# Patient Record
Sex: Female | Born: 1955
Health system: Southern US, Community
[De-identification: ages and names within clinical notes are randomized; demographics above are authoritative.]

## PROBLEM LIST (undated history)

## (undated) DIAGNOSIS — I639 Cerebral infarction, unspecified: Secondary | ICD-10-CM

## (undated) DIAGNOSIS — E119 Type 2 diabetes mellitus without complications: Secondary | ICD-10-CM

## (undated) DIAGNOSIS — R42 Dizziness and giddiness: Secondary | ICD-10-CM

## (undated) DIAGNOSIS — E785 Hyperlipidemia, unspecified: Secondary | ICD-10-CM

## (undated) DIAGNOSIS — I10 Essential (primary) hypertension: Secondary | ICD-10-CM

## (undated) DIAGNOSIS — J45909 Unspecified asthma, uncomplicated: Secondary | ICD-10-CM

## (undated) DIAGNOSIS — I671 Cerebral aneurysm, nonruptured: Secondary | ICD-10-CM

## (undated) HISTORY — DX: Type 2 diabetes mellitus without complications: E11.9

## (undated) HISTORY — PX: TUBAL LIGATION: SHX77

## (undated) HISTORY — PX: KNEE SURGERY: SHX244

## (undated) HISTORY — PX: TONSILLECTOMY AND ADENOIDECTOMY: SHX28

## (undated) HISTORY — PX: TONSILLECTOMY: SUR1361

## (undated) HISTORY — DX: Essential (primary) hypertension: I10

## (undated) HISTORY — DX: Cerebral aneurysm, nonruptured: I67.1

---

## 1998-02-22 ENCOUNTER — Emergency Department (HOSPITAL_COMMUNITY): Admission: EM | Admit: 1998-02-22 | Discharge: 1998-02-22 | Payer: Self-pay | Admitting: Emergency Medicine

## 1998-09-05 ENCOUNTER — Emergency Department (HOSPITAL_COMMUNITY): Admission: EM | Admit: 1998-09-05 | Discharge: 1998-09-05 | Payer: Self-pay | Admitting: Emergency Medicine

## 1999-07-18 ENCOUNTER — Emergency Department (HOSPITAL_COMMUNITY): Admission: EM | Admit: 1999-07-18 | Discharge: 1999-07-19 | Payer: Self-pay | Admitting: Emergency Medicine

## 1999-11-29 ENCOUNTER — Emergency Department (HOSPITAL_COMMUNITY): Admission: EM | Admit: 1999-11-29 | Discharge: 1999-11-29 | Payer: Self-pay | Admitting: Emergency Medicine

## 2002-08-02 LAB — HM PAP SMEAR: HM PAP: NORMAL

## 2005-01-12 ENCOUNTER — Other Ambulatory Visit: Payer: Self-pay

## 2005-03-07 ENCOUNTER — Ambulatory Visit: Payer: Self-pay | Admitting: General Practice

## 2006-06-03 ENCOUNTER — Emergency Department: Payer: Self-pay | Admitting: Emergency Medicine

## 2006-10-20 ENCOUNTER — Emergency Department: Payer: Self-pay | Admitting: Emergency Medicine

## 2006-10-21 ENCOUNTER — Inpatient Hospital Stay: Payer: Self-pay | Admitting: Internal Medicine

## 2007-02-01 ENCOUNTER — Emergency Department: Payer: Self-pay | Admitting: Internal Medicine

## 2007-03-06 ENCOUNTER — Ambulatory Visit: Payer: Self-pay | Admitting: Family Medicine

## 2007-04-15 ENCOUNTER — Emergency Department: Payer: Self-pay | Admitting: Emergency Medicine

## 2007-04-16 ENCOUNTER — Other Ambulatory Visit: Payer: Self-pay

## 2007-04-28 DIAGNOSIS — I671 Cerebral aneurysm, nonruptured: Secondary | ICD-10-CM

## 2007-04-28 HISTORY — PX: CRANIOTOMY: SHX93

## 2007-04-28 HISTORY — DX: Cerebral aneurysm, nonruptured: I67.1

## 2008-05-01 IMAGING — CR DG CHEST 2V
1 series · 2 of 2 positions shown · non-contrast
Comparison: none

REASON FOR EXAM: fever
COMMENTS:

[Series 1: view not recorded · 0.17mm/px · 2 of 2 slices shown]
[im 1/2]
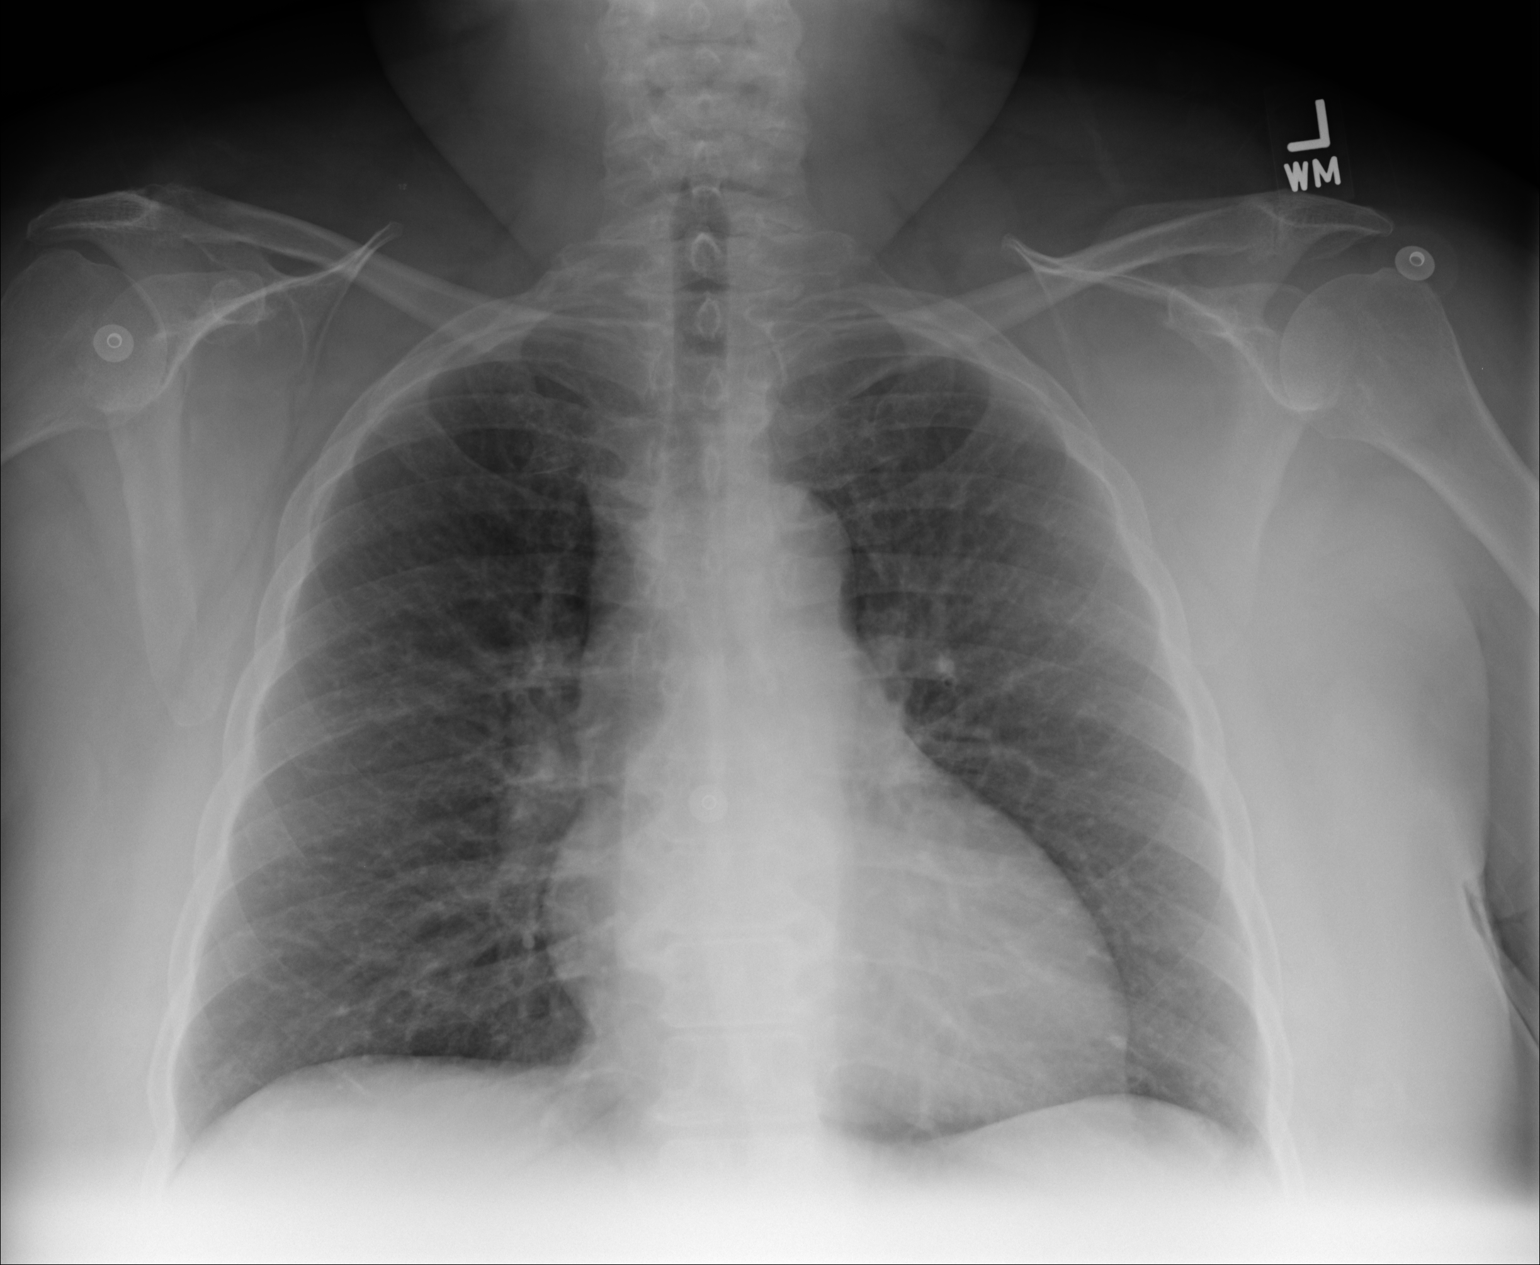
[im 2/2]
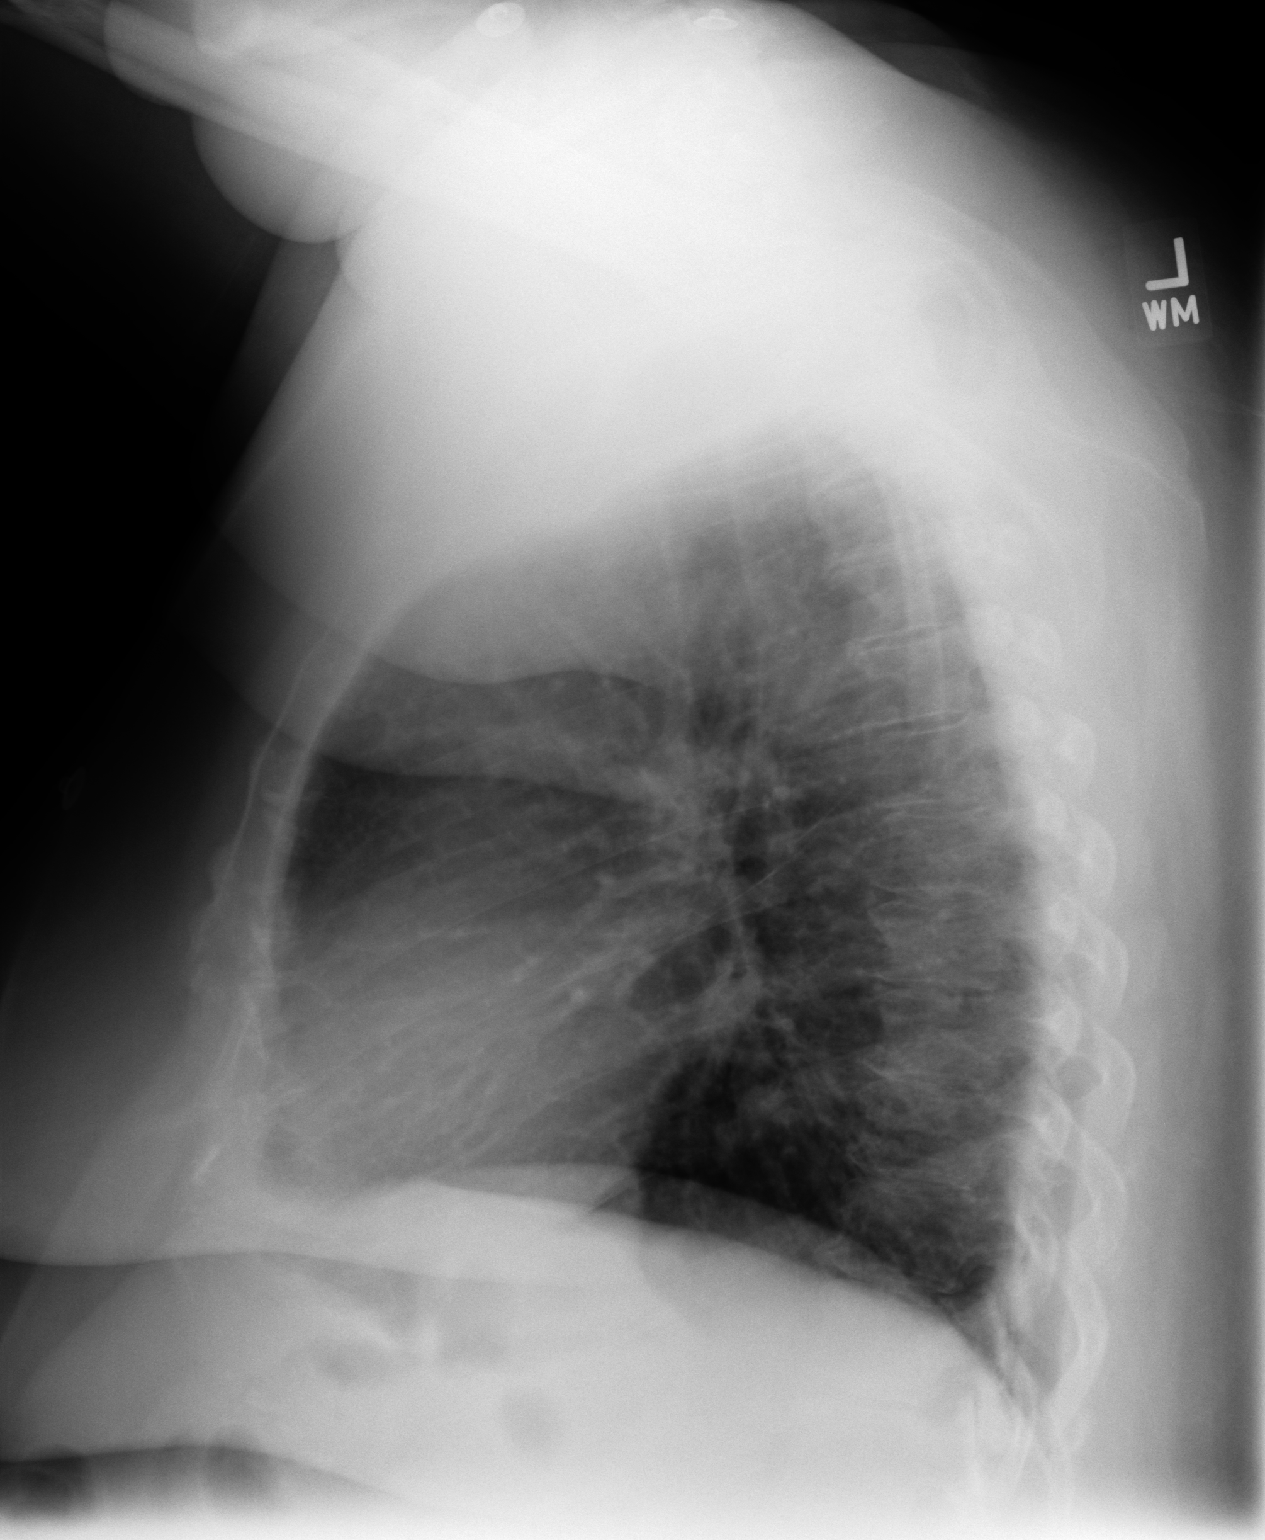

[2 of 2 positions shown; findings below may reference images not displayed]

PROCEDURE:     DXR - DXR CHEST PA (OR AP) AND LATERAL  - February 01, 2007  [DATE]

RESULT:     Comparison is made to a prior exam of 03/04/2005. The lung fields
are clear.  No pneumonia, pneumothorax or pleural effusion is seen.  Heart
size is within normal limits. No acute bony abnormalities are seen.
IMPRESSION: No acute changes are identified.

## 2008-09-12 ENCOUNTER — Ambulatory Visit: Payer: Self-pay | Admitting: Family Medicine

## 2010-01-29 LAB — HM MAMMOGRAPHY: HM Mammogram: NEGATIVE

## 2010-02-25 ENCOUNTER — Emergency Department: Payer: Self-pay | Admitting: Internal Medicine

## 2011-06-05 ENCOUNTER — Emergency Department: Payer: Self-pay | Admitting: Emergency Medicine

## 2011-09-14 LAB — HM DIABETES EYE EXAM

## 2013-01-18 ENCOUNTER — Ambulatory Visit: Payer: Self-pay | Admitting: Family Medicine

## 2013-01-22 ENCOUNTER — Emergency Department: Payer: Self-pay | Admitting: Emergency Medicine

## 2013-10-28 ENCOUNTER — Emergency Department: Payer: Self-pay | Admitting: Emergency Medicine

## 2013-10-29 LAB — CBC
HCT: 45.6 %
HGB: 14.6 g/dL
MCH: 27.8 pg
MCHC: 31.9 g/dL — ABNORMAL LOW
MCV: 87 fL
Platelet: 202 x10 3/mm 3
RBC: 5.24 X10 6/mm 3 — ABNORMAL HIGH
RDW: 16 % — ABNORMAL HIGH
WBC: 12.4 x10 3/mm 3 — ABNORMAL HIGH

## 2013-10-29 LAB — URINALYSIS, COMPLETE
Bacteria: NONE SEEN
Bilirubin,UR: NEGATIVE
Blood: NEGATIVE
Glucose,UR: NEGATIVE mg/dL
Ketone: NEGATIVE
Leukocyte Esterase: NEGATIVE
Nitrite: NEGATIVE
Ph: 6
Protein: NEGATIVE
RBC,UR: 1 /HPF
Specific Gravity: 1.019
Squamous Epithelial: 4
WBC UR: 5 /HPF

## 2013-10-29 LAB — BASIC METABOLIC PANEL WITH GFR
Anion Gap: 5 — ABNORMAL LOW
BUN: 21 mg/dL — ABNORMAL HIGH
Calcium, Total: 9.4 mg/dL
Chloride: 105 mmol/L
Co2: 31 mmol/L
Creatinine: 0.71 mg/dL
EGFR (African American): >60
EGFR (Non-African Amer.): >60
Glucose: 117 mg/dL — ABNORMAL HIGH
Osmolality: 285
Potassium: 3.5 mmol/L
Sodium: 141 mmol/L

## 2013-10-29 LAB — TROPONIN I
Troponin-I: <0.02 ng/mL
Troponin-I: <0.02 ng/mL

## 2013-10-31 LAB — URINE CULTURE

## 2014-03-16 ENCOUNTER — Emergency Department: Payer: Self-pay | Admitting: Emergency Medicine

## 2014-07-07 ENCOUNTER — Emergency Department: Payer: Self-pay | Admitting: Emergency Medicine

## 2014-07-31 LAB — BASIC METABOLIC PANEL
BUN: 18 mg/dL (ref 4–21)
CREATININE: 0.7 mg/dL (ref 0.5–1.1)
Glucose: 104 mg/dL
POTASSIUM: 4.6 mmol/L (ref 3.4–5.3)
SODIUM: 143 mmol/L (ref 137–147)

## 2014-07-31 LAB — LIPID PANEL
Cholesterol: 195 mg/dL (ref 0–200)
HDL: 40 mg/dL (ref 35–70)
LDL Cholesterol: 135 mg/dL
Triglycerides: 100 mg/dL (ref 40–160)

## 2014-07-31 LAB — HEMOGLOBIN A1C: HEMOGLOBIN A1C: 6.8 % — AB (ref 4.0–6.0)

## 2014-08-18 ENCOUNTER — Ambulatory Visit: Payer: Self-pay | Admitting: Podiatry

## 2014-09-08 ENCOUNTER — Encounter: Payer: Self-pay | Admitting: Podiatry

## 2014-09-08 ENCOUNTER — Ambulatory Visit: Payer: Self-pay

## 2014-09-08 NOTE — Progress Notes (Signed)
This encounter was created in error - please disregard.

## 2014-09-25 ENCOUNTER — Ambulatory Visit: Payer: Self-pay | Admitting: Podiatry

## 2014-10-01 ENCOUNTER — Emergency Department
Admit: 2014-10-01 | Disposition: A | Discharge status: Other Acute Inpatient Hospital | Payer: Self-pay | Admitting: Emergency Medicine

## 2014-10-01 DIAGNOSIS — I610 Nontraumatic intracerebral hemorrhage in hemisphere, subcortical: Secondary | ICD-10-CM | POA: Insufficient documentation

## 2014-10-01 DIAGNOSIS — I152 Hypertension secondary to endocrine disorders: Secondary | ICD-10-CM | POA: Insufficient documentation

## 2014-10-01 DIAGNOSIS — E1159 Type 2 diabetes mellitus with other circulatory complications: Secondary | ICD-10-CM | POA: Insufficient documentation

## 2014-10-01 DIAGNOSIS — I10 Essential (primary) hypertension: Secondary | ICD-10-CM | POA: Insufficient documentation

## 2014-10-01 DIAGNOSIS — E119 Type 2 diabetes mellitus without complications: Secondary | ICD-10-CM | POA: Insufficient documentation

## 2014-10-01 LAB — CBC
HCT: 45.6 % (ref 35.0–47.0)
HGB: 14.6 g/dL (ref 12.0–16.0)
MCH: 28.3 pg (ref 26.0–34.0)
MCHC: 32.1 g/dL (ref 32.0–36.0)
MCV: 88 fL (ref 80–100)
Platelet: 211 10*3/uL (ref 150–440)
RBC: 5.16 10*6/uL (ref 3.80–5.20)
RDW: 15.8 % — ABNORMAL HIGH (ref 11.5–14.5)
WBC: 9.2 10*3/uL (ref 3.6–11.0)

## 2014-10-01 LAB — URINALYSIS, COMPLETE
BLOOD: NEGATIVE
Bacteria: NONE SEEN
Bilirubin,UR: NEGATIVE
Glucose,UR: NEGATIVE mg/dL (ref 0–75)
Ketone: NEGATIVE
Leukocyte Esterase: NEGATIVE
NITRITE: NEGATIVE
PROTEIN: NEGATIVE
Ph: 7 (ref 4.5–8.0)
RBC,UR: 1 /HPF (ref 0–5)
Specific Gravity: 1.017 (ref 1.003–1.030)
WBC UR: 2 /HPF (ref 0–5)

## 2014-10-01 LAB — COMPREHENSIVE METABOLIC PANEL
ALBUMIN: 3.6 g/dL
ALK PHOS: 57 U/L
ALT: 29 U/L
Anion Gap: 9 (ref 7–16)
BUN: 23 mg/dL — ABNORMAL HIGH
Bilirubin,Total: 0.6 mg/dL
CALCIUM: 9.5 mg/dL
Chloride: 100 mmol/L — ABNORMAL LOW
Co2: 31 mmol/L
Creatinine: 0.81 mg/dL
GLUCOSE: 124 mg/dL — AB
POTASSIUM: 3.7 mmol/L
SGOT(AST): 33 U/L
Sodium: 140 mmol/L
Total Protein: 7.7 g/dL

## 2014-10-01 LAB — APTT: ACTIVATED PTT: 28.3 s (ref 23.6–35.9)

## 2014-10-01 LAB — PROTIME-INR
INR: 1
PROTHROMBIN TIME: 12.9 s

## 2014-11-04 DIAGNOSIS — E7800 Pure hypercholesterolemia, unspecified: Secondary | ICD-10-CM | POA: Insufficient documentation

## 2014-11-04 DIAGNOSIS — E1169 Type 2 diabetes mellitus with other specified complication: Secondary | ICD-10-CM | POA: Insufficient documentation

## 2014-11-04 DIAGNOSIS — M545 Low back pain, unspecified: Secondary | ICD-10-CM | POA: Insufficient documentation

## 2014-11-04 DIAGNOSIS — R051 Acute cough: Secondary | ICD-10-CM | POA: Insufficient documentation

## 2014-11-04 DIAGNOSIS — R05 Cough: Secondary | ICD-10-CM | POA: Insufficient documentation

## 2014-11-04 DIAGNOSIS — I619 Nontraumatic intracerebral hemorrhage, unspecified: Secondary | ICD-10-CM | POA: Insufficient documentation

## 2014-11-04 DIAGNOSIS — M79673 Pain in unspecified foot: Secondary | ICD-10-CM | POA: Insufficient documentation

## 2014-11-04 DIAGNOSIS — J449 Chronic obstructive pulmonary disease, unspecified: Secondary | ICD-10-CM | POA: Insufficient documentation

## 2014-11-04 DIAGNOSIS — R059 Cough, unspecified: Secondary | ICD-10-CM | POA: Insufficient documentation

## 2014-11-04 DIAGNOSIS — E559 Vitamin D deficiency, unspecified: Secondary | ICD-10-CM | POA: Insufficient documentation

## 2014-11-04 DIAGNOSIS — E669 Obesity, unspecified: Secondary | ICD-10-CM | POA: Insufficient documentation

## 2014-11-04 DIAGNOSIS — M25569 Pain in unspecified knee: Secondary | ICD-10-CM | POA: Insufficient documentation

## 2014-11-26 ENCOUNTER — Ambulatory Visit: Payer: BLUE CROSS/BLUE SHIELD | Admitting: Occupational Therapy

## 2014-11-26 ENCOUNTER — Encounter: Payer: Self-pay | Admitting: Occupational Therapy

## 2014-11-26 ENCOUNTER — Ambulatory Visit: Payer: BLUE CROSS/BLUE SHIELD | Attending: Family Medicine | Admitting: Physical Therapy

## 2014-11-26 ENCOUNTER — Encounter: Payer: Self-pay | Admitting: Physical Therapy

## 2014-11-26 DIAGNOSIS — I69398 Other sequelae of cerebral infarction: Secondary | ICD-10-CM | POA: Diagnosis present

## 2014-11-26 DIAGNOSIS — R262 Difficulty in walking, not elsewhere classified: Secondary | ICD-10-CM | POA: Diagnosis not present

## 2014-11-26 DIAGNOSIS — IMO0002 Reserved for concepts with insufficient information to code with codable children: Secondary | ICD-10-CM

## 2014-11-26 NOTE — Therapy (Signed)
[place] MAIN [facility] SERVICES [address] [place], [place], [address] Phone: [phone]   Fax:  [phone]  Physical Therapy Treatment  Patient Details  Name: Kristin Mora MRN: [id number] Date of Birth: [birth year] Referring Provider:  Lawana Pai, MD  Encounter Date: 11/26/2014      PT End of Session - 11/26/14 1012    Visit Number 1   Number of Visits 1   Date for PT Re-Evaluation 11/26/14   PT Start Time 0858   PT Stop Time 0959   PT Time Calculation (min) 61 min   Activity Tolerance Patient tolerated treatment well   Behavior During Therapy [facility] for tasks assessed/performed      Past Medical History  Diagnosis Date  . Brain aneurysm 04/2007  . Hypertension     controlled with medication  . Diabetes mellitus without complication     controlled;    Past Surgical History  Procedure Laterality Date  . Tubal ligation    . Tonsillectomy and adenoidectomy    . Craniotomy  04/2007  . Knee surgery      There were no vitals filed for this visit.  Visit Diagnosis:  Difficulty walking - Plan: PT plan of care cert/re-cert      Subjective Assessment - 11/26/14 0917    Subjective 59 yo Female reports having a slight stroke (thalamic hemorrhage) in early April 2016; Patient was in the hospital for a few days and was discharged home. Patient reports getting home health PT (ended a while ago); She reports having a hard time writing and combing hair but reports that she has been able to do that since then; She reports continued numbness in LUE from shoulder to hand and along left thigh; she also reports some numbness in left side of face; she also reports no sensation in left 5th toe;    Limitations Other (comment)  numbness, fatigue   How long can you walk comfortably? She was walking with a cane at home; She has been walking without cane for 3 weeks; walks for about 6-7 min and she tries to do that every day;    Patient Stated  Goals "Get stronger", reduce numbness in LUE; improve standing/walking tolerance; "get back to work"   Currently in Pain? No/denies            [facility] PT Assessment - 11/26/14 6962    Assessment   Medical Diagnosis s/p thalamic CVA 09/2014   Onset Date/Surgical Date 09/26/14   Hand Dominance Left   Prior Therapy reports getting acute care and home health PT for this condition; denies any outpatient PT for this condition;    Precautions   Precautions Fall   Restrictions   Weight Bearing Restrictions No   Balance Screen   Has the patient fallen in the past 6 months No   Has the patient had a decrease in activity level because of a fear of falling?  No   Is the patient reluctant to leave their home because of a fear of falling?  No   Home Environment   Additional Comments Patient lives in a single family home, 3 steps to enter; lives with her daughter;    Prior Function   Level of Independence Independent with gait;Independent with transfers;Independent with community mobility without device   Vocation Full time employment  worked at [employer] reports that she has to stand all day; lifts 20-30#;    Cognition   Overall  Cognitive Status Within Functional Limits for tasks assessed   Observation/Other Assessments   Skin Integrity intact   Sensation   Additional Comments reports numbness along left face, LUE shoulder to hand and top of left thigh; also reports numbness in 5th digit with left foot;    Coordination   Finger Nose Finger Test accurate bilaterally with eyes closed   Functional Tests   Functional tests Sit to Stand   Sit to Stand   Comments 5 times sit<>stand, 14.94 sec, (>60 sec, <15 sec indicates low fall risk);   Posture/Postural Control   Posture Comments sits with mild slumped posture   ROM / Strength   AROM / PROM / Strength AROM;Strength   AROM   Overall AROM Comments BUE and BLE AROM is [facility]   Strength   Overall Strength Comments BUE  and BLE gross strength is WFL, grossly 4/5   Right Hand Grip (lbs) 52   Left Hand Grip (lbs) 55   Transfers   Transfers Sit to Stand   Sit to Stand 7: Independent;Without upper extremity assist  from chair;   Ambulation/Gait   Gait velocity 10 meter walk (0.83 m/s) without AD; limited community ambulator;   6 Minute Walk- Baseline   6 Minute Walk- Baseline yes   BP (mmHg) 149/75 mmHg   HR (bpm) 76   02 Sat (%RA) 93 %   6 Minute walk- Post Test   6 Minute Walk Post Test yes   BP (mmHg) 141/70 mmHg   HR (bpm) 90   02 Sat (%RA) 99 %   Modified Borg Scale for Dyspnea 2- Mild shortness of breath   Perceived Rate of Exertion (Borg) 11- Fairly light   6 minute walk test results    Aerobic Endurance Distance Walked 1100   Endurance additional comments slow pace, but no rest breaks;   Balance   Balance Assessed Yes   Static Standing Balance   Static Standing - Balance Support No upper extremity supported   Static Standing - Level of Assistance 7: Independent   Static Standing Balance -  Activities  --  able to stand with feet together eyes open/closed 10 sec                              PT Education - 11/26/14 1011    Education provided Yes   Education Details educated patient in walking program and also on different sensory integration techniques to reduce numbness in LUE/LLE;   Person(s) Educated Patient   Methods Explanation;Demonstration;Verbal cues   Comprehension Verbalized understanding;Returned demonstration;Verbal cues required;Tactile cues required             PT Long Term Goals - 11/26/14 1151    PT LONG TERM GOAL #1   Title Patient will verbalize independence in HEP for safety with ADLs and return to PLOF.   Time 1   Period Weeks   Status Achieved               Plan - 11/26/14 1142    Clinical Impression Statement 59 yo Female s/p thalamic hemorrhage with left sided weakness and numbness in LUE; Patient exhibits good UE/LE  strength. She also tested as a low fall risk demonstrating good balance control and sit<>Stand ability. She reports being modified independent in all self care ADLs. Patient also demonstrates good endurance with 6 min walk test. While she is below age group norms, she is walking a community  ambulator distance; Patient  was educated in sensory integration techniques and a home walking program. She is functioning independently and does not demonstrate any additional needs for skilled PT intervention at this time.   Pt will benefit from skilled therapeutic intervention in order to improve on the following deficits Impaired sensation   Rehab Potential Good   Clinical Impairments Affecting Rehab Potential co-morbidities   PT Treatment/Interventions Therapeutic exercise   Consulted and Agree with Plan of Care Patient        Problem List Patient Active Problem List   Diagnosis Date Noted  . Chronic airway obstruction 11/04/2014  . Cough 11/04/2014  . Avitaminosis D 11/04/2014  . Adiposity 11/04/2014  . Hypercholesteremia 11/04/2014  . BP (high blood pressure) 11/04/2014  . LBP (low back pain) 11/04/2014  . Gonalgia 11/04/2014  . Heel pain 11/04/2014  . Hemorrhagic cerebrovascular accident (CVA) 11/04/2014    Hopkins,Keary Hanak, PT, DPT 11/26/2014, 11:55 AM  [place] MAIN [facility] SERVICES [address] [place], [place], [address] Phone: [phone]   Fax:  [phone]

## 2014-11-26 NOTE — Patient Instructions (Signed)

## 2014-11-27 NOTE — Therapy (Signed)
[place] MAIN [facility] SERVICES [address] [place], [place], [address] Phone: [phone]   Fax:  [phone]  Occupational Therapy Evaluation/Discharge  Patient Details  Name: Kristin Mora MRN: [id number] Date of Birth: 24-Jul-1955 Referring Provider:  Lawana Pai, MD  Encounter Date: 11/26/2014      OT End of Session - 11/26/14 1535    Visit Number 1   Number of Visits 1   OT Start Time 1000   OT Stop Time 7782   OT Time Calculation (min) 49 min   Activity Tolerance Patient tolerated treatment well;No increased pain   Behavior During Therapy [facility] for tasks assessed/performed      Past Medical History  Diagnosis Date  . Brain aneurysm 04/2007  . Hypertension     controlled with medication  . Diabetes mellitus without complication     controlled;    Past Surgical History  Procedure Laterality Date  . Tubal ligation    . Tonsillectomy and adenoidectomy    . Craniotomy  04/2007  . Knee surgery      There were no vitals filed for this visit.  Visit Diagnosis:  Lack of coordination due to stroke - Plan: Ot plan of care cert/re-cert      Subjective Assessment - 11/26/14 1531    Subjective  Patient reports she is doing well, she has been able to progress to being able to complete her basic self care, ADLs and IADL tasks around the home without difficulty.  She has not yet been able to return to work and reports she is concerned about standing on her feet for 8-10 hours a day.   She reports she no longer drives and her daughter takes her where she needs to go.    Patient Stated Goals Patient reports she wants to be completely independent and return to work without any difficulties.    Currently in Pain? No/denies   Multiple Pain Sites No           [facility] OT Assessment - 11/26/14 1542    Assessment   Diagnosis  hemorrhage   Onset Date 09/30/14   Prior Therapy had home health therapy   Precautions   Precautions Fall   Restrictions   Weight Bearing Restrictions No   Home  Environment   Family/patient expects to be discharged to: Private residence   Living Arrangements Children   Available Help at Discharge Family   Type of [place] One level   Alternate Level Stairs - Number of Steps 3   Bathroom Shower/Tub Tub/Shower unit   Biochemist, clinical Yes   How accessible Accessible via walker   Lives With Family;Daughter   Prior Function   Level of Independence Independent   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 Requirements she reports standing 8-10  hours a day and productivity standards   ADL   Eating/Feeding Independent   Grooming Independent   Programmer, systems - Water engineer -  Producer, television/film/video   IADL   Prior Level of Manufacturing systems engineer for transportation   Prior Level of Function  Light Housekeeping independent   Light Housekeeping Maintains house alone or with occasional assistance   Prior Level of Function Meal Prep independent   Meal Prep Plans, prepares and serves adequate meals independently   Prior Level of Function Community Mobility independent   Community Mobility Relies on family or friends for transportation   Prior Level of Function Financial Management independent   Physiological scientist financial matters independently (budgets, writes checks, pays rent, bills goes to bank), collects and keeps track of income   Mobility   Mobility Status Independent   Mobility Status Comments has a cane at home but has not been using    Written Expression   Dominant Hand Left    Handwriting 100% legible   Vision - History   Baseline Vision No visual deficits   Cognition   Overall Cognitive Status Within Functional Limits for tasks assessed                  OT Treatments/Exercises (OP) - 11/27/14 0001    ADLs   Writing Handwriting samples taken and encouraged patient to work towards handwriting at home for dexterity and speed.    Fine Motor Coordination   Other Fine Motor Exercises Patient was educated on [facility] acts with small objects, coins, beans, nuts/bolts for in hand manipulation, speed and dexterity.  Gross motor coordination with tennis ball for drills for toss/catch, bounce/catch and hand to hand toss.                     OT Long Term Goals - 11/26/14 1539    OT LONG TERM GOAL #1   Title Patient will demo understanding of HEP for coordination and will perform independently.   Baseline no home program currently for fine motor.   Time 1   Period Days   Status Achieved               Plan - 11/27/14 1037    Clinical Impression Statement Pt is a 59 yo female who suffered a stroke in april of 2016.  She lives at home with her daughter in a one story home and was previously independent with all self care tasks and was working full time at a [employer].  She presents with some mild decreased sensation on the left UE from the wrist to the shoulder with some numbness reported.  She has returned to independent ADL tasks and IADL tasks except return to work.  She was educated on a HEP program for coordination for the left UE and does not appear to have any further OT skilled needs at this time.  Patient seen for 1 visit only.        Problem List Patient Active Problem List   Diagnosis Date Noted  . Chronic airway obstruction 11/04/2014  . Cough 11/04/2014  . Avitaminosis D 11/04/2014  . Adiposity 11/04/2014  . Hypercholesteremia 11/04/2014  . BP (high blood pressure) 11/04/2014  . LBP (low back pain) 11/04/2014  .  Gonalgia 11/04/2014  . Heel pain 11/04/2014  . Hemorrhagic cerebrovascular accident (CVA) 11/04/2014   [name] T Tomasita Morrow, OTR/L, CLT [name],[name] 11/27/2014, 10:41 AM  [place] [address] [place], [place], [address] Phone: [phone]   Fax:  [phone]

## 2015-01-08 ENCOUNTER — Ambulatory Visit: Payer: Self-pay | Admitting: Family Medicine

## 2015-01-09 ENCOUNTER — Ambulatory Visit (INDEPENDENT_AMBULATORY_CARE_PROVIDER_SITE_OTHER): Payer: BLUE CROSS/BLUE SHIELD | Admitting: Family Medicine

## 2015-01-09 ENCOUNTER — Encounter: Payer: Self-pay | Admitting: Family Medicine

## 2015-01-09 VITALS — BP 132/74 | HR 66 | Temp 98.3°F | Resp 16 | Wt 290.4 lb

## 2015-01-09 DIAGNOSIS — E119 Type 2 diabetes mellitus without complications: Secondary | ICD-10-CM | POA: Diagnosis not present

## 2015-01-09 DIAGNOSIS — E7800 Pure hypercholesterolemia, unspecified: Secondary | ICD-10-CM

## 2015-01-09 DIAGNOSIS — I10 Essential (primary) hypertension: Secondary | ICD-10-CM | POA: Diagnosis not present

## 2015-01-09 LAB — POCT GLYCOSYLATED HEMOGLOBIN (HGB A1C): Hemoglobin A1C: 6

## 2015-01-09 NOTE — Patient Instructions (Addendum)
Continuing walking for exercise. Try to increase by one minute a week up to 30 minutes daily

## 2015-01-09 NOTE — Progress Notes (Signed)
Subjective:     Patient ID: Kristin Mora, female   DOB: 1955/08/12, 59 y.o.   MRN: [id number]  HPI  Chief Complaint  Patient presents with  . Hypertension    patient presents for follow up visit on 10/09/14 patient was started on Losartan Potassium 38m and to con tinue Amlodipine 564m Patient has had good compliance,tolerance and symptom control on medication, lst blood pressure in office 144/82  . Diabetes    follow up from 10/09/14 patient advised to continue Metformin HC 50039mpatient reports fasting blood sugars at home have been ranging from 100-120. Last HgbA1C was 07/31/14 at 6.8%  . Hyperlipidemia    follow up from 10/09/14 patient started Atorvastatin 32m60mtates she has completed therapy after her stroke. Reports residual numbness around her mouth. Has f/u with neurology pending regarding return to work per her report. Currently exercising by walking 10-15 minutes daily.   Review of Systems  Constitutional:       Weight has decreased by 9 # since the Spring.  Respiratory: Negative for shortness of breath.   Cardiovascular: Negative for chest pain and palpitations.       Objective:   Physical Exam  Constitutional: She appears well-developed and well-nourished. No distress.  Lungs: clear Heart: RRR without murmur Lower extremities: no edema; pedal pulses intact, sensation to monofilament intact, no wounds noted.     Assessment:    1. Type 2 diabetes mellitus without complication - POCT glycosylated hemoglobin (Hb A1C) - Comprehensive metabolic panel  2. Hypercholesteremia - Lipid panel  3. Essential (primary) hypertensio - Comprehensive metabolic panel    Plan:    Slowly increase daily walking to 30 minutes daily.

## 2015-01-10 LAB — COMPREHENSIVE METABOLIC PANEL
A/G RATIO: 1.2 (ref 1.1–2.5)
ALT: 31 IU/L (ref 0–32)
AST: 22 IU/L (ref 0–40)
Albumin: 4.1 g/dL (ref 3.6–4.8)
Alkaline Phosphatase: 72 IU/L (ref 39–117)
BUN/Creatinine Ratio: 24 (ref 11–26)
BUN: 17 mg/dL (ref 8–27)
Bilirubin Total: 0.5 mg/dL (ref 0.0–1.2)
CO2: 27 mmol/L (ref 18–29)
Calcium: 9.9 mg/dL (ref 8.7–10.3)
Chloride: 102 mmol/L (ref 97–108)
Creatinine, Ser: 0.71 mg/dL (ref 0.57–1.00)
GFR calc Af Amer: 106 mL/min/{1.73_m2} (ref >59)
GFR calc non Af Amer: 92 mL/min/{1.73_m2} (ref >59)
Globulin, Total: 3.4 g/dL (ref 1.5–4.5)
Glucose: 108 mg/dL — ABNORMAL HIGH (ref 65–99)
POTASSIUM: 4.4 mmol/L (ref 3.5–5.2)
SODIUM: 145 mmol/L — AB (ref 134–144)
Total Protein: 7.5 g/dL (ref 6.0–8.5)

## 2015-01-10 LAB — LIPID PANEL
Chol/HDL Ratio: 3.2 ratio units (ref 0.0–4.4)
Cholesterol, Total: 121 mg/dL (ref 100–199)
HDL: 38 mg/dL — ABNORMAL LOW (ref >39)
LDL Calculated: 67 mg/dL (ref 0–99)
TRIGLYCERIDES: 81 mg/dL (ref 0–149)
VLDL CHOLESTEROL CAL: 16 mg/dL (ref 5–40)

## 2015-01-12 ENCOUNTER — Telehealth: Payer: Self-pay

## 2015-01-12 NOTE — Telephone Encounter (Signed)
-----   Message from Carmon Ginsberg, [place] sent at 01/12/2015  7:47 AM EDT ----- Labs look good!

## 2015-01-12 NOTE — Telephone Encounter (Signed)
[id number] 

## 2015-01-13 NOTE — Telephone Encounter (Signed)
Patient has been advised

## 2015-01-14 ENCOUNTER — Telehealth: Payer: Self-pay

## 2015-01-14 NOTE — Telephone Encounter (Signed)
Patient was advised of lab report attached below on 01/12/15. KW

## 2015-01-14 NOTE — Telephone Encounter (Signed)
Left message to call back  

## 2015-01-16 ENCOUNTER — Telehealth: Payer: Self-pay

## 2015-01-19 ENCOUNTER — Emergency Department
Admission: EM | Admit: 2015-01-19 | Discharge: 2015-01-19 | Disposition: A | Discharge status: Home / Self Care | Payer: BLUE CROSS/BLUE SHIELD | Attending: Emergency Medicine | Admitting: Emergency Medicine

## 2015-01-19 ENCOUNTER — Other Ambulatory Visit: Payer: Self-pay

## 2015-01-19 ENCOUNTER — Emergency Department: Payer: BLUE CROSS/BLUE SHIELD

## 2015-01-19 ENCOUNTER — Encounter: Payer: Self-pay | Admitting: Emergency Medicine

## 2015-01-19 DIAGNOSIS — Z87891 Personal history of nicotine dependence: Secondary | ICD-10-CM | POA: Insufficient documentation

## 2015-01-19 DIAGNOSIS — I10 Essential (primary) hypertension: Secondary | ICD-10-CM | POA: Insufficient documentation

## 2015-01-19 DIAGNOSIS — Z91040 Latex allergy status: Secondary | ICD-10-CM | POA: Insufficient documentation

## 2015-01-19 DIAGNOSIS — R202 Paresthesia of skin: Secondary | ICD-10-CM | POA: Insufficient documentation

## 2015-01-19 DIAGNOSIS — E119 Type 2 diabetes mellitus without complications: Secondary | ICD-10-CM | POA: Insufficient documentation

## 2015-01-19 DIAGNOSIS — R42 Dizziness and giddiness: Secondary | ICD-10-CM | POA: Insufficient documentation

## 2015-01-19 LAB — URINALYSIS COMPLETE WITH MICROSCOPIC (ARMC ONLY)
Bacteria, UA: NONE SEEN
Bilirubin Urine: NEGATIVE
Glucose, UA: NEGATIVE mg/dL
Hgb urine dipstick: NEGATIVE
KETONES UR: NEGATIVE mg/dL
Leukocytes, UA: NEGATIVE
NITRITE: NEGATIVE
PH: 6 (ref 5.0–8.0)
Protein, ur: NEGATIVE mg/dL
RBC / HPF: NONE SEEN RBC/hpf (ref 0–5)
SPECIFIC GRAVITY, URINE: 1.016 (ref 1.005–1.030)

## 2015-01-19 LAB — BASIC METABOLIC PANEL
Anion gap: 11 (ref 5–15)
BUN: 28 mg/dL — ABNORMAL HIGH (ref 6–20)
CALCIUM: 9.7 mg/dL (ref 8.9–10.3)
CO2: 29 mmol/L (ref 22–32)
Chloride: 99 mmol/L — ABNORMAL LOW (ref 101–111)
Creatinine, Ser: 1.17 mg/dL — ABNORMAL HIGH (ref 0.44–1.00)
GFR calc Af Amer: 57 mL/min — ABNORMAL LOW (ref >60)
GFR, EST NON AFRICAN AMERICAN: 49 mL/min — AB (ref >60)
Glucose, Bld: 93 mg/dL (ref 65–99)
POTASSIUM: 4 mmol/L (ref 3.5–5.1)
SODIUM: 139 mmol/L (ref 135–145)

## 2015-01-19 LAB — CBC
HCT: 44.4 % (ref 35.0–47.0)
Hemoglobin: 14.4 g/dL (ref 12.0–16.0)
MCH: 28.4 pg (ref 26.0–34.0)
MCHC: 32.5 g/dL (ref 32.0–36.0)
MCV: 87.2 fL (ref 80.0–100.0)
Platelets: 228 10*3/uL (ref 150–440)
RBC: 5.09 MIL/uL (ref 3.80–5.20)
RDW: 16 % — ABNORMAL HIGH (ref 11.5–14.5)
WBC: 13 10*3/uL — AB (ref 3.6–11.0)

## 2015-01-19 LAB — GLUCOSE, CAPILLARY: Glucose-Capillary: 70 mg/dL (ref 65–99)

## 2015-01-19 MED ORDER — SODIUM CHLORIDE 0.9 % IV SOLN: Freq: Once | INTRAVENOUS | Status: DC

## 2015-01-19 NOTE — ED Notes (Signed)
Pt states she was at work today and around [place] she started having nausea and feeling lightheaded and dizzy, states she did not have any vomiting, states she still has dizziness and feels weak, states today was her first day back at work after having a CVA in April, states she still has numbness in left arm from previous stroke, denies any new numbness, smile symmetrical, equal grip strength

## 2015-01-19 NOTE — Progress Notes (Signed)
   01/19/15 1700  Clinical Encounter Type  Visited With Patient and family together  Visit Type Spiritual support  Spiritual Encounters  Spiritual Needs Prayer  Stress Factors  Patient Stress Factors Health changes   Faith: [facility] [employer] Family: Daughter bedside, other daughter is not in hospital but family is supportive she says Status: Possible active ED Triage Syncope Age/Sex: female 20yrs Visit Assessment: The patient shared that she is still weak but she is feeling better. The chaplain gave encouraging words and introduced pastoral care.  Pastoral care can be reached via pager [phone] and by submitting an online request Visit Assessment:

## 2015-01-19 NOTE — Discharge Instructions (Signed)

## 2015-01-19 NOTE — ED Provider Notes (Signed)
[facility] Emergency Department Provider Note     Time seen: ----------------------------------------- 5:03 PM on 01/19/2015 -----------------------------------------    I have reviewed the triage vital signs and the nursing notes.   HISTORY  Chief Complaint Dizziness and Nausea    HPI Kristin Mora is a 59 y.o. female who presents ER for dizziness. Patient states she was feeling lightheaded at work, it was hot were she was working. She states is her first day back to work after having had a stroke in April. Still having some tingling in her left hand and left side of her face from previous stroke. Otherwise she denies any recent weakness or numbness. Denies headache. Patient has not eaten much today, otherwise has not been ill, has been eating and drinking and acting appropriately.   Past Medical History  Diagnosis Date  . Brain aneurysm 04/2007  . Hypertension     controlled with medication  . Diabetes mellitus without complication     controlled;    Patient Active Problem List   Diagnosis Date Noted  . Chronic airway obstruction 11/04/2014  . Cough 11/04/2014  . Avitaminosis D 11/04/2014  . Adiposity 11/04/2014  . Hypercholesteremia 11/04/2014  . BP (high blood pressure) 11/04/2014  . LBP (low back pain) 11/04/2014  . Gonalgia 11/04/2014  . Heel pain 11/04/2014  . Hemorrhagic cerebrovascular accident (CVA) 11/04/2014  . Diabetes mellitus, type 2 10/01/2014  . Essential (primary) hypertension 10/01/2014  . Thalamic hemorrhage 10/01/2014    Past Surgical History  Procedure Laterality Date  . Tubal ligation    . Tonsillectomy and adenoidectomy    . Craniotomy  04/2007  . Knee surgery      Allergies Lisinopril and Latex  Social History History  Substance Use Topics  . Smoking status: Former Smoker    Quit date: 11/25/2009  . Smokeless tobacco: Not on file  . Alcohol Use: No    Review of Systems Constitutional: Negative  for fever. Eyes: Negative for visual changes. ENT: Negative for sore throat. Cardiovascular: Negative for chest pain. Respiratory: Negative for shortness of breath. Gastrointestinal: Negative for abdominal pain, vomiting and diarrhea. Genitourinary: Negative for dysuria. Musculoskeletal: Negative for back pain. Skin: Negative for rash. Neurological: Positive chronic paresthesias left side of her face and left hand  10-point ROS otherwise negative.  ____________________________________________   PHYSICAL EXAM:  VITAL SIGNS: ED Triage Vitals  Enc Vitals Group     BP 01/19/15 1530 141/55 mmHg     Pulse Rate 01/19/15 1530 68     Resp 01/19/15 1530 18     Temp 01/19/15 1530 97.9 F (36.6 C)     Temp Source 01/19/15 1530 Oral     SpO2 01/19/15 1530 100 %     Weight 01/19/15 1530 288 lb (130.636 kg)     Height 01/19/15 1530 5\' 2"  (1.575 m)     Head Cir --      Peak Flow --      Pain Score --      Pain Loc --      Pain Edu? --      Excl. in [place]? --     Constitutional: Alert and oriented. Well appearing and in no distress. Eyes: Conjunctivae are normal. PERRL. Normal extraocular movements. ENT   Head: Normocephalic and atraumatic.   Nose: No congestion/rhinnorhea.   Mouth/Throat: Mucous membranes are moist.   Neck: No stridor. Hematological/Lymphatic/Immunilogical: No cervical lymphadenopathy. Cardiovascular: Normal rate, regular rhythm. Normal and symmetric distal pulses are present  in all extremities. No murmurs, rubs, or gallops. Respiratory: Normal respiratory effort without tachypnea nor retractions. Breath sounds are clear and equal bilaterally. No wheezes/rales/rhonchi. Gastrointestinal: Soft and nontender. No distention. No abdominal bruits. There is no CVA tenderness. Musculoskeletal: Nontender with normal range of motion in all extremities. No joint effusions.  No lower extremity tenderness nor edema. Neurologic:  Normal speech and language. No gross  focal neurologic deficits are appreciated. Speech is normal. No gait instability. Positive for paresthesias in the left palmar surface of the hand and left side of her face Skin:  Skin is warm, dry and intact. No rash noted. Psychiatric: Mood and affect are normal. Speech and behavior are normal. Patient exhibits appropriate insight and judgment. ____________________________________________  EKG: Interpreted by me. Normal sinus rhythm with a rate of 70, normal axis normal intervals. No evidence of hypertrophy or acute infarction.   ____________________________________________  ED COURSE:  Pertinent labs & imaging results that were available during my care of the patient were reviewed by me and considered in my medical decision making (see chart for details). Patiently basic lab works, reevaluation. Her physical exam is benign at this time. ____________________________________________    LABS (pertinent positives/negatives)  Labs Reviewed  BASIC METABOLIC PANEL - Abnormal; Notable for the following:    Chloride 99 (*)    BUN 28 (*)    Creatinine, Ser 1.17 (*)    GFR calc non Af Amer 49 (*)    GFR calc Af Amer 57 (*)    All other components within normal limits  CBC - Abnormal; Notable for the following:    WBC 13.0 (*)    RDW 16.0 (*)    All other components within normal limits  URINALYSIS COMPLETEWITH MICROSCOPIC ([facility] ONLY) - Abnormal; Notable for the following:    Color, Urine YELLOW (*)    APPearance CLEAR (*)    Squamous Epithelial / LPF 0-5 (*)    All other components within normal limits  GLUCOSE, CAPILLARY  CBG MONITORING, ED    RADIOLOGY CT head IMPRESSION: 1. Postoperative changes. 2. Periventricular white matter changes, stable. 3. No evidence for acute abnormality.  ____________________________________________  FINAL ASSESSMENT AND PLAN  Dizziness  Plan: Patient with labs and imaging as dictated above. Patient looks well, feels better. She is stable  for outpatient follow-up with her doctor   Earleen Newport, MD   Earleen Newport, MD 01/20/15 2302

## 2015-01-21 NOTE — Telephone Encounter (Signed)
PATIENT HAS BEEN ADVISED OF LABS. DUPLICATE MESSAGE

## 2015-02-02 ENCOUNTER — Other Ambulatory Visit: Payer: Self-pay | Admitting: Family Medicine

## 2015-04-13 ENCOUNTER — Ambulatory Visit: Payer: BLUE CROSS/BLUE SHIELD | Admitting: Family Medicine

## 2015-04-17 ENCOUNTER — Ambulatory Visit: Payer: BLUE CROSS/BLUE SHIELD | Admitting: Family Medicine

## 2015-05-25 ENCOUNTER — Emergency Department: Payer: Self-pay

## 2015-05-25 ENCOUNTER — Encounter: Payer: Self-pay | Admitting: Emergency Medicine

## 2015-05-25 ENCOUNTER — Inpatient Hospital Stay
Admission: EM | Admit: 2015-05-25 | Discharge: 2015-05-27 | DRG: 101 | Disposition: A | Discharge status: Home / Self Care | Payer: Self-pay | Attending: Internal Medicine | Admitting: Internal Medicine

## 2015-05-25 DIAGNOSIS — Z833 Family history of diabetes mellitus: Secondary | ICD-10-CM

## 2015-05-25 DIAGNOSIS — Z8673 Personal history of transient ischemic attack (TIA), and cerebral infarction without residual deficits: Secondary | ICD-10-CM

## 2015-05-25 DIAGNOSIS — Z8249 Family history of ischemic heart disease and other diseases of the circulatory system: Secondary | ICD-10-CM

## 2015-05-25 DIAGNOSIS — G4089 Other seizures: Principal | ICD-10-CM | POA: Diagnosis present

## 2015-05-25 DIAGNOSIS — Z808 Family history of malignant neoplasm of other organs or systems: Secondary | ICD-10-CM

## 2015-05-25 DIAGNOSIS — Z823 Family history of stroke: Secondary | ICD-10-CM

## 2015-05-25 DIAGNOSIS — Z9889 Other specified postprocedural states: Secondary | ICD-10-CM

## 2015-05-25 DIAGNOSIS — Z87891 Personal history of nicotine dependence: Secondary | ICD-10-CM

## 2015-05-25 DIAGNOSIS — Z801 Family history of malignant neoplasm of trachea, bronchus and lung: Secondary | ICD-10-CM

## 2015-05-25 DIAGNOSIS — Z91040 Latex allergy status: Secondary | ICD-10-CM

## 2015-05-25 DIAGNOSIS — Z888 Allergy status to other drugs, medicaments and biological substances status: Secondary | ICD-10-CM

## 2015-05-25 DIAGNOSIS — I152 Hypertension secondary to endocrine disorders: Secondary | ICD-10-CM | POA: Diagnosis present

## 2015-05-25 DIAGNOSIS — E1169 Type 2 diabetes mellitus with other specified complication: Secondary | ICD-10-CM | POA: Diagnosis present

## 2015-05-25 DIAGNOSIS — E119 Type 2 diabetes mellitus without complications: Secondary | ICD-10-CM

## 2015-05-25 DIAGNOSIS — E7800 Pure hypercholesterolemia, unspecified: Secondary | ICD-10-CM | POA: Diagnosis present

## 2015-05-25 DIAGNOSIS — E1159 Type 2 diabetes mellitus with other circulatory complications: Secondary | ICD-10-CM | POA: Diagnosis present

## 2015-05-25 DIAGNOSIS — Z8049 Family history of malignant neoplasm of other genital organs: Secondary | ICD-10-CM

## 2015-05-25 DIAGNOSIS — E785 Hyperlipidemia, unspecified: Secondary | ICD-10-CM | POA: Diagnosis present

## 2015-05-25 DIAGNOSIS — Z79899 Other long term (current) drug therapy: Secondary | ICD-10-CM

## 2015-05-25 DIAGNOSIS — I10 Essential (primary) hypertension: Secondary | ICD-10-CM | POA: Diagnosis present

## 2015-05-25 DIAGNOSIS — J45909 Unspecified asthma, uncomplicated: Secondary | ICD-10-CM | POA: Diagnosis present

## 2015-05-25 DIAGNOSIS — I639 Cerebral infarction, unspecified: Secondary | ICD-10-CM | POA: Diagnosis present

## 2015-05-25 DIAGNOSIS — Z9851 Tubal ligation status: Secondary | ICD-10-CM

## 2015-05-25 HISTORY — DX: Unspecified asthma, uncomplicated: J45.909

## 2015-05-25 HISTORY — DX: Hyperlipidemia, unspecified: E78.5

## 2015-05-25 HISTORY — DX: Cerebral infarction, unspecified: I63.9

## 2015-05-25 LAB — COMPREHENSIVE METABOLIC PANEL
ALBUMIN: 3.6 g/dL (ref 3.5–5.0)
ALT: 28 U/L (ref 14–54)
ANION GAP: 8 (ref 5–15)
AST: 27 U/L (ref 15–41)
Alkaline Phosphatase: 63 U/L (ref 38–126)
BILIRUBIN TOTAL: 0.7 mg/dL (ref 0.3–1.2)
BUN: 19 mg/dL (ref 6–20)
CHLORIDE: 102 mmol/L (ref 101–111)
CO2: 30 mmol/L (ref 22–32)
Calcium: 9.3 mg/dL (ref 8.9–10.3)
Creatinine, Ser: 0.66 mg/dL (ref 0.44–1.00)
GFR calc Af Amer: >60 mL/min (ref >60)
GLUCOSE: 114 mg/dL — AB (ref 65–99)
POTASSIUM: 3.1 mmol/L — AB (ref 3.5–5.1)
Sodium: 140 mmol/L (ref 135–145)
TOTAL PROTEIN: 8 g/dL (ref 6.5–8.1)

## 2015-05-25 LAB — CBC WITH DIFFERENTIAL/PLATELET
BASOS ABS: 0 10*3/uL (ref 0–0.1)
BASOS PCT: 0 %
Eosinophils Absolute: 0.3 10*3/uL (ref 0–0.7)
Eosinophils Relative: 3 %
HEMATOCRIT: 46.2 % (ref 35.0–47.0)
HEMOGLOBIN: 14.6 g/dL (ref 12.0–16.0)
LYMPHS PCT: 29 %
Lymphs Abs: 3.4 10*3/uL (ref 1.0–3.6)
MCH: 27.9 pg (ref 26.0–34.0)
MCHC: 31.6 g/dL — ABNORMAL LOW (ref 32.0–36.0)
MCV: 88.2 fL (ref 80.0–100.0)
Monocytes Absolute: 0.9 10*3/uL (ref 0.2–0.9)
Monocytes Relative: 7 %
NEUTROS PCT: 61 %
Neutro Abs: 7.3 10*3/uL — ABNORMAL HIGH (ref 1.4–6.5)
Platelets: 254 10*3/uL (ref 150–440)
RBC: 5.24 MIL/uL — ABNORMAL HIGH (ref 3.80–5.20)
RDW: 15 % — AB (ref 11.5–14.5)
WBC: 11.9 10*3/uL — ABNORMAL HIGH (ref 3.6–11.0)

## 2015-05-25 LAB — URINALYSIS COMPLETE WITH MICROSCOPIC (ARMC ONLY)
BILIRUBIN URINE: NEGATIVE
Bacteria, UA: NONE SEEN
Glucose, UA: NEGATIVE mg/dL
Hgb urine dipstick: NEGATIVE
KETONES UR: NEGATIVE mg/dL
LEUKOCYTES UA: NEGATIVE
NITRITE: NEGATIVE
Protein, ur: NEGATIVE mg/dL
SPECIFIC GRAVITY, URINE: 1.024 (ref 1.005–1.030)
pH: 5 (ref 5.0–8.0)

## 2015-05-25 LAB — TROPONIN I

## 2015-05-25 MED ORDER — STROKE: EARLY STAGES OF RECOVERY BOOK
Freq: Once | Status: AC
  Administered 2015-05-25

## 2015-05-25 MED ORDER — HEPARIN SODIUM (PORCINE) 5000 UNIT/ML IJ SOLN
5000.0000 [IU] | Freq: Three times a day (TID) | INTRAMUSCULAR | Status: DC
  Administered 2015-05-26 – 2015-05-27 (×5): 5000 [IU] via SUBCUTANEOUS
  Filled 2015-05-25 (×5): qty 1

## 2015-05-25 MED ORDER — ATORVASTATIN CALCIUM 20 MG PO TABS
40.0000 mg | ORAL_TABLET | Freq: Every evening | ORAL | Status: DC
  Administered 2015-05-26 – 2015-05-27 (×2): 40 mg via ORAL
  Filled 2015-05-25 (×2): qty 2

## 2015-05-25 MED ORDER — INFLUENZA VAC SPLIT QUAD 0.5 ML IM SUSY
0.5000 mL | PREFILLED_SYRINGE | INTRAMUSCULAR | Status: AC
  Administered 2015-05-26: 0.5 mL via INTRAMUSCULAR
  Filled 2015-05-25: qty 0.5

## 2015-05-25 MED ORDER — METFORMIN HCL 500 MG PO TABS
500.0000 mg | ORAL_TABLET | Freq: Two times a day (BID) | ORAL | Status: DC
  Administered 2015-05-26 – 2015-05-27 (×3): 500 mg via ORAL
  Filled 2015-05-25 (×3): qty 1

## 2015-05-25 NOTE — ED Provider Notes (Signed)
[facility] Emergency Department Provider Note  ____________________________________________  Time seen: Approximately 6:27 PM  I have reviewed the triage vital signs and the nursing notes.   HISTORY  Chief Complaint Numbness    HPI Kristin Mora is a 59 y.o. female patient reports she had a stroke with residual left-sided numbness. On Thursday she felt like she was getting hot for no apparent reason and she had burning in the left face and left arm in the area where she has the numbness from the stroke. Happened almost on a daily basis everyday since then whenever she "gets hot" patient has no weakness or change in the numbness just gets this burning sensation again when she gets hot.   Past Medical History  Diagnosis Date  . Brain aneurysm 04/2007  . Hypertension     controlled with medication  . Diabetes mellitus without complication ([place])     controlled;  . Stroke ([place])   . Asthma   . HLD (hyperlipidemia)     Patient Active Problem List   Diagnosis Date Noted  . Stroke ([place]) 05/25/2015  . Chronic airway obstruction ([place]) 11/04/2014  . Cough 11/04/2014  . Avitaminosis D 11/04/2014  . Adiposity 11/04/2014  . Hypercholesteremia 11/04/2014  . LBP (low back pain) 11/04/2014  . Gonalgia 11/04/2014  . Heel pain 11/04/2014  . Hemorrhagic cerebrovascular accident (CVA) ([place]) 11/04/2014  . Diabetes mellitus, type 2 ([place]) 10/01/2014  . Essential (primary) hypertension 10/01/2014  . Thalamic hemorrhage ([place]) 10/01/2014    Past Surgical History  Procedure Laterality Date  . Tubal ligation    . Tonsillectomy and adenoidectomy    . Craniotomy  04/2007  . Knee surgery      Current Outpatient Rx  Name  Route  Sig  Dispense  Refill  . amLODipine (NORVASC) 5 MG tablet   Oral   Take 5 mg by mouth daily.         Marland Kitchen atorvastatin (LIPITOR) 40 MG tablet   Oral   Take 40 mg by mouth every evening.         Marland Kitchen losartan (COZAAR) 50 MG tablet  Oral   Take 50 mg by mouth daily.         . metFORMIN (GLUCOPHAGE) 500 MG tablet   Oral   Take 500 mg by mouth 2 (two) times daily.           Allergies Lisinopril and Latex  Family History  Problem Relation Age of Onset  . Heart attack Mother   . Coronary artery disease Mother   . Lung cancer Father   . Diabetes Sister   . Stroke Brother   . Uterine cancer Sister   . Lung cancer Sister   . Bone cancer Brother     Social History Social History  Substance Use Topics  . Smoking status: Former Smoker    Quit date: 11/25/2009  . Smokeless tobacco: None  . Alcohol Use: No    Review of Systems Constitutional: No fever/chills Eyes: No visual changes. ENT: No sore throat. Cardiovascular: Denies chest pain. Respiratory: Denies shortness of breath. Gastrointestinal: No abdominal pain.  No nausea, no vomiting.  No diarrhea.  No constipation. Genitourinary: Negative for dysuria. Musculoskeletal: Negative for back pain. Skin: Negative for rash. Neurological: Negative for headaches, focal weakness   10-point ROS otherwise negative.  ____________________________________________   PHYSICAL EXAM:  VITAL SIGNS: ED Triage Vitals  Enc Vitals Group     BP 05/25/15 1536 154/109 mmHg  Pulse Rate 05/25/15 1536 82     Resp 05/25/15 1536 20     Temp 05/25/15 1536 98.3 F (36.8 C)     Temp src --      SpO2 05/25/15 1536 96 %     Weight 05/25/15 1536 288 lb (130.636 kg)     Height 05/25/15 1536 5\' 2"  (1.575 m)     Head Cir --      Peak Flow --      Pain Score --      Pain Loc --      Pain Edu? --      Excl. in [place]? --     Constitutional: Alert and oriented. Well appearing and in no acute distress. Eyes: Conjunctivae are normal. PERRL. EOMI. Head: Atraumatic. Nose: No congestion/rhinnorhea. Mouth/Throat: Mucous membranes are moist.  Oropharynx non-erythematous. Neck: No stridor. Cardiovascular: Normal rate, regular rhythm. Grossly normal heart sounds.  Good  peripheral circulation. Respiratory: Normal respiratory effort.  No retractions. Lungs CTAB. Gastrointestinal: Soft and nontender. No distention. No abdominal bruits. No CVA tenderness. Musculoskeletal: No lower extremity tenderness nor edema.  No joint effusions. Neurologic:  Normal speech and language. No new gross focal neurologic deficits are appreciated. No gait instability. Skin:  Skin is warm, dry and intact. No rash noted.   ____________________________________________   LABS (all labs ordered are listed, but only abnormal results are displayed)  Labs Reviewed  COMPREHENSIVE METABOLIC PANEL - Abnormal; Notable for the following:    Potassium 3.1 (*)    Glucose, Bld 114 (*)    All other components within normal limits  CBC WITH DIFFERENTIAL/PLATELET - Abnormal; Notable for the following:    WBC 11.9 (*)    RBC 5.24 (*)    MCHC 31.6 (*)    RDW 15.0 (*)    Neutro Abs 7.3 (*)    All other components within normal limits  URINALYSIS COMPLETEWITH MICROSCOPIC ([facility] ONLY) - Abnormal; Notable for the following:    Color, Urine YELLOW (*)    APPearance HAZY (*)    Squamous Epithelial / LPF 0-5 (*)    All other components within normal limits  TROPONIN I  CBG MONITORING, ED   ____________________________________________  EKG EKG read and interpreted by me shows normal sinus rhythm rate of 78 left axis diffuse ST-T wave flattening computer is reading an unusual P axis ____________________________________________  RADIOLOGY  Radiology repeat CT as no acute changes Radiology reads chest x-ray as no acute changes mild COPD chronic bronchitis ____________________________________________   PROCEDURES  SO see neurology specialist on-call calls and says he thinks she's had a thalamic lacunar stroke. We will plan to admit the patient  ____________________________________________   INITIAL IMPRESSION / ASSESSMENT AND PLAN / ED COURSE  Pertinent labs & imaging results that  were available during my care of the patient were reviewed by me and considered in my medical decision making (see chart for details).   ____________________________________________   FINAL CLINICAL IMPRESSION(S) / ED DIAGNOSES  Final diagnoses:  Cerebrovascular accident (CVA), unspecified mechanism ([place])      Nena Polio, MD 05/25/15 2236

## 2015-05-25 NOTE — ED Notes (Signed)
TeleNeurology consulted ended.

## 2015-05-25 NOTE — ED Notes (Signed)
Mickel Baas from [facility] called to get report.

## 2015-05-25 NOTE — ED Notes (Signed)
Patient speaking with TeleNeurology.

## 2015-05-25 NOTE — ED Notes (Addendum)
States she felt some numbness and some facial swelling on Thursday  States it feels hot also having some tingling to left hand

## 2015-05-25 NOTE — H&P (Signed)
[facility] [place] NAME: Kristin Mora    MR#:  [id number]  DATE OF BIRTH:  1955/11/30  DATE OF ADMISSION:  05/25/2015  PRIMARY CARE PHYSICIAN: Carmon Ginsberg, PA   REQUESTING/REFERRING PHYSICIAN: Cinda Quest, MD  CHIEF COMPLAINT:   Chief Complaint  Patient presents with  . Numbness    HISTORY OF PRESENT ILLNESS:  Kristin Mora  is a 59 y.o. female who presents with worsening left facial numbness. She has a prior history of strokes, including aneurysm rupture with hemorrhage near her thalamus status post surgical repair. She also has a history of left-sided ischemic stroke 8 months ago. She states that 4 days ago she had the same feeling she had when she had her stroke earlier this year, she was diaphoretic and very flushed. She noticed at that time that the numbness to the left side of her face, which never completely resolved after her last stroke, but significantly worse. She also noticed some tingling in her left hand. She did not come to the ED right away, but when the numbness didn't get any better over the next couple of days she decided to come in to be evaluated. In the ED she had a CT scan which showed numerous old small infarcts as well as her postsurgical changes. Telemetry neurology was consult to and felt that she might have Cheiro-oral syndrome and recommended admission and workup and treatment for stroke.  PAST MEDICAL HISTORY:   Past Medical History  Diagnosis Date  . Brain aneurysm 04/2007  . Hypertension     controlled with medication  . Diabetes mellitus without complication ([place])     controlled;  . Stroke ([place])   . Asthma   . HLD (hyperlipidemia)     PAST SURGICAL HISTORY:   Past Surgical History  Procedure Laterality Date  . Tubal ligation    . Tonsillectomy and adenoidectomy    . Craniotomy  04/2007  . Knee surgery      SOCIAL HISTORY:   Social History  Substance Use Topics  . Smoking status:  Former Smoker    Quit date: 11/25/2009  . Smokeless tobacco: Not on file  . Alcohol Use: No    FAMILY HISTORY:   Family History  Problem Relation Age of Onset  . Heart attack Mother   . Coronary artery disease Mother   . Lung cancer Father   . Diabetes Sister   . Stroke Brother   . Uterine cancer Sister   . Lung cancer Sister   . Bone cancer Brother     DRUG ALLERGIES:   Allergies  Allergen Reactions  . Lisinopril Swelling  . Latex Rash    MEDICATIONS AT HOME:   Prior to Admission medications   Medication Sig Start Date End Date Taking? Authorizing Provider  amLODipine (NORVASC) 5 MG tablet Take 5 mg by mouth daily.   Yes Historical Provider, MD  atorvastatin (LIPITOR) 40 MG tablet Take 40 mg by mouth every evening.   Yes Historical Provider, MD  losartan (COZAAR) 50 MG tablet Take 50 mg by mouth daily.   Yes Historical Provider, MD  metFORMIN (GLUCOPHAGE) 500 MG tablet Take 500 mg by mouth 2 (two) times daily.   Yes Historical Provider, MD    REVIEW OF SYSTEMS:  Review of Systems  Constitutional: Negative for fever, chills, weight loss and malaise/fatigue.  HENT: Negative for ear pain, hearing loss and tinnitus.   Eyes: Negative for blurred vision, double vision, pain and  redness.  Respiratory: Negative for cough, hemoptysis and shortness of breath.   Cardiovascular: Negative for chest pain, palpitations, orthopnea and leg swelling.  Gastrointestinal: Negative for nausea, vomiting, abdominal pain, diarrhea and constipation.  Genitourinary: Negative for dysuria, frequency and hematuria.  Musculoskeletal: Negative for back pain, joint pain and neck pain.  Skin:       No acne, rash, or lesions  Neurological: Positive for sensory change. Negative for dizziness, tremors, focal weakness and weakness.  Endo/Heme/Allergies: Negative for polydipsia. Does not bruise/bleed easily.  Psychiatric/Behavioral: Negative for depression. The patient is not nervous/anxious and does  not have insomnia.      VITAL SIGNS:   Filed Vitals:   05/25/15 1730 05/25/15 1932 05/25/15 2000 05/25/15 2030  BP: 180/88 173/66 168/74 200/80  Pulse: 69 68 76 72  Temp:      Resp: 18 18 20 20   Height:      Weight:      SpO2: 97% 96% 96% 97%   Wt Readings from Last 3 Encounters:  05/25/15 130.636 kg (288 lb)  01/19/15 130.636 kg (288 lb)  01/09/15 131.725 kg (290 lb 6.4 oz)    PHYSICAL EXAMINATION:  Physical Exam  Vitals reviewed. Constitutional: She is oriented to person, place, and time. She appears well-developed and well-nourished. No distress.  HENT:  Head: Normocephalic and atraumatic.  Mouth/Throat: Oropharynx is clear and moist.  Eyes: Conjunctivae and EOM are normal. Pupils are equal, round, and reactive to light. No scleral icterus.  Neck: Normal range of motion. Neck supple. No JVD present. No thyromegaly present.  Cardiovascular: Normal rate, regular rhythm and intact distal pulses.  Exam reveals no gallop and no friction rub.   No murmur heard. Respiratory: Effort normal and breath sounds normal. No respiratory distress. She has no wheezes. She has no rales.  GI: Soft. Bowel sounds are normal. She exhibits no distension. There is no tenderness.  Musculoskeletal: Normal range of motion. She exhibits no edema.  No arthritis, no gout  Lymphadenopathy:    She has no cervical adenopathy.  Neurological: She is alert and oriented to person, place, and time. No cranial nerve deficit.  Neurologic: Cranial nerves II-XII intact, Sensation intact to light touch/pinprick except in the region of her left face from her perioral area up to her left orbital region, 5/5 strength in all extremities, no dysarthria, no aphasia, no dysphagia, memory intact, finger to nose testing showed no abnormality, no pronator drift, DTR intact, Babinski sign not present.   Skin: Skin is warm and dry. No rash noted. No erythema.  Psychiatric: She has a normal mood and affect. Her behavior is  normal. Judgment and thought content normal.    LABORATORY PANEL:   CBC  Recent Labs Lab 05/25/15 1552  WBC 11.9*  HGB 14.6  HCT 46.2  PLT 254   ------------------------------------------------------------------------------------------------------------------  Chemistries   Recent Labs Lab 05/25/15 1552  NA 140  K 3.1*  CL 102  CO2 30  GLUCOSE 114*  BUN 19  CREATININE 0.66  CALCIUM 9.3  AST 27  ALT 28  ALKPHOS 63  BILITOT 0.7   ------------------------------------------------------------------------------------------------------------------  Cardiac Enzymes  Recent Labs Lab 05/25/15 1552  TROPONINI <0.03   ------------------------------------------------------------------------------------------------------------------  RADIOLOGY:  Dg Chest 2 View  05/25/2015  CLINICAL DATA:  Chest pain and left arm tingling and numbness with episodes of feeling hot for the past 4 days. Ex-smoker. EXAM: CHEST  2 VIEW COMPARISON:  10/01/2014. FINDINGS: Borderline enlarged cardiac silhouette. Clear lungs. If the lungs are  mildly hyperexpanded with diffuse peribronchial thickening and accentuation of the interstitial markings. Thoracic spine degenerative changes. IMPRESSION: No acute abnormality.  Mild changes of COPD and chronic bronchitis. Electronically Signed   By: Claudie Revering M.D.   On: 05/25/2015 18:03   Ct Head Wo Contrast  05/25/2015  CLINICAL DATA:  Left-sided numbness and nausea for 5 days EXAM: CT HEAD WITHOUT CONTRAST TECHNIQUE: Contiguous axial images were obtained from the base of the skull through the vertex without intravenous contrast. COMPARISON:  January 19, 2015 FINDINGS: The ventricles are normal in size and configuration. There are surgical clips in the anterior circle of Irais Mottram region slightly to the right of midline with evidence of previous right frontal and temporal craniotomy. There is no evidence of mass, hemorrhage, extra-axial fluid collection, or midline  shift. There is evidence of a prior infarct in the posterior inferior head of the caudate nucleus on the left. There is patchy small vessel disease in the centra semiovale bilaterally. There is probable encephalomalacia in the posterior right frontal lobe, stable. No new gray-white compartment lesions are identified. No acute infarct evident. The bony calvarium appears intact except for the postoperative change on the right. There is evidence of old left-sided nasal bone fracture. Mastoid air cells clear. IMPRESSION: Stable postoperative change on the right. Prior small infarcts and small vessel disease, stable. No acute infarct evident. No hemorrhage or mass effect. Old fracture left nasal bone. Electronically Signed   By: Lowella Grip III M.D.   On: 05/25/2015 16:27    EKG:   Orders placed or performed during the hospital encounter of 05/25/15  . ED EKG  . ED EKG  . EKG 12-Lead  . EKG 12-Lead  . EKG 12-Lead  . EKG 12-Lead    IMPRESSION AND PLAN:  Principal Problem:   Stroke [facility]) - possible cheiro-oral syndrome per telemetry neurologist. Admission with MRI/MRA, lipids, hemoglobin A1c, echocardiogram, permissive hypertension for the first 24 hours, neurology consult. Patient not on any antiplatelet medication after her last stroke, unclear why. Possibly for her prior history of hemorrhagic stroke, though this was due to a ruptured aneurysm which was surgically repaired. Unable to elucidate further history from patient or family, or chart review. Active Problems:   Diabetes mellitus, type 2 ([place]) - continue home dose metformin once patient is eating after her swallow eval. Before meals at bedtime fingerstick checks. Hemoglobin A1c has above. Can add sliding scale insulin if necessary.   Essential (primary) hypertension - permissive hypertension for the first 24 hours with BP goal less than 220/110. Goal less than 160/100 and subsequently and will need home antihypertensives restarted at that  time.   Hypercholesteremia - continue home statin  All the records are reviewed and case discussed with ED provider. Management plans discussed with the patient and/or family.  DVT PROPHYLAXIS: Subcutaneous heparin  ADMISSION STATUS: Inpatient  CODE STATUS: Full  TOTAL TIME TAKING CARE OF THIS PATIENT: 45 minutes.    Coltrane Tugwell FIELDING 05/25/2015, 10:11 PM  [facility]  Office  [phone]  CC: Primary care physician; Carmon Ginsberg, PA

## 2015-05-26 ENCOUNTER — Inpatient Hospital Stay: Payer: Self-pay

## 2015-05-26 ENCOUNTER — Inpatient Hospital Stay: Payer: BLUE CROSS/BLUE SHIELD

## 2015-05-26 ENCOUNTER — Inpatient Hospital Stay
Admit: 2015-05-26 | Discharge: 2015-05-26 | Disposition: A | Discharge status: Home / Self Care | Payer: Self-pay | Attending: Internal Medicine | Admitting: Internal Medicine

## 2015-05-26 LAB — CBC
HEMATOCRIT: 42.6 % (ref 35.0–47.0)
HEMOGLOBIN: 13.9 g/dL (ref 12.0–16.0)
MCH: 28.6 pg (ref 26.0–34.0)
MCHC: 32.7 g/dL (ref 32.0–36.0)
MCV: 87.5 fL (ref 80.0–100.0)
Platelets: 230 10*3/uL (ref 150–440)
RBC: 4.87 MIL/uL (ref 3.80–5.20)
RDW: 15.2 % — AB (ref 11.5–14.5)
WBC: 10.4 10*3/uL (ref 3.6–11.0)

## 2015-05-26 LAB — COMPREHENSIVE METABOLIC PANEL
ALK PHOS: 58 U/L (ref 38–126)
ALT: 28 U/L (ref 14–54)
ANION GAP: 7 (ref 5–15)
AST: 26 U/L (ref 15–41)
Albumin: 3.3 g/dL — ABNORMAL LOW (ref 3.5–5.0)
BILIRUBIN TOTAL: 0.9 mg/dL (ref 0.3–1.2)
BUN: 22 mg/dL — AB (ref 6–20)
CALCIUM: 9 mg/dL (ref 8.9–10.3)
CO2: 30 mmol/L (ref 22–32)
Chloride: 105 mmol/L (ref 101–111)
Creatinine, Ser: 0.85 mg/dL (ref 0.44–1.00)
GFR calc Af Amer: >60 mL/min (ref >60)
Glucose, Bld: 145 mg/dL — ABNORMAL HIGH (ref 65–99)
POTASSIUM: 3.4 mmol/L — AB (ref 3.5–5.1)
Sodium: 142 mmol/L (ref 135–145)
TOTAL PROTEIN: 7.1 g/dL (ref 6.5–8.1)

## 2015-05-26 LAB — LIPID PANEL
CHOLESTEROL: 139 mg/dL (ref 0–200)
HDL: 29 mg/dL — AB (ref >40)
LDL Cholesterol: 73 mg/dL (ref 0–99)
TRIGLYCERIDES: 187 mg/dL — AB (ref <150)
Total CHOL/HDL Ratio: 4.8 RATIO
VLDL: 37 mg/dL (ref 0–40)

## 2015-05-26 LAB — GLUCOSE, CAPILLARY
GLUCOSE-CAPILLARY: 103 mg/dL — AB (ref 65–99)
Glucose-Capillary: 130 mg/dL — ABNORMAL HIGH (ref 65–99)
Glucose-Capillary: 112 mg/dL — ABNORMAL HIGH (ref 65–99)
Glucose-Capillary: 95 mg/dL (ref 65–99)

## 2015-05-26 LAB — HEMOGLOBIN A1C: HEMOGLOBIN A1C: 6.3 % — AB (ref 4.0–6.0)

## 2015-05-26 MED ORDER — AMLODIPINE BESYLATE 5 MG PO TABS
5.0000 mg | ORAL_TABLET | Freq: Every day | ORAL | Status: DC
  Administered 2015-05-26 – 2015-05-27 (×2): 5 mg via ORAL
  Filled 2015-05-26 (×2): qty 1

## 2015-05-26 MED ORDER — GABAPENTIN 600 MG PO TABS
300.0000 mg | ORAL_TABLET | Freq: Two times a day (BID) | ORAL | Status: DC
  Administered 2015-05-26 – 2015-05-27 (×3): 300 mg via ORAL
  Filled 2015-05-26: qty 1
  Filled 2015-05-26: qty 2
  Filled 2015-05-26: qty 1

## 2015-05-26 NOTE — Plan of Care (Addendum)
No c/o pain. VSS, afebrile. NIH 1 (left sided numbness/tingling unchanged). MRI, Echo, Carotid US scheduled for today. Neuro consulted.   Problem: Education: Goal: Knowledge of [facility] Education information/materials will improve Outcome: Progressing Stroke education book provided and reviewed. Pt verbalizes understanding.   Problem: Safety: Goal: Ability to remain free from injury will improve Outcome: Progressing Low fall risk. Safely ambulates independently w/ steady gait. Safe environment provided & explained to call for assistance if needed.  Problem: Skin Integrity: Goal: Risk for impaired skin integrity will decrease Outcome: Progressing Skin dry & intact. Skin care independently completed.  Problem: Self-Care: Goal: Ability to participate in self-care as condition permits will improve Outcome: Progressing Pt expressed worry about "obtaining medications due to expenses r/t being unable to work & being denied disability"

## 2015-05-26 NOTE — Evaluation (Signed)
Occupational Therapy Evaluation Patient Details Name: Kristin Mora MRN: [id number] DOB: 1955/08/25 Today's Date: 05/26/2015    History of Present Illness This patient is a 59 year old female who came to [facility] after feeling left sided "tingling"    Clinical Impression   She lives with her daughter and grandchildren in a second floor apartment. She had been independent with BADL. Today she is having some trouble with BADL and would benefit from Occupational Therapy for activities of daily living training.    Follow Up Recommendations       Equipment Recommendations       Recommendations for Other Services       Precautions / Restrictions        Mobility Bed Mobility               General bed mobility comments: moderate assist supine to sit  Transfers                 General transfer comment: contact guard sit to stand and stand to sit.    Balance                                            ADL                                         General ADL Comments: Patient had been independent with ADL. Patient now requiring assist for lower body dressing.     Vision     Perception     Praxis      Pertinent Vitals/Pain       Hand Dominance Left   Extremity/Trunk Assessment Upper Extremity Assessment Upper Extremity Assessment:  (B shoulders 4+/5, distal 5/5 light touch, stereognosis and temp intact, sharp mildly diminished in left hand.)           Communication     Cognition Arousal/Alertness: Awake/alert Behavior During Therapy: WFL for tasks assessed/performed Overall Cognitive Status: Within Functional Limits for tasks assessed                     General Comments       Exercises       Shoulder Instructions      Home Living Family/patient expects to be discharged to:: Private residence Living Arrangements: Children Available Help at Discharge: Family Type of Home: Apartment Home  Access: Stairs to enter [employer] of Steps: 12 to get in Entrance Stairs-Rails: Right Home Layout: One level                          Prior Functioning/Environment               OT Diagnosis: Paresis   OT Problem List: Decreased activity tolerance;Impaired sensation   OT Treatment/Interventions: Self-care/ADL training    OT Goals(Current goals can be found in the care plan section) Acute Rehab OT Goals Patient Stated Goal: to get better OT Goal Formulation: With patient Time For Goal Achievement: 06/09/15 Potential to Achieve Goals: Good  OT Frequency: Min 1X/week   Barriers to D/C:            Co-evaluation              End of Session  Equipment Utilized During Treatment: Gait belt  Activity Tolerance:   Patient left: in bed;with call bell/phone within reach;with bed alarm set;with nursing/sitter in room   Time: 1055-1115 OT Time Calculation (min): 20 min Charges:  OT General Charges $OT Visit: 1 Procedure OT Evaluation $Initial OT Evaluation Tier I: 1 Procedure G-Codes:    Myrene Galas, MS/OTR/L  05/26/2015, 11:24 AM

## 2015-05-26 NOTE — Consult Note (Signed)
Reason for Consult: stroke Referring Physician: Dr. [name] is an 59 y.o. female.  HPI:  59 yo RHD F presents with L facial numbness.  Pt states that it started in her face and spread to her L arm over one minute.  This has happened once before and she was told that it was stress related.  She denies headache.  She denies weakness, speech changes or vision problems.  She has no pain in her face.  Past Medical History  Diagnosis Date  . Brain aneurysm 04/2007  . Hypertension     controlled with medication  . Diabetes mellitus without complication ([place])     controlled;  . Stroke ([place])   . Asthma   . HLD (hyperlipidemia)     Past Surgical History  Procedure Laterality Date  . Tubal ligation    . Tonsillectomy and adenoidectomy    . Craniotomy  04/2007  . Knee surgery      Family History  Problem Relation Age of Onset  . Heart attack Mother   . Coronary artery disease Mother   . Lung cancer Father   . Diabetes Sister   . Stroke Brother   . Uterine cancer Sister   . Lung cancer Sister   . Bone cancer Brother     Social History:  reports that she quit smoking about 5 years ago. She does not have any smokeless tobacco history on file. She reports that she does not drink alcohol or use illicit drugs.  Allergies:  Allergies  Allergen Reactions  . Lisinopril Swelling  . Latex Rash    Medications: personally reviewed by me  Results for orders placed or performed during the hospital encounter of 05/25/15 (from the past 48 hour(s))  Comprehensive metabolic panel     Status: Abnormal   Collection Time: 05/25/15  3:52 PM  Result Value Ref Range   Sodium 140 135 - 145 mmol/L   Potassium 3.1 (L) 3.5 - 5.1 mmol/L   Chloride 102 101 - 111 mmol/L   CO2 30 22 - 32 mmol/L   Glucose, Bld 114 (H) 65 - 99 mg/dL   BUN 19 6 - 20 mg/dL   Creatinine, Ser 0.66 0.44 - 1.00 mg/dL   Calcium 9.3 8.9 - 10.3 mg/dL   Total Protein 8.0 6.5 - 8.1 g/dL   Albumin 3.6 3.5 - 5.0  g/dL   AST 27 15 - 41 U/L   ALT 28 14 - 54 U/L   Alkaline Phosphatase 63 38 - 126 U/L   Total Bilirubin 0.7 0.3 - 1.2 mg/dL   GFR calc non Af Amer >60 >60 mL/min   GFR calc Af Amer >60 >60 mL/min    Comment: (NOTE) The eGFR has been calculated using the CKD EPI equation. This calculation has not been validated in all clinical situations. eGFR's persistently <60 mL/min signify possible Chronic Kidney Disease.    Anion gap 8 5 - 15  Troponin I     Status: None   Collection Time: 05/25/15  3:52 PM  Result Value Ref Range   Troponin I <0.03 <0.031 ng/mL    Comment:        NO INDICATION OF MYOCARDIAL INJURY.   CBC with Differential     Status: Abnormal   Collection Time: 05/25/15  3:52 PM  Result Value Ref Range   WBC 11.9 (H) 3.6 - 11.0 K/uL   RBC 5.24 (H) 3.80 - 5.20 MIL/uL   Hemoglobin 14.6 12.0 - 16.0  g/dL   HCT 46.2 35.0 - 47.0 %   MCV 88.2 80.0 - 100.0 fL   MCH 27.9 26.0 - 34.0 pg   MCHC 31.6 (L) 32.0 - 36.0 g/dL   RDW 15.0 (H) 11.5 - 14.5 %   Platelets 254 150 - 440 K/uL   Neutrophils Relative % 61 %   Neutro Abs 7.3 (H) 1.4 - 6.5 K/uL   Lymphocytes Relative 29 %   Lymphs Abs 3.4 1.0 - 3.6 K/uL   Monocytes Relative 7 %   Monocytes Absolute 0.9 0.2 - 0.9 K/uL   Eosinophils Relative 3 %   Eosinophils Absolute 0.3 0 - 0.7 K/uL   Basophils Relative 0 %   Basophils Absolute 0.0 0 - 0.1 K/uL  Urinalysis complete, with microscopic     Status: Abnormal   Collection Time: 05/25/15  5:46 PM  Result Value Ref Range   Color, Urine YELLOW (A) YELLOW   APPearance HAZY (A) CLEAR   Glucose, UA NEGATIVE NEGATIVE mg/dL   Bilirubin Urine NEGATIVE NEGATIVE   Ketones, ur NEGATIVE NEGATIVE mg/dL   Specific Gravity, Urine 1.024 1.005 - 1.030   Hgb urine dipstick NEGATIVE NEGATIVE   pH 5.0 5.0 - 8.0   Protein, ur NEGATIVE NEGATIVE mg/dL   Nitrite NEGATIVE NEGATIVE   Leukocytes, UA NEGATIVE NEGATIVE   RBC / HPF 0-5 0 - 5 RBC/hpf   WBC, UA 0-5 0 - 5 WBC/hpf   Bacteria, UA NONE  SEEN NONE SEEN   Squamous Epithelial / LPF 0-5 (A) NONE SEEN   Mucous PRESENT    Hyaline Casts, UA PRESENT   Lipid panel     Status: Abnormal   Collection Time: 05/26/15  5:02 AM  Result Value Ref Range   Cholesterol 139 0 - 200 mg/dL   Triglycerides 187 (H) <150 mg/dL   HDL 29 (L) >40 mg/dL   Total CHOL/HDL Ratio 4.8 RATIO   VLDL 37 0 - 40 mg/dL   LDL Cholesterol 73 0 - 99 mg/dL    Comment:        Total Cholesterol/HDL:CHD Risk Coronary Heart Disease Risk Table                     Men   Women  1/2 Average Risk   3.4   3.3  Average Risk       5.0   4.4  2 X Average Risk   9.6   7.1  3 X Average Risk  23.4   11.0        Use the calculated Patient Ratio above and the CHD Risk Table to determine the patient's CHD Risk.        ATP III CLASSIFICATION (LDL):  <100     mg/dL   Optimal  100-129  mg/dL   Near or Above                    Optimal  130-159  mg/dL   Borderline  160-189  mg/dL   High  >190     mg/dL   Very High   CBC     Status: Abnormal   Collection Time: 05/26/15  5:02 AM  Result Value Ref Range   WBC 10.4 3.6 - 11.0 K/uL   RBC 4.87 3.80 - 5.20 MIL/uL   Hemoglobin 13.9 12.0 - 16.0 g/dL   HCT 42.6 35.0 - 47.0 %   MCV 87.5 80.0 - 100.0 fL   MCH 28.6 26.0 - 34.0 pg  MCHC 32.7 32.0 - 36.0 g/dL   RDW 15.2 (H) 11.5 - 14.5 %   Platelets 230 150 - 440 K/uL  Comprehensive metabolic panel     Status: Abnormal   Collection Time: 05/26/15  5:02 AM  Result Value Ref Range   Sodium 142 135 - 145 mmol/L   Potassium 3.4 (L) 3.5 - 5.1 mmol/L   Chloride 105 101 - 111 mmol/L   CO2 30 22 - 32 mmol/L   Glucose, Bld 145 (H) 65 - 99 mg/dL   BUN 22 (H) 6 - 20 mg/dL   Creatinine, Ser 0.85 0.44 - 1.00 mg/dL   Calcium 9.0 8.9 - 10.3 mg/dL   Total Protein 7.1 6.5 - 8.1 g/dL   Albumin 3.3 (L) 3.5 - 5.0 g/dL   AST 26 15 - 41 U/L   ALT 28 14 - 54 U/L   Alkaline Phosphatase 58 38 - 126 U/L   Total Bilirubin 0.9 0.3 - 1.2 mg/dL   GFR calc non Af Amer >60 >60 mL/min   GFR calc Af  Amer >60 >60 mL/min    Comment: (NOTE) The eGFR has been calculated using the CKD EPI equation. This calculation has not been validated in all clinical situations. eGFR's persistently <60 mL/min signify possible Chronic Kidney Disease.    Anion gap 7 5 - 15  Glucose, capillary     Status: Abnormal   Collection Time: 05/26/15  7:03 AM  Result Value Ref Range   Glucose-Capillary 130 (H) 65 - 99 mg/dL  Glucose, capillary     Status: Abnormal   Collection Time: 05/26/15 10:58 AM  Result Value Ref Range   Glucose-Capillary 112 (H) 65 - 99 mg/dL    Dg Chest 2 View  05/25/2015  CLINICAL DATA:  Chest pain and left arm tingling and numbness with episodes of feeling hot for the past 4 days. Ex-smoker. EXAM: CHEST  2 VIEW COMPARISON:  10/01/2014. FINDINGS: Borderline enlarged cardiac silhouette. Clear lungs. If the lungs are mildly hyperexpanded with diffuse peribronchial thickening and accentuation of the interstitial markings. Thoracic spine degenerative changes. IMPRESSION: No acute abnormality.  Mild changes of COPD and chronic bronchitis. Electronically Signed   By: Claudie Revering M.D.   On: 05/25/2015 18:03   Ct Head Wo Contrast  05/25/2015  CLINICAL DATA:  Left-sided numbness and nausea for 5 days EXAM: CT HEAD WITHOUT CONTRAST TECHNIQUE: Contiguous axial images were obtained from the base of the skull through the vertex without intravenous contrast. COMPARISON:  January 19, 2015 FINDINGS: The ventricles are normal in size and configuration. There are surgical clips in the anterior circle of Willis region slightly to the right of midline with evidence of previous right frontal and temporal craniotomy. There is no evidence of mass, hemorrhage, extra-axial fluid collection, or midline shift. There is evidence of a prior infarct in the posterior inferior head of the caudate nucleus on the left. There is patchy small vessel disease in the centra semiovale bilaterally. There is probable encephalomalacia in  the posterior right frontal lobe, stable. No new gray-white compartment lesions are identified. No acute infarct evident. The bony calvarium appears intact except for the postoperative change on the right. There is evidence of old left-sided nasal bone fracture. Mastoid air cells clear. IMPRESSION: Stable postoperative change on the right. Prior small infarcts and small vessel disease, stable. No acute infarct evident. No hemorrhage or mass effect. Old fracture left nasal bone. Electronically Signed   By: Lowella Grip III M.D.   On:  05/25/2015 16:27    Review of Systems  Constitutional: Negative.   HENT: Negative.   Eyes: Negative.   Respiratory: Negative.   Cardiovascular: Negative.   Gastrointestinal: Negative.   Genitourinary: Negative.   Musculoskeletal: Negative.   Skin: Negative.   Neurological: Positive for sensory change. Negative for dizziness, tingling, tremors, speech change, focal weakness, seizures and loss of consciousness.  Psychiatric/Behavioral: Negative.    Blood pressure 143/71, pulse 68, temperature 97.9 F (36.6 C), temperature source Oral, resp. rate 24, height 5' 2" (1.575 m), weight 129.547 kg (285 lb 9.6 oz), SpO2 98 %. Physical Exam  Nursing note and vitals reviewed. Constitutional: She is oriented to person, place, and time. She appears well-developed and well-nourished. No distress.  HENT:  Head: Normocephalic and atraumatic.  Right Ear: External ear normal.  Left Ear: External ear normal.  Nose: Nose normal.  Mouth/Throat: Oropharynx is clear and moist.  Eyes: Conjunctivae and EOM are normal. Pupils are equal, round, and reactive to light. No scleral icterus.  Neck: Normal range of motion. Neck supple.  Cardiovascular: Normal rate, regular rhythm, normal heart sounds and intact distal pulses.   No murmur heard. Respiratory: Effort normal and breath sounds normal. She has no wheezes.  GI: Soft. Bowel sounds are normal.  Musculoskeletal: Normal range  of motion. She exhibits no edema or tenderness.  Neurological: She is alert and oriented to person, place, and time. She has normal reflexes. She displays normal reflexes. No cranial nerve deficit. She exhibits normal muscle tone. Coordination normal.  Skin: Skin is warm. She is not diaphoretic.  Psychiatric: She has a normal mood and affect.    Assessment/Plan: 1.  Probable simple partial seizure-  Given the [place]ian march that she describes -  MRI of brain pending -  EEG -  Start Neurotin 367m BID -  Will follow  Kristin Mora 05/26/2015, 11:21 AM

## 2015-05-26 NOTE — Evaluation (Signed)
Physical Therapy Evaluation Patient Details Name: Kristin Mora MRN: [id number] DOB: 01-13-1956 Today's Date: 05/26/2015   History of Present Illness  Patient is a 59 y.o. female presenting to hospital with worsening L facial numbness and concern for cheiro-oral syndrome.  MRI pending.  PMH includes Stroke April 2016 (L sided ischemic stroke), s/p R frontal temporal crani, old L sided nasal bone fx, aneurysm rupture with hemorrhage near thalamus s/p surgical repair.   Clinical Impression  Prior to admission, pt was independent without AD.  Pt lives with her daughter in 1 level apt with 15 steps to enter with railing.  Currently pt is CGA with transfers and ambulation with SPC (pt unsteady without AD ambulating and intermittently trying to hold onto objects to steady herself without AD).  Pt would benefit from skilled PT to address noted impairments and functional limitations.  Recommend pt discharge to home using [facility] and with assist for stairs when medically appropriate (pt declining HHPT at this time).     Follow Up Recommendations Home health PT (pt declining HHPT; assist for stairs)    Equipment Recommendations   (pt has own SPC at home)    Recommendations for Other Services       Precautions / Restrictions Precautions Precautions: Fall Restrictions Weight Bearing Restrictions: No      Mobility  Bed Mobility Overal bed mobility: Needs Assistance Bed Mobility: Supine to Sit;Sit to Supine     Supine to sit: Mod assist;HOB elevated (assist for trunk) Sit to supine: Supervision;HOB elevated   General bed mobility comments: Pt requesting assist to sit up in bed but appears that she may be able to perform this on her own  Transfers Overall transfer level: Needs assistance Equipment used: None Transfers: Sit to/from Stand Sit to Stand: Min guard         General transfer comment: no loss of balance noted; steady  Ambulation/Gait Ambulation/Gait assistance: Min  guard Ambulation Distance (Feet):  (80 feet no AD; 180 feet with SPC) Assistive device: None;Straight cane   Gait velocity: mild decrease   General Gait Details: increased lateral sway without AD and holding onto railing in hallway intermittently to steady herself; trialed SPC and pt steady without loss of balance  Stairs Stairs:  (pt refused)          Wheelchair Mobility    Modified Rankin (Stroke Patients Only)       Balance Overall balance assessment: Needs assistance Sitting-balance support: No upper extremity supported;Feet supported Sitting balance-Leahy Scale: Normal     Standing balance support: No upper extremity supported Standing balance-Leahy Scale: Good Standing balance comment: static standing                             Pertinent Vitals/Pain Pain Assessment: No/denies pain  Vitals stable and WFL throughout treatment session.    Home Living Family/patient expects to be discharged to:: Private residence Living Arrangements: Children (pt's daughter) Available Help at Discharge: Family Type of Home: Apartment Home Access: Stairs to enter Entrance Stairs-Rails: Psychiatric nurse of Steps: [place]: One level [place]: [place] - single point      Prior Function Level of Independence: Independent         Comments: Pt able to perform stairs by herself but takes her time     Hand Dominance   Dominant Hand: Left    Extremity/Trunk Assessment   Upper Extremity Assessment: Defer to OT evaluation  Lower Extremity Assessment: Overall WFL for tasks assessed (except decreased sensation L 5th toe (pt reports no change since last stroke though))         Communication   Communication: No difficulties  Cognition Arousal/Alertness: Awake/alert Behavior During Therapy: WFL for tasks assessed/performed Overall Cognitive Status: Within Functional Limits for tasks assessed                       General Comments   Nursing cleared pt for participation in physical therapy.  Pt agreeable to PT session.    Exercises  Gait training with SPC.      Assessment/Plan    PT Assessment Patient needs continued PT services  PT Diagnosis Difficulty walking   PT Problem List Decreased balance;Decreased mobility  PT Treatment Interventions DME instruction;Gait training;Stair training;Functional mobility training;Therapeutic activities;Therapeutic exercise;Balance training;Patient/family education   PT Goals (Current goals can be found in the Care Plan section) Acute Rehab PT Goals Patient Stated Goal: to go home PT Goal Formulation: With patient Time For Goal Achievement: 06/09/15 Potential to Achieve Goals: Good    Frequency 7X/week   Barriers to discharge        Co-evaluation               End of Session Equipment Utilized During Treatment: Gait belt Activity Tolerance: Patient tolerated treatment well Patient left: in bed;with call bell/phone within reach;with bed alarm set;with nursing/sitter in room;with family/visitor present Nurse Communication: Mobility status;Precautions         Time: [id number] PT Time Calculation (min) (ACUTE ONLY): 28 min   Charges:   PT Evaluation $Initial PT Evaluation Tier I: 1 Procedure PT Treatments $Gait Training: 8-22 mins   PT G CodesLeitha Bleak 06-02-2015, 1:29 PM Leitha Bleak, [place]

## 2015-05-26 NOTE — Clinical Social Work Note (Signed)
Clinical Social Worker was consulted for "Pt unable to work any more since stroke in April 2016. Was denied disability. Having difficulty affording medications." RNCM is following for Medication Management. CSW is signing off as no further needs identified. Please reconsult if a need arises prior to discharge.   Darden Dates, MSW, LCSW Clinical Social Worker  [phone]

## 2015-05-26 NOTE — Care Management (Signed)
Admitted to [facility] with the diagnosis of stroke. Lives with daughter, Jennyfer Abby. [phone]). Sees "Frances Nickels PA at [facility]. Last seen 4 months suppose to have appointment for tomorrow, will call and cancel appointment. Home Health in April following a stroke thru  [facility]. They no longer seen her in the home. No home oxygen. No skilled facility. Uses a cane to aid in ambulation. Take care of all basic activities of daily living herself, does not drive. Tries to cook and clean in the home. Tried to go back to work in July, [employer]), now is trying to get disability. No falls. Appetite O.K. States she gets her medications at [facility], sometimes hart to buy the. Information about [facility] given to Ms. Poertner. Daughter will transport. Shelbie Ammons RN MSN CCM Care Management [phone]

## 2015-05-26 NOTE — Plan of Care (Signed)
Dr. Ree Kida of high BP and gave verbal orders to restart home amlodipine. RN wrote orders.

## 2015-05-26 NOTE — Progress Notes (Signed)
[place] [facility] [place] NAME: Kristin Mora    MR#:  [id number]  DATE OF BIRTH:  1956/01/26  SUBJECTIVE:  Left facial numbness more than her usual. No other weakness  REVIEW OF SYSTEMS:   Review of Systems  Constitutional: Negative for fever, chills and weight loss.  HENT: Negative for ear discharge, ear pain and nosebleeds.   Eyes: Negative for blurred vision, pain and discharge.  Respiratory: Negative for sputum production, shortness of breath, wheezing and stridor.   Cardiovascular: Negative for chest pain, palpitations, orthopnea and PND.  Gastrointestinal: Negative for nausea, vomiting, abdominal pain and diarrhea.  Genitourinary: Negative for urgency and frequency.  Musculoskeletal: Negative for back pain and joint pain.  Neurological: Positive for sensory change and weakness. Negative for speech change and focal weakness.  Psychiatric/Behavioral: Negative for depression and hallucinations. The patient is not nervous/anxious.   All other systems reviewed and are negative.  Tolerating Diet:yes Tolerating PT: HHPT  DRUG ALLERGIES:   Allergies  Allergen Reactions  . Lisinopril Swelling  . Latex Rash    VITALS:  Blood pressure 151/65, pulse 73, temperature 98.1 F (36.7 C), temperature source Oral, resp. rate 19, height 5\' 2"  (1.575 m), weight 285 lb 9.6 oz (129.547 kg), SpO2 97 %.  PHYSICAL EXAMINATION:   Physical Exam  GENERAL:  59 y.o.-year-old patient lying in the bed with no acute distress.  EYES: Pupils equal, round, reactive to light and accommodation. No scleral icterus. Extraocular muscles intact.  HEENT: Head atraumatic, normocephalic. Oropharynx and nasopharynx clear.  NECK:  Supple, no jugular venous distention. No thyroid enlargement, no tenderness.  LUNGS: Normal breath sounds bilaterally, no wheezing, rales, rhonchi. No use of accessory muscles of respiration.  CARDIOVASCULAR: S1, S2 normal. No murmurs,  rubs, or gallops.  ABDOMEN: Soft, nontender, nondistended. Bowel sounds present. No organomegaly or mass.  EXTREMITIES: No cyanosis, clubbing or edema b/l.    NEUROLOGIC: Cranial nerves II through XII are intact. No focal Motor or sensory deficits b/l.   PSYCHIATRIC: The patient is alert and oriented x 3.  SKIN: No obvious rash, lesion, or ulcer.    LABORATORY PANEL:   CBC  Recent Labs Lab 05/26/15 0502  WBC 10.4  HGB 13.9  HCT 42.6  PLT 230    Chemistries   Recent Labs Lab 05/26/15 0502  NA 142  K 3.4*  CL 105  CO2 30  GLUCOSE 145*  BUN 22*  CREATININE 0.85  CALCIUM 9.0  AST 26  ALT 28  ALKPHOS 58  BILITOT 0.9    Cardiac Enzymes  Recent Labs Lab 05/25/15 1552  TROPONINI <0.03    RADIOLOGY:  Dg Chest 2 View  05/25/2015  CLINICAL DATA:  Chest pain and left arm tingling and numbness with episodes of feeling hot for the past 4 days. Ex-smoker. EXAM: CHEST  2 VIEW COMPARISON:  10/01/2014. FINDINGS: Borderline enlarged cardiac silhouette. Clear lungs. If the lungs are mildly hyperexpanded with diffuse peribronchial thickening and accentuation of the interstitial markings. Thoracic spine degenerative changes. IMPRESSION: No acute abnormality.  Mild changes of COPD and chronic bronchitis. Electronically Signed   By: Claudie Revering M.D.   On: 05/25/2015 18:03   Ct Head Wo Contrast  05/25/2015  CLINICAL DATA:  Left-sided numbness and nausea for 5 days EXAM: CT HEAD WITHOUT CONTRAST TECHNIQUE: Contiguous axial images were obtained from the base of the skull through the vertex without intravenous contrast. COMPARISON:  January 19, 2015 FINDINGS: The ventricles are normal  in size and configuration. There are surgical clips in the anterior circle of Willis region slightly to the right of midline with evidence of previous right frontal and temporal craniotomy. There is no evidence of mass, hemorrhage, extra-axial fluid collection, or midline shift. There is evidence of a prior  infarct in the posterior inferior head of the caudate nucleus on the left. There is patchy small vessel disease in the centra semiovale bilaterally. There is probable encephalomalacia in the posterior right frontal lobe, stable. No new gray-white compartment lesions are identified. No acute infarct evident. The bony calvarium appears intact except for the postoperative change on the right. There is evidence of old left-sided nasal bone fracture. Mastoid air cells clear. IMPRESSION: Stable postoperative change on the right. Prior small infarcts and small vessel disease, stable. No acute infarct evident. No hemorrhage or mass effect. Old fracture left nasal bone. Electronically Signed   By: Lowella Grip III M.D.   On: 05/25/2015 16:27     ASSESSMENT AND PLAN:  Kristin Mora is a 59 y.o. female who presents with worsening left facial numbness. She has a prior history of strokes, including aneurysm rupture with hemorrhage near her thalamus status post surgical repair. She also has a history of left-sided ischemic stroke 8 months ago. She states that 4 days ago she had the same feeling she had when she had her stroke earlier this year, she was diaphoretic and very flushed.  * left facial numbness Pt has long standing h/o hemorrhagic stroke and clipping of aneurysm in the past -no neuro deficits -seen by Dr Jayme Cloud and thinks she could have partial simple seizures. MRI/MRA brain pending -started on po neurontin 300 mg bid -neurochecks  *Diabetes mellitus, type 2 ([place]) - continue home dose metformin once patient is eating after her swallow eval. Before meals at bedtime fingerstick checks. Hemoglobin A1c has above. Can add sliding scale insulin if necessary.  * Essential (primary) hypertension - permissive hypertension for the first 24 hours with BP goal less than 220/110. Goal less than 160/100 and subsequently and will need home antihypertensives restarted at that time.  * Hypercholesteremia  - continue home statin   Case discussed with Care Management/Social Worker. Management plans discussed with the patient, family and they are in agreement.  CODE STATUS:full  DVT Prophylaxis: heparin  TOTAL TIME TAKING CARE OF THIS PATIENT: 30 minutes.  >50% time spent on counselling and coordination of care  POSSIBLE D/C IN 1 DAYS, DEPENDING ON CLINICAL CONDITION.   Mistie Adney M.D on 05/26/2015 at 4:37 PM  Between 7am to 6pm - Pager - [phone]  After 6pm go to [email or web address] - password [email or web address] [facility]  [facility]  Office  [phone]  CC: Primary care physician; Carmon Ginsberg, PA

## 2015-05-26 NOTE — Progress Notes (Signed)
Speech Therapy Note:  Reviewed chart, consulted NSG and spoke with Pt. Pt reports no concerns with speech, language or swallowing function and consumed breakfast with no reported symptoms of concern (e.g., choking, coughing, throat clear, strangling). . Pt is on a regular consistency diet with thin liquids and reports she is tolerating well with no overt or immediate s/s of aspiration. When asked directly by ST, Pt stated she is at her baseline in terms of speech, language and swallowing and has no concerns at this time. Counseled Pt that should future concerns arise while she is admitted, ST is available for future follow-up. NSG updated.

## 2015-05-26 NOTE — Progress Notes (Signed)
*  PRELIMINARY RESULTS* Echocardiogram 2D Echocardiogram has been performed.  Kristin Mora 05/26/2015, 7:45 AM

## 2015-05-27 ENCOUNTER — Ambulatory Visit: Payer: BLUE CROSS/BLUE SHIELD | Admitting: Family Medicine

## 2015-05-27 ENCOUNTER — Telehealth: Payer: Self-pay | Admitting: Family Medicine

## 2015-05-27 LAB — GLUCOSE, CAPILLARY
GLUCOSE-CAPILLARY: 104 mg/dL — AB (ref 65–99)
GLUCOSE-CAPILLARY: 91 mg/dL (ref 65–99)
GLUCOSE-CAPILLARY: 96 mg/dL (ref 65–99)
Glucose-Capillary: 138 mg/dL — ABNORMAL HIGH (ref 65–99)

## 2015-05-27 MED ORDER — GABAPENTIN 600 MG PO TABS: 300.0000 mg | ORAL_TABLET | Freq: Two times a day (BID) | ORAL | Status: DC

## 2015-05-27 NOTE — Plan of Care (Signed)
Problem: Education: Goal: Knowledge of disease or condition will improve Outcome: Progressing Pt w/hx of hemorrhagic CVA in April 2016 and aneurysm rupture near thalamus s/p surgical repair - here w/Lsided facial and arm numbness - although pt describes it more as tingling.  Neurologist thinks this event was simple partial seizure.  Gabapentin inc to 300 mg tid. Pt can feel sites on face and arm and has good strength.  Reviewed education on risk factors of stroke and emphasized that she is high risk.  Important to eat well (we reviewed vegetables, lean meat), she is also trying to exercise.  Strongly advised pt that if she has any stroke symptoms, which we reviewed, to call EMS, not come in w/family member.   Tried to help pt get medical records - she's trying to get on disability.  Reviewed f/u appts.  IV removed.  Pt will go home w/daughter, w/whom she lives.

## 2015-05-27 NOTE — Discharge Summary (Signed)
[facility] [place] NAME: Kristin Mora    MR#:  [id number]  DATE OF BIRTH:  1955-08-21  DATE OF ADMISSION:  05/25/2015 ADMITTING PHYSICIAN: Lance Coon, MD  DATE OF DISCHARGE: 11/301/6  PRIMARY CARE PHYSICIAN: Carmon Ginsberg, PA    ADMISSION DIAGNOSIS:  Cerebrovascular accident (CVA), unspecified mechanism ([place]) [I63.9]  DISCHARGE DIAGNOSIS:  Suspected left partial simple seizures Known h/o hemorrhagic CVA in April 2016  SECONDARY DIAGNOSIS:   Past Medical History  Diagnosis Date  . Brain aneurysm 04/2007  . Hypertension     controlled with medication  . Diabetes mellitus without complication ([place])     controlled;  . Stroke ([place])   . Asthma   . HLD (hyperlipidemia)     HOSPITAL COURSE:   Kristin Mora is a 59 y.o. female who presents with worsening left facial numbness. She has a prior history of strokes, including aneurysm rupture with hemorrhage near her thalamus status post surgical repair. She also has a history of left-sided ischemic stroke 8 months ago. She states that 4 days ago she had the same feeling she had when she had her stroke earlier this year, she was diaphoretic and very flushed.  * left facial numbness Pt has long standing h/o hemorrhagic stroke and clipping of aneurysm in the past -no neuro deficits -seen by Dr Jayme Cloud and thinks she could have partial simple seizures. -RI/MRA brain cancelled since she declined -started on po neurontin 300 mg bid -no neuro deficits  *Diabetes mellitus, type 2 ([place]) - continue home dose metformin  * Essential (primary) hypertension - permissive hypertension for the first 24 hours with BP goal less than 220/110. Goal less than 160/100 and subsequently and will need home antihypertensives restarted at that time. resume home meds  * Hypercholesteremia - continue home statin  EEG results pending but per Neuro pt ok to d/c home CONSULTS OBTAINED:    Dr  Emilee Hero DRUG ALLERGIES:   Allergies  Allergen Reactions  . Lisinopril Swelling  . Latex Rash    DISCHARGE MEDICATIONS:   Current Discharge Medication List    START taking these medications   Details  gabapentin (NEURONTIN) 600 MG tablet Take 0.5 tablets (300 mg total) by mouth 2 (two) times daily. Qty: 60 tablet, Refills: 3      CONTINUE these medications which have NOT CHANGED   Details  amLODipine (NORVASC) 5 MG tablet Take 5 mg by mouth daily.    atorvastatin (LIPITOR) 40 MG tablet Take 40 mg by mouth every evening.    losartan (COZAAR) 50 MG tablet Take 50 mg by mouth daily.    metFORMIN (GLUCOPHAGE) 500 MG tablet Take 500 mg by mouth 2 (two) times daily.        If you experience worsening of your admission symptoms, develop shortness of breath, life threatening emergency, suicidal or homicidal thoughts you must seek medical attention immediately by calling 911 or calling your MD immediately  if symptoms less severe.  You Must read complete instructions/literature along with all the possible adverse reactions/side effects for all the Medicines you take and that have been prescribed to you. Take any new Medicines after you have completely understood and accept all the possible adverse reactions/side effects.   Please note  You were cared for by a hospitalist during your hospital stay. If you have any questions about your discharge medications or the care you received while you were in the hospital after you are discharged, you can  call the unit and asked to speak with the hospitalist on call if the hospitalist that took care of you is not available. Once you are discharged, your primary care physician will handle any further medical issues. Please note that NO REFILLS for any discharge medications will be authorized once you are discharged, as it is imperative that you return to your primary care physician (or establish a relationship with a primary care physician if you  do not have one) for your aftercare needs so that they can reassess your need for medications and monitor your lab values. Today   SUBJECTIVE   No new complaints  VITAL SIGNS:  Blood pressure 140/68, pulse 71, temperature 98.5 F (36.9 C), temperature source Oral, resp. rate 20, height 5\' 2"  (1.575 m), weight 285 lb 9.6 oz (129.547 kg), SpO2 96 %.  I/O:   Intake/Output Summary (Last 24 hours) at 05/27/15 1300 Last data filed at 05/27/15 0930  Gross per 24 hour  Intake    360 ml  Output      0 ml  Net    360 ml    PHYSICAL EXAMINATION:  GENERAL:  59 y.o.-year-old patient lying in the bed with no acute distress.  EYES: Pupils equal, round, reactive to light and accommodation. No scleral icterus. Extraocular muscles intact.  HEENT: Head atraumatic, normocephalic. Oropharynx and nasopharynx clear.  NECK:  Supple, no jugular venous distention. No thyroid enlargement, no tenderness.  LUNGS: Normal breath sounds bilaterally, no wheezing, rales,rhonchi or crepitation. No use of accessory muscles of respiration.  CARDIOVASCULAR: S1, S2 normal. No murmurs, rubs, or gallops.  ABDOMEN: Soft, non-tender, non-distended. Bowel sounds present. No organomegaly or mass.  EXTREMITIES: No pedal edema, cyanosis, or clubbing.  NEUROLOGIC: Cranial nerves II through XII are intact. Muscle strength 5/5 in all extremities. Sensation intact. Gait not checked.  PSYCHIATRIC: The patient is alert and oriented x 3.  SKIN: No obvious rash, lesion, or ulcer.   DATA REVIEW:   CBC   Recent Labs Lab 05/26/15 0502  WBC 10.4  HGB 13.9  HCT 42.6  PLT 230    Chemistries   Recent Labs Lab 05/26/15 0502  NA 142  K 3.4*  CL 105  CO2 30  GLUCOSE 145*  BUN 22*  CREATININE 0.85  CALCIUM 9.0  AST 26  ALT 28  ALKPHOS 58  BILITOT 0.9    Microbiology Results   No results found for this or any previous visit (from the past 240 hour(s)).  RADIOLOGY:  Dg Chest 2 View  05/25/2015  CLINICAL DATA:   Chest pain and left arm tingling and numbness with episodes of feeling hot for the past 4 days. Ex-smoker. EXAM: CHEST  2 VIEW COMPARISON:  10/01/2014. FINDINGS: Borderline enlarged cardiac silhouette. Clear lungs. If the lungs are mildly hyperexpanded with diffuse peribronchial thickening and accentuation of the interstitial markings. Thoracic spine degenerative changes. IMPRESSION: No acute abnormality.  Mild changes of COPD and chronic bronchitis. Electronically Signed   By: Claudie Revering M.D.   On: 05/25/2015 18:03   Ct Head Wo Contrast  05/25/2015  CLINICAL DATA:  Left-sided numbness and nausea for 5 days EXAM: CT HEAD WITHOUT CONTRAST TECHNIQUE: Contiguous axial images were obtained from the base of the skull through the vertex without intravenous contrast. COMPARISON:  January 19, 2015 FINDINGS: The ventricles are normal in size and configuration. There are surgical clips in the anterior circle of Willis region slightly to the right of midline with evidence of previous right frontal and  temporal craniotomy. There is no evidence of mass, hemorrhage, extra-axial fluid collection, or midline shift. There is evidence of a prior infarct in the posterior inferior head of the caudate nucleus on the left. There is patchy small vessel disease in the centra semiovale bilaterally. There is probable encephalomalacia in the posterior right frontal lobe, stable. No new gray-white compartment lesions are identified. No acute infarct evident. The bony calvarium appears intact except for the postoperative change on the right. There is evidence of old left-sided nasal bone fracture. Mastoid air cells clear. IMPRESSION: Stable postoperative change on the right. Prior small infarcts and small vessel disease, stable. No acute infarct evident. No hemorrhage or mass effect. Old fracture left nasal bone. Electronically Signed   By: Lowella Grip III M.D.   On: 05/25/2015 16:27     Management plans discussed with the patient,  family and they are in agreement.  CODE STATUS:     Code Status Orders        Start     Ordered   05/25/15 2329  Full code   Continuous     05/25/15 2328      TOTAL TIME TAKING CARE OF THIS PATIENT: 40 minutes.    Nolia Tschantz M.D on 05/27/2015 at 1:00 PM  Between 7am to 6pm - Pager - [phone] After 6pm go to [email or web address] - password [email or web address] [facility]  [facility]  Office  [phone]  CC: Primary care physician; Carmon Ginsberg, PA

## 2015-05-27 NOTE — Progress Notes (Signed)
NEUROLOGY  S: Pt was too claustrophobic for MRI, numbness is almost gone  ROS neg x 8 systems  O: 98.5    140/68    71    20 NAD, obese Normocephalic, oropharynx clear Supple, no LAD CTA B, no wheezing RRR, no murmur No C/C/E  A+Ox4, nl speech and language PERRLA, EOMI, face symmetric 5/5 B  A/P: 1. Simple partial seizure-  Stable now 2.  Old infarct-  Stable -  Increase Neurotin to 300mg  TID PO  -  Continue ASA 81mg  daily and lipitor 40mg  daily -  Keep SBP < 130/80 -  Good DM control with Hem A1c < 6 -  Will sign off, please call with questions -  Needs to f/u with [facility] Neuro in 3 months

## 2015-05-27 NOTE — Plan of Care (Signed)
Problem: Education: Goal: Knowledge of [facility] Education information/materials will improve Outcome: Progressing No complaints of pain.  Problem: Safety: Goal: Ability to remain free from injury will improve Outcome: Progressing On moderate fall precaution.     Problem: Education: Goal: Knowledge of disease or condition will improve Stroke education provided.

## 2015-05-27 NOTE — Telephone Encounter (Signed)
[place] scheduled pt for hospital F/U. Pt was treated for possible stroke and is being discharged today 05/27/15. Thanks TNP

## 2015-05-27 NOTE — Telephone Encounter (Signed)
FYI

## 2015-05-29 ENCOUNTER — Telehealth: Payer: Self-pay | Admitting: Family Medicine

## 2015-05-29 ENCOUNTER — Other Ambulatory Visit: Payer: Self-pay | Admitting: Family Medicine

## 2015-05-29 DIAGNOSIS — I10 Essential (primary) hypertension: Secondary | ICD-10-CM

## 2015-05-29 MED ORDER — AMLODIPINE BESYLATE 5 MG PO TABS: 5.0000 mg | ORAL_TABLET | Freq: Every day | ORAL | Status: DC

## 2015-05-29 NOTE — Telephone Encounter (Signed)
Refill request

## 2015-05-29 NOTE — Telephone Encounter (Signed)
Pt needs refill amLODipine (NORVASC) 5 MG tablet   [facility] [address]  Call back is [phone]  Thanks Con Memos

## 2015-06-03 ENCOUNTER — Ambulatory Visit (INDEPENDENT_AMBULATORY_CARE_PROVIDER_SITE_OTHER): Payer: Self-pay | Admitting: Family Medicine

## 2015-06-03 ENCOUNTER — Encounter: Payer: Self-pay | Admitting: Family Medicine

## 2015-06-03 VITALS — BP 126/66 | HR 73 | Temp 98.5°F | Resp 16 | Wt 295.6 lb

## 2015-06-03 DIAGNOSIS — G40909 Epilepsy, unspecified, not intractable, without status epilepticus: Secondary | ICD-10-CM

## 2015-06-03 DIAGNOSIS — E119 Type 2 diabetes mellitus without complications: Secondary | ICD-10-CM

## 2015-06-03 DIAGNOSIS — I10 Essential (primary) hypertension: Secondary | ICD-10-CM

## 2015-06-03 NOTE — Addendum Note (Signed)
Addended by: Quay Burow on: 06/03/2015 09:41 AM   Modules accepted: Level of Service, SmartSet

## 2015-06-03 NOTE — Progress Notes (Signed)
Subjective:     Patient ID: Kristin Mora, female   DOB: 24-Mar-1956, 59 y.o.   MRN: [id number]  HPI  Chief Complaint  Patient presents with  . Hospitalization Follow-up    Patient was admitted to [facility] on 11/28 with complaints of numbness on her left side of face, per telemetry neurologist he believed it ws possible cheiro-oral syndrome. Patient was discharged on 11/30 with diagnosis of suspected left partial simple seizures. Patient has follow up appt with Neurology on 12/21, and she is currently taking Gabapentin 600mg  but is not taking Aspirin 81 mg  . Hypertension    Last office visit ws 01/09/15, blood pressure in house was 130/74, patient was advised to continue Losartan 50mg  and Amlodipine 5mg . Patient was encouraged to increase daily walking to 30mg . Patient reports good compliance and tolerance f medication  States she has f/u with her [facility] neurologist on 12/21. A!C during her hospitalization was 6.3%. EEG during her hospitalization did not show any seizure activity.   Review of Systems  Neurological:       "I feel the numbness coming back if I get hot"       Objective:   Physical Exam  Constitutional: She appears well-developed and well-nourished. She appears distressed.  Cardiovascular: Normal rate and regular rhythm.   Pulmonary/Chest: Breath sounds normal.  Musculoskeletal: She exhibits no edema (of lower extremities).       Assessment:    1. Type 2 diabetes mellitus without complication, without long-term current use of insulin ([place]): controlled  2. Essential (primary) hypertension: stable  3. Seizure disorder [facility]): pending further neurology f/u.     Plan:    F/u with neurology as scheduled. Ask if ok to start aspirin due to hx of hemorrhagic stroke. Three month f/u of diabetes and hypertension.

## 2015-06-03 NOTE — Patient Instructions (Signed)
Follow up with neurology as scheduled. Ask about whether you can be on a 81 mg. aspirin daily. Resume your walking program when you are able.

## 2015-06-18 ENCOUNTER — Emergency Department
Admission: EM | Admit: 2015-06-18 | Discharge: 2015-06-18 | Disposition: A | Discharge status: Home / Self Care | Payer: BLUE CROSS/BLUE SHIELD | Attending: Emergency Medicine | Admitting: Emergency Medicine

## 2015-06-18 DIAGNOSIS — H578 Other specified disorders of eye and adnexa: Secondary | ICD-10-CM | POA: Insufficient documentation

## 2015-06-18 DIAGNOSIS — Z91040 Latex allergy status: Secondary | ICD-10-CM | POA: Insufficient documentation

## 2015-06-18 DIAGNOSIS — R209 Unspecified disturbances of skin sensation: Secondary | ICD-10-CM

## 2015-06-18 DIAGNOSIS — E119 Type 2 diabetes mellitus without complications: Secondary | ICD-10-CM | POA: Insufficient documentation

## 2015-06-18 DIAGNOSIS — R200 Anesthesia of skin: Secondary | ICD-10-CM | POA: Insufficient documentation

## 2015-06-18 DIAGNOSIS — R202 Paresthesia of skin: Secondary | ICD-10-CM | POA: Insufficient documentation

## 2015-06-18 DIAGNOSIS — Z79899 Other long term (current) drug therapy: Secondary | ICD-10-CM | POA: Insufficient documentation

## 2015-06-18 DIAGNOSIS — I10 Essential (primary) hypertension: Secondary | ICD-10-CM | POA: Insufficient documentation

## 2015-06-18 DIAGNOSIS — Z7984 Long term (current) use of oral hypoglycemic drugs: Secondary | ICD-10-CM | POA: Insufficient documentation

## 2015-06-18 DIAGNOSIS — Z87891 Personal history of nicotine dependence: Secondary | ICD-10-CM | POA: Insufficient documentation

## 2015-06-18 NOTE — ED Notes (Signed)
Per EMS patient c/o swelling to left eye. Patient hx of numbness to left side of face since her stroke in April 2016. Hx of facial seizures. Patient stopped taking her Sabapeatin yesterday. Patient currently A&O x4. Speaking in full sentences. No current distress noted at this time.

## 2015-06-18 NOTE — Discharge Instructions (Signed)
Paresthesia Paresthesia is an abnormal burning or prickling sensation. This sensation is generally felt in the hands, arms, legs, or feet. However, it may occur in any part of the body. Usually, it is not painful. The feeling may be described as:  Tingling or numbness.  Pins and needles.  Skin crawling.  Buzzing.  Limbs falling asleep.  Itching. Most people experience temporary (transient) paresthesia at some time in their lives. Paresthesia may occur when you breathe too quickly (hyperventilation). It can also occur without any apparent cause. Commonly, paresthesia occurs when pressure is placed on a nerve. The sensation quickly goes away after the pressure is removed. For some people, however, paresthesia is a long-lasting (chronic) condition that is caused by an underlying disorder. If you continue to have paresthesia, you may need further medical evaluation. HOME CARE INSTRUCTIONS Watch your condition for any changes. Taking the following actions may help to lessen any discomfort that you are feeling:  Avoid drinking alcohol.  Try acupuncture or massage to help relieve your symptoms.  Keep all follow-up visits as directed by your health care provider. This is important. SEEK MEDICAL CARE IF:  You continue to have episodes of paresthesia.  Your burning or prickling feeling gets worse when you walk.  You have pain, cramps, or dizziness.  You develop a rash. SEEK IMMEDIATE MEDICAL CARE IF:  You feel weak.  You have trouble walking or moving.  You have problems with speech, understanding, or vision.  You feel confused.  You cannot control your bladder or bowel movements.  You have numbness after an injury.  You faint.   This information is not intended to replace advice given to you by your health care provider. Make sure you discuss any questions you have with your health care provider.   Document Released: 06/03/2002 Document Revised: 10/28/2014 Document Reviewed:  06/09/2014 Elsevier Interactive Patient Education 2016 Elsevier Inc.  

## 2015-06-18 NOTE — ED Provider Notes (Signed)
[facility] Emergency Department Provider Note  ____________________________________________  Time seen: 3:15 PM on arrival by EMS  I have reviewed the triage vital signs and the nursing notes.   HISTORY  Chief Complaint Facial Swelling    HPI Kristin Mora is a 59 y.o. female who reports chronic left facial paresthesia of this been going on for many months. This is unchanged and is always present. She followed up with [facility] neurology yesterday who stated to the patient that they are unsure the cause of this and why she sometimes feels like she gets a sensation of warmth in the left side of the face as well.They told her to stop taking gabapentin yesterday, and today she reports that her left eye feels puffy. No new numbness tingling or weakness or vision changes. No fevers cough shortness of breath or chest pain. No neck pain.  Review of available records including the neurology note from yesterday indicate that the patient has a history of a thalamic hemorrhage that caused left-sided paresthesia and weakness. The symptoms have all resolved except for the persistent left facial paresthesia. According to the progress note, neurology suspects that her symptoms may be related to anxiety although they are not able to attribute her symptoms to any particular disease process with confidence.   Past Medical History  Diagnosis Date  . Brain aneurysm 04/2007  . Hypertension     controlled with medication  . Diabetes mellitus without complication ([place])     controlled;  . Stroke ([place])   . Asthma   . HLD (hyperlipidemia)      Patient Active Problem List   Diagnosis Date Noted  . Chronic airway obstruction ([place]) 11/04/2014  . Avitaminosis D 11/04/2014  . Adiposity 11/04/2014  . Hypercholesteremia 11/04/2014  . Hemorrhagic cerebrovascular accident (CVA) ([place]) 11/04/2014  . Diabetes mellitus, type 2 ([place]) 10/01/2014  . Essential (primary) hypertension 10/01/2014   . Thalamic hemorrhage ([place]) 10/01/2014     Past Surgical History  Procedure Laterality Date  . Tubal ligation    . Tonsillectomy and adenoidectomy    . Craniotomy  04/2007  . Knee surgery       Current Outpatient Rx  Name  Route  Sig  Dispense  Refill  . amLODipine (NORVASC) 5 MG tablet   Oral   Take 1 tablet (5 mg total) by mouth daily.   30 tablet   5   . atorvastatin (LIPITOR) 40 MG tablet   Oral   Take 40 mg by mouth every evening.         . gabapentin (NEURONTIN) 600 MG tablet   Oral   Take 0.5 tablets (300 mg total) by mouth 2 (two) times daily.   60 tablet   3   . losartan (COZAAR) 50 MG tablet   Oral   Take 50 mg by mouth daily.         . metFORMIN (GLUCOPHAGE) 500 MG tablet   Oral   Take 500 mg by mouth 2 (two) times daily.            Allergies Lisinopril and Latex   Family History  Problem Relation Age of Onset  . Heart attack Mother   . Coronary artery disease Mother   . Lung cancer Father   . Diabetes Sister   . Stroke Brother   . Uterine cancer Sister   . Lung cancer Sister   . Bone cancer Brother     Social History Social History  Substance Use  Topics  . Smoking status: Former Smoker    Quit date: 11/25/2009  . Smokeless tobacco: None  . Alcohol Use: No    Review of Systems  Constitutional:   No fever or chills. No weight changes Eyes:   No blurry vision or double vision.  ENT:   No sore throat. Cardiovascular:   No chest pain. Respiratory:   No dyspnea or cough. Gastrointestinal:   Negative for abdominal pain, vomiting and diarrhea.  No BRBPR or melena. Genitourinary:   Negative for dysuria, urinary retention, bloody urine, or difficulty urinating. Musculoskeletal:   Negative for back pain. No joint swelling or pain. Skin:   Negative for rash. Neurological:   Negative for headaches, focal weakness. Chronic left facial numbness Psychiatric:  No anxiety or depression.   Endocrine:  No hot/cold intolerance, changes  in energy, or sleep difficulty.  10-point ROS otherwise negative.  ____________________________________________   PHYSICAL EXAM:  VITAL SIGNS: ED Triage Vitals  Enc Vitals Group     BP 06/18/15 1525 140/100 mmHg     Pulse Rate 06/18/15 1525 80     Resp 06/18/15 1525 17     Temp 06/18/15 1525 97.3 F (36.3 C)     Temp Source 06/18/15 1525 Oral     SpO2 06/18/15 1525 95 %     Weight 06/18/15 1525 290 lb (131.543 kg)     Height 06/18/15 1525 5\' 2"  (1.575 m)     Head Cir --      Peak Flow --      Pain Score --      Pain Loc --      Pain Edu? --      Excl. in [place]? --     Vital signs reviewed, nursing assessments reviewed.   Constitutional:   Alert and oriented. Well appearing and in no distress. Eyes:   No scleral icterus. No conjunctival pallor. PERRL. EOMI ENT   Head:   Normocephalic and atraumatic. Very slightly perceptible puffiness of the left upper eyelid. No erythema induration tenderness or other inflammatory changes.   Nose:   No congestion/rhinnorhea. No septal hematoma   Mouth/Throat:   MMM, no pharyngeal erythema. No peritonsillar mass. No uvula shift.   Neck:   No stridor. No SubQ emphysema. No meningismus. Hematological/Lymphatic/Immunilogical:   No cervical lymphadenopathy. Cardiovascular:   RRR. Normal and symmetric distal pulses are present in all extremities. No murmurs, rubs, or gallops. Respiratory:   Normal respiratory effort without tachypnea nor retractions. Breath sounds are clear and equal bilaterally. No wheezes/rales/rhonchi. Gastrointestinal:   Soft and nontender. No distention. There is no CVA tenderness.  No rebound, rigidity, or guarding. Genitourinary:   deferred Musculoskeletal:   Nontender with normal range of motion in all extremities. No joint effusions.  No lower extremity tenderness.  No edema. Neurologic:   Normal speech and language.  CN 2-10 normal. Motor grossly intact. No pronator drift.  Normal gait. No gross focal  neurologic deficits are appreciated.  Skin:    Skin is warm, dry and intact. No rash noted.  No petechiae, purpura, or bullae. Psychiatric:   Mood and affect are normal. Speech and behavior are normal. Patient exhibits appropriate insight and judgment.  ____________________________________________    LABS (pertinent positives/negatives) (all labs ordered are listed, but only abnormal results are displayed) Labs Reviewed - No data to display ____________________________________________   EKG    ____________________________________________    RADIOLOGY    ____________________________________________   PROCEDURES   ____________________________________________   INITIAL IMPRESSION /  ASSESSMENT AND PLAN / ED COURSE  Pertinent labs & imaging results that were available during my care of the patient were reviewed by me and considered in my medical decision making (see chart for details).  Patient presents with chronic left facial paresthesia. No new findings. She has no other complaints. On further discussion with the patient it seems that part of her concern today is that she was not happy with the neurologist [facility] yesterday and like to see a new neurologist. She has indicated that she will try to follow up with Dr. Mina Marble of neurology who she has seen in the past. I encouraged her to follow up with them in another week so they can monitor any change in symptoms. There are no convincing physical findings on exam at present time. Similar to the neurologists assessment, I find her symptoms per perplexing and I'm unable to pinpoint a cause, but I have low suspicion for stroke or intracranial hemorrhage, encephalitis meningitis, temporal arteritis vertebrobasilar insufficiency or elevated ICP. She is very well-appearing and appears to be at her chronic baseline. She may be experiencing some sort of autonomic dysfunction that is focal to her left face related to the old thalamic  injury.     ____________________________________________   FINAL CLINICAL IMPRESSION(S) / ED DIAGNOSES  Final diagnoses:  Facial paresthesia   chronic    Carrie Mew, MD 06/18/15 [phone]

## 2015-08-14 ENCOUNTER — Other Ambulatory Visit: Payer: Self-pay | Admitting: Family Medicine

## 2015-08-14 DIAGNOSIS — E119 Type 2 diabetes mellitus without complications: Secondary | ICD-10-CM

## 2015-08-14 MED ORDER — METFORMIN HCL 500 MG PO TABS: 500.0000 mg | ORAL_TABLET | Freq: Two times a day (BID) | ORAL | Status: DC

## 2015-08-14 NOTE — Telephone Encounter (Signed)
Medication refilled

## 2015-09-02 ENCOUNTER — Ambulatory Visit: Payer: Self-pay | Admitting: Family Medicine

## 2015-09-13 ENCOUNTER — Inpatient Hospital Stay
Admission: EM | Admit: 2015-09-13 | Discharge: 2015-09-17 | DRG: 065 | Disposition: A | Discharge status: Home / Self Care | Payer: Self-pay | Attending: Internal Medicine | Admitting: Internal Medicine

## 2015-09-13 ENCOUNTER — Emergency Department: Payer: Self-pay

## 2015-09-13 DIAGNOSIS — Z823 Family history of stroke: Secondary | ICD-10-CM

## 2015-09-13 DIAGNOSIS — Z7984 Long term (current) use of oral hypoglycemic drugs: Secondary | ICD-10-CM

## 2015-09-13 DIAGNOSIS — Z79899 Other long term (current) drug therapy: Secondary | ICD-10-CM

## 2015-09-13 DIAGNOSIS — E119 Type 2 diabetes mellitus without complications: Secondary | ICD-10-CM | POA: Diagnosis present

## 2015-09-13 DIAGNOSIS — Z6841 Body Mass Index (BMI) 40.0 and over, adult: Secondary | ICD-10-CM

## 2015-09-13 DIAGNOSIS — I639 Cerebral infarction, unspecified: Principal | ICD-10-CM | POA: Diagnosis present

## 2015-09-13 DIAGNOSIS — R42 Dizziness and giddiness: Secondary | ICD-10-CM | POA: Diagnosis present

## 2015-09-13 DIAGNOSIS — J449 Chronic obstructive pulmonary disease, unspecified: Secondary | ICD-10-CM | POA: Diagnosis present

## 2015-09-13 DIAGNOSIS — E785 Hyperlipidemia, unspecified: Secondary | ICD-10-CM | POA: Diagnosis present

## 2015-09-13 DIAGNOSIS — Z87891 Personal history of nicotine dependence: Secondary | ICD-10-CM

## 2015-09-13 DIAGNOSIS — Z8673 Personal history of transient ischemic attack (TIA), and cerebral infarction without residual deficits: Secondary | ICD-10-CM

## 2015-09-13 DIAGNOSIS — I10 Essential (primary) hypertension: Secondary | ICD-10-CM | POA: Diagnosis present

## 2015-09-13 DIAGNOSIS — R29700 NIHSS score 0: Secondary | ICD-10-CM | POA: Diagnosis present

## 2015-09-13 LAB — URINALYSIS COMPLETE WITH MICROSCOPIC (ARMC ONLY)
BILIRUBIN URINE: NEGATIVE
Bacteria, UA: NONE SEEN
GLUCOSE, UA: NEGATIVE mg/dL
HGB URINE DIPSTICK: NEGATIVE
KETONES UR: NEGATIVE mg/dL
Leukocytes, UA: NEGATIVE
Nitrite: NEGATIVE
Protein, ur: NEGATIVE mg/dL
SPECIFIC GRAVITY, URINE: 1.012 (ref 1.005–1.030)
pH: 8 (ref 5.0–8.0)

## 2015-09-13 MED ORDER — MECLIZINE HCL 25 MG PO TABS
25.0000 mg | ORAL_TABLET | Freq: Once | ORAL | Status: AC
  Administered 2015-09-14: 25 mg via ORAL
  Filled 2015-09-13: qty 1

## 2015-09-13 NOTE — ED Notes (Signed)
Per EMS: Pt was sitting on the bed and began to feel dizzy. Pt got to walk to the bathroom and felt like she was going to fall to the floor. Pt has hx of hypertension, stroke 1 year ago, and diabetes. Pt denies hx of vertigo. Pt denies fall.  Pt denies N/V, weakness, SOB, head ache, fever/chills. PT reports dizzy sensation that worsens with movement. Pt reports closing her eyes make sensation better. Pt states: "I feel like I'm drunk, and I don't even drink".

## 2015-09-13 NOTE — ED Provider Notes (Addendum)
[facility] Emergency Department Provider Note  ____________________________________________   I have reviewed the triage vital signs and the nursing notes.   HISTORY  Chief Complaint Dizziness    HPI Kristin Mora is a 60 y.o. female presents today ring of vertigo symptoms. She states she did have vertigo "years and years ago". She is a very poor historian in this regard. He states she was hospitalized somewhere in [place] for it and she does not report diagnosis they gave her. However, she has never been told that she had a stroke until year ago when she had a CVA with left-sided weakness which persists. She has left lower extremity weakness which is at its baseline and some left upper and left facial sensory issues. She states that this is not changed at all today. She states that she was in her normal state of health when she sat up and felt vertiginous symptoms. Family stated that she seemed to be somewhat confused about the day and her age. She states she does not recall being so and she herself has no symptoms of that at this time. She had no fever or chills nausea vomiting or headache. She denies any focal numbness or weakness. The patient states she has very poor IV access and they "always have to use the ultrasound". Reports she suffers from COPD as well as a history of brain aneurysm in the past, diabetes mellitus, and morbid obesity weighing approximately 300 pounds. Patient does state that she has had a craniotomy in the past.  Past Medical History  Diagnosis Date  . Brain aneurysm 04/2007  . Hypertension     controlled with medication  . Diabetes mellitus without complication ([place])     controlled;  . Stroke ([place])   . Asthma   . HLD (hyperlipidemia)     Patient Active Problem List   Diagnosis Date Noted  . Chronic airway obstruction ([place]) 11/04/2014  . Avitaminosis D 11/04/2014  . Adiposity 11/04/2014  . Hypercholesteremia 11/04/2014   . Hemorrhagic cerebrovascular accident (CVA) ([place]) 11/04/2014  . Diabetes mellitus, type 2 ([place]) 10/01/2014  . Essential (primary) hypertension 10/01/2014  . Thalamic hemorrhage ([place]) 10/01/2014    Past Surgical History  Procedure Laterality Date  . Tubal ligation    . Tonsillectomy and adenoidectomy    . Craniotomy  04/2007  . Knee surgery      Current Outpatient Rx  Name  Route  Sig  Dispense  Refill  . amLODipine (NORVASC) 5 MG tablet   Oral   Take 1 tablet (5 mg total) by mouth daily.   30 tablet   5   . atorvastatin (LIPITOR) 40 MG tablet   Oral   Take 40 mg by mouth every evening.         . gabapentin (NEURONTIN) 600 MG tablet   Oral   Take 0.5 tablets (300 mg total) by mouth 2 (two) times daily.   60 tablet   3   . losartan (COZAAR) 50 MG tablet   Oral   Take 50 mg by mouth daily.         . metFORMIN (GLUCOPHAGE) 500 MG tablet      TAKE ONE TABLET BY MOUTH TWICE DAILY   60 tablet   3   . metFORMIN (GLUCOPHAGE) 500 MG tablet   Oral   Take 1 tablet (500 mg total) by mouth 2 (two) times daily with a meal.   60 tablet   2  Allergies Lisinopril and Latex  Family History  Problem Relation Age of Onset  . Heart attack Mother   . Coronary artery disease Mother   . Lung cancer Father   . Diabetes Sister   . Stroke Brother   . Uterine cancer Sister   . Lung cancer Sister   . Bone cancer Brother     Social History Social History  Substance Use Topics  . Smoking status: Former Smoker    Quit date: 11/25/2009  . Smokeless tobacco: None  . Alcohol Use: No    Review of Systems Constitutional: No fever/chills Eyes: No visual changes. ENT: No sore throat. No stiff neck no neck pain Cardiovascular: Denies chest pain. Respiratory: Denies shortness of breath. Gastrointestinal:   no vomiting.  No diarrhea.  No constipation. Genitourinary: Negative for dysuria. Musculoskeletal: Negative lower extremity swelling Skin: Negative for  rash. Neurological: Negative for headaches, focal weakness or numbness aside from baseline. 10-point ROS otherwise negative.  ____________________________________________   PHYSICAL EXAM:  VITAL SIGNS: ED Triage Vitals  Enc Vitals Group     BP 09/13/15 2233 187/87 mmHg     Pulse Rate 09/13/15 2233 73     Resp 09/13/15 2233 19     Temp 09/13/15 2233 98.1 F (36.7 C)     Temp Source 09/13/15 2233 Oral     SpO2 09/13/15 2231 98 %     Weight --      Height --      Head Cir --      Peak Flow --      Pain Score --      Pain Loc --      Pain Edu? --      Excl. in [place]? --     Constitutional: Alert and oriented. Well appearing and in no acute distress. Eyes: Conjunctivae are normal. PERRL. EOMI. Head: Atraumatic. Nose: No congestion/rhinnorhea. Mouth/Throat: Mucous membranes are moist.  Oropharynx non-erythematous. Neck: No stridor.   Nontender with no meningismus Cardiovascular: Normal rate, regular rhythm. Grossly normal heart sounds.  Good peripheral circulation. Respiratory: Normal respiratory effort.  No retractions. Lungs CTAB. Abdominal: Soft and nontender. No distention. No guarding no rebound Back:  There is no focal tenderness or step off there is no midline tenderness there are no lesions noted. there is no CVA tenderness Musculoskeletal: No lower extremity tenderness. No joint effusions, no DVT signs strong distal pulses no edema Neurologic:  Normal speech and language. She does have some left lower extremity weakness versus right but she is 5+ on the right at approximate 4 on the left. Otherwise, exam is reassuring with cranial nerves II through XII grossly intact except for some mild subjective sensory loss in the left face which she states is her baseline, also noted is mild subjective sensory loss on the left arm which she states is her baseline. Finger to nose is within normal limits, there is no significant nystatin as noted, she is alert and oriented has no expressive  or receptive aphasia. There is no dysarthria. Skin:  Skin is warm, dry and intact. No rash noted. Psychiatric: Mood and affect are normal. Speech and behavior are normal.  ____________________________________________   LABS (all labs ordered are listed, but only abnormal results are displayed)  Labs Reviewed  URINALYSIS COMPLETEWITH MICROSCOPIC ([facility] ONLY) - Abnormal; Notable for the following:    Color, Urine YELLOW (*)    APPearance HAZY (*)    Squamous Epithelial / LPF 6-30 (*)    All other components  within normal limits  BASIC METABOLIC PANEL  CBC  CBG MONITORING, ED   ____________________________________________  EKG  I personally interpreted any EKGs ordered by me or triage  ____________________________________________  RADIOLOGY  I reviewed any imaging ordered by me or triage that were performed during my shift and, if possible, patient and/or family made aware of any abnormal findings. ____________________________________________   PROCEDURES  Procedure(s) performed: None  Critical Care performed: None  ____________________________________________   INITIAL IMPRESSION / ASSESSMENT AND PLAN / ED COURSE  Pertinent labs & imaging results that were available during my care of the patient were reviewed by me and considered in my medical decision making (see chart for details).  Patient with what sounds a history of vertigo presents with true vertigo symptoms which is to say she has a spinning sensation when she moves around. She has some focality to her exam but this is her baseline according to her and her family. Patient does have a history she states of brain aneurysm with a craniotomy in the past. This on its own I think would make her less likely candidate for TPA, the fact that her neurologic exam is at her baseline also makes her not a candidate for TPA. Patient states at this time she'll he really has symptoms when she moves which again is not consistent  usually with a CVA however certainly could be given her history we will obtain a CT scan. We're having great difficulty finding an IV as the patient is morbidly obese, we are attempting and also get an IV at this time  ----------------------------------------- 12:52 AM on 09/14/2015 -----------------------------------------  Patient's CT is negative. The patient states she still feels "drunk". But otherwise has no other complaints. We have given her Antivert only recently and we are awaiting to see if that helps her symptoms.  ----------------------------------------- 2:21 AM on 09/14/2015 -----------------------------------------  Patient remains at her neurologic baseline, however she complains still of vertigo despite Valium and Antivert. We will admit her to the hospital for further evaluation as she is not safe to ambulate. ____________________________________________   FINAL CLINICAL IMPRESSION(S) / ED DIAGNOSES  Final diagnoses:  None      This chart was dictated using voice recognition software.  Despite best efforts to proofread,  errors can occur which can change meaning.    Schuyler Amor, MD 09/13/15 [id number]  Schuyler Amor, MD 09/14/15 [id number]  Schuyler Amor, MD 09/14/15 [phone]

## 2015-09-14 ENCOUNTER — Encounter: Payer: Self-pay | Admitting: Internal Medicine

## 2015-09-14 DIAGNOSIS — R42 Dizziness and giddiness: Secondary | ICD-10-CM

## 2015-09-14 LAB — CBC
HCT: 43.5 % (ref 35.0–47.0)
HEMOGLOBIN: 14.4 g/dL (ref 12.0–16.0)
MCH: 28.6 pg (ref 26.0–34.0)
MCHC: 33 g/dL (ref 32.0–36.0)
MCV: 86.6 fL (ref 80.0–100.0)
Platelets: 250 10*3/uL (ref 150–440)
RBC: 5.02 MIL/uL (ref 3.80–5.20)
RDW: 15.9 % — ABNORMAL HIGH (ref 11.5–14.5)
WBC: 10.4 10*3/uL (ref 3.6–11.0)

## 2015-09-14 LAB — BASIC METABOLIC PANEL
ANION GAP: 4 — AB (ref 5–15)
BUN: 16 mg/dL (ref 6–20)
CALCIUM: 8.6 mg/dL — AB (ref 8.9–10.3)
CO2: 29 mmol/L (ref 22–32)
Chloride: 104 mmol/L (ref 101–111)
Creatinine, Ser: 0.75 mg/dL (ref 0.44–1.00)
Glucose, Bld: 110 mg/dL — ABNORMAL HIGH (ref 65–99)
Potassium: 3.5 mmol/L (ref 3.5–5.1)
Sodium: 137 mmol/L (ref 135–145)

## 2015-09-14 LAB — PROTIME-INR
INR: 1.03
PROTHROMBIN TIME: 13.7 s (ref 11.4–15.0)

## 2015-09-14 LAB — TROPONIN I
Troponin I: <0.03 ng/mL (ref <0.031)
Troponin I: <0.03 ng/mL (ref <0.031)

## 2015-09-14 LAB — GLUCOSE, CAPILLARY: GLUCOSE-CAPILLARY: 99 mg/dL (ref 65–99)

## 2015-09-14 MED ORDER — GABAPENTIN 600 MG PO TABS
300.0000 mg | ORAL_TABLET | Freq: Two times a day (BID) | ORAL | Status: DC
  Administered 2015-09-14 – 2015-09-17 (×7): 300 mg via ORAL
  Filled 2015-09-14: qty 2
  Filled 2015-09-14 (×5): qty 1
  Filled 2015-09-14: qty 2

## 2015-09-14 MED ORDER — METFORMIN HCL 500 MG PO TABS
500.0000 mg | ORAL_TABLET | Freq: Two times a day (BID) | ORAL | Status: DC
  Administered 2015-09-14 – 2015-09-17 (×7): 500 mg via ORAL
  Filled 2015-09-14 (×7): qty 1

## 2015-09-14 MED ORDER — SODIUM CHLORIDE 0.9% FLUSH
3.0000 mL | Freq: Two times a day (BID) | INTRAVENOUS | Status: DC
  Administered 2015-09-14 – 2015-09-17 (×7): 3 mL via INTRAVENOUS

## 2015-09-14 MED ORDER — DIAZEPAM 5 MG/ML IJ SOLN
5.0000 mg | Freq: Once | INTRAMUSCULAR | Status: AC
  Administered 2015-09-14: 5 mg via INTRAVENOUS
  Filled 2015-09-14: qty 2

## 2015-09-14 MED ORDER — ENOXAPARIN SODIUM 40 MG/0.4ML ~~LOC~~ SOLN
40.0000 mg | Freq: Two times a day (BID) | SUBCUTANEOUS | Status: DC
  Administered 2015-09-14 – 2015-09-17 (×7): 40 mg via SUBCUTANEOUS
  Filled 2015-09-14 (×7): qty 0.4

## 2015-09-14 MED ORDER — BISACODYL 5 MG PO TBEC: 5.0000 mg | DELAYED_RELEASE_TABLET | Freq: Every day | ORAL | Status: DC | PRN

## 2015-09-14 MED ORDER — ONDANSETRON HCL 4 MG PO TABS: 4.0000 mg | ORAL_TABLET | Freq: Four times a day (QID) | ORAL | Status: DC | PRN

## 2015-09-14 MED ORDER — SODIUM CHLORIDE 0.9 % IV BOLUS (SEPSIS)
1000.0000 mL | Freq: Once | INTRAVENOUS | Status: AC
  Administered 2015-09-14: 1000 mL via INTRAVENOUS

## 2015-09-14 MED ORDER — ACETAMINOPHEN 650 MG RE SUPP: 650.0000 mg | Freq: Four times a day (QID) | RECTAL | Status: DC | PRN

## 2015-09-14 MED ORDER — ACETAMINOPHEN 325 MG PO TABS: 650.0000 mg | ORAL_TABLET | Freq: Four times a day (QID) | ORAL | Status: DC | PRN

## 2015-09-14 MED ORDER — ATORVASTATIN CALCIUM 20 MG PO TABS
40.0000 mg | ORAL_TABLET | Freq: Every evening | ORAL | Status: DC
  Administered 2015-09-14 – 2015-09-16 (×3): 40 mg via ORAL
  Filled 2015-09-14 (×3): qty 2

## 2015-09-14 MED ORDER — SODIUM CHLORIDE 0.9% FLUSH
3.0000 mL | INTRAVENOUS | Status: DC | PRN
  Administered 2015-09-16: 3 mL via INTRAVENOUS
  Filled 2015-09-14: qty 3

## 2015-09-14 MED ORDER — SODIUM CHLORIDE 0.9 % IV SOLN: 250.0000 mL | INTRAVENOUS | Status: DC | PRN

## 2015-09-14 MED ORDER — LOSARTAN POTASSIUM 50 MG PO TABS
50.0000 mg | ORAL_TABLET | Freq: Every day | ORAL | Status: DC
  Administered 2015-09-14 – 2015-09-17 (×4): 50 mg via ORAL
  Filled 2015-09-14 (×4): qty 1

## 2015-09-14 MED ORDER — LORAZEPAM 2 MG/ML IJ SOLN: 2.0000 mg | Freq: Once | INTRAMUSCULAR | Status: DC

## 2015-09-14 MED ORDER — AMLODIPINE BESYLATE 5 MG PO TABS
5.0000 mg | ORAL_TABLET | Freq: Every day | ORAL | Status: DC
  Administered 2015-09-14: 5 mg via ORAL
  Filled 2015-09-14: qty 1

## 2015-09-14 MED ORDER — ONDANSETRON HCL 4 MG/2ML IJ SOLN: 4.0000 mg | Freq: Four times a day (QID) | INTRAMUSCULAR | Status: DC | PRN

## 2015-09-14 NOTE — ED Notes (Signed)
Pt's family would like to be called for any significant change in pt status. Danna Hefty - daughter [phone].

## 2015-09-14 NOTE — ED Notes (Signed)
Called floor to let them know pt on the way up 

## 2015-09-14 NOTE — ED Notes (Signed)
Pt reports symptoms are not resolving. MD notified

## 2015-09-14 NOTE — ED Notes (Signed)
Pt reports decreased dizziness. Pt states: "Its still there, but it's a little better"

## 2015-09-14 NOTE — H&P (Signed)
[facility] [place] NAME: Kristin Mora    MR#:  [id number]  DATE OF BIRTH:  01-Sep-1955  DATE OF ADMISSION:  09/13/2015  PRIMARY CARE PHYSICIAN: Carmon Ginsberg, PA   REQUESTING/REFERRING PHYSICIAN:   CHIEF COMPLAINT:   Chief Complaint  Patient presents with  . Dizziness    HISTORY OF PRESENT ILLNESS: Kristin Mora  is a 60 y.o. female with a known history of brain aneurysm, hypertension, diabetes mellitus2, CVA, hyperlipidemia presented to the emergency room with dizziness. patient took a nap yesterday evening and after waking up she is feeling dizzy. She feels as if she is going to lose balance and fall. She was holding her grandbaby and was about to fall yesterday. No history of head injury. No history of any headache or any blurry vision. Patient was given oral meclizine in the emergency room. Patient unable to get up and move around because of the dizziness. No history of chest pain. No complaints of shortness of breath. No fever or chills or cough.  PAST MEDICAL HISTORY:   Past Medical History  Diagnosis Date  . Brain aneurysm 04/2007  . Hypertension     controlled with medication  . Diabetes mellitus without complication ([place])     controlled;  . Stroke ([place])   . Asthma   . HLD (hyperlipidemia)     PAST SURGICAL HISTORY: Past Surgical History  Procedure Laterality Date  . Tubal ligation    . Tonsillectomy and adenoidectomy    . Craniotomy  04/2007  . Knee surgery      SOCIAL HISTORY:  Social History  Substance Use Topics  . Smoking status: Former Smoker    Quit date: 11/25/2009  . Smokeless tobacco: Not on file  . Alcohol Use: No    FAMILY HISTORY:  Family History  Problem Relation Age of Onset  . Heart attack Mother   . Coronary artery disease Mother   . Lung cancer Father   . Diabetes Sister   . Stroke Brother   . Uterine cancer Sister   . Lung cancer Sister   . Bone cancer Brother     DRUG  ALLERGIES:  Allergies  Allergen Reactions  . Lisinopril Swelling  . Latex Rash    REVIEW OF SYSTEMS:   CONSTITUTIONAL: No fever, fatigue or weakness.  EYES: No blurred or double vision.  EARS, NOSE, AND THROAT: No tinnitus or ear pain.  RESPIRATORY: No cough, shortness of breath, wheezing or hemoptysis.  CARDIOVASCULAR: No chest pain, orthopnea, edema.  GASTROINTESTINAL: No nausea, vomiting, diarrhea or abdominal pain.  GENITOURINARY: No dysuria, hematuria.  ENDOCRINE: No polyuria, nocturia,  HEMATOLOGY: No anemia, easy bruising or bleeding SKIN: No rash or lesion. MUSCULOSKELETAL: No joint pain or arthritis.   NEUROLOGIC: No tingling, numbness, weakness. Has dizziness PSYCHIATRY: No anxiety or depression.   MEDICATIONS AT HOME:  Prior to Admission medications   Medication Sig Start Date End Date Taking? Authorizing Provider  amLODipine (NORVASC) 5 MG tablet Take 1 tablet (5 mg total) by mouth daily. 05/29/15  Yes Carmon Ginsberg, PA  atorvastatin (LIPITOR) 40 MG tablet Take 40 mg by mouth every evening.   Yes Historical Provider, MD  losartan (COZAAR) 50 MG tablet Take 50 mg by mouth daily.   Yes Historical Provider, MD  metFORMIN (GLUCOPHAGE) 500 MG tablet Take 1 tablet (500 mg total) by mouth 2 (two) times daily with a meal. 08/14/15  Yes Carmon Ginsberg, PA  gabapentin (NEURONTIN) 600 MG tablet  Take 0.5 tablets (300 mg total) by mouth 2 (two) times daily. 05/27/15   Fritzi Mandes, MD      PHYSICAL EXAMINATION:   VITAL SIGNS: Blood pressure 155/83, pulse 70, temperature 98.1 F (36.7 C), temperature source Oral, resp. rate 19, SpO2 97 %.  GENERAL:  60 y.o.-year-old obese patient lying in the bed with no acute distress.  EYES: Pupils equal, round, reactive to light and accommodation. No scleral icterus. Extraocular muscles intact.  HEENT: Head atraumatic, normocephalic. Oropharynx and nasopharynx clear.  NECK:  Supple, no jugular venous distention. No thyroid enlargement, no  tenderness.  LUNGS: Normal breath sounds bilaterally, no wheezing, rales,rhonchi or crepitation. No use of accessory muscles of respiration.  CARDIOVASCULAR: S1, S2 normal. No murmurs, rubs, or gallops.  ABDOMEN: Soft, nontender, nondistended. Bowel sounds present. No organomegaly or mass.  EXTREMITIES: No pedal edema, cyanosis, or clubbing.  NEUROLOGIC: Cranial nerves II through XII are intact. Muscle strength 5/5 in all extremities. Sensation intact. Gait not checked. No cerebellar signs noted. PSYCHIATRIC: The patient is alert and oriented x 3.  SKIN: No obvious rash, lesion, or ulcer.   LABORATORY PANEL:   CBC  Recent Labs Lab 09/13/15 2302  WBC 10.4  HGB 14.4  HCT 43.5  PLT 250  MCV 86.6  MCH 28.6  MCHC 33.0  RDW 15.9*   ------------------------------------------------------------------------------------------------------------------  Chemistries   Recent Labs Lab 09/13/15 2302  NA 137  K 3.5  CL 104  CO2 29  GLUCOSE 110*  BUN 16  CREATININE 0.75  CALCIUM 8.6*   ------------------------------------------------------------------------------------------------------------------ CrCl cannot be calculated (Unknown ideal weight.). ------------------------------------------------------------------------------------------------------------------ No results for input(s): TSH, T4TOTAL, T3FREE, THYROIDAB in the last 72 hours.  Invalid input(s): FREET3   Coagulation profile  Recent Labs Lab 09/14/15 0021  INR 1.03   ------------------------------------------------------------------------------------------------------------------- No results for input(s): DDIMER in the last 72 hours. -------------------------------------------------------------------------------------------------------------------  Cardiac Enzymes No results for input(s): CKMB, TROPONINI, MYOGLOBIN in the last 168 hours.  Invalid input(s):  CK ------------------------------------------------------------------------------------------------------------------ Invalid input(s): POCBNP  ---------------------------------------------------------------------------------------------------------------  Urinalysis    Component Value Date/Time   COLORURINE YELLOW* 09/13/2015 2302   COLORURINE Yellow 10/01/2014 0757   APPEARANCEUR HAZY* 09/13/2015 2302   APPEARANCEUR Clear 10/01/2014 0757   LABSPEC 1.012 09/13/2015 2302   LABSPEC 1.017 10/01/2014 0757   PHURINE 8.0 09/13/2015 2302   PHURINE 7.0 10/01/2014 0757   GLUCOSEU NEGATIVE 09/13/2015 2302   GLUCOSEU Negative 10/01/2014 0757   HGBUR NEGATIVE 09/13/2015 2302   HGBUR Negative 10/01/2014 0757   BILIRUBINUR NEGATIVE 09/13/2015 2302   BILIRUBINUR Negative 10/01/2014 0757   KETONESUR NEGATIVE 09/13/2015 2302   KETONESUR Negative 10/01/2014 0757   PROTEINUR NEGATIVE 09/13/2015 2302   PROTEINUR Negative 10/01/2014 0757   NITRITE NEGATIVE 09/13/2015 2302   NITRITE Negative 10/01/2014 0757   LEUKOCYTESUR NEGATIVE 09/13/2015 2302   LEUKOCYTESUR Negative 10/01/2014 0757     RADIOLOGY: Ct Head Wo Contrast  09/14/2015  CLINICAL DATA:  Lightheadedness tonight. EXAM: CT HEAD WITHOUT CONTRAST TECHNIQUE: Contiguous axial images were obtained from the base of the skull through the vertex without intravenous contrast. COMPARISON:  05/25/2015 FINDINGS: Prior right temporal craniotomy and circle of Willis aneurysm clip. There is no intracranial hemorrhage. There is no extra-axial fluid collection. Brain volume is normal for age. There is slight white matter hypodensity which appears chronic and likely related to small vessel disease. Gray matter and white matter are otherwise unremarkable with normal differentiation. Normal size and configuration of the CSF spaces. No interval change from 05/25/2015. Visible paranasal sinuses are  clear. IMPRESSION: No acute intracranial findings. Normal brain,  with mild stable postoperative findings. Circle of Willis aneurysm clip. Electronically Signed   By: Andreas Newport M.D.   On: 09/14/2015 00:36    EKG: Orders placed or performed during the hospital encounter of 09/13/15  . EKG 12-Lead  . EKG 12-Lead  . ED EKG  . ED EKG  . ED EKG  . ED EKG    IMPRESSION AND PLAN: 60 year old obese female patient with history of diabetes mellitus, hypertension, brain aneurysm, hyperlipidemia presented to the emergency room with dizziness and giddiness. Admitting diagnosis 1. Vertigo 2.Dizziness 3.Hypertension 4.DM typle II 5.Hyperlipidemia Treatment Plan : Start oral meclizine Resume metformin for DM Cycle troponin to check for Ischemia DVT prophylaxis with [place] lovenox  All the records are reviewed and case discussed with ED provider. Management plans discussed with the patient, family and they are in agreement.  CODE STATUS:FULL Code Status History    Date Active Date Inactive Code Status Order ID Comments User Context   05/25/2015 11:28 PM 05/27/2015  8:51 PM Full Code [id number]  Lance Coon, MD Inpatient       TOTAL TIME TAKING CARE OF THIS PATIENT: 42 minutes.    Saundra Shelling M.D on 09/14/2015 at 3:25 AM  Between 7am to 6pm - Pager - [phone]  After 6pm go to [email or web address] - password [email or web address] [facility]  [facility]  Office  [phone]  CC: Primary care physician; Carmon Ginsberg, PA

## 2015-09-14 NOTE — Progress Notes (Signed)
Spoke with Dr. Melynda Ripple ok to d/c telemetry

## 2015-09-14 NOTE — Progress Notes (Signed)
Anticoagulation monitoring(Lovenox):  60 yo  ordered Lovenox 40 mg Q24h  Filed Weights   09/14/15 0435  Weight: 291 lb 14.4 oz (132.405 kg)   BMI 53.4  Lab Results  Component Value Date   CREATININE 0.75 09/13/2015   CREATININE 0.85 05/26/2015   CREATININE 0.66 05/25/2015   Estimated Creatinine Clearance: 96.8 mL/min (by C-G formula based on Cr of 0.75). Hemoglobin & Hematocrit     Component Value Date/Time   HGB 14.4 09/13/2015 2302   HGB 14.6 10/01/2014 0757   HCT 43.5 09/13/2015 2302   HCT 45.6 10/01/2014 0757     Per Protocol for Patient with estCrcl< 30 ml/min and BMI < 40, will transition to Lovenox 40 mg Q12h.

## 2015-09-14 NOTE — Care Management Note (Signed)
Case Management Note  Patient Details  Name: Kristin Mora MRN: [id number] Date of Birth: 1956/01/01  Subjective/Objective:                  Met with patient to discuss discharge planning. She lives with her daughter [name] and a grandson and he is the only one working in the household. She states that they make <$2500/month. Her other daughter Marliss Coots works at [employer] and has been helping patient obtain medications. Her PCP is with Dr. Lelon Huh. She has a cane but does not normally need it for ambulation as she states she has been independent. O2 is new.   Action/Plan: Applications to Medication [facility] and [facility] shared with patient. I have made referral to [facility] with patient permission and her contact as [phone]. I have faxed Rx to [facility] for review. RNCM will continue to follow.   Expected Discharge Date:                  Expected Discharge Plan:     In-House Referral:     Discharge planning Services  CM Consult, [facility], Medication Assistance  Post Acute Care Choice:    Choice offered to:  Patient  DME Arranged:    DME Agency:     HH Arranged:    [place] Agency:     Status of Service:  In process, will continue to follow  Medicare Important Message Given:    Date Medicare IM Given:    Medicare IM give by:    Date Additional Medicare IM Given:    Additional Medicare Important Message give by:     If discussed at [place] of Stay Meetings, dates discussed:    Additional Comments:  Marshell Garfinkel, RN 09/14/2015, 9:17 AM

## 2015-09-15 ENCOUNTER — Encounter: Payer: Self-pay | Admitting: Radiology

## 2015-09-15 ENCOUNTER — Observation Stay: Payer: Self-pay

## 2015-09-15 DIAGNOSIS — R42 Dizziness and giddiness: Secondary | ICD-10-CM

## 2015-09-15 LAB — GLUCOSE, CAPILLARY
GLUCOSE-CAPILLARY: 102 mg/dL — AB (ref 65–99)
GLUCOSE-CAPILLARY: 122 mg/dL — AB (ref 65–99)
Glucose-Capillary: 106 mg/dL — ABNORMAL HIGH (ref 65–99)
Glucose-Capillary: 105 mg/dL — ABNORMAL HIGH (ref 65–99)

## 2015-09-15 LAB — HEMOGLOBIN A1C: HEMOGLOBIN A1C: 6.3 % — AB (ref 4.0–6.0)

## 2015-09-15 MED ORDER — INSULIN ASPART 100 UNIT/ML ~~LOC~~ SOLN
0.0000 [IU] | Freq: Three times a day (TID) | SUBCUTANEOUS | Status: DC
  Administered 2015-09-15 – 2015-09-17 (×3): 1 [IU] via SUBCUTANEOUS
  Filled 2015-09-15 (×3): qty 1

## 2015-09-15 MED ORDER — ASPIRIN 81 MG PO CHEW
81.0000 mg | CHEWABLE_TABLET | Freq: Every day | ORAL | Status: DC
Start: 2015-09-15 — End: 2015-09-16
  Administered 2015-09-15: 81 mg via ORAL
  Filled 2015-09-15 (×2): qty 1

## 2015-09-15 MED ORDER — IOPAMIDOL (ISOVUE-370) INJECTION 76%
80.0000 mL | Freq: Once | INTRAVENOUS | Status: AC | PRN
  Administered 2015-09-15: 80 mL via INTRAVENOUS

## 2015-09-15 MED ORDER — DIAZEPAM 5 MG/ML IJ SOLN
7.5000 mg | Freq: Three times a day (TID) | INTRAMUSCULAR | Status: DC | PRN
  Administered 2015-09-16: 7.5 mg via INTRAVENOUS
  Filled 2015-09-15: qty 2

## 2015-09-15 NOTE — Evaluation (Signed)
Physical Therapy Evaluation Patient Details Name: Kristin Mora MRN: [id number] DOB: [birth year] Today's Date: 09/15/2015   History of Present Illness  60 yo F presented to ED for increased dizziness and feeling as though she was going to fall when getting up. She is suspected to have vertigo and also ruling in/out possibility of a stroke. CT scans were negative for acute infarct and MRI is not able to be performed due to prior history of brain aneurysm being clipped. PMH includes brain aneurysm, HTN, DM, stroke and craniotomy.   Clinical Impression  Pt demonstrates generalized weakness and difficulty walking with primary c/o dizziness and feeling weak. PT performed extensive testing focusing on vertigo like symptoms. At rest lying supine pt has very little is any dizziness. Upon sitting at EOB she describes her dizziness to be 3/10. Visual accuity presents intact when following finger. B finger toe nose and fine motor finger tip touching are equal and intact. Performed BPPV tests including sidelying test with dizziness lying on R/looking L worse than lying on L/looking R. Also performed [facility] test with head turned to the L, with dizziness reported by pt as 7/10. No significant nystagmus noted, with observation of eyes difficult as pt had difficulty keeping eyes open during testing. Symptoms will continued to be monitored during future therapy session. Pt will benefit from skilled PT services to increase functional I and mobility for safe discharge. HHPT recommended to continue to progress towards PLOF and reduce risk of falls. FWW recommended as pt demonstrates balance deficits at this time.     Follow Up Recommendations Home health PT;Supervision for mobility/OOB    Equipment Recommendations  Rolling walker with 5" wheels    Recommendations for Other Services       Precautions / Restrictions Precautions Precautions: Fall Restrictions Weight Bearing Restrictions: No       Mobility  Bed Mobility Overal bed mobility: Needs Assistance Bed Mobility: Supine to Sit;Sit to Supine     Supine to sit: Mod assist;HOB elevated Sit to supine: Mod assist;HOB elevated   General bed mobility comments: uses rail, difficulty getting trunk upright  Transfers Overall transfer level: Needs assistance Equipment used: Rolling walker (2 wheeled) Transfers: Sit to/from Stand Sit to Stand: Mod assist         General transfer comment: cues for hand placement and anterior weight shifting  Ambulation/Gait Ambulation/Gait assistance: Min assist Ambulation Distance (Feet): 5 Feet Assistive device: Rolling walker (2 wheeled)       General Gait Details: Pt side stepped 5 ft. Unsteady with no complete LOB. Cues for safe technique.  Stairs            Wheelchair Mobility    Modified Rankin (Stroke Patients Only)       Balance Overall balance assessment: Needs assistance Sitting-balance support: Bilateral upper extremity supported;Feet supported Sitting balance-Leahy Scale: Poor Sitting balance - Comments: difficulty maintaining balance due to dizziness.  Postural control: Posterior lean Standing balance support: Bilateral upper extremity supported Standing balance-Leahy Scale: Fair Standing balance comment: maintains, but unsteady                             Pertinent Vitals/Pain Pain Assessment: No/denies pain    Home Living Family/patient expects to be discharged to:: Private residence Living Arrangements: Children Available Help at Discharge: Family Type of Home: Apartment Home Access: Stairs to enter Entrance Stairs-Rails: Psychiatric nurse of Steps: 3 Home Layout: One level Home Equipment: Kasandra Knudsen -  single point      Prior Function Level of Independence: Independent               Hand Dominance   Dominant Hand: Left    Extremity/Trunk Assessment   Upper Extremity Assessment: Generalized weakness            Lower Extremity Assessment: Generalized weakness         Communication   Communication: No difficulties  Cognition Arousal/Alertness: Awake/alert Behavior During Therapy: WFL for tasks assessed/performed Overall Cognitive Status: Within Functional Limits for tasks assessed                      General Comments      Exercises Other Exercises Other Exercises: Pt sits at EOB x10 minutes with focus on postural correction to improve seated balance and safety. With cues and min A pt able to correct and shift weight anteriorly. She is limited by dizziness and closes eyes often. After sitting a few minutes she is able to maintain with better control of trunk. Other Exercises: Extensive pt education on possible causes of dizziness and how this increases her risk of falls. Discussed importance of exercise to increase strength, endurance and balance to be able to ambulate with reduce risk of falling.       Assessment/Plan    PT Assessment Patient needs continued PT services  PT Diagnosis Difficulty walking;Generalized weakness   PT Problem List Decreased strength;Decreased activity tolerance;Decreased balance;Decreased mobility;Decreased safety awareness  PT Treatment Interventions Gait training;Stair training;Therapeutic activities;Therapeutic exercise;Balance training;Neuromuscular re-education;Patient/family education   PT Goals (Current goals can be found in the Care Plan section) Acute Rehab PT Goals Patient Stated Goal: to get home to see my grand baby PT Goal Formulation: With patient Time For Goal Achievement: 09/29/15 Potential to Achieve Goals: Fair    Frequency Min 2X/week   Barriers to discharge Inaccessible home environment;Decreased caregiver support steps to enter    Co-evaluation               End of Session Equipment Utilized During Treatment: Gait belt Activity Tolerance: Patient limited by fatigue;Other (comment) (limited by  dizziness) Patient left: in bed;with call bell/phone within reach;with bed alarm set Nurse Communication: Mobility status         Time: 1500-1540 PT Time Calculation (min) (ACUTE ONLY): 40 min   Charges:   PT Evaluation $PT Eval High Complexity: 1 Procedure PT Treatments $Therapeutic Activity: 23-37 mins   PT G Codes:        Neoma Laming, PT, DPT  09/15/2015, 4:52 PM [phone]

## 2015-09-15 NOTE — Progress Notes (Signed)
Kristin Mora NAME: Kristin Mora    MR#:  [id number]  DATE OF BIRTH:  1956/06/09  SUBJECTIVE:  CHIEF COMPLAINT:   Chief Complaint  Patient presents with  . Dizziness   Strong history and risk factors for stroke, came with acute onset severe dizziness.  REVIEW OF SYSTEMS:  CONSTITUTIONAL: No fever, fatigue or weakness.  EYES: No blurred or double vision.  EARS, NOSE, AND THROAT: No tinnitus or ear pain.  RESPIRATORY: No cough, shortness of breath, wheezing or hemoptysis.  CARDIOVASCULAR: No chest pain, orthopnea, edema.  GASTROINTESTINAL: No nausea, vomiting, diarrhea or abdominal pain.  GENITOURINARY: No dysuria, hematuria.  ENDOCRINE: No polyuria, nocturia,  HEMATOLOGY: No anemia, easy bruising or bleeding SKIN: No rash or lesion. MUSCULOSKELETAL: No joint pain or arthritis.   NEUROLOGIC: No tingling, numbness, weakness. Have severe dizziness. PSYCHIATRY: No anxiety or depression.   ROS  DRUG ALLERGIES:   Allergies  Allergen Reactions  . Lisinopril Swelling  . Latex Rash    VITALS:  Blood pressure 91/35, pulse 63, temperature 98.2 F (36.8 C), temperature source Oral, resp. rate 18, height 5\' 2"  (1.575 m), weight 135.626 kg (299 lb), SpO2 96 %.  PHYSICAL EXAMINATION:  GENERAL:  60 y.o.-year-old patient lying in the bed with no acute distress.  EYES: Pupils equal, round, reactive to light and accommodation. No scleral icterus. Extraocular muscles intact.  HEENT: Head atraumatic, normocephalic. Oropharynx and nasopharynx clear.  NECK:  Supple, no jugular venous distention. No thyroid enlargement, no tenderness.  LUNGS: Normal breath sounds bilaterally, no wheezing, rales,rhonchi or crepitation. No use of accessory muscles of respiration.  CARDIOVASCULAR: S1, S2 normal. No murmurs, rubs, or gallops.  ABDOMEN: Soft, nontender, nondistended. Bowel sounds present. No organomegaly or mass.  EXTREMITIES: No pedal  edema, cyanosis, or clubbing.  NEUROLOGIC: Cranial nerves II through XII are intact. Muscle strength 5/5 in all extremities. Sensation intact. Gait not checked. Not able to move her head or sit up, as it causes severe dizziness. PSYCHIATRIC: The patient is alert and oriented x 3.  SKIN: No obvious rash, lesion, or ulcer.   Physical Exam LABORATORY PANEL:   CBC  Recent Labs Lab 09/13/15 2302  WBC 10.4  HGB 14.4  HCT 43.5  PLT 250   ------------------------------------------------------------------------------------------------------------------  Chemistries   Recent Labs Lab 09/13/15 2302  NA 137  K 3.5  CL 104  CO2 29  GLUCOSE 110*  BUN 16  CREATININE 0.75  CALCIUM 8.6*   ------------------------------------------------------------------------------------------------------------------  Cardiac Enzymes  Recent Labs Lab 09/14/15 1045 09/14/15 1629  TROPONINI <0.03 <0.03   ------------------------------------------------------------------------------------------------------------------  RADIOLOGY:  Ct Head Wo Contrast  09/14/2015  CLINICAL DATA:  Lightheadedness tonight. EXAM: CT HEAD WITHOUT CONTRAST TECHNIQUE: Contiguous axial images were obtained from the base of the skull through the vertex without intravenous contrast. COMPARISON:  05/25/2015 FINDINGS: Prior right temporal craniotomy and circle of Willis aneurysm clip. There is no intracranial hemorrhage. There is no extra-axial fluid collection. Brain volume is normal for age. There is slight white matter hypodensity which appears chronic and likely related to small vessel disease. Gray matter and white matter are otherwise unremarkable with normal differentiation. Normal size and configuration of the CSF spaces. No interval change from 05/25/2015. Visible paranasal sinuses are clear. IMPRESSION: No acute intracranial findings. Normal brain, with mild stable postoperative findings. Circle of Willis aneurysm clip.  Electronically Signed   By: Andreas Newport M.D.   On: 09/14/2015 00:36    ASSESSMENT AND  PLAN:   Principal Problem:   Vertigo  * Vertigo/ dizziness   May be a cerebellar stroke   Will get MRI,after radiologist confirms compatibility of her coil in brain in aneurysmal site.   Also get neurology and PT consult.   She is not on aspirin- i will start it.   On statin.  * Diabetes   Metformin   Will keep on Insulin scc.  * hypertension   amlodipine and losartan.  * hyperlipidemia   Cont statin.  All the records are reviewed and case discussed with Care Management/Social Workerr. Management plans discussed with the patient, family and they are in agreement.  CODE STATUS: Full.  TOTAL TIME TAKING CARE OF THIS PATIENT: 35 minutes.    POSSIBLE D/C IN 1-2 DAYS, DEPENDING ON CLINICAL CONDITION.   Vaughan Basta M.D on 09/15/2015   Between 7am to 6pm - Pager - [phone]  After 6pm go to [email or web address] - password [email or web address] [facility]  Office  [phone]  CC: Primary care physician; Carmon Ginsberg, PA  Note: This dictation was prepared with Dragon dictation along with smaller phrase technology. Any transcriptional errors that result from this process are unintentional.

## 2015-09-15 NOTE — Plan of Care (Signed)
Problem: Safety: Goal: Ability to remain free from injury will improve Outcome: Progressing Free from falls this shift.  Problem: Pain Managment: Goal: General experience of comfort will improve Outcome: Completed/Met Date Met:  09/15/15 No complaints of pain this shift.  Problem: Fluid Volume: Goal: Ability to maintain a balanced intake and output will improve Outcome: Progressing Eating and drinking without difficulty.

## 2015-09-15 NOTE — Progress Notes (Signed)
Placed patient on overnight pulse OX, patient alarms are 100%-85%for spO2. HR 170-40

## 2015-09-15 NOTE — Consult Note (Addendum)
Referring Physician: Anselm Jungling    Chief Complaint: Dizziness  HPI: Kristin Mora is an 60 y.o. female with a history of aneurysm repair and hemorrhagic thalamic infarct who presents with acute onset vertigo.  Patient reports no numbness or weakness.  Feels she is falling to the right and has needed to hold onto things to walk.  Initial NIHSS of 0.   Date last known well: 09/13/2015 Time last known well: Time: 20:00 tPA Given: No: Minimal symptoms  MRankin: 0  Past Medical History  Diagnosis Date  . Brain aneurysm 04/2007  . Hypertension     controlled with medication  . Diabetes mellitus without complication ([place])     controlled;  . Stroke ([place])   . Asthma   . HLD (hyperlipidemia)     Past Surgical History  Procedure Laterality Date  . Tubal ligation    . Tonsillectomy and adenoidectomy    . Craniotomy  04/2007  . Knee surgery      Family History  Problem Relation Age of Onset  . Heart attack Mother   . Coronary artery disease Mother   . Lung cancer Father   . Diabetes Sister   . Stroke Brother   . Uterine cancer Sister   . Lung cancer Sister   . Bone cancer Brother    Social History:  reports that she quit smoking about 5 years ago. She does not have any smokeless tobacco history on file. She reports that she does not drink alcohol or use illicit drugs.  Allergies:  Allergies  Allergen Reactions  . Lisinopril Swelling  . Latex Rash    Medications:  I have reviewed the patient's current medications. Prior to Admission:  Prescriptions prior to admission  Medication Sig Dispense Refill Last Dose  . amLODipine (NORVASC) 5 MG tablet Take 1 tablet (5 mg total) by mouth daily. 30 tablet 5 09/14/2015 at Unknown time  . atorvastatin (LIPITOR) 40 MG tablet Take 40 mg by mouth every evening.   Taking  . losartan (COZAAR) 50 MG tablet Take 50 mg by mouth daily.   Taking  . metFORMIN (GLUCOPHAGE) 500 MG tablet Take 1 tablet (500 mg total) by mouth 2 (two) times daily  with a meal. 60 tablet 2 09/14/2015 at Unknown time  . gabapentin (NEURONTIN) 600 MG tablet Take 0.5 tablets (300 mg total) by mouth 2 (two) times daily. 60 tablet 3 Taking   Scheduled: . aspirin  81 mg Oral Daily  . atorvastatin  40 mg Oral QPM  . enoxaparin (LOVENOX) injection  40 mg Subcutaneous Q12H  . gabapentin  300 mg Oral BID  . insulin aspart  0-9 Units Subcutaneous TID WC  . LORazepam  2 mg Intravenous Once  . losartan  50 mg Oral Daily  . metFORMIN  500 mg Oral BID WC  . sodium chloride flush  3 mL Intravenous Q12H    ROS: History obtained from the patient  General ROS: negative for - chills, fatigue, fever, night sweats, weight gain or weight loss Psychological ROS: negative for - behavioral disorder, hallucinations, memory difficulties, mood swings or suicidal ideation Ophthalmic ROS: negative for - blurry vision, double vision, eye pain or loss of vision ENT ROS: negative for - epistaxis, nasal discharge, oral lesions, sore throat, tinnitus or vertigo Allergy and Immunology ROS: negative for - hives or itchy/watery eyes Hematological and Lymphatic ROS: negative for - bleeding problems, bruising or swollen lymph nodes Endocrine ROS: negative for - galactorrhea, hair pattern changes, polydipsia/polyuria or temperature  intolerance Respiratory ROS: negative for - cough, hemoptysis, shortness of breath or wheezing Cardiovascular ROS: negative for - chest pain, dyspnea on exertion, edema or irregular heartbeat Gastrointestinal ROS: negative for - abdominal pain, diarrhea, hematemesis, nausea/vomiting or stool incontinence Genito-Urinary ROS: negative for - dysuria, hematuria, incontinence or urinary frequency/urgency Musculoskeletal ROS: negative for - joint swelling or muscular weakness Neurological ROS: as noted in HPI Dermatological ROS: negative for rash and skin lesion changes  Physical Examination: Blood pressure 91/35, pulse 63, temperature 98.2 F (36.8 C),  temperature source Oral, resp. rate 18, height 5\' 2"  (1.575 m), weight 135.626 kg (299 lb), SpO2 96 %.  HEENT-  Normocephalic, no lesions, without obvious abnormality.  Normal external eye and conjunctiva.  Normal TM's bilaterally.  Normal auditory canals and external ears. Normal external nose, mucus membranes and septum.  Normal pharynx. Cardiovascular- S1, S2 normal, pulses palpable throughout   Lungs- chest clear, no wheezing, rales, normal symmetric air entry Abdomen- soft, non-tender; bowel sounds normal; no masses,  no organomegaly Extremities- no edema Lymph-no adenopathy palpable Musculoskeletal-no joint tenderness, deformity or swelling Skin-warm and dry, no hyperpigmentation, vitiligo, or suspicious lesions  Neurological Examination Mental Status: Alert, oriented, thought content appropriate.  Speech fluent without evidence of aphasia.  Able to follow 3 step commands without difficulty. Cranial Nerves: II: Discs flat bilaterally; Visual fields grossly normal, pupils equal, round, reactive to light and accommodation III,IV, VI: ptosis not present, extra-ocular motions intact bilaterally with nystagmus on extreme lateral gaze bilaterally V,VII: smile symmetric, facial light touch sensation decreased on the left VIII: hearing normal bilaterally IX,X: gag reflex present XI: bilateral shoulder shrug XII: midline tongue extension Motor: Right : Upper extremity   5/5    Left:     Upper extremity   5/5  Lower extremity   5/5     Lower extremity   5/5 Tone and bulk:normal tone throughout; no atrophy noted Sensory: Pinprick and light touch decreased on the LUE Deep Tendon Reflexes: 1+ and symmetric with absent AJ's bilaterally Plantars: Right: downgoing   Left: downgoing Cerebellar: Normal finger-to-nose testing bilaterally.  Unable to perform heel to shin due to obesity Gait: not tested due to safety issues    Laboratory Studies:  Basic Metabolic Panel:  Recent Labs Lab  09/13/15 2302  NA 137  K 3.5  CL 104  CO2 29  GLUCOSE 110*  BUN 16  CREATININE 0.75  CALCIUM 8.6*    Liver Function Tests: No results for input(s): AST, ALT, ALKPHOS, BILITOT, PROT, ALBUMIN in the last 168 hours. No results for input(s): LIPASE, AMYLASE in the last 168 hours. No results for input(s): AMMONIA in the last 168 hours.  CBC:  Recent Labs Lab 09/13/15 2302  WBC 10.4  HGB 14.4  HCT 43.5  MCV 86.6  PLT 250    Cardiac Enzymes:  Recent Labs Lab 09/14/15 0456 09/14/15 1045 09/14/15 1629  TROPONINI <0.03 <0.03 <0.03    BNP: Invalid input(s): POCBNP  CBG:  Recent Labs Lab 09/14/15 0027 09/15/15 0925  GLUCAP 99 102*    Microbiology: Results for orders placed or performed in visit on 10/28/13  Urine culture     Status: None   Collection Time: 10/29/13  3:19 AM  Result Value Ref Range Status   Micro Text Report   Final       SOURCE: CLEAN CATCH    COMMENT                   MIXED BACTERIAL ORGANISMS  COMMENT                   RESULTS SUGGESTIVE OF CONTAMINATION   ANTIBIOTIC                                                        Coagulation Studies:  Recent Labs  09/14/15 0021  LABPROT 13.7  INR 1.03    Urinalysis:  Recent Labs Lab 09/13/15 2302  COLORURINE YELLOW*  LABSPEC 1.012  PHURINE 8.0  GLUCOSEU NEGATIVE  HGBUR NEGATIVE  BILIRUBINUR NEGATIVE  KETONESUR NEGATIVE  PROTEINUR NEGATIVE  NITRITE NEGATIVE  LEUKOCYTESUR NEGATIVE    Lipid Panel:    Component Value Date/Time   CHOL 139 05/26/2015 0502   CHOL 121 01/09/2015 1136   TRIG 187* 05/26/2015 0502   HDL 29* 05/26/2015 0502   HDL 38* 01/09/2015 1136   CHOLHDL 4.8 05/26/2015 0502   CHOLHDL 3.2 01/09/2015 1136   VLDL 37 05/26/2015 0502   LDLCALC 73 05/26/2015 0502   LDLCALC 67 01/09/2015 1136    HgbA1C:  Lab Results  Component Value Date   HGBA1C 6.3* 05/26/2015    Urine Drug Screen:  No results found for: LABOPIA, COCAINSCRNUR, LABBENZ, AMPHETMU,  THCU, LABBARB  Alcohol Level: No results for input(s): ETH in the last 168 hours.  Other results: EKG: sinus rhythm at 78 bpm.  Imaging: Ct Head Wo Contrast  09/14/2015  CLINICAL DATA:  Lightheadedness tonight. EXAM: CT HEAD WITHOUT CONTRAST TECHNIQUE: Contiguous axial images were obtained from the base of the skull through the vertex without intravenous contrast. COMPARISON:  05/25/2015 FINDINGS: Prior right temporal craniotomy and circle of Willis aneurysm clip. There is no intracranial hemorrhage. There is no extra-axial fluid collection. Brain volume is normal for age. There is slight white matter hypodensity which appears chronic and likely related to small vessel disease. Gray matter and white matter are otherwise unremarkable with normal differentiation. Normal size and configuration of the CSF spaces. No interval change from 05/25/2015. Visible paranasal sinuses are clear. IMPRESSION: No acute intracranial findings. Normal brain, with mild stable postoperative findings. Circle of Willis aneurysm clip. Electronically Signed   By: Andreas Newport M.D.   On: 09/14/2015 00:36    Assessment: 60 y.o. female presenting with acute onset vertigo.  Neurological examination otherwise unremarkable. Head CT personally reviewed and shows no acute changes.  MRI unable to be performed secondary to aneurysm repair in the past.  Patient with multiple stroke risk factors, therefore will treat as if an acute infarct.  On ASA at home.    Stroke Risk Factors - diabetes mellitus, hyperlipidemia and hypertension  Plan: 1. HgbA1c, fasting lipid panel 2. CTA of head and neck 3. PT consult 4. Echocardiogram 5. Prophylactic therapy-Antiplatelet med: Plavix - dose 75mg  daily.  Will D/C ASA 6. Telemetry monitoring 7. Frequent neuro checks 8. Valium 7.5mg  q8 prn dizziness    Alexis Goodell, MD Neurology [phone] 09/15/2015, 9:56 AM

## 2015-09-15 NOTE — Progress Notes (Signed)
[facility] [place] NAME: Kristin Mora    MR#:  [id number]  DATE OF BIRTH:  Jul 30, 1955  SUBJECTIVE:  CHIEF COMPLAINT:   Chief Complaint  Patient presents with  . Dizziness   Strong history and risk factors for stroke, came with acute onset severe dizziness. Still feels same dizzi, can not go to MRI-as there is no more information on her type of intracranial coil.  REVIEW OF SYSTEMS:  CONSTITUTIONAL: No fever, fatigue or weakness.  EYES: No blurred or double vision.  EARS, NOSE, AND THROAT: No tinnitus or ear pain.  RESPIRATORY: No cough, shortness of breath, wheezing or hemoptysis.  CARDIOVASCULAR: No chest pain, orthopnea, edema.  GASTROINTESTINAL: No nausea, vomiting, diarrhea or abdominal pain.  GENITOURINARY: No dysuria, hematuria.  ENDOCRINE: No polyuria, nocturia,  HEMATOLOGY: No anemia, easy bruising or bleeding SKIN: No rash or lesion. MUSCULOSKELETAL: No joint pain or arthritis.   NEUROLOGIC: No tingling, numbness, weakness. Have severe dizziness. PSYCHIATRY: No anxiety or depression.   ROS  DRUG ALLERGIES:   Allergies  Allergen Reactions  . Lisinopril Swelling  . Latex Rash    VITALS:  Blood pressure 130/47, pulse 75, temperature 97.7 F (36.5 C), temperature source Oral, resp. rate 20, height 5\' 2"  (1.575 m), weight 135.626 kg (299 lb), SpO2 96 %.  PHYSICAL EXAMINATION:  GENERAL:  60 y.o.-year-old patient lying in the bed with no acute distress.  EYES: Pupils equal, round, reactive to light and accommodation. No scleral icterus. Extraocular muscles intact.  HEENT: Head atraumatic, normocephalic. Oropharynx and nasopharynx clear.  NECK:  Supple, no jugular venous distention. No thyroid enlargement, no tenderness.  LUNGS: Normal breath sounds bilaterally, no wheezing, rales,rhonchi or crepitation. No use of accessory muscles of respiration.  CARDIOVASCULAR: S1, S2 normal. No murmurs, rubs, or gallops.   ABDOMEN: Soft, nontender, nondistended. Bowel sounds present. No organomegaly or mass.  EXTREMITIES: No pedal edema, cyanosis, or clubbing.  NEUROLOGIC: Cranial nerves II through XII are intact. Muscle strength 5/5 in all extremities. Sensation intact. Gait not checked. Not able to move her head or sit up, as it causes severe dizziness. PSYCHIATRIC: The patient is alert and oriented x 3.  SKIN: No obvious rash, lesion, or ulcer.   Physical Exam LABORATORY PANEL:   CBC  Recent Labs Lab 09/13/15 2302  WBC 10.4  HGB 14.4  HCT 43.5  PLT 250   ------------------------------------------------------------------------------------------------------------------  Chemistries   Recent Labs Lab 09/13/15 2302  NA 137  K 3.5  CL 104  CO2 29  GLUCOSE 110*  BUN 16  CREATININE 0.75  CALCIUM 8.6*   ------------------------------------------------------------------------------------------------------------------  Cardiac Enzymes  Recent Labs Lab 09/14/15 1045 09/14/15 1629  TROPONINI <0.03 <0.03   ------------------------------------------------------------------------------------------------------------------  RADIOLOGY:  Ct Angio Head W/cm &/or Wo Cm  09/15/2015  CLINICAL DATA:  Vertigo.  History of aneurysm clipping. EXAM: CT ANGIOGRAPHY HEAD AND NECK TECHNIQUE: Multidetector CT imaging of the head and neck was performed using the standard protocol during bolus administration of intravenous contrast. Multiplanar CT image reconstructions and MIPs were obtained to evaluate the vascular anatomy. Carotid stenosis measurements (when applicable) are obtained utilizing NASCET criteria, using the distal internal carotid diameter as the denominator. CONTRAST:  125 mL Isovue 370 IV COMPARISON:  CT head 09/13/2015 FINDINGS: CT HEAD Brain: Ventricle size is normal. Small chronic infarct right thalamus. Chronic microvascular ischemic changes in the white matter. No acute infarct. Negative for  hemorrhage or mass. Aneurysm clip in the region of the anterior  communicating artery with right pterional craniotomy. Calvarium and skull base: Right pterional craniotomy. No acute bony abnormality. Paranasal sinuses: Negative Orbits: Negative CTA NECK Aortic arch: Mild atherosclerotic calcification aortic arch without aneurysm or dissection. Streak artifact is present through the arch and proximal great vessel due to patient size. Proximal great vessels appear widely patent. Right carotid system: Right common carotid artery widely patent. Atherosclerotic calcification at the carotid bifurcation and carotid bulb. 25% diameter stenosis of the right internal carotid artery. Left carotid system: Left common carotid artery widely patent. Atherosclerotic calcification left carotid bifurcation 25% diameter stenosis or left internal carotid artery. Vertebral arteries:Both vertebral arteries are patent to the basilar without significant stenosis or dissection. Skeleton: Mild cervical spine degenerative change. No fracture. No acute bony abnormality. Other neck: Thyroid goiter. Multiple small thyroid nodules bilaterally as well as thyroid calcifications. Largest nodule left lower pole measures 15 mm in diameter. Thyroid ultrasound recommended for further evaluation. CTA HEAD Anterior circulation: Atherosclerotic calcification in the cavernous carotid bilaterally with mild stenosis bilaterally. Aneurysm clip in the region of the anterior communicating artery. No recurrent aneurysm in this area. No additional aneurysm. Anterior and middle cerebral arteries patent bilaterally without stenosis or occlusion. Posterior circulation: Both vertebral arteries patent to the basilar. PICA patent bilaterally. Basilar widely patent. AICA, superior cerebellar, posterior cerebral arteries patent bilaterally without significant stenosis or aneurysm. Venous sinuses: Patent Anatomic variants: None Delayed phase: Normal enhancement on  postcontrast imaging. No enhancing mass lesion. IMPRESSION: Postop clipping anterior communicating artery aneurysm. No recurrent aneurysm. No other aneurysm identified Atherosclerotic disease of the carotid bifurcation bilaterally causing 25% diameter stenosis of the proximal right and left internal carotid arteries. Both vertebral arteries widely patent to the basilar without stenosis. Atherosclerotic calcification in the cavernous carotid bilaterally with mild stenosis bilaterally. No significant intracranial stenosis elsewhere. Electronically Signed   By: Franchot Gallo M.D.   On: 09/15/2015 13:22   Ct Head Wo Contrast  09/14/2015  CLINICAL DATA:  Lightheadedness tonight. EXAM: CT HEAD WITHOUT CONTRAST TECHNIQUE: Contiguous axial images were obtained from the base of the skull through the vertex without intravenous contrast. COMPARISON:  05/25/2015 FINDINGS: Prior right temporal craniotomy and circle of Willis aneurysm clip. There is no intracranial hemorrhage. There is no extra-axial fluid collection. Brain volume is normal for age. There is slight white matter hypodensity which appears chronic and likely related to small vessel disease. Gray matter and white matter are otherwise unremarkable with normal differentiation. Normal size and configuration of the CSF spaces. No interval change from 05/25/2015. Visible paranasal sinuses are clear. IMPRESSION: No acute intracranial findings. Normal brain, with mild stable postoperative findings. Circle of Willis aneurysm clip. Electronically Signed   By: Andreas Newport M.D.   On: 09/14/2015 00:36   Ct Angio Neck W/cm &/or Wo/cm  09/15/2015  CLINICAL DATA:  Vertigo.  History of aneurysm clipping. EXAM: CT ANGIOGRAPHY HEAD AND NECK TECHNIQUE: Multidetector CT imaging of the head and neck was performed using the standard protocol during bolus administration of intravenous contrast. Multiplanar CT image reconstructions and MIPs were obtained to evaluate the  vascular anatomy. Carotid stenosis measurements (when applicable) are obtained utilizing NASCET criteria, using the distal internal carotid diameter as the denominator. CONTRAST:  125 mL Isovue 370 IV COMPARISON:  CT head 09/13/2015 FINDINGS: CT HEAD Brain: Ventricle size is normal. Small chronic infarct right thalamus. Chronic microvascular ischemic changes in the white matter. No acute infarct. Negative for hemorrhage or mass. Aneurysm clip in the region of the anterior communicating  artery with right pterional craniotomy. Calvarium and skull base: Right pterional craniotomy. No acute bony abnormality. Paranasal sinuses: Negative Orbits: Negative CTA NECK Aortic arch: Mild atherosclerotic calcification aortic arch without aneurysm or dissection. Streak artifact is present through the arch and proximal great vessel due to patient size. Proximal great vessels appear widely patent. Right carotid system: Right common carotid artery widely patent. Atherosclerotic calcification at the carotid bifurcation and carotid bulb. 25% diameter stenosis of the right internal carotid artery. Left carotid system: Left common carotid artery widely patent. Atherosclerotic calcification left carotid bifurcation 25% diameter stenosis or left internal carotid artery. Vertebral arteries:Both vertebral arteries are patent to the basilar without significant stenosis or dissection. Skeleton: Mild cervical spine degenerative change. No fracture. No acute bony abnormality. Other neck: Thyroid goiter. Multiple small thyroid nodules bilaterally as well as thyroid calcifications. Largest nodule left lower pole measures 15 mm in diameter. Thyroid ultrasound recommended for further evaluation. CTA HEAD Anterior circulation: Atherosclerotic calcification in the cavernous carotid bilaterally with mild stenosis bilaterally. Aneurysm clip in the region of the anterior communicating artery. No recurrent aneurysm in this area. No additional aneurysm.  Anterior and middle cerebral arteries patent bilaterally without stenosis or occlusion. Posterior circulation: Both vertebral arteries patent to the basilar. PICA patent bilaterally. Basilar widely patent. AICA, superior cerebellar, posterior cerebral arteries patent bilaterally without significant stenosis or aneurysm. Venous sinuses: Patent Anatomic variants: None Delayed phase: Normal enhancement on postcontrast imaging. No enhancing mass lesion. IMPRESSION: Postop clipping anterior communicating artery aneurysm. No recurrent aneurysm. No other aneurysm identified Atherosclerotic disease of the carotid bifurcation bilaterally causing 25% diameter stenosis of the proximal right and left internal carotid arteries. Both vertebral arteries widely patent to the basilar without stenosis. Atherosclerotic calcification in the cavernous carotid bilaterally with mild stenosis bilaterally. No significant intracranial stenosis elsewhere. Electronically Signed   By: Franchot Gallo M.D.   On: 09/15/2015 13:22    ASSESSMENT AND PLAN:   Principal Problem:   Vertigo Active Problems:   Stroke [facility])  * Vertigo/ dizziness   likely cerebellar stroke   Can not get MRI,as could not confirms compatibility of her coil in brain in aneurysmal site.   Appreciated neurology and PT consult.   She is not on aspirin- will start it.   On statin.    As per neurology - get CT angio for head and neck.  * Diabetes   Metformin   Will keep on Insulin scc.  * hypertension   amlodipine and losartan.  * hyperlipidemia   Cont statin.  All the records are reviewed and case discussed with Care Management/Social Workerr. Management plans discussed with the patient, family and they are in agreement.  CODE STATUS: Full.  TOTAL TIME TAKING CARE OF THIS PATIENT: 35 minutes.    POSSIBLE D/C IN 1-2 DAYS, DEPENDING ON CLINICAL CONDITION.   Vaughan Basta M.D on 09/15/2015   Between 7am to 6pm - Pager -  [phone]  After 6pm go to [email or web address] - password [email or web address] [facility]  Office  [phone]  CC: Primary care physician; Carmon Ginsberg, PA  Note: This dictation was prepared with Dragon dictation along with smaller phrase technology. Any transcriptional errors that result from this process are unintentional.

## 2015-09-16 ENCOUNTER — Inpatient Hospital Stay
Admit: 2015-09-16 | Discharge: 2015-09-16 | Disposition: A | Discharge status: Home / Self Care | Payer: MEDICAID | Attending: Neurology | Admitting: Neurology

## 2015-09-16 DIAGNOSIS — I639 Cerebral infarction, unspecified: Principal | ICD-10-CM

## 2015-09-16 LAB — LIPID PANEL
Cholesterol: 154 mg/dL (ref 0–200)
HDL: 30 mg/dL — AB (ref >40)
LDL CALC: 101 mg/dL — AB (ref 0–99)
TRIGLYCERIDES: 113 mg/dL (ref <150)
Total CHOL/HDL Ratio: 5.1 RATIO
VLDL: 23 mg/dL (ref 0–40)

## 2015-09-16 LAB — GLUCOSE, CAPILLARY
GLUCOSE-CAPILLARY: 97 mg/dL (ref 65–99)
GLUCOSE-CAPILLARY: 145 mg/dL — AB (ref 65–99)
GLUCOSE-CAPILLARY: 103 mg/dL — AB (ref 65–99)
Glucose-Capillary: 96 mg/dL (ref 65–99)

## 2015-09-16 MED ORDER — MECLIZINE HCL 12.5 MG PO TABS
12.5000 mg | ORAL_TABLET | Freq: Three times a day (TID) | ORAL | Status: DC
  Administered 2015-09-16 – 2015-09-17 (×3): 12.5 mg via ORAL
  Filled 2015-09-16 (×5): qty 1

## 2015-09-16 MED ORDER — CLOPIDOGREL BISULFATE 75 MG PO TABS
75.0000 mg | ORAL_TABLET | Freq: Every day | ORAL | Status: DC
  Administered 2015-09-16 – 2015-09-17 (×2): 75 mg via ORAL
  Filled 2015-09-16 (×2): qty 1

## 2015-09-16 NOTE — Progress Notes (Signed)
*  PRELIMINARY RESULTS* Echocardiogram 2D Echocardiogram has been performed.  Kristin Mora 09/16/2015, 2:13 PM

## 2015-09-16 NOTE — Progress Notes (Signed)
Medication was clarified with neurology by Ms Eulas Post director of ortho before being given

## 2015-09-16 NOTE — Care Management (Signed)
Patient has to be below 88% O2 sats for 5 minutes or more. It was only for 3 minutes 0.8% of the time. The remaining time would was above 88%. Patient may benefit from outpatient sleep study.

## 2015-09-16 NOTE — Care Management (Signed)
I have asked Kristin Mora with [place] to review overnight Oximetry to see if patient qualifies for nocturnal O2. Patient is self pay. RNCM Kristin Mora continue to follow.

## 2015-09-16 NOTE — Progress Notes (Signed)
Subjective: Patient with eyes open.  Feels she is a little better but continues to have the dizziness  Objective: Current vital signs: BP 138/66 mmHg  Pulse 71  Temp(Src) 98.2 F (36.8 C) (Oral)  Resp 18  Ht 5\' 2"  (1.575 m)  Wt 135.626 kg (299 lb)  BMI 54.67 kg/m2  SpO2 93% Vital signs in last 24 hours: Temp:  [97.7 F (36.5 C)-99.5 F (37.5 C)] 98.2 F (36.8 C) (03/22 0739) Pulse Rate:  [65-75] 71 (03/22 0739) Resp:  [18-20] 18 (03/22 0739) BP: (130-160)/(47-69) 138/66 mmHg (03/22 0739) SpO2:  [93 %-96 %] 93 % (03/22 0739)  Intake/Output from previous day: 03/21 0701 - 03/22 0700 In: 480 [P.O.:480] Out: 0  Intake/Output this shift:   Nutritional status: Diet heart healthy/carb modified Room service appropriate?: Yes; Fluid consistency:: Thin  Neurologic Exam: Mental Status: Alert, oriented, thought content appropriate. Speech fluent without evidence of aphasia. Able to follow 3 step commands without difficulty. Cranial Nerves: II: Discs flat bilaterally; Visual fields grossly normal, pupils equal, round, reactive to light and accommodation III,IV, VI: ptosis not present, extra-ocular motions intact bilaterally with nystagmus on extreme lateral gaze bilaterally V,VII: smile symmetric, facial light touch sensation decreased on the left VIII: hearing normal bilaterally IX,X: gag reflex present XI: bilateral shoulder shrug XII: midline tongue extension Motor: Right :Upper extremity 5/5Left: Upper extremity 5/5 Lower extremity 5/5Lower extremity 5/5 Tone and bulk:normal tone throughout; no atrophy noted Sensory: Pinprick and light touch decreased on the LUE  Lab Results: Basic Metabolic Panel:  Recent Labs Lab 09/13/15 2302  NA 137  K 3.5  CL 104  CO2 29  GLUCOSE 110*  BUN 16  CREATININE 0.75  CALCIUM 8.6*    Liver Function Tests: No results  for input(s): AST, ALT, ALKPHOS, BILITOT, PROT, ALBUMIN in the last 168 hours. No results for input(s): LIPASE, AMYLASE in the last 168 hours. No results for input(s): AMMONIA in the last 168 hours.  CBC:  Recent Labs Lab 09/13/15 2302  WBC 10.4  HGB 14.4  HCT 43.5  MCV 86.6  PLT 250    Cardiac Enzymes:  Recent Labs Lab 09/14/15 0456 09/14/15 1045 09/14/15 1629  TROPONINI <0.03 <0.03 <0.03    Lipid Panel:  Recent Labs Lab 09/16/15 0613  CHOL 154  TRIG 113  HDL 30*  CHOLHDL 5.1  VLDL 23  LDLCALC 101*    CBG:  Recent Labs Lab 09/15/15 0925 09/15/15 1109 09/15/15 1630 09/15/15 2114 09/16/15 0739  GLUCAP 102* 122* 106* 105* 97    Microbiology: Results for orders placed or performed in visit on 10/28/13  Urine culture     Status: None   Collection Time: 10/29/13  3:19 AM  Result Value Ref Range Status   Micro Text Report   Final       SOURCE: CLEAN CATCH    COMMENT                   MIXED BACTERIAL ORGANISMS   COMMENT                   RESULTS SUGGESTIVE OF CONTAMINATION   ANTIBIOTIC                                                        Coagulation Studies:  Recent Labs  09/14/15 0021  LABPROT 13.7  INR 1.03    Imaging: Ct Angio Head W/cm &/or Wo Cm  09/15/2015  CLINICAL DATA:  Vertigo.  History of aneurysm clipping. EXAM: CT ANGIOGRAPHY HEAD AND NECK TECHNIQUE: Multidetector CT imaging of the head and neck was performed using the standard protocol during bolus administration of intravenous contrast. Multiplanar CT image reconstructions and MIPs were obtained to evaluate the vascular anatomy. Carotid stenosis measurements (when applicable) are obtained utilizing NASCET criteria, using the distal internal carotid diameter as the denominator. CONTRAST:  125 mL Isovue 370 IV COMPARISON:  CT head 09/13/2015 FINDINGS: CT HEAD Brain: Ventricle size is normal. Small chronic infarct right thalamus. Chronic microvascular ischemic changes in the white  matter. No acute infarct. Negative for hemorrhage or mass. Aneurysm clip in the region of the anterior communicating artery with right pterional craniotomy. Calvarium and skull base: Right pterional craniotomy. No acute bony abnormality. Paranasal sinuses: Negative Orbits: Negative CTA NECK Aortic arch: Mild atherosclerotic calcification aortic arch without aneurysm or dissection. Streak artifact is present through the arch and proximal great vessel due to patient size. Proximal great vessels appear widely patent. Right carotid system: Right common carotid artery widely patent. Atherosclerotic calcification at the carotid bifurcation and carotid bulb. 25% diameter stenosis of the right internal carotid artery. Left carotid system: Left common carotid artery widely patent. Atherosclerotic calcification left carotid bifurcation 25% diameter stenosis or left internal carotid artery. Vertebral arteries:Both vertebral arteries are patent to the basilar without significant stenosis or dissection. Skeleton: Mild cervical spine degenerative change. No fracture. No acute bony abnormality. Other neck: Thyroid goiter. Multiple small thyroid nodules bilaterally as well as thyroid calcifications. Largest nodule left lower pole measures 15 mm in diameter. Thyroid ultrasound recommended for further evaluation. CTA HEAD Anterior circulation: Atherosclerotic calcification in the cavernous carotid bilaterally with mild stenosis bilaterally. Aneurysm clip in the region of the anterior communicating artery. No recurrent aneurysm in this area. No additional aneurysm. Anterior and middle cerebral arteries patent bilaterally without stenosis or occlusion. Posterior circulation: Both vertebral arteries patent to the basilar. PICA patent bilaterally. Basilar widely patent. AICA, superior cerebellar, posterior cerebral arteries patent bilaterally without significant stenosis or aneurysm. Venous sinuses: Patent Anatomic variants: None Delayed  phase: Normal enhancement on postcontrast imaging. No enhancing mass lesion. IMPRESSION: Postop clipping anterior communicating artery aneurysm. No recurrent aneurysm. No other aneurysm identified Atherosclerotic disease of the carotid bifurcation bilaterally causing 25% diameter stenosis of the proximal right and left internal carotid arteries. Both vertebral arteries widely patent to the basilar without stenosis. Atherosclerotic calcification in the cavernous carotid bilaterally with mild stenosis bilaterally. No significant intracranial stenosis elsewhere. Electronically Signed   By: Franchot Gallo M.D.   On: 09/15/2015 13:22   Ct Angio Neck W/cm &/or Wo/cm  09/15/2015  CLINICAL DATA:  Vertigo.  History of aneurysm clipping. EXAM: CT ANGIOGRAPHY HEAD AND NECK TECHNIQUE: Multidetector CT imaging of the head and neck was performed using the standard protocol during bolus administration of intravenous contrast. Multiplanar CT image reconstructions and MIPs were obtained to evaluate the vascular anatomy. Carotid stenosis measurements (when applicable) are obtained utilizing NASCET criteria, using the distal internal carotid diameter as the denominator. CONTRAST:  125 mL Isovue 370 IV COMPARISON:  CT head 09/13/2015 FINDINGS: CT HEAD Brain: Ventricle size is normal. Small chronic infarct right thalamus. Chronic microvascular ischemic changes in the white matter. No acute infarct. Negative for hemorrhage or mass. Aneurysm clip in the region of the anterior communicating artery with right pterional craniotomy.  Calvarium and skull base: Right pterional craniotomy. No acute bony abnormality. Paranasal sinuses: Negative Orbits: Negative CTA NECK Aortic arch: Mild atherosclerotic calcification aortic arch without aneurysm or dissection. Streak artifact is present through the arch and proximal great vessel due to patient size. Proximal great vessels appear widely patent. Right carotid system: Right common carotid artery  widely patent. Atherosclerotic calcification at the carotid bifurcation and carotid bulb. 25% diameter stenosis of the right internal carotid artery. Left carotid system: Left common carotid artery widely patent. Atherosclerotic calcification left carotid bifurcation 25% diameter stenosis or left internal carotid artery. Vertebral arteries:Both vertebral arteries are patent to the basilar without significant stenosis or dissection. Skeleton: Mild cervical spine degenerative change. No fracture. No acute bony abnormality. Other neck: Thyroid goiter. Multiple small thyroid nodules bilaterally as well as thyroid calcifications. Largest nodule left lower pole measures 15 mm in diameter. Thyroid ultrasound recommended for further evaluation. CTA HEAD Anterior circulation: Atherosclerotic calcification in the cavernous carotid bilaterally with mild stenosis bilaterally. Aneurysm clip in the region of the anterior communicating artery. No recurrent aneurysm in this area. No additional aneurysm. Anterior and middle cerebral arteries patent bilaterally without stenosis or occlusion. Posterior circulation: Both vertebral arteries patent to the basilar. PICA patent bilaterally. Basilar widely patent. AICA, superior cerebellar, posterior cerebral arteries patent bilaterally without significant stenosis or aneurysm. Venous sinuses: Patent Anatomic variants: None Delayed phase: Normal enhancement on postcontrast imaging. No enhancing mass lesion. IMPRESSION: Postop clipping anterior communicating artery aneurysm. No recurrent aneurysm. No other aneurysm identified Atherosclerotic disease of the carotid bifurcation bilaterally causing 25% diameter stenosis of the proximal right and left internal carotid arteries. Both vertebral arteries widely patent to the basilar without stenosis. Atherosclerotic calcification in the cavernous carotid bilaterally with mild stenosis bilaterally. No significant intracranial stenosis elsewhere.  Electronically Signed   By: Franchot Gallo M.D.   On: 09/15/2015 13:22    Medications:  I have reviewed the patient's current medications. Scheduled: . atorvastatin  40 mg Oral QPM  . clopidogrel  75 mg Oral Daily  . enoxaparin (LOVENOX) injection  40 mg Subcutaneous Q12H  . gabapentin  300 mg Oral BID  . insulin aspart  0-9 Units Subcutaneous TID WC  . LORazepam  2 mg Intravenous Once  . losartan  50 mg Oral Daily  . meclizine  12.5 mg Oral TID  . metFORMIN  500 mg Oral BID WC  . sodium chloride flush  3 mL Intravenous Q12H    Assessment/Plan: Dizziness continues.  Valium has not been requested. CTA of the head and neck shows no hemodynamically significant stenosis.  Echocardiogram is pending.  A1c 6.3.  LDL 101.  Tolerating Plavix.    Recommendations: 1.  Risk factor modification with target LDL less than 70.   2.  Continue Plavix 3.  Patient advised to ask for Valium so we can determine if this will be helpful.  First dose now.     LOS: 1 day   Alexis Goodell, MD Neurology [phone] 09/16/2015  9:13 AM

## 2015-09-16 NOTE — Progress Notes (Signed)
Pt states feels out of it after given valium 7.5 mg over 5 minutes  ordered by neurology

## 2015-09-16 NOTE — Progress Notes (Signed)
[facility] [place] NAME: Kristin Mora    MR#:  [id number]  DATE OF BIRTH:  May 19, 1956  SUBJECTIVE:  CHIEF COMPLAINT:   Chief Complaint  Patient presents with  . Dizziness   Strong history and risk factors for stroke, came with acute onset severe dizziness.  can not go to MRI-as there is no more information on her type of intracranial coil. CT angiogram of head and neck negative for any significant blockages. Some better today. REVIEW OF SYSTEMS:  CONSTITUTIONAL: No fever, fatigue or weakness.  EYES: No blurred or double vision.  EARS, NOSE, AND THROAT: No tinnitus or ear pain.  RESPIRATORY: No cough, shortness of breath, wheezing or hemoptysis.  CARDIOVASCULAR: No chest pain, orthopnea, edema.  GASTROINTESTINAL: No nausea, vomiting, diarrhea or abdominal pain.  GENITOURINARY: No dysuria, hematuria.  ENDOCRINE: No polyuria, nocturia,  HEMATOLOGY: No anemia, easy bruising or bleeding SKIN: No rash or lesion. MUSCULOSKELETAL: No joint pain or arthritis.   NEUROLOGIC: No tingling, numbness, weakness. Have severe dizziness. PSYCHIATRY: No anxiety or depression.   ROS  DRUG ALLERGIES:   Allergies  Allergen Reactions  . Lisinopril Swelling  . Latex Rash    VITALS:  Blood pressure 148/80, pulse 73, temperature 98.1 F (36.7 C), temperature source Oral, resp. rate 18, height 5\' 2"  (1.575 m), weight 135.626 kg (299 lb), SpO2 96 %.  PHYSICAL EXAMINATION:  GENERAL:  60 y.o.-year-old patient lying in the bed with no acute distress.  EYES: Pupils equal, round, reactive to light and accommodation. No scleral icterus. Extraocular muscles intact.  HEENT: Head atraumatic, normocephalic. Oropharynx and nasopharynx clear.  NECK:  Supple, no jugular venous distention. No thyroid enlargement, no tenderness.  LUNGS: Normal breath sounds bilaterally, no wheezing, rales,rhonchi or crepitation. No use of accessory muscles of respiration.   CARDIOVASCULAR: S1, S2 normal. No murmurs, rubs, or gallops.  ABDOMEN: Soft, nontender, nondistended. Bowel sounds present. No organomegaly or mass.  EXTREMITIES: No pedal edema, cyanosis, or clubbing.  NEUROLOGIC: Cranial nerves II through XII are intact. Muscle strength 5/5 in all extremities. Sensation intact. Gait not checked. Today feels little less dizzi, and able to keep her eyes open and move her head. PSYCHIATRIC: The patient is alert and oriented x 3.  SKIN: No obvious rash, lesion, or ulcer.   Physical Exam LABORATORY PANEL:   CBC  Recent Labs Lab 09/13/15 2302  WBC 10.4  HGB 14.4  HCT 43.5  PLT 250   ------------------------------------------------------------------------------------------------------------------  Chemistries   Recent Labs Lab 09/13/15 2302  NA 137  K 3.5  CL 104  CO2 29  GLUCOSE 110*  BUN 16  CREATININE 0.75  CALCIUM 8.6*   ------------------------------------------------------------------------------------------------------------------  Cardiac Enzymes  Recent Labs Lab 09/14/15 1045 09/14/15 1629  TROPONINI <0.03 <0.03   ------------------------------------------------------------------------------------------------------------------  RADIOLOGY:  Ct Angio Head W/cm &/or Wo Cm  09/15/2015  CLINICAL DATA:  Vertigo.  History of aneurysm clipping. EXAM: CT ANGIOGRAPHY HEAD AND NECK TECHNIQUE: Multidetector CT imaging of the head and neck was performed using the standard protocol during bolus administration of intravenous contrast. Multiplanar CT image reconstructions and MIPs were obtained to evaluate the vascular anatomy. Carotid stenosis measurements (when applicable) are obtained utilizing NASCET criteria, using the distal internal carotid diameter as the denominator. CONTRAST:  125 mL Isovue 370 IV COMPARISON:  CT head 09/13/2015 FINDINGS: CT HEAD Brain: Ventricle size is normal. Small chronic infarct right thalamus. Chronic  microvascular ischemic changes in the white matter. No acute infarct. Negative  for hemorrhage or mass. Aneurysm clip in the region of the anterior communicating artery with right pterional craniotomy. Calvarium and skull base: Right pterional craniotomy. No acute bony abnormality. Paranasal sinuses: Negative Orbits: Negative CTA NECK Aortic arch: Mild atherosclerotic calcification aortic arch without aneurysm or dissection. Streak artifact is present through the arch and proximal great vessel due to patient size. Proximal great vessels appear widely patent. Right carotid system: Right common carotid artery widely patent. Atherosclerotic calcification at the carotid bifurcation and carotid bulb. 25% diameter stenosis of the right internal carotid artery. Left carotid system: Left common carotid artery widely patent. Atherosclerotic calcification left carotid bifurcation 25% diameter stenosis or left internal carotid artery. Vertebral arteries:Both vertebral arteries are patent to the basilar without significant stenosis or dissection. Skeleton: Mild cervical spine degenerative change. No fracture. No acute bony abnormality. Other neck: Thyroid goiter. Multiple small thyroid nodules bilaterally as well as thyroid calcifications. Largest nodule left lower pole measures 15 mm in diameter. Thyroid ultrasound recommended for further evaluation. CTA HEAD Anterior circulation: Atherosclerotic calcification in the cavernous carotid bilaterally with mild stenosis bilaterally. Aneurysm clip in the region of the anterior communicating artery. No recurrent aneurysm in this area. No additional aneurysm. Anterior and middle cerebral arteries patent bilaterally without stenosis or occlusion. Posterior circulation: Both vertebral arteries patent to the basilar. PICA patent bilaterally. Basilar widely patent. AICA, superior cerebellar, posterior cerebral arteries patent bilaterally without significant stenosis or aneurysm. Venous  sinuses: Patent Anatomic variants: None Delayed phase: Normal enhancement on postcontrast imaging. No enhancing mass lesion. IMPRESSION: Postop clipping anterior communicating artery aneurysm. No recurrent aneurysm. No other aneurysm identified Atherosclerotic disease of the carotid bifurcation bilaterally causing 25% diameter stenosis of the proximal right and left internal carotid arteries. Both vertebral arteries widely patent to the basilar without stenosis. Atherosclerotic calcification in the cavernous carotid bilaterally with mild stenosis bilaterally. No significant intracranial stenosis elsewhere. Electronically Signed   By: Franchot Gallo M.D.   On: 09/15/2015 13:22   Ct Angio Neck W/cm &/or Wo/cm  09/15/2015  CLINICAL DATA:  Vertigo.  History of aneurysm clipping. EXAM: CT ANGIOGRAPHY HEAD AND NECK TECHNIQUE: Multidetector CT imaging of the head and neck was performed using the standard protocol during bolus administration of intravenous contrast. Multiplanar CT image reconstructions and MIPs were obtained to evaluate the vascular anatomy. Carotid stenosis measurements (when applicable) are obtained utilizing NASCET criteria, using the distal internal carotid diameter as the denominator. CONTRAST:  125 mL Isovue 370 IV COMPARISON:  CT head 09/13/2015 FINDINGS: CT HEAD Brain: Ventricle size is normal. Small chronic infarct right thalamus. Chronic microvascular ischemic changes in the white matter. No acute infarct. Negative for hemorrhage or mass. Aneurysm clip in the region of the anterior communicating artery with right pterional craniotomy. Calvarium and skull base: Right pterional craniotomy. No acute bony abnormality. Paranasal sinuses: Negative Orbits: Negative CTA NECK Aortic arch: Mild atherosclerotic calcification aortic arch without aneurysm or dissection. Streak artifact is present through the arch and proximal great vessel due to patient size. Proximal great vessels appear widely patent.  Right carotid system: Right common carotid artery widely patent. Atherosclerotic calcification at the carotid bifurcation and carotid bulb. 25% diameter stenosis of the right internal carotid artery. Left carotid system: Left common carotid artery widely patent. Atherosclerotic calcification left carotid bifurcation 25% diameter stenosis or left internal carotid artery. Vertebral arteries:Both vertebral arteries are patent to the basilar without significant stenosis or dissection. Skeleton: Mild cervical spine degenerative change. No fracture. No acute bony abnormality. Other neck:  Thyroid goiter. Multiple small thyroid nodules bilaterally as well as thyroid calcifications. Largest nodule left lower pole measures 15 mm in diameter. Thyroid ultrasound recommended for further evaluation. CTA HEAD Anterior circulation: Atherosclerotic calcification in the cavernous carotid bilaterally with mild stenosis bilaterally. Aneurysm clip in the region of the anterior communicating artery. No recurrent aneurysm in this area. No additional aneurysm. Anterior and middle cerebral arteries patent bilaterally without stenosis or occlusion. Posterior circulation: Both vertebral arteries patent to the basilar. PICA patent bilaterally. Basilar widely patent. AICA, superior cerebellar, posterior cerebral arteries patent bilaterally without significant stenosis or aneurysm. Venous sinuses: Patent Anatomic variants: None Delayed phase: Normal enhancement on postcontrast imaging. No enhancing mass lesion. IMPRESSION: Postop clipping anterior communicating artery aneurysm. No recurrent aneurysm. No other aneurysm identified Atherosclerotic disease of the carotid bifurcation bilaterally causing 25% diameter stenosis of the proximal right and left internal carotid arteries. Both vertebral arteries widely patent to the basilar without stenosis. Atherosclerotic calcification in the cavernous carotid bilaterally with mild stenosis bilaterally.  No significant intracranial stenosis elsewhere. Electronically Signed   By: Franchot Gallo M.D.   On: 09/15/2015 13:22    ASSESSMENT AND PLAN:   Principal Problem:   Vertigo Active Problems:   Stroke [facility])  * Vertigo/ dizziness   likely cerebellar stroke   Can not get MRI,as could not confirms compatibility of her coil in brain in aneurysmal site.   Appreciated neurology and PT consult.   Plavix per neurology.   On statin.    As per neurology - got CT angio for head and neck- negaive.   Echo and PT eval.   Also giving valium and meclizine to help with symptoms.  * Diabetes   Metformin   Will keep on Insulin scc.  * hypertension   amlodipine and losartan.  * hyperlipidemia   Cont statin.  All the records are reviewed and case discussed with Care Management/Social Workerr. Management plans discussed with the patient, family and they are in agreement.  CODE STATUS: Full.  TOTAL TIME TAKING CARE OF THIS PATIENT: 35 minutes.    POSSIBLE D/C IN 1-2 DAYS, DEPENDING ON CLINICAL CONDITION.   Vaughan Basta M.D on 09/16/2015   Between 7am to 6pm - Pager - [phone]  After 6pm go to [email or web address] - password [email or web address] [facility]  Office  [phone]  CC: Primary care physician; Carmon Ginsberg, PA  Note: This dictation was prepared with Dragon dictation along with smaller phrase technology. Any transcriptional errors that result from this process are unintentional.

## 2015-09-17 LAB — CBC
HEMATOCRIT: 41.4 % (ref 35.0–47.0)
Hemoglobin: 13.4 g/dL (ref 12.0–16.0)
MCH: 28.7 pg (ref 26.0–34.0)
MCHC: 32.3 g/dL (ref 32.0–36.0)
MCV: 88.9 fL (ref 80.0–100.0)
PLATELETS: 227 10*3/uL (ref 150–440)
RBC: 4.65 MIL/uL (ref 3.80–5.20)
RDW: 16.1 % — ABNORMAL HIGH (ref 11.5–14.5)
WBC: 9.7 10*3/uL (ref 3.6–11.0)

## 2015-09-17 LAB — GLUCOSE, CAPILLARY
GLUCOSE-CAPILLARY: 132 mg/dL — AB (ref 65–99)
GLUCOSE-CAPILLARY: 78 mg/dL (ref 65–99)
Glucose-Capillary: 106 mg/dL — ABNORMAL HIGH (ref 65–99)

## 2015-09-17 LAB — ECHOCARDIOGRAM COMPLETE
HEIGHTINCHES: 62 in
WEIGHTICAEL: 4784 [oz_av]

## 2015-09-17 LAB — CREATININE, SERUM
CREATININE: 0.61 mg/dL (ref 0.44–1.00)
GFR calc Af Amer: >60 mL/min (ref >60)
GFR calc non Af Amer: >60 mL/min (ref >60)

## 2015-09-17 MED ORDER — CLOPIDOGREL BISULFATE 75 MG PO TABS: 75.0000 mg | ORAL_TABLET | Freq: Every day | ORAL | Status: DC

## 2015-09-17 MED ORDER — MECLIZINE HCL 12.5 MG PO TABS: 12.5000 mg | ORAL_TABLET | Freq: Three times a day (TID) | ORAL | Status: DC

## 2015-09-17 MED ORDER — ATORVASTATIN CALCIUM 40 MG PO TABS: 40.0000 mg | ORAL_TABLET | Freq: Every evening | ORAL | Status: DC

## 2015-09-17 MED ORDER — GABAPENTIN 600 MG PO TABS: 300.0000 mg | ORAL_TABLET | Freq: Two times a day (BID) | ORAL | Status: DC

## 2015-09-17 MED ORDER — DIAZEPAM 5 MG PO TABS: 7.5000 mg | ORAL_TABLET | Freq: Three times a day (TID) | ORAL | Status: DC | PRN

## 2015-09-17 NOTE — Progress Notes (Signed)
Physical Therapy Treatment Patient Details Name: Kristin Mora MRN: [id number] DOB: 1955-12-20 Today's Date: 09/17/2015    History of Present Illness 60 yo F presented to ED for increased dizziness and feeling as though she was going to fall when getting up. She is suspected to have vertigo and also ruling in/out possibility of a stroke. CT scans were negative for acute infarct and MRI is not able to be performed due to prior history of brain aneurysm being clipped. PMH includes brain aneurysm, HTN, DM, stroke and craniotomy.     PT Comments    Pt demonstrated progress towards functional goals. C/o some mild dizziness but much improved from 2 days ago. She was able to walk up to 70 ft with FWW and min guard. Pt navigated steps with min A and B rails or with SPC and 1rail. Transfers required min guard. Frequent cues given for hand placement, staying in FWW and increasing BOS to improve balance. She is unsteady but has no complete LOB. Recommended pt ambulate with FWW and with someone near by to steady her until she is more stable on her feet. Information provided on [facility] for continued balance and gait training after hospital discharge.  Follow Up Recommendations  Home health PT;Supervision for mobility/OOB     Equipment Recommendations  Rolling walker with 5" wheels    Recommendations for Other Services       Precautions / Restrictions Precautions Precautions: Fall Restrictions Weight Bearing Restrictions: No    Mobility  Bed Mobility               General bed mobility comments: pt up at EOB  Transfers Overall transfer level: Needs assistance Equipment used: Rolling walker (2 wheeled) Transfers: Sit to/from [facility] Sit to Stand: Min guard Stand pivot transfers: Min guard       General transfer comment: cues for hand placement and anterior weight shifting  Ambulation/Gait Ambulation/Gait assistance: Min guard Ambulation  Distance (Feet): 70 Feet Assistive device: Rolling walker (2 wheeled) Gait Pattern/deviations: Decreased stride length;Decreased step length - right;Drifts right/left;Trunk flexed Gait velocity: reduced   General Gait Details: Demostrated decreased coordination of R LE when advancing R LE and foot placement/heel strike. Unsteady but no complete LOB.   Stairs Stairs: Yes Stairs assistance: Min assist Stair Management: Two rails;With cane;One rail Right Number of Stairs: 4 General stair comments: Up/down 4 steps with min A and frequent cues for safety and technique. Up/Down 1 step with SPC and 1 rail with min A and cues for technique and safety.   Wheelchair Mobility    Modified Rankin (Stroke Patients Only)       Balance Overall balance assessment: Needs assistance Sitting-balance support: Feet supported Sitting balance-Leahy Scale: Good Sitting balance - Comments: maintains   Standing balance support: Bilateral upper extremity supported Standing balance-Leahy Scale: Fair Standing balance comment: unsteady due to mild dizziness                    Cognition Arousal/Alertness: Awake/alert Behavior During Therapy: WFL for tasks assessed/performed Overall Cognitive Status: Within Functional Limits for tasks assessed                      Exercises Other Exercises Other Exercises: Ambulated 70 ft x2 with FWW and with SPC plus HHA. Pt much more steady with FWW. Slow, cautious gait with unsteadiness but no complete LOB. Other Exercises: Extensive pt education on the need for continued therapy to improve  balance and gait. Provided pt with contact information on [facility]. Recommended pt continue to use FWW at this time and to always have physical assistance to steady her when walking.    General Comments        Pertinent Vitals/Pain Pain Assessment: No/denies pain    Home Living                      Prior Function             PT Goals (current goals can now be found in the care plan section) Acute Rehab PT Goals Patient Stated Goal: to get home to see my grand baby PT Goal Formulation: With patient Time For Goal Achievement: 09/29/15 Potential to Achieve Goals: Fair Progress towards PT goals: Progressing toward goals    Frequency  Min 2X/week    PT Plan Current plan remains appropriate    Co-evaluation             End of Session Equipment Utilized During Treatment: Gait belt Activity Tolerance: Patient limited by fatigue Patient left: in chair;with call bell/phone within reach;with chair alarm set     Time: 1310-1334 PT Time Calculation (min) (ACUTE ONLY): 24 min  Charges:  $Gait Training: 8-22 mins $Therapeutic Activity: 8-22 mins                    G Codes:      Neoma Laming, PT, DPT  09/17/2015, 1:56 PM [phone]

## 2015-09-17 NOTE — Care Management (Addendum)
No benefits for home health PT. Patient may benefit from outpatient referral to [facility]. Rolling walker requested from [facility].

## 2015-09-17 NOTE — Progress Notes (Addendum)
Subjective: Patient reports that her vision is some improved.  Reports benefit from the Valium.    Objective: Current vital signs: BP 136/72 mmHg  Pulse 78  Temp(Src) 97.5 F (36.4 C) (Oral)  Resp 18  Ht 5\' 2"  (1.575 m)  Wt 135.626 kg (299 lb)  BMI 54.67 kg/m2  SpO2 95% Vital signs in last 24 hours: Temp:  [97.5 F (36.4 C)-98.3 F (36.8 C)] 97.5 F (36.4 C) (03/23 0821) Pulse Rate:  [72-78] 78 (03/23 0821) Resp:  [14-18] 18 (03/23 0821) BP: (131-148)/(58-80) 136/72 mmHg (03/23 0821) SpO2:  [93 %-97 %] 95 % (03/23 0821)  Intake/Output from previous day: 03/22 0701 - 03/23 0700 In: 360 [P.O.:360] Out: 250 [Urine:250] Intake/Output this shift:   Nutritional status: Diet heart healthy/carb modified Room service appropriate?: Yes; Fluid consistency:: Thin  Neurologic Exam: Mental Status: Alert, oriented, thought content appropriate. Speech fluent without evidence of aphasia. Able to follow 3 step commands without difficulty. Cranial Nerves: II: Discs flat bilaterally; Visual fields grossly normal, pupils equal, round, reactive to light and accommodation III,IV, VI: ptosis not present, extra-ocular motions intact bilaterally with nystagmus on extreme lateral gaze bilaterally V,VII: smile symmetric, facial light touch sensation decreased on the left VIII: hearing normal bilaterally IX,X: gag reflex present XI: bilateral shoulder shrug XII: midline tongue extension Motor: Right :Upper extremity 5/5Left: Upper extremity 5/5 Lower extremity 5/5Lower extremity 5/5  Lab Results: Basic Metabolic Panel:  Recent Labs Lab 09/13/15 2302 09/17/15 0452  NA 137  --   K 3.5  --   CL 104  --   CO2 29  --   GLUCOSE 110*  --   BUN 16  --   CREATININE 0.75 0.61  CALCIUM 8.6*  --     Liver Function Tests: No results for input(s): AST, ALT, ALKPHOS, BILITOT, PROT,  ALBUMIN in the last 168 hours. No results for input(s): LIPASE, AMYLASE in the last 168 hours. No results for input(s): AMMONIA in the last 168 hours.  CBC:  Recent Labs Lab 09/13/15 2302 09/17/15 0452  WBC 10.4 9.7  HGB 14.4 13.4  HCT 43.5 41.4  MCV 86.6 88.9  PLT 250 227    Cardiac Enzymes:  Recent Labs Lab 09/14/15 0456 09/14/15 1045 09/14/15 1629  TROPONINI <0.03 <0.03 <0.03    Lipid Panel:  Recent Labs Lab 09/16/15 0613  CHOL 154  TRIG 113  HDL 30*  CHOLHDL 5.1  VLDL 23  LDLCALC 101*    CBG:  Recent Labs Lab 09/16/15 0739 09/16/15 1218 09/16/15 1606 09/16/15 2117 09/17/15 0818  GLUCAP 97 145* 103* 96 106*    Microbiology: Results for orders placed or performed in visit on 10/28/13  Urine culture     Status: None   Collection Time: 10/29/13  3:19 AM  Result Value Ref Range Status   Micro Text Report   Final       SOURCE: CLEAN CATCH    COMMENT                   MIXED BACTERIAL ORGANISMS   COMMENT                   RESULTS SUGGESTIVE OF CONTAMINATION   ANTIBIOTIC  Coagulation Studies: No results for input(s): LABPROT, INR in the last 72 hours.  Imaging: Ct Angio Head W/cm &/or Wo Cm  09/15/2015  CLINICAL DATA:  Vertigo.  History of aneurysm clipping. EXAM: CT ANGIOGRAPHY HEAD AND NECK TECHNIQUE: Multidetector CT imaging of the head and neck was performed using the standard protocol during bolus administration of intravenous contrast. Multiplanar CT image reconstructions and MIPs were obtained to evaluate the vascular anatomy. Carotid stenosis measurements (when applicable) are obtained utilizing NASCET criteria, using the distal internal carotid diameter as the denominator. CONTRAST:  125 mL Isovue 370 IV COMPARISON:  CT head 09/13/2015 FINDINGS: CT HEAD Brain: Ventricle size is normal. Small chronic infarct right thalamus. Chronic microvascular ischemic changes in the white matter. No  acute infarct. Negative for hemorrhage or mass. Aneurysm clip in the region of the anterior communicating artery with right pterional craniotomy. Calvarium and skull base: Right pterional craniotomy. No acute bony abnormality. Paranasal sinuses: Negative Orbits: Negative CTA NECK Aortic arch: Mild atherosclerotic calcification aortic arch without aneurysm or dissection. Streak artifact is present through the arch and proximal great vessel due to patient size. Proximal great vessels appear widely patent. Right carotid system: Right common carotid artery widely patent. Atherosclerotic calcification at the carotid bifurcation and carotid bulb. 25% diameter stenosis of the right internal carotid artery. Left carotid system: Left common carotid artery widely patent. Atherosclerotic calcification left carotid bifurcation 25% diameter stenosis or left internal carotid artery. Vertebral arteries:Both vertebral arteries are patent to the basilar without significant stenosis or dissection. Skeleton: Mild cervical spine degenerative change. No fracture. No acute bony abnormality. Other neck: Thyroid goiter. Multiple small thyroid nodules bilaterally as well as thyroid calcifications. Largest nodule left lower pole measures 15 mm in diameter. Thyroid ultrasound recommended for further evaluation. CTA HEAD Anterior circulation: Atherosclerotic calcification in the cavernous carotid bilaterally with mild stenosis bilaterally. Aneurysm clip in the region of the anterior communicating artery. No recurrent aneurysm in this area. No additional aneurysm. Anterior and middle cerebral arteries patent bilaterally without stenosis or occlusion. Posterior circulation: Both vertebral arteries patent to the basilar. PICA patent bilaterally. Basilar widely patent. AICA, superior cerebellar, posterior cerebral arteries patent bilaterally without significant stenosis or aneurysm. Venous sinuses: Patent Anatomic variants: None Delayed phase:  Normal enhancement on postcontrast imaging. No enhancing mass lesion. IMPRESSION: Postop clipping anterior communicating artery aneurysm. No recurrent aneurysm. No other aneurysm identified Atherosclerotic disease of the carotid bifurcation bilaterally causing 25% diameter stenosis of the proximal right and left internal carotid arteries. Both vertebral arteries widely patent to the basilar without stenosis. Atherosclerotic calcification in the cavernous carotid bilaterally with mild stenosis bilaterally. No significant intracranial stenosis elsewhere. Electronically Signed   By: Franchot Gallo M.D.   On: 09/15/2015 13:22   Ct Angio Neck W/cm &/or Wo/cm  09/15/2015  CLINICAL DATA:  Vertigo.  History of aneurysm clipping. EXAM: CT ANGIOGRAPHY HEAD AND NECK TECHNIQUE: Multidetector CT imaging of the head and neck was performed using the standard protocol during bolus administration of intravenous contrast. Multiplanar CT image reconstructions and MIPs were obtained to evaluate the vascular anatomy. Carotid stenosis measurements (when applicable) are obtained utilizing NASCET criteria, using the distal internal carotid diameter as the denominator. CONTRAST:  125 mL Isovue 370 IV COMPARISON:  CT head 09/13/2015 FINDINGS: CT HEAD Brain: Ventricle size is normal. Small chronic infarct right thalamus. Chronic microvascular ischemic changes in the white matter. No acute infarct. Negative for hemorrhage or mass. Aneurysm clip in the region of the anterior communicating artery with  right pterional craniotomy. Calvarium and skull base: Right pterional craniotomy. No acute bony abnormality. Paranasal sinuses: Negative Orbits: Negative CTA NECK Aortic arch: Mild atherosclerotic calcification aortic arch without aneurysm or dissection. Streak artifact is present through the arch and proximal great vessel due to patient size. Proximal great vessels appear widely patent. Right carotid system: Right common carotid artery widely  patent. Atherosclerotic calcification at the carotid bifurcation and carotid bulb. 25% diameter stenosis of the right internal carotid artery. Left carotid system: Left common carotid artery widely patent. Atherosclerotic calcification left carotid bifurcation 25% diameter stenosis or left internal carotid artery. Vertebral arteries:Both vertebral arteries are patent to the basilar without significant stenosis or dissection. Skeleton: Mild cervical spine degenerative change. No fracture. No acute bony abnormality. Other neck: Thyroid goiter. Multiple small thyroid nodules bilaterally as well as thyroid calcifications. Largest nodule left lower pole measures 15 mm in diameter. Thyroid ultrasound recommended for further evaluation. CTA HEAD Anterior circulation: Atherosclerotic calcification in the cavernous carotid bilaterally with mild stenosis bilaterally. Aneurysm clip in the region of the anterior communicating artery. No recurrent aneurysm in this area. No additional aneurysm. Anterior and middle cerebral arteries patent bilaterally without stenosis or occlusion. Posterior circulation: Both vertebral arteries patent to the basilar. PICA patent bilaterally. Basilar widely patent. AICA, superior cerebellar, posterior cerebral arteries patent bilaterally without significant stenosis or aneurysm. Venous sinuses: Patent Anatomic variants: None Delayed phase: Normal enhancement on postcontrast imaging. No enhancing mass lesion. IMPRESSION: Postop clipping anterior communicating artery aneurysm. No recurrent aneurysm. No other aneurysm identified Atherosclerotic disease of the carotid bifurcation bilaterally causing 25% diameter stenosis of the proximal right and left internal carotid arteries. Both vertebral arteries widely patent to the basilar without stenosis. Atherosclerotic calcification in the cavernous carotid bilaterally with mild stenosis bilaterally. No significant intracranial stenosis elsewhere.  Electronically Signed   By: Franchot Gallo M.D.   On: 09/15/2015 13:22    Medications:  I have reviewed the patient's current medications. Scheduled: . atorvastatin  40 mg Oral QPM  . clopidogrel  75 mg Oral Daily  . enoxaparin (LOVENOX) injection  40 mg Subcutaneous Q12H  . gabapentin  300 mg Oral BID  . insulin aspart  0-9 Units Subcutaneous TID WC  . LORazepam  2 mg Intravenous Once  . losartan  50 mg Oral Daily  . meclizine  12.5 mg Oral TID  . metFORMIN  500 mg Oral BID WC  . sodium chloride flush  3 mL Intravenous Q12H    Assessment/Plan: Patient improved.  Feels she received some benefit from the Valium    Recommendations: 1.  Valium changed to po 2.  Continue Plavix   LOS: 2 days   Alexis Goodell, MD Neurology [phone] 09/17/2015  11:05 AM

## 2015-09-21 NOTE — Discharge Summary (Signed)
[place] [facility] [place] NAME: Kristin Mora    MR#:  [id number]  DATE OF BIRTH:  [birth year]  DATE OF ADMISSION:  09/13/2015 ADMITTING PHYSICIAN: Saundra Shelling, MD  DATE OF DISCHARGE: 09/17/2015  6:30 PM  PRIMARY CARE PHYSICIAN: Carmon Ginsberg, PA    ADMISSION DIAGNOSIS:  Vertigo [R42]  DISCHARGE DIAGNOSIS:  Principal Problem:   Vertigo Active Problems:   Stroke [facility])   SECONDARY DIAGNOSIS:   Past Medical History  Diagnosis Date  . Brain aneurysm 04/2007  . Hypertension     controlled with medication  . Diabetes mellitus without complication ([place])     controlled;  . Stroke ([place])   . Asthma   . HLD (hyperlipidemia)     HOSPITAL COURSE:   * Vertigo/ dizziness  likely cerebellar stroke  Can not get MRI,as could not confirms compatibility of her coil in brain in aneurysmal site.  Appreciated neurology and PT consult.  Plavix per neurology.  On statin.  As per neurology - got CT angio for head and neck- negaive.  Echo and PT eval.  Also giving valium and meclizine to help with symptoms.  * Diabetes  Metformin  Will keep on Insulin scc.  * hypertension  amlodipine and losartan.  * hyperlipidemia  Cont statin.   DISCHARGE CONDITIONS:   Stable.  CONSULTS OBTAINED:  Treatment Team:  Vaughan Basta, MD Alexis Goodell, MD  DRUG ALLERGIES:   Allergies  Allergen Reactions  . Lisinopril Swelling  . Latex Rash    DISCHARGE MEDICATIONS:   Discharge Medication List as of 09/17/2015  3:58 PM    START taking these medications   Details  clopidogrel (PLAVIX) 75 MG tablet Take 1 tablet (75 mg total) by mouth daily., Starting 09/17/2015, Until Discontinued, Print    diazepam (VALIUM) 5 MG tablet Take 1.5 tablets (7.5 mg total) by mouth every 8 (eight) hours as needed (vertigo). ONLY IF NEEDED., Starting 09/17/2015, Until Discontinued, Print    meclizine (ANTIVERT) 12.5 MG tablet Take 1  tablet (12.5 mg total) by mouth 3 (three) times daily., Starting 09/17/2015, Until Discontinued, Print      CONTINUE these medications which have CHANGED   Details  atorvastatin (LIPITOR) 40 MG tablet Take 1 tablet (40 mg total) by mouth every evening., Starting 09/17/2015, Until Discontinued, Normal    gabapentin (NEURONTIN) 600 MG tablet Take 0.5 tablets (300 mg total) by mouth 2 (two) times daily., Starting 09/17/2015, Until Discontinued, Normal      CONTINUE these medications which have NOT CHANGED   Details  amLODipine (NORVASC) 5 MG tablet Take 1 tablet (5 mg total) by mouth daily., Starting 05/29/2015, Until Discontinued, Normal    losartan (COZAAR) 50 MG tablet Take 50 mg by mouth daily., Until Discontinued, Historical Med    metFORMIN (GLUCOPHAGE) 500 MG tablet Take 1 tablet (500 mg total) by mouth 2 (two) times daily with a meal., Starting 08/14/2015, Until Discontinued, Normal         DISCHARGE INSTRUCTIONS:    Follow with PMD in 2 weeks.  If you experience worsening of your admission symptoms, develop shortness of breath, life threatening emergency, suicidal or homicidal thoughts you must seek medical attention immediately by calling 911 or calling your MD immediately  if symptoms less severe.  You Must read complete instructions/literature along with all the possible adverse reactions/side effects for all the Medicines you take and that have been prescribed to you. Take any new Medicines after you have completely understood  and accept all the possible adverse reactions/side effects.   Please note  You were cared for by a hospitalist during your hospital stay. If you have any questions about your discharge medications or the care you received while you were in the hospital after you are discharged, you can call the unit and asked to speak with the hospitalist on call if the hospitalist that took care of you is not available. Once you are discharged, your primary care physician  will handle any further medical issues. Please note that NO REFILLS for any discharge medications will be authorized once you are discharged, as it is imperative that you return to your primary care physician (or establish a relationship with a primary care physician if you do not have one) for your aftercare needs so that they can reassess your need for medications and monitor your lab values.    Today   CHIEF COMPLAINT:   Chief Complaint  Patient presents with  . Dizziness    HISTORY OF PRESENT ILLNESS:  Kristin Mora  is a 60 y.o. female with a known history of brain aneurysm, hypertension, diabetes mellitus2, CVA, hyperlipidemia presented to the emergency room with dizziness. patient took a nap yesterday evening and after waking up she is feeling dizzy. She feels as if she is going to lose balance and fall. She was holding her grandbaby and was about to fall yesterday. No history of head injury. No history of any headache or any blurry vision. Patient was given oral meclizine in the emergency room. Patient unable to get up and move around because of the dizziness. No history of chest pain. No complaints of shortness of breath. No fever or chills or cough.  VITAL SIGNS:  Blood pressure 126/46, pulse 68, temperature 98.4 F (36.9 C), temperature source Oral, resp. rate 18, height 5\' 2"  (1.575 m), weight 135.626 kg (299 lb), SpO2 99 %.  I/O:  No intake or output data in the 24 hours ending 09/21/15 1004  PHYSICAL EXAMINATION:  GENERAL:  60 y.o.-year-old patient lying in the bed with no acute distress.  EYES: Pupils equal, round, reactive to light and accommodation. No scleral icterus. Extraocular muscles intact.  HEENT: Head atraumatic, normocephalic. Oropharynx and nasopharynx clear.  NECK:  Supple, no jugular venous distention. No thyroid enlargement, no tenderness.  LUNGS: Normal breath sounds bilaterally, no wheezing, rales,rhonchi or crepitation. No use of accessory muscles of  respiration.  CARDIOVASCULAR: S1, S2 normal. No murmurs, rubs, or gallops.  ABDOMEN: Soft, non-tender, non-distended. Bowel sounds present. No organomegaly or mass.  EXTREMITIES: No pedal edema, cyanosis, or clubbing.  NEUROLOGIC: Cranial nerves II through XII are intact. Muscle strength 5/5 in all extremities. Sensation intact. Gait not checked.  PSYCHIATRIC: The patient is alert and oriented x 3.  SKIN: No obvious rash, lesion, or ulcer.   DATA REVIEW:   CBC  Recent Labs Lab 09/17/15 0452  WBC 9.7  HGB 13.4  HCT 41.4  PLT 227    Chemistries   Recent Labs Lab 09/17/15 0452  CREATININE 0.61    Cardiac Enzymes  Recent Labs Lab 09/14/15 1629  TROPONINI <0.03    Microbiology Results  Results for orders placed or performed in visit on 10/28/13  Urine culture     Status: None   Collection Time: 10/29/13  3:19 AM  Result Value Ref Range Status   Micro Text Report   Final       SOURCE: CLEAN CATCH    COMMENT  MIXED BACTERIAL ORGANISMS   COMMENT                   RESULTS SUGGESTIVE OF CONTAMINATION   ANTIBIOTIC                                                        RADIOLOGY:  No results found.  EKG:   Orders placed or performed during the hospital encounter of 09/13/15  . EKG 12-Lead  . EKG 12-Lead  . ED EKG  . ED EKG  . ED EKG  . ED EKG      Management plans discussed with the patient, family and they are in agreement.  CODE STATUS:  Code Status History    Date Active Date Inactive Code Status Order ID Comments User Context   09/14/2015  4:40 AM 09/17/2015  9:30 PM Full Code [id number]  Saundra Shelling, MD Inpatient   05/25/2015 11:28 PM 05/27/2015  8:51 PM Full Code [id number]  Lance Coon, MD Inpatient      TOTAL TIME TAKING CARE OF THIS PATIENT: 35 minutes.    Vaughan Basta M.D on 09/21/2015 at 10:04 AM  Between 7am to 6pm - Pager - [phone]  After 6pm go to [email or web address] - password [email or web address]  [facility]  Office  [phone]  CC: Primary care physician; Carmon Ginsberg, PA   Note: This dictation was prepared with Dragon dictation along with smaller phrase technology. Any transcriptional errors that result from this process are unintentional.

## 2015-10-16 ENCOUNTER — Other Ambulatory Visit: Payer: Self-pay | Admitting: Family Medicine

## 2015-10-19 ENCOUNTER — Other Ambulatory Visit: Payer: Self-pay | Admitting: Family Medicine

## 2015-10-23 ENCOUNTER — Ambulatory Visit (INDEPENDENT_AMBULATORY_CARE_PROVIDER_SITE_OTHER): Payer: Self-pay | Admitting: Family Medicine

## 2015-10-23 ENCOUNTER — Encounter: Payer: Self-pay | Admitting: Family Medicine

## 2015-10-23 VITALS — BP 122/78 | HR 72 | Temp 97.9°F | Resp 16 | Wt 294.2 lb

## 2015-10-23 DIAGNOSIS — R600 Localized edema: Secondary | ICD-10-CM

## 2015-10-23 DIAGNOSIS — I10 Essential (primary) hypertension: Secondary | ICD-10-CM

## 2015-10-23 NOTE — Progress Notes (Signed)
Subjective:     Patient ID: Kristin Mora, female   DOB: 10/07/1955, 60 y.o.   MRN: [id number]  HPI  Chief Complaint  Patient presents with  . Joint Swelling    Patient comes in office with concerns of swelling in both  ankles since being discharged from [facility] on 09/17/15.   States her shoes feel tight. Reports swelling improves overnight and with elevation. Remains on both amlodipine and gabapentin. States she also has been taking Maxzide but records suggest this was discontinued after hospitalization in April of last year. Renal function WNL during her last hospitalization in March.   Review of Systems     Objective:   Physical Exam  Constitutional: She appears well-developed and well-nourished. No distress.  Cardiovascular: Normal rate and regular rhythm.   Pulmonary/Chest: Breath sounds normal.  Musculoskeletal: She exhibits edema (trace in ankles).       Assessment:    1. Pedal edema: suspect secondary to medication. Wishes to taper off gabapentin and see if swelling improves.     2. Hypertension: Stop diuretic and stay on losartan and amlodipine. Plan:    Nurse bp check in two weeks

## 2015-10-23 NOTE — Patient Instructions (Addendum)
Discussed decreasing gabapentin to 1/2 pill daily for a week then stop. Let me know if your ankle swelling does not improve. Stop triamterene/HCTZ. Come in for a nurse bp check in two weeks.

## 2015-11-09 ENCOUNTER — Telehealth: Payer: Self-pay | Admitting: *Deleted

## 2015-11-09 NOTE — Telephone Encounter (Signed)
Unable to reach patient vociemail box was full sent patient a SMS message for her to give office a call back.KW

## 2015-11-09 NOTE — Telephone Encounter (Signed)
Patient stopped by office satueday for a blood pressure check. First check was 150/90, then rechecked later it was 140/80.

## 2015-11-09 NOTE — Telephone Encounter (Signed)
Ask her if her foot/ankle swelling improved off of gabapentin. Also would want her to have an office visit in one month. We may need to work more on her blood pressure medication.

## 2015-11-12 NOTE — Telephone Encounter (Signed)
[id number] 

## 2015-11-16 NOTE — Telephone Encounter (Signed)
[id number] 

## 2015-11-24 NOTE — Telephone Encounter (Signed)
Unable to reach patient after multiple attempts, would you like me to mail a letter home to patient? KW

## 2015-11-24 NOTE — Telephone Encounter (Signed)
Send letter telling her to come in for an office visit regarding her bp.

## 2015-11-24 NOTE — Telephone Encounter (Signed)
Letter mailed to home. KW 

## 2015-12-25 ENCOUNTER — Encounter: Payer: Self-pay | Admitting: Family Medicine

## 2015-12-25 ENCOUNTER — Ambulatory Visit (INDEPENDENT_AMBULATORY_CARE_PROVIDER_SITE_OTHER): Payer: Self-pay | Admitting: Family Medicine

## 2015-12-25 VITALS — BP 148/98 | HR 75 | Temp 97.8°F | Resp 18 | Wt 302.0 lb

## 2015-12-25 DIAGNOSIS — N3090 Cystitis, unspecified without hematuria: Secondary | ICD-10-CM

## 2015-12-25 DIAGNOSIS — E119 Type 2 diabetes mellitus without complications: Secondary | ICD-10-CM

## 2015-12-25 DIAGNOSIS — I10 Essential (primary) hypertension: Secondary | ICD-10-CM

## 2015-12-25 DIAGNOSIS — R350 Frequency of micturition: Secondary | ICD-10-CM

## 2015-12-25 LAB — POCT URINALYSIS DIPSTICK
Bilirubin, UA: NEGATIVE
Glucose, UA: NEGATIVE
Ketones, UA: NEGATIVE
Nitrite, UA: NEGATIVE
PH UA: 6.5
PROTEIN UA: NEGATIVE
Spec Grav, UA: 1.02
UROBILINOGEN UA: 0.2

## 2015-12-25 LAB — POCT GLYCOSYLATED HEMOGLOBIN (HGB A1C)
Est. average glucose Bld gHb Est-mCnc: 146
HEMOGLOBIN A1C: 6.7

## 2015-12-25 MED ORDER — NITROFURANTOIN MONOHYD MACRO 100 MG PO CAPS: 100.0000 mg | ORAL_CAPSULE | Freq: Two times a day (BID) | ORAL | Status: DC

## 2015-12-25 MED ORDER — LOSARTAN POTASSIUM 100 MG PO TABS
100.0000 mg | ORAL_TABLET | Freq: Every day | ORAL | Status: DC
Start: 2015-12-25 — End: 2017-03-02

## 2015-12-25 NOTE — Patient Instructions (Addendum)
Call to schedule mammogram at [facility]. You may use up your Losartan 50 mg.but take two at a time. We will call you with the urine culture result if you are able to provide a sample today. If you noticed a fishy smelling vaginal discharge-call me for further medication.

## 2015-12-25 NOTE — Progress Notes (Signed)
Subjective:     Patient ID: Kristin Mora, female   DOB: 18-Jan-1956, 60 y.o.   MRN: [id number]  HPI  Chief Complaint  Patient presents with  . Urinary Frequency  Also reports occasional draining cysts in her axillary and groin area. Concern from the daughter that accompanies her that she is having vaginal discharge as well, not sexually active. Last Pap with HPV normal in 2013. No recent mammogram. At last office visit we had d/ced gabapentin due to lower extremity swelling which has resolved.    Review of Systems     Objective:   Physical Exam  Constitutional: She appears well-developed and well-nourished. No distress.  Cardiovascular: Normal rate and regular rhythm.   Pulmonary/Chest: Breath sounds normal.  Musculoskeletal: She exhibits no edema (of lower extemities).  Skin:  Axillary scarring c/w hidradenitis       Assessment:    1. Urinary frequency - POCT urinalysis dipstick  2. Type 2 diabetes mellitus without complication, without long-term current use of insulin (HCC) - POCT glycosylated hemoglobin (Hb A1C)  3. Essential (primary) hypertension - losartan (COZAAR) 100 MG tablet; Take 1 tablet (100 mg total) by mouth daily.  Dispense: 90 tablet; Refill: 3  4. Cystitis: unable to provide adequate specimen for culture - nitrofurantoin, macrocrystal-monohydrate, (MACROBID) 100 MG capsule; Take 1 capsule (100 mg total) by mouth 2 (two) times daily.  Dispense: 14 capsule; Refill: 0    Plan:    Update mammogram. F/u bp in 4 weeks.

## 2016-01-22 ENCOUNTER — Ambulatory Visit: Payer: Self-pay | Admitting: Family Medicine

## 2016-05-09 ENCOUNTER — Encounter: Payer: Self-pay | Admitting: Emergency Medicine

## 2016-05-09 ENCOUNTER — Emergency Department
Admission: EM | Admit: 2016-05-09 | Discharge: 2016-05-09 | Disposition: A | Discharge status: Home / Self Care | Payer: Self-pay | Attending: Student in an Organized Health Care Education/Training Program | Admitting: Student in an Organized Health Care Education/Training Program

## 2016-05-09 ENCOUNTER — Emergency Department: Payer: Self-pay

## 2016-05-09 DIAGNOSIS — Z87891 Personal history of nicotine dependence: Secondary | ICD-10-CM | POA: Insufficient documentation

## 2016-05-09 DIAGNOSIS — Z79899 Other long term (current) drug therapy: Secondary | ICD-10-CM | POA: Insufficient documentation

## 2016-05-09 DIAGNOSIS — R42 Dizziness and giddiness: Secondary | ICD-10-CM | POA: Insufficient documentation

## 2016-05-09 DIAGNOSIS — Z5181 Encounter for therapeutic drug level monitoring: Secondary | ICD-10-CM | POA: Insufficient documentation

## 2016-05-09 DIAGNOSIS — Z91040 Latex allergy status: Secondary | ICD-10-CM | POA: Insufficient documentation

## 2016-05-09 DIAGNOSIS — J449 Chronic obstructive pulmonary disease, unspecified: Secondary | ICD-10-CM | POA: Insufficient documentation

## 2016-05-09 DIAGNOSIS — J45909 Unspecified asthma, uncomplicated: Secondary | ICD-10-CM | POA: Insufficient documentation

## 2016-05-09 DIAGNOSIS — R519 Headache, unspecified: Secondary | ICD-10-CM

## 2016-05-09 DIAGNOSIS — I10 Essential (primary) hypertension: Secondary | ICD-10-CM | POA: Insufficient documentation

## 2016-05-09 DIAGNOSIS — Z7984 Long term (current) use of oral hypoglycemic drugs: Secondary | ICD-10-CM | POA: Insufficient documentation

## 2016-05-09 DIAGNOSIS — E119 Type 2 diabetes mellitus without complications: Secondary | ICD-10-CM | POA: Insufficient documentation

## 2016-05-09 DIAGNOSIS — R51 Headache: Secondary | ICD-10-CM | POA: Insufficient documentation

## 2016-05-09 LAB — URINALYSIS COMPLETE WITH MICROSCOPIC (ARMC ONLY)
Bacteria, UA: NONE SEEN
Bilirubin Urine: NEGATIVE
Glucose, UA: NEGATIVE mg/dL
Hgb urine dipstick: NEGATIVE
KETONES UR: NEGATIVE mg/dL
Leukocytes, UA: NEGATIVE
Nitrite: NEGATIVE
PROTEIN: 30 mg/dL — AB
Specific Gravity, Urine: 1.015 (ref 1.005–1.030)
pH: 7 (ref 5.0–8.0)

## 2016-05-09 LAB — TROPONIN I

## 2016-05-09 LAB — DIFFERENTIAL
BASOS ABS: 0.1 10*3/uL (ref 0–0.1)
Basophils Relative: 1 %
EOS ABS: 0 10*3/uL (ref 0–0.7)
Eosinophils Relative: 0 %
LYMPHS ABS: 2.1 10*3/uL (ref 1.0–3.6)
Lymphocytes Relative: 17 %
MONOS PCT: 5 %
Monocytes Absolute: 0.6 10*3/uL (ref 0.2–0.9)
Neutro Abs: 9.5 10*3/uL — ABNORMAL HIGH (ref 1.4–6.5)
Neutrophils Relative %: 77 %

## 2016-05-09 LAB — COMPREHENSIVE METABOLIC PANEL
ALK PHOS: 75 U/L (ref 38–126)
ALT: 35 U/L (ref 14–54)
AST: 43 U/L — AB (ref 15–41)
Albumin: 3.6 g/dL (ref 3.5–5.0)
Anion gap: 9 (ref 5–15)
BILIRUBIN TOTAL: 0.8 mg/dL (ref 0.3–1.2)
BUN: 13 mg/dL (ref 6–20)
CALCIUM: 9 mg/dL (ref 8.9–10.3)
CO2: 29 mmol/L (ref 22–32)
CREATININE: 0.66 mg/dL (ref 0.44–1.00)
Chloride: 99 mmol/L — ABNORMAL LOW (ref 101–111)
GFR calc Af Amer: >60 mL/min (ref >60)
Glucose, Bld: 135 mg/dL — ABNORMAL HIGH (ref 65–99)
POTASSIUM: 3.4 mmol/L — AB (ref 3.5–5.1)
Sodium: 137 mmol/L (ref 135–145)
TOTAL PROTEIN: 8.3 g/dL — AB (ref 6.5–8.1)

## 2016-05-09 LAB — CBC
HEMATOCRIT: 48.6 % — AB (ref 35.0–47.0)
HEMOGLOBIN: 15.8 g/dL (ref 12.0–16.0)
MCH: 27.2 pg (ref 26.0–34.0)
MCHC: 32.5 g/dL (ref 32.0–36.0)
MCV: 83.6 fL (ref 80.0–100.0)
Platelets: 241 10*3/uL (ref 150–440)
RBC: 5.81 MIL/uL — ABNORMAL HIGH (ref 3.80–5.20)
RDW: 16 % — ABNORMAL HIGH (ref 11.5–14.5)
WBC: 12.3 10*3/uL — ABNORMAL HIGH (ref 3.6–11.0)

## 2016-05-09 LAB — GLUCOSE, CAPILLARY: GLUCOSE-CAPILLARY: 138 mg/dL — AB (ref 65–99)

## 2016-05-09 LAB — PROTIME-INR
INR: 0.95
Prothrombin Time: 12.7 seconds (ref 11.4–15.2)

## 2016-05-09 LAB — APTT: APTT: 28 s (ref 24–36)

## 2016-05-09 MED ORDER — DIPHENHYDRAMINE HCL 50 MG/ML IJ SOLN
25.0000 mg | Freq: Once | INTRAMUSCULAR | Status: AC
  Administered 2016-05-09: 25 mg via INTRAVENOUS
  Filled 2016-05-09: qty 1

## 2016-05-09 MED ORDER — SODIUM CHLORIDE 0.9 % IV BOLUS (SEPSIS)
500.0000 mL | Freq: Once | INTRAVENOUS | Status: AC
  Administered 2016-05-09: 500 mL via INTRAVENOUS

## 2016-05-09 MED ORDER — ACETAMINOPHEN 500 MG PO TABS
1000.0000 mg | ORAL_TABLET | Freq: Once | ORAL | Status: AC
  Administered 2016-05-09: 1000 mg via ORAL
  Filled 2016-05-09: qty 2

## 2016-05-09 MED ORDER — PROCHLORPERAZINE EDISYLATE 5 MG/ML IJ SOLN
10.0000 mg | Freq: Once | INTRAMUSCULAR | Status: AC
  Administered 2016-05-09: 10 mg via INTRAVENOUS
  Filled 2016-05-09: qty 2

## 2016-05-09 MED ORDER — IOPAMIDOL (ISOVUE-370) INJECTION 76%
75.0000 mL | Freq: Once | INTRAVENOUS | Status: AC | PRN
  Administered 2016-05-09: 75 mL via INTRAVENOUS

## 2016-05-09 NOTE — Discharge Instructions (Signed)

## 2016-05-09 NOTE — ED Provider Notes (Addendum)
[facility] Emergency Department Provider Note    None    (approximate)  I have reviewed the triage vital signs and the nursing notes.   HISTORY  Chief Complaint Migraine; Nausea; and Dizziness    HPI Kristin Mora is a 60 y.o. female  presents with sudden onset headache at 6 AM today. It is associated with nausea and generalized dizziness. Denies any numbness or tingling. Patient states that the headache is most prominent on the left side of her head. She denies any visual disturbance. She is on Plavix and has a history of brain aneurysm and has had a subarachnoid in the past. States last time she had a headache like this she was transferred for [facility] due to the aneurysm. Denies any neck stiffness. Denies any chest pain or abdominal pain.   Past Medical History:  Diagnosis Date  . Asthma   . Brain aneurysm 04/2007  . Diabetes mellitus without complication ([place])    controlled;  . HLD (hyperlipidemia)   . Hypertension    controlled with medication  . Stroke [facility])    Family History  Problem Relation Age of Onset  . Heart attack Mother   . Coronary artery disease Mother   . Lung cancer Father   . Diabetes Sister   . Stroke Brother   . Uterine cancer Sister   . Lung cancer Sister   . Bone cancer Brother    Past Surgical History:  Procedure Laterality Date  . CRANIOTOMY  04/2007  . KNEE SURGERY    . TONSILLECTOMY AND ADENOIDECTOMY    . TUBAL LIGATION     Patient Active Problem List   Diagnosis Date Noted  . Stroke ([place]) 09/15/2015  . Vertigo 09/14/2015  . Chronic airway obstruction ([place]) 11/04/2014  . Avitaminosis D 11/04/2014  . Adiposity 11/04/2014  . Hypercholesteremia 11/04/2014  . Hemorrhagic cerebrovascular accident (CVA) ([place]) 11/04/2014  . Diabetes mellitus, type 2 ([place]) 10/01/2014  . Essential (primary) hypertension 10/01/2014  . Thalamic hemorrhage ([place]) 10/01/2014      Prior to Admission medications   Medication  Sig Start Date End Date Taking? Authorizing Provider  amLODipine (NORVASC) 5 MG tablet Take 1 tablet (5 mg total) by mouth daily. 05/29/15   Carmon Ginsberg, PA  atorvastatin (LIPITOR) 40 MG tablet Take 1 tablet (40 mg total) by mouth every evening. 09/17/15   Vaughan Basta, MD  clopidogrel (PLAVIX) 75 MG tablet Take 1 tablet (75 mg total) by mouth daily. 09/17/15   Vaughan Basta, MD  diazepam (VALIUM) 5 MG tablet Take 1.5 tablets (7.5 mg total) by mouth every 8 (eight) hours as needed (vertigo). ONLY IF NEEDED. 09/17/15   Vaughan Basta, MD  gabapentin (NEURONTIN) 600 MG tablet Take 0.5 tablets (300 mg total) by mouth 2 (two) times daily. Patient not taking: Reported on 12/25/2015 09/17/15   Vaughan Basta, MD  losartan (COZAAR) 100 MG tablet Take 1 tablet (100 mg total) by mouth daily. 12/25/15   Carmon Ginsberg, PA  meclizine (ANTIVERT) 12.5 MG tablet Take 1 tablet (12.5 mg total) by mouth 3 (three) times daily. 09/17/15   Vaughan Basta, MD  metFORMIN (GLUCOPHAGE) 500 MG tablet Take 1 tablet (500 mg total) by mouth 2 (two) times daily with a meal. 08/14/15   Carmon Ginsberg, PA  nitrofurantoin, macrocrystal-monohydrate, (MACROBID) 100 MG capsule Take 1 capsule (100 mg total) by mouth 2 (two) times daily. 12/25/15   Carmon Ginsberg, PA    Allergies Lisinopril and Latex    Social  History Social History  Substance Use Topics  . Smoking status: Former Smoker    Quit date: 11/25/2009  . Smokeless tobacco: Not on file  . Alcohol use No    Review of Systems Patient denies headaches, rhinorrhea, blurry vision, numbness, shortness of breath, chest pain, edema, cough, abdominal pain, nausea, vomiting, diarrhea, dysuria, fevers, rashes or hallucinations unless otherwise stated above in HPI. ____________________________________________   PHYSICAL EXAM:  VITAL SIGNS: Vitals:   05/09/16 1800 05/09/16 1906  BP: (!) 176/86 (!) 155/73  Pulse: (!) 56 75  Resp: 14 18    Temp:      Constitutional: Alert and oriented. in no acute distress. Eyes: Conjunctivae are normal. PERRL. EOMI. Head: Atraumatic. Nose: No congestion/rhinnorhea. Mouth/Throat: Mucous membranes are moist.  Oropharynx non-erythematous. Neck: No stridor. Painless ROM. No cervical spine tenderness to palpation Hematological/Lymphatic/Immunilogical: No cervical lymphadenopathy. Cardiovascular: Normal rate, regular rhythm. Grossly normal heart sounds.  Good peripheral circulation. Respiratory: Normal respiratory effort.  No retractions. Lungs CTAB. Gastrointestinal: Soft and nontender. No distention. No abdominal bruits. No CVA tenderness. Musculoskeletal: No lower extremity tenderness nor edema.  No joint effusions. Neurologic:  CN- intact.  No facial droop, Normal FNF.  Normal heel to shin.  Sensation intact bilaterally. Normal speech and language. No gross focal neurologic deficits are appreciated. No gait instability.  Skin:  Skin is warm, dry and intact. No rash noted. Psychiatric: Mood and affect are normal. Speech and behavior are normal.  ____________________________________________   LABS (all labs ordered are listed, but only abnormal results are displayed)  Results for orders placed or performed during the hospital encounter of 05/09/16 (from the past 24 hour(s))  Glucose, capillary     Status: Abnormal   Collection Time: 05/09/16  2:37 PM  Result Value Ref Range   Glucose-Capillary 138 (H) 65 - 99 mg/dL  Protime-INR     Status: None   Collection Time: 05/09/16  2:38 PM  Result Value Ref Range   Prothrombin Time 12.7 11.4 - 15.2 seconds   INR 0.95   APTT     Status: None   Collection Time: 05/09/16  2:38 PM  Result Value Ref Range   aPTT 28 24 - 36 seconds  CBC     Status: Abnormal   Collection Time: 05/09/16  2:38 PM  Result Value Ref Range   WBC 12.3 (H) 3.6 - 11.0 K/uL   RBC 5.81 (H) 3.80 - 5.20 MIL/uL   Hemoglobin 15.8 12.0 - 16.0 g/dL   HCT 48.6 (H) 35.0 -  47.0 %   MCV 83.6 80.0 - 100.0 fL   MCH 27.2 26.0 - 34.0 pg   MCHC 32.5 32.0 - 36.0 g/dL   RDW 16.0 (H) 11.5 - 14.5 %   Platelets 241 150 - 440 K/uL  Differential     Status: Abnormal   Collection Time: 05/09/16  2:38 PM  Result Value Ref Range   Neutrophils Relative % 77 %   Neutro Abs 9.5 (H) 1.4 - 6.5 K/uL   Lymphocytes Relative 17 %   Lymphs Abs 2.1 1.0 - 3.6 K/uL   Monocytes Relative 5 %   Monocytes Absolute 0.6 0.2 - 0.9 K/uL   Eosinophils Relative 0 %   Eosinophils Absolute 0.0 0 - 0.7 K/uL   Basophils Relative 1 %   Basophils Absolute 0.1 0 - 0.1 K/uL  Comprehensive metabolic panel     Status: Abnormal   Collection Time: 05/09/16  2:38 PM  Result Value Ref Range   Sodium  137 135 - 145 mmol/L   Potassium 3.4 (L) 3.5 - 5.1 mmol/L   Chloride 99 (L) 101 - 111 mmol/L   CO2 29 22 - 32 mmol/L   Glucose, Bld 135 (H) 65 - 99 mg/dL   BUN 13 6 - 20 mg/dL   Creatinine, Ser 0.66 0.44 - 1.00 mg/dL   Calcium 9.0 8.9 - 10.3 mg/dL   Total Protein 8.3 (H) 6.5 - 8.1 g/dL   Albumin 3.6 3.5 - 5.0 g/dL   AST 43 (H) 15 - 41 U/L   ALT 35 14 - 54 U/L   Alkaline Phosphatase 75 38 - 126 U/L   Total Bilirubin 0.8 0.3 - 1.2 mg/dL   GFR calc non Af Amer >60 >60 mL/min   GFR calc Af Amer >60 >60 mL/min   Anion gap 9 5 - 15  Troponin I     Status: None   Collection Time: 05/09/16  2:38 PM  Result Value Ref Range   Troponin I <0.03 <0.03 ng/mL  Urinalysis complete, with microscopic ([facility] only)     Status: Abnormal   Collection Time: 05/09/16  3:53 PM  Result Value Ref Range   Color, Urine YELLOW (A) YELLOW   APPearance CLEAR (A) CLEAR   Glucose, UA NEGATIVE NEGATIVE mg/dL   Bilirubin Urine NEGATIVE NEGATIVE   Ketones, ur NEGATIVE NEGATIVE mg/dL   Specific Gravity, Urine 1.015 1.005 - 1.030   Hgb urine dipstick NEGATIVE NEGATIVE   pH 7.0 5.0 - 8.0   Protein, ur 30 (A) NEGATIVE mg/dL   Nitrite NEGATIVE NEGATIVE   Leukocytes, UA NEGATIVE NEGATIVE   RBC / HPF 0-5 0 - 5 RBC/hpf   WBC, UA  0-5 0 - 5 WBC/hpf   Bacteria, UA NONE SEEN NONE SEEN   Squamous Epithelial / LPF 0-5 (A) NONE SEEN   Mucous PRESENT    ____________________________________________  EKG ED ECG REPORT I, Merlyn Lot, the attending physician, personally viewed and interpreted this ECG.   Date: 05/18/2016  EKG Time: 14:37  Rate: 70  Rhythm: normal EKG, normal sinus rhythm, unchanged from previous tracings  Axis: normal  Intervals:normal  ST&T Change: no acute st t wave changes  ____________________________________________  RADIOLOGY  I personally reviewed all radiographic images ordered to evaluate for the above acute complaints and reviewed radiology reports and findings.  These findings were personally discussed with the patient.  Please see medical record for radiology report.  ____________________________________________   PROCEDURES  Procedure(s) performed: none Procedures    Critical Care performed: no ____________________________________________   INITIAL IMPRESSION / ASSESSMENT AND PLAN / ED COURSE  Pertinent labs & imaging results that were available during my care of the patient were reviewed by me and considered in my medical decision making (see chart for details).  DDX: sah, sdh, aneurysm, migraine, tension, meningitis  Kristin Mora is a 60 y.o. who presents to the ED with complaint of acute headache similar to previous started at 6 AM today associated with nausea and dizziness.  Patient afebrile but hypertensive. No focal neuro deficits on exam. Based on her history of aneurysm will order emergent neuroimaging to evaluate for any evidence of acute bleed. We'll provide symptomatically treatment as patient also has a history of migraine headaches and this could be component presentation.  Clinical Course as of May 10 2115  Mon May 09, 2016  1549 Reassessed and states that headache has improved.  [PR]  1727 Checked on patient.  Currently resting confortable   [PR]  1840 Based on patient's negative CT scan and acute onset headache and recommended lumbar puncture to further evaluate and rule out any component of subarachnoid hemorrhage. Discussed risks and benefits of this procedure the patient declined further evaluation with lumbar puncture as her headache had significantly improved and she no longer has any dizziness.  [PR]  1915 Patient was able to tolerate PO and was able to ambulate with a steady gait.  Patient requesting discharge home. We'll provide referral for follow-up with neurology and PCP.  Have discussed with the patient and available family all diagnostics and treatments performed thus far and all questions were answered to the best of my ability. The patient demonstrates understanding and agreement with plan.   [PR]    Clinical Course User Index [PR] Merlyn Lot, MD     ____________________________________________   FINAL CLINICAL IMPRESSION(S) / ED DIAGNOSES  Final diagnoses:  Acute nonintractable headache, unspecified headache type  Dizzinesses      NEW MEDICATIONS STARTED DURING THIS VISIT:  Discharge Medication List as of 05/09/2016  7:18 PM       Note:  This document was prepared using Dragon voice recognition software and may include unintentional dictation errors.    Merlyn Lot, MD 05/09/16 2116    Merlyn Lot, MD 05/18/16 [phone]

## 2016-05-09 NOTE — ED Notes (Signed)
recvd report from [place], Therapist, sports.

## 2016-05-09 NOTE — ED Notes (Signed)
CODE STROKE CALLED TO [phone] 

## 2016-05-09 NOTE — ED Notes (Signed)
Patient transported to CT 

## 2016-05-09 NOTE — ED Triage Notes (Addendum)
Pt reports migraine headache that began at 0600 today. Pt reports nausea and dizziness as well. Bilateral strong hand grips. Facial symmetry intact. Pt alert and oriented. Speech clear.

## 2016-05-09 NOTE — ED Notes (Signed)
Pt ambulated to BR w/o issue

## 2016-05-10 NOTE — Progress Notes (Signed)
PG to ED. When [facility] arrived, Dr. apologized for the false alarm. [place] available as needed.

## 2016-06-29 ENCOUNTER — Ambulatory Visit (INDEPENDENT_AMBULATORY_CARE_PROVIDER_SITE_OTHER): Payer: Self-pay | Admitting: Family Medicine

## 2016-06-29 ENCOUNTER — Other Ambulatory Visit: Payer: Self-pay | Admitting: Family Medicine

## 2016-06-29 ENCOUNTER — Encounter: Payer: Self-pay | Admitting: Family Medicine

## 2016-06-29 VITALS — BP 150/96 | HR 82 | Temp 97.6°F | Resp 16 | Wt 308.4 lb

## 2016-06-29 DIAGNOSIS — L089 Local infection of the skin and subcutaneous tissue, unspecified: Secondary | ICD-10-CM

## 2016-06-29 DIAGNOSIS — E119 Type 2 diabetes mellitus without complications: Secondary | ICD-10-CM

## 2016-06-29 DIAGNOSIS — L732 Hidradenitis suppurativa: Secondary | ICD-10-CM

## 2016-06-29 DIAGNOSIS — I10 Essential (primary) hypertension: Secondary | ICD-10-CM

## 2016-06-29 LAB — HEMOGLOBIN A1C: HEMOGLOBIN A1C: 6.5

## 2016-06-29 LAB — POCT GLYCOSYLATED HEMOGLOBIN (HGB A1C): Hemoglobin A1C: 6.5

## 2016-06-29 MED ORDER — DOXYCYCLINE HYCLATE 100 MG PO TABS: 100.0000 mg | ORAL_TABLET | Freq: Two times a day (BID) | ORAL | 0 refills | Status: DC

## 2016-06-29 NOTE — Patient Instructions (Addendum)
Nurse BP check in one week. We will call you about the dermatology appointment.

## 2016-06-29 NOTE — Progress Notes (Signed)
Subjective:     Patient ID: Kristin Mora, female   DOB: [birth year], 61 y.o.   MRN: [id number]  HPI  Chief Complaint  Patient presents with  . Skin Problem    Patient comes in office with concerns of multiple cyst/boils underneath her arms that has been intermittent for the past 1-75months. Patient states that skin has been opening and draining, she describes skin as raw.   . Diabetes    Patient returns to office for diabetes follow up, last office visit was 12/25/15. HgbA1C at last visit was 6.7%. Patient reports good compliance an tolerance on medication but reports multiple sores on her upper and lower extremities.   . Hypertension    Patient returns to office for follow up, last office visit was 12/25/15. At last visit patient was advised to continue Losartan 100mg , blood pressure in house was 148/98. Patient reports good tolerance and compliance.   I have felt in the past she had hidradenitis. Reports she is just refilling her bp medication and has missed a couple of doses.   Review of Systems  Respiratory: Negative for shortness of breath.   Cardiovascular: Negative for chest pain and palpitations.  Neurological: Negative for headaches.       Objective:   Physical Exam  Constitutional: She appears well-developed and well-nourished. No distress.  Skin:  Bilateral axillary areas appear raw with  one or two denuded spots. No pus or vesicles noted. Also has a few 2-3 mm pustules on her trunk/arms with several areas of post-inflammatory hyperpigmentation.   Lungs: clear Heart: RRR without murmur Lower extremities: no edema; pedal pulses intact, sensation to monofilament intact, no wounds noted.     Assessment:    1. Type 2 diabetes mellitus without complication, without long-term current use of insulin ([place]): controlled - POCT glycosylated hemoglobin (Hb A1C)  2. Essential (primary) hypertension: recheck in a week on medication  3. Axillary hidradenitis suppurativa - doxycycline  (VIBRA-TABS) 100 MG tablet; Take 1 tablet (100 mg total) by mouth 2 (two) times daily.  Dispense: 20 tablet; Refill: 0 - Ambulatory referral to Dermatology  4. Pustules determined by examination - doxycycline (VIBRA-TABS) 100 MG tablet; Take 1 tablet (100 mg total) by mouth 2 (two) times daily.  Dispense: 20 tablet; Refill: 0 - Ambulatory referral to Dermatology    Plan:    Recheck bp in one week.

## 2016-07-06 ENCOUNTER — Ambulatory Visit: Payer: Self-pay | Admitting: Family Medicine

## 2016-07-07 ENCOUNTER — Ambulatory Visit: Payer: Self-pay | Admitting: Family Medicine

## 2016-07-08 ENCOUNTER — Encounter: Payer: Self-pay | Admitting: Family Medicine

## 2016-07-08 ENCOUNTER — Ambulatory Visit (INDEPENDENT_AMBULATORY_CARE_PROVIDER_SITE_OTHER): Payer: Self-pay | Admitting: Family Medicine

## 2016-07-08 VITALS — BP 138/84 | HR 82 | Temp 98.0°F | Resp 16 | Wt 303.2 lb

## 2016-07-08 DIAGNOSIS — H1032 Unspecified acute conjunctivitis, left eye: Secondary | ICD-10-CM

## 2016-07-08 MED ORDER — SULFACETAMIDE SODIUM 10 % OP SOLN: 2.0000 [drp] | Freq: Four times a day (QID) | OPHTHALMIC | 0 refills | Status: DC

## 2016-07-08 NOTE — Progress Notes (Signed)
Subjective:     Patient ID: Kristin Mora, female   DOB: February 19, 1956, 61 y.o.   MRN: [id number]  HPI  Chief Complaint  Patient presents with  . Eye Problem    Patient comes in office today with concerns of redness of her left eye for the past 5 days. Patient reports swelling of upper lid and eye being itchy. Patient denies drainage or crusting of lid  Denies changes in vision, contact use, or foreign body.Reports G.I. Symptoms prior to the onset of her eye discomfort.   Review of Systems     Objective:   Physical Exam  Constitutional: She appears well-developed and well-nourished. No distress.  Eyes: Pupils are equal, round, and reactive to light.  Left sclera injected. No f.b.or drainage noted. Tactile pressures symmetrical.       Assessment:    1. Acute conjunctivitis of left eye, unspecified acute conjunctivitis type - sulfacetamide (BLEPH-10) 10 % ophthalmic solution; Place 2 drops into the left eye 4 (four) times daily.  Dispense: 15 mL; Refill: 0    Plan:   Encouraged use of warm compresses. To see her eye doctor if not improving over the next few days.

## 2016-07-08 NOTE — Progress Notes (Signed)
Subjective:     Patient ID: Kristin Mora, female   DOB: January 29, 1956, 61 y.o.   MRN: [id number]  HPI   Review of Systems     Objective:   Physical Exam     Assessment:        Plan:

## 2016-07-08 NOTE — Patient Instructions (Addendum)
Discussed use of warm compresses to the left eye several x day. If not improving over the next few days to see your eye doctor.

## 2016-07-10 ENCOUNTER — Emergency Department
Admission: EM | Admit: 2016-07-10 | Discharge: 2016-07-10 | Disposition: A | Discharge status: Home / Self Care | Payer: Self-pay | Attending: Emergency Medicine | Admitting: Emergency Medicine

## 2016-07-10 DIAGNOSIS — I10 Essential (primary) hypertension: Secondary | ICD-10-CM | POA: Insufficient documentation

## 2016-07-10 DIAGNOSIS — J449 Chronic obstructive pulmonary disease, unspecified: Secondary | ICD-10-CM | POA: Insufficient documentation

## 2016-07-10 DIAGNOSIS — Z79899 Other long term (current) drug therapy: Secondary | ICD-10-CM | POA: Insufficient documentation

## 2016-07-10 DIAGNOSIS — Z91040 Latex allergy status: Secondary | ICD-10-CM | POA: Insufficient documentation

## 2016-07-10 DIAGNOSIS — Z7984 Long term (current) use of oral hypoglycemic drugs: Secondary | ICD-10-CM | POA: Insufficient documentation

## 2016-07-10 DIAGNOSIS — L732 Hidradenitis suppurativa: Secondary | ICD-10-CM | POA: Insufficient documentation

## 2016-07-10 DIAGNOSIS — Z87891 Personal history of nicotine dependence: Secondary | ICD-10-CM | POA: Insufficient documentation

## 2016-07-10 DIAGNOSIS — E119 Type 2 diabetes mellitus without complications: Secondary | ICD-10-CM | POA: Insufficient documentation

## 2016-07-10 DIAGNOSIS — J45909 Unspecified asthma, uncomplicated: Secondary | ICD-10-CM | POA: Insufficient documentation

## 2016-07-10 HISTORY — DX: Dizziness and giddiness: R42

## 2016-07-10 MED ORDER — CLINDAMYCIN PHOSPHATE 1 % EX GEL: CUTANEOUS | 0 refills | Status: DC

## 2016-07-10 NOTE — ED Provider Notes (Signed)
[facility] Emergency Department Provider Note  ____________________________________________  Time seen: Approximately 3:28 PM  I have reviewed the triage vital signs and the nursing notes.   HISTORY  Chief Complaint Rash and Abscess    HPI Kristin Mora is a 61 y.o. female with a history of diabetes presents to the emergency department with hydradenitis suppurativa. Hydradenitis suppurativa is managed by her primary care provider. Patient is currently taking doxycycline. Patient came to the emergency department because she is concerned for a lack of skin healing at a 2.5 cm x 1 cm abscess remnant in the skin localized to her left axilla. Patient denies fever. Patient states that left axillary pain improved after doxycycline use. She has noticed no erythema or streaking from the left axilla. Patient states that she has been taking her doxycycline as directed. Patient has not tried topical clindamycin in the past.    Past Medical History:  Diagnosis Date  . Asthma   . Brain aneurysm 04/2007  . Diabetes mellitus without complication ([place])    controlled;  . HLD (hyperlipidemia)   . Hypertension    controlled with medication  . Stroke ([place])   . Vertigo     Patient Active Problem List   Diagnosis Date Noted  . Axillary hidradenitis suppurativa 06/29/2016  . Stroke ([place]) 09/15/2015  . Vertigo 09/14/2015  . Chronic airway obstruction ([place]) 11/04/2014  . Avitaminosis D 11/04/2014  . Adiposity 11/04/2014  . Hypercholesteremia 11/04/2014  . Hemorrhagic cerebrovascular accident (CVA) ([place]) 11/04/2014  . Diabetes mellitus, type 2 ([place]) 10/01/2014  . Essential (primary) hypertension 10/01/2014  . Thalamic hemorrhage ([place]) 10/01/2014    Past Surgical History:  Procedure Laterality Date  . CRANIOTOMY  04/2007  . KNEE SURGERY    . TONSILLECTOMY AND ADENOIDECTOMY    . TUBAL LIGATION      Prior to Admission medications   Medication Sig Start Date End  Date Taking? Authorizing Provider  amLODipine (NORVASC) 5 MG tablet Take 1 tablet (5 mg total) by mouth daily. 05/29/15   Carmon Ginsberg, PA  atorvastatin (LIPITOR) 40 MG tablet Take 1 tablet (40 mg total) by mouth every evening. 09/17/15   Vaughan Basta, MD  clindamycin (CLINDAGEL) 1 % gel Apply to affected area 2 times daily 07/10/16 07/10/17  Lannie Fields, PA-C  clopidogrel (PLAVIX) 75 MG tablet Take 1 tablet (75 mg total) by mouth daily. 09/17/15   Vaughan Basta, MD  diazepam (VALIUM) 5 MG tablet Take 1.5 tablets (7.5 mg total) by mouth every 8 (eight) hours as needed (vertigo). ONLY IF NEEDED. 09/17/15   Vaughan Basta, MD  doxycycline (VIBRA-TABS) 100 MG tablet Take 1 tablet (100 mg total) by mouth 2 (two) times daily. 06/29/16   Carmon Ginsberg, PA  gabapentin (NEURONTIN) 600 MG tablet Take 0.5 tablets (300 mg total) by mouth 2 (two) times daily. 09/17/15   Vaughan Basta, MD  losartan (COZAAR) 100 MG tablet Take 1 tablet (100 mg total) by mouth daily. 12/25/15   Carmon Ginsberg, PA  meclizine (ANTIVERT) 12.5 MG tablet Take 1 tablet (12.5 mg total) by mouth 3 (three) times daily. 09/17/15   Vaughan Basta, MD  metFORMIN (GLUCOPHAGE) 500 MG tablet Take 1 tablet (500 mg total) by mouth 2 (two) times daily with a meal. 08/14/15   Carmon Ginsberg, PA  sulfacetamide (BLEPH-10) 10 % ophthalmic solution Place 2 drops into the left eye 4 (four) times daily. 07/08/16   Carmon Ginsberg, PA    Allergies Lisinopril and Latex  Family  History  Problem Relation Age of Onset  . Heart attack Mother   . Coronary artery disease Mother   . Lung cancer Father   . Diabetes Sister   . Stroke Brother   . Uterine cancer Sister   . Lung cancer Sister   . Bone cancer Brother     Social History Social History  Substance Use Topics  . Smoking status: Former Smoker    Quit date: 11/25/2009  . Smokeless tobacco: Never Used  . Alcohol use No     Review of Systems  Constitutional:  No fever/chills Cardiovascular: no chest pain. Respiratory: no cough. No SOB. Gastrointestinal: No abdominal pain.  No nausea, no vomiting.  No diarrhea. No constipation. Skin: Patient has resolving hydradenitis of the axilla bilaterally. Neurological: Negative for headaches, focal weakness or numbness. 10-point ROS otherwise negative.  ____________________________________________   PHYSICAL EXAM:  VITAL SIGNS: ED Triage Vitals [07/10/16 1414]  Enc Vitals Group     BP (!) 167/76     Pulse Rate 73     Resp 18     Temp 98 F (36.7 C)     Temp Source Oral     SpO2 95 %     Weight (!) 303 lb (137.4 kg)     Height      Head Circumference      Peak Flow      Pain Score 4     Pain Loc      Pain Edu?      Excl. in [place]?      Constitutional: Alert and oriented. Well appearing and in no acute distress. Cardiovascular: Normal rate, regular rhythm. Normal S1 and S2.  Good peripheral circulation. Respiratory: Normal respiratory effort without tachypnea or retractions. Lungs CTAB. Good air entry to the bases with no decreased or absent breath sounds. Neurologic:  Normal speech and language. No gross focal neurologic deficits are appreciated.  Skin: Patient has a 2.5 cm x 1 cm abscess remnant localized to the left axilla. Patient has hydradenitis in various stages of healing located at the axilla bilaterally. No regions of induration, fluctuance or surrounding cellulitis. Psychiatric: Mood and affect are normal. Speech and behavior are normal. Patient exhibits appropriate insight and judgement.   ____________________________________________   LABS (all labs ordered are listed, but only abnormal results are displayed)  Labs Reviewed - No data to display ____________________________________________  EKG   ____________________________________________  RADIOLOGY   No results found.  ____________________________________________    PROCEDURES  Procedure(s) performed:     Procedures    Medications - No data to display   ____________________________________________   INITIAL IMPRESSION / ASSESSMENT AND PLAN / ED COURSE  Pertinent labs & imaging results that were available during my care of the patient were reviewed by me and considered in my medical decision making (see chart for details).  Review of the [place] CSRS was performed in accordance of the [place] prior to dispensing any controlled drugs.  Clinical Course     Assessment and Plan:  Hydradenitis  Patient presents to the emergency department with hydradenitis suppurativa at the axilla bilaterally. Physical exam reveals a non-packable, 2.5 cm x 1 cm open abscess remnant localized to the skin over the left axilla. Patient was referred to wound care. She was advised to make an appointment tomorrow for additional monitoring and management. Physical exam findings did not reveal areas of fluctuance or palpable induration. Incision and drainage is not warranted at this time. Patient was discharged with topical clindamycin.  Patient education was provided regarding hydradenitis. Vital signs are reassuring at this time. All patient questions were answered. Strict return precautions were given.  ____________________________________________  FINAL CLINICAL IMPRESSION(S) / ED DIAGNOSES  Final diagnoses:  Hydradenitis      NEW MEDICATIONS STARTED DURING THIS VISIT:  New Prescriptions   CLINDAMYCIN (CLINDAGEL) 1 % GEL    Apply to affected area 2 times daily        This chart was dictated using voice recognition software/Dragon. Despite best efforts to proofread, errors can occur which can change the meaning. Any change was purely unintentional.    Lannie Fields, PA-C 07/10/16 2359    Harvest Dark, MD 07/11/16 [phone]

## 2016-07-10 NOTE — Discharge Instructions (Signed)
Discuss a combination of clindamycin and rifampin with PCP if hydradenitis suppurativa acutely worsens.

## 2016-07-10 NOTE — ED Triage Notes (Addendum)
Pt reports to ED w/ c/o persistent rash/cysts to bilateral axilla.  Pt sts she's seen PCP for issues and will be referred to dermatologist.   Pt sts that she "looks like a leper".  Pt does have rash to torso and bilateral axilla.  Pt has reddened area to L axilla.

## 2016-07-15 ENCOUNTER — Ambulatory Visit: Payer: Self-pay | Admitting: Surgery

## 2016-07-21 ENCOUNTER — Encounter: Payer: Self-pay | Attending: Surgery | Admitting: Surgery

## 2016-07-21 DIAGNOSIS — Z7984 Long term (current) use of oral hypoglycemic drugs: Secondary | ICD-10-CM | POA: Insufficient documentation

## 2016-07-21 DIAGNOSIS — L732 Hidradenitis suppurativa: Secondary | ICD-10-CM | POA: Insufficient documentation

## 2016-07-21 DIAGNOSIS — Z8673 Personal history of transient ischemic attack (TIA), and cerebral infarction without residual deficits: Secondary | ICD-10-CM | POA: Insufficient documentation

## 2016-07-21 DIAGNOSIS — Z79899 Other long term (current) drug therapy: Secondary | ICD-10-CM | POA: Insufficient documentation

## 2016-07-21 DIAGNOSIS — S2190XA Unspecified open wound of unspecified part of thorax, initial encounter: Secondary | ICD-10-CM | POA: Insufficient documentation

## 2016-07-21 DIAGNOSIS — I10 Essential (primary) hypertension: Secondary | ICD-10-CM | POA: Insufficient documentation

## 2016-07-21 DIAGNOSIS — X58XXXA Exposure to other specified factors, initial encounter: Secondary | ICD-10-CM | POA: Insufficient documentation

## 2016-07-21 DIAGNOSIS — J449 Chronic obstructive pulmonary disease, unspecified: Secondary | ICD-10-CM | POA: Insufficient documentation

## 2016-07-21 DIAGNOSIS — Z87891 Personal history of nicotine dependence: Secondary | ICD-10-CM | POA: Insufficient documentation

## 2016-07-21 DIAGNOSIS — E11622 Type 2 diabetes mellitus with other skin ulcer: Secondary | ICD-10-CM | POA: Insufficient documentation

## 2016-07-22 NOTE — Progress Notes (Signed)
LASHAWNDRA, JANKIEWICZ ([id number]) Visit Report for 07/21/2016 Chief Complaint Document Details Patient Name: Kristin Mora, Kristin Mora. Date of Service: 07/21/2016 9:00 AM Medical Record Number: [id number] Patient Account Number: [id number] Date of Birth/Sex: 10-27-1955 (61 y.o. Female) Treating RN: Baruch Gouty, RN, BSN, Velva Harman Primary Care Provider: Carmon Ginsberg Other Clinician: Referring Provider: Carmon Ginsberg Treating Provider/Extender: Frann Rider in Treatment: 0 Information Obtained from: Patient Chief Complaint Patient seen for complaints of Non-Healing Wound the left axillary area which she's had for at least 2 months Electronic Signature(s) Signed: 07/21/2016 10:04:28 AM By: Christin Fudge MD, FACS Entered By: Christin Fudge on 07/21/2016 10:04:28 Kristin Mora ([id number]) -------------------------------------------------------------------------------- HPI Details Patient Name: Kristin Mora Date of Service: 07/21/2016 9:00 AM Medical Record Number: [id number] Patient Account Number: [id number] Date of Birth/Sex: June 26, 1956 (61 y.o. Female) Treating RN: Baruch Gouty, RN, BSN, Velva Harman Primary Care Provider: Carmon Ginsberg Other Clinician: Referring Provider: Carmon Ginsberg Treating Provider/Extender: Frann Rider in Treatment: 0 History of Present Illness Location: after axillary area Quality: Patient reports No Pain. Severity: Patient states wound (s) are getting better. Duration: Patient has had the wound for > 2 months prior to seeking treatment at the wound center Context: The wound appeared gradually over time Modifying Factors: Other treatment(s) tried include:doxycycline and clindamycin gel Associated Signs and Symptoms: Patient reports having foul odor. HPI Description: 61 year old patient who has a past medical history significant for diabetes mellitus, essential hypertension, COPD, CVA, axillary hidradenitis repetitive, vertigo, recently presented to the emergency  department with hydradenitis suppurtiva, which is normally managed by her PCP and currently takes doxycycline. she is a former smoker and quit in 2011. ER advised her clindamycin gel locally and to follow-up with the wound center. asked hemoglobin A1c done 3 weeks ago was 6.5% Electronic Signature(s) Signed: 07/21/2016 10:05:14 AM By: Christin Fudge MD, FACS Previous Signature: 07/21/2016 9:23:17 AM Version By: Christin Fudge MD, FACS Entered By: Christin Fudge on 07/21/2016 10:05:14 Kristin Mora ([id number]) -------------------------------------------------------------------------------- Physical Exam Details Patient Name: Kristin Mora Date of Service: 07/21/2016 9:00 AM Medical Record Number: [id number] Patient Account Number: [id number] Date of Birth/Sex: 08/12/1955 (61 y.o. Female) Treating RN: Baruch Gouty, RN, BSN, Velva Harman Primary Care Provider: Carmon Ginsberg Other Clinician: Referring Provider: Carmon Ginsberg Treating Provider/Extender: Frann Rider in Treatment: 0 Constitutional . Pulse regular. Respirations normal and unlabored. Afebrile. . Eyes Nonicteric. Reactive to light. Ears, Nose, Mouth, and Throat Lips, teeth, and gums WNL.Marland Kitchen Moist mucosa without lesions. Neck supple and nontender. No palpable supraclavicular or cervical adenopathy. Normal sized without goiter. Respiratory WNL. No retractions.. Cardiovascular Pedal Pulses WNL. No clubbing, cyanosis or edema. Gastrointestinal (GI) Abdomen without masses or tenderness.. No liver or spleen enlargement or tenderness.. Lymphatic No adneopathy. No adenopathy. No adenopathy. Musculoskeletal Adexa without tenderness or enlargement.. Digits and nails w/o clubbing, cyanosis, infection, petechiae, ischemia, or inflammatory conditions.. Integumentary (Hair, Skin) No suspicious lesions. No crepitus or fluctuance. No peri-wound warmth or erythema. No masses.Marland Kitchen Psychiatric Judgement and insight Intact.. No evidence of  depression, anxiety, or agitation.. Notes the left axillary area has stigmata of hidradenitis suppuritiva and there is an open ulcerated area with clean granulation tissue which does not need any sharp debridement Electronic Signature(s) Signed: 07/21/2016 10:06:10 AM By: Christin Fudge MD, FACS Entered By: Christin Fudge on 07/21/2016 10:06:10 Kristin Mora ([id number]) -------------------------------------------------------------------------------- Physician Orders Details Patient Name: Kristin Mora. Date of Service: 07/21/2016 9:00 AM Medical Record Number: [id number] Patient Account Number: [id number] Date of Birth/Sex: 1956-02-17 (61 y.o. Female) Treating RN: Cornell Barman Primary  Care Provider: Carmon Ginsberg Other Clinician: Referring Provider: Carmon Ginsberg Treating Provider/Extender: Frann Rider in Treatment: 0 Verbal / Phone Orders: No Diagnosis Coding Wound Cleansing o Clean wound with Normal Saline. o Cleanse wound with mild soap and water Primary Wound Dressing Wound #1 Left Axilla o Aquacel Ag Secondary Dressing Wound #1 Left Axilla o Other - coverlet Dressing Change Frequency Wound #1 Left Axilla o Change dressing every other day. Follow-up Appointments Wound #1 Left Axilla o Return Appointment in 1 week. Electronic Signature(s) Signed: 07/21/2016 3:30:03 PM By: Christin Fudge MD, FACS Signed: 07/21/2016 3:30:03 PM By: Gretta Cool RN, BSN, Kim RN, BSN Entered By: Gretta Cool, RN, BSN, Kim on 07/21/2016 09:50:29 Kristin Mora ([id number]) -------------------------------------------------------------------------------- Problem List Details Patient Name: Kristin Mora, Kristin Mora. Date of Service: 07/21/2016 9:00 AM Medical Record Number: [id number] Patient Account Number: [id number] Date of Birth/Sex: 12-03-1955 (61 y.o. Female) Treating RN: Baruch Gouty, RN, BSN, Velva Harman Primary Care Provider: Carmon Ginsberg Other Clinician: Referring Provider: Carmon Ginsberg Treating Provider/Extender: Frann Rider in Treatment: 0 Active Problems ICD-10 Encounter Code Description Active Date Diagnosis E11.622 Type 2 diabetes mellitus with other skin ulcer 07/21/2016 Yes L73.2 Hidradenitis suppurativa 07/21/2016 Yes S21.90XA Unspecified open wound of unspecified part of thorax, 07/21/2016 Yes initial encounter Inactive Problems Resolved Problems Electronic Signature(s) Signed: 07/21/2016 10:04:02 AM By: Christin Fudge MD, FACS Entered By: Christin Fudge on 07/21/2016 10:04:02 Kristin Mora ([id number]) -------------------------------------------------------------------------------- Progress Note Details Patient Name: Kristin Mora Date of Service: 07/21/2016 9:00 AM Medical Record Number: [id number] Patient Account Number: [id number] Date of Birth/Sex: [birth year] (61 y.o. Female) Treating RN: Afful, RN, BSN, Velva Harman Primary Care Provider: Carmon Ginsberg Other Clinician: Referring Provider: Carmon Ginsberg Treating Provider/Extender: Frann Rider in Treatment: 0 Subjective Chief Complaint Information obtained from Patient Patient seen for complaints of Non-Healing Wound the left axillary area which she's had for at least 2 months History of Present Illness (HPI) The following HPI elements were documented for the patient's wound: Location: after axillary area Quality: Patient reports No Pain. Severity: Patient states wound (s) are getting better. Duration: Patient has had the wound for > 2 months prior to seeking treatment at the wound center Context: The wound appeared gradually over time Modifying Factors: Other treatment(s) tried include:doxycycline and clindamycin gel Associated Signs and Symptoms: Patient reports having foul odor. 61 year old patient who has a past medical history significant for diabetes mellitus, essential hypertension, COPD, CVA, axillary hidradenitis repetitive, vertigo, recently presented to the  emergency department with hydradenitis suppurtiva, which is normally managed by her PCP and currently takes doxycycline. she is a former smoker and quit in 2011. ER advised her clindamycin gel locally and to follow-up with the wound center. asked hemoglobin A1c done 3 weeks ago was 6.5% Wound History Patient presents with 1 open wound that has been present for approximately 2 months. Patient has been treating wound in the following manner: clindamycin gel. Laboratory tests have not been performed in the last month. Patient reportedly has not tested positive for an antibiotic resistant organism. Patient reportedly has not tested positive for osteomyelitis. Patient reportedly has not had testing performed to evaluate circulation in the legs. Patient History Information obtained from Patient, Chart. Allergies latex, lisinopril Family History Kristin Mora, Kristin Mora ([id number]) Cancer - Siblings, Father, Diabetes - Siblings, Heart Disease - Mother, Lung Disease, Stroke - Siblings. Social History Former smoker, Marital Status - Single, Alcohol Use - Never, Drug Use - No History, Caffeine Use - Daily. Medical History Eyes Denies history of Cataracts, Glaucoma, Optic  Neuritis Ear/Nose/Mouth/Throat Denies history of Chronic sinus problems/congestion, Middle ear problems Hematologic/Lymphatic Denies history of Anemia, Hemophilia, Human Immunodeficiency Virus, Lymphedema, Sickle Cell Disease Respiratory Denies history of Aspiration, Asthma, Chronic Obstructive Pulmonary Disease (COPD), Pneumothorax, Sleep Apnea, Tuberculosis Cardiovascular Patient has history of Hypertension Denies history of Angina, Arrhythmia, Congestive Heart Failure, Coronary Artery Disease, Deep Vein Thrombosis, Hypotension, Myocardial Infarction, Peripheral Arterial Disease, Peripheral Venous Disease, Vasculitis Gastrointestinal Denies history of Cirrhosis , Colitis, Crohn s, Hepatitis A, Hepatitis B, Hepatitis  C Endocrine Patient has history of Type II Diabetes Denies history of Type I Diabetes Genitourinary Denies history of End Stage Renal Disease Immunological Denies history of Lupus Erythematosus, Raynaud s, Scleroderma Integumentary (Skin) Denies history of History of Burn, History of pressure wounds Musculoskeletal Denies history of Gout, Rheumatoid Arthritis, Osteoarthritis, Osteomyelitis Neurologic Denies history of Dementia, Neuropathy, Quadriplegia, Paraplegia, Seizure Disorder Oncologic Denies history of Received Chemotherapy, Received Radiation Psychiatric Denies history of Anorexia/bulimia, Confinement Anxiety Patient is treated with Oral Agents. Blood sugar is not tested. Hospitalization/Surgery History - 04/28/2007, Craniotomy. - Knee Surgery. - Tonsillectomy and adenoidectomy. - Tubal Ligation. Medical And Surgical History Notes Constitutional Symptoms (General Health) Hydradenitis suppurativa; Stroke, HTN, Asthma, Brain Aneurysm, HDL, vertigo Neurologic Stroke Kristin Mora, Kristin Mora ([id number]) Review of Systems (ROS) Constitutional Symptoms (General Health) The patient has no complaints or symptoms. Eyes Complains or has symptoms of Glasses / Contacts. Ear/Nose/Mouth/Throat The patient has no complaints or symptoms. Hematologic/Lymphatic The patient has no complaints or symptoms. Respiratory The patient has no complaints or symptoms. Cardiovascular Complains or has symptoms of LE edema - ankles swee sometimes. Denies complaints or symptoms of Chest pain. Gastrointestinal The patient has no complaints or symptoms. Endocrine Denies complaints or symptoms of Hepatitis, Thyroid disease, Polydypsia (Excessive Thirst). Genitourinary The patient has no complaints or symptoms. Immunological Complains or has symptoms of Hives, Itching. Integumentary (Skin) Complains or has symptoms of Wounds. Denies complaints or symptoms of Bleeding or bruising tendency, Breakdown,  Swelling. Musculoskeletal The patient has no complaints or symptoms. Neurologic The patient has no complaints or symptoms. Oncologic The patient has no complaints or symptoms. Psychiatric The patient has no complaints or symptoms. Medications diazepam 5 mg tablet oral tablet oral gabapentin 600 mg tablet oral tablet oral meclizine 12.5 mg tablet oral tablet oral metformin 500 mg tablet oral tablet oral atorvastatin 40 mg tablet oral tablet oral losartan 100 mg tablet oral tablet oral amlodipine 5 mg tablet oral tablet oral Bleph-10 10 % eye drops ophthalmic (eye) drops ophthalmic (eye) clopidogrel 75 mg tablet oral tablet oral doxycycline monohydrate 100 mg tablet oral tablet oral Clindagel 1 % topical topical gel topical Kristin Mora, Kristin Mora ([id number]) Objective Constitutional Pulse regular. Respirations normal and unlabored. Afebrile. Vitals Time Taken: 9:15 AM, Height: 62 in, Source: Stated, Weight: 296 lbs, Source: Stated, BMI: 54.1, Temperature: 98.1 F, Pulse: 77 bpm, Respiratory Rate: 16 breaths/min, Blood Pressure: 154/96 mmHg. Eyes Nonicteric. Reactive to light. Ears, Nose, Mouth, and Throat Lips, teeth, and gums WNL.Marland Kitchen Moist mucosa without lesions. Neck supple and nontender. No palpable supraclavicular or cervical adenopathy. Normal sized without goiter. Respiratory WNL. No retractions.. Cardiovascular Pedal Pulses WNL. No clubbing, cyanosis or edema. Gastrointestinal (GI) Abdomen without masses or tenderness.. No liver or spleen enlargement or tenderness.. Lymphatic No adneopathy. No adenopathy. No adenopathy. Musculoskeletal Adexa without tenderness or enlargement.. Digits and nails w/o clubbing, cyanosis, infection, petechiae, ischemia, or inflammatory conditions.Marland Kitchen Psychiatric Judgement and insight Intact.. No evidence of depression, anxiety, or agitation.. General Notes: the left axillary area has stigmata of hidradenitis  suppuritiva and there is an open  ulcerated area with clean granulation tissue which does not need any sharp debridement Integumentary (Hair, Skin) No suspicious lesions. No crepitus or fluctuance. No peri-wound warmth or erythema. No masses.. Wound #1 status is Open. Original cause of wound was Gradually Appeared. The wound is located on the Left Axilla. The wound measures 1cm length x 2cm width x 0.1cm depth; 1.571cm^2 area and 0.157cm^3 volume. The wound is limited to skin breakdown. There is no tunneling or undermining noted. There is a Kristin Mora, Kristin Mora ([id number]) medium amount of serous drainage noted. The wound margin is distinct with the outline attached to the wound base. There is large (67-100%) pink granulation within the wound bed. There is no necrotic tissue within the wound bed. The periwound skin appearance exhibited: Scarring. The periwound skin appearance did not exhibit: Callus, Crepitus, Excoriation, Induration, Rash, Dry/Scaly, Maceration, Atrophie Blanche, Cyanosis, Ecchymosis, Hemosiderin Staining, Mottled, Pallor, Rubor, Erythema. Periwound temperature was noted as No Abnormality. The periwound has tenderness on palpation. Assessment Active Problems ICD-10 E11.622 - Type 2 diabetes mellitus with other skin ulcer L73.2 - Hidradenitis suppurativa S21.90XA - Unspecified open wound of unspecified part of thorax, initial encounter this 61 year old diabetic who is fairly well controlled with the last hemoglobin A1c being 6.5 has signs and symptoms of hidradenitis suppurativa especially rampant both axillary areas. An abscess which was previously drained and now has a shallow ulcer with clean granulation tissue. Recommended; 1. Silver alginate with a bordered foam to be applied every other day. 2. good control of her diabetes mellitus 3. Follow-up with her dermatology appointment 4. Review by me next week Plan Wound Cleansing: Clean wound with Normal Saline. Cleanse wound with mild soap and  water Primary Wound Dressing: Wound #1 Left Axilla: Aquacel Ag Secondary Dressing: Wound #1 Left Axilla: Other - coverlet Dressing Change Frequency: Wound #1 Left Axilla: Change dressing every other day. Follow-up Appointments: Wound #1 Left Axilla: Return Appointment in 1 week. Kristin Mora, Kristin Mora ([id number]) this 61 year old diabetic who is fairly well controlled with the last hemoglobin A1c being 6.5 has signs and symptoms of hidradenitis suppurativa especially rampant both axillary areas. An abscess which was previously drained and now has a shallow ulcer with clean granulation tissue. Recommended; 1. Silver alginate with a bordered foam to be applied every other day. 2. good control of her diabetes mellitus 3. Follow-up with her dermatology appointment 4. Review by me next week Electronic Signature(s) Signed: 07/21/2016 10:07:53 AM By: Christin Fudge MD, FACS Entered By: Christin Fudge on 07/21/2016 10:07:53 Kristin Mora ([id number]) -------------------------------------------------------------------------------- ROS/PFSH Details Patient Name: Kristin Mora Date of Service: 07/21/2016 9:00 AM Medical Record Number: [id number] Patient Account Number: [id number] Date of Birth/Sex: [birth year] (61 y.o. Female) Treating RN: Cornell Barman Primary Care Provider: Carmon Ginsberg Other Clinician: Referring Provider: Carmon Ginsberg Treating Provider/Extender: Frann Rider in Treatment: 0 Information Obtained From Patient Chart Wound History Do you currently have one or more open woundso Yes How many open wounds do you currently haveo 1 Approximately how long have you had your woundso 2 months How have you been treating your wound(s) until nowo clindamycin gel Has your wound(s) ever healed and then re-openedo No Have you had any lab work done in the past montho No Have you tested positive for an antibiotic resistant organism (MRSA, VRE)o No Have you tested positive for  osteomyelitis (bone infection)o No Have you had any tests for circulation on your legso No Eyes Complaints and Symptoms: Positive for: Glasses /  Contacts Medical History: Negative for: Cataracts; Glaucoma; Optic Neuritis Cardiovascular Complaints and Symptoms: Positive for: LE edema - ankles swee sometimes Negative for: Chest pain Medical History: Positive for: Hypertension Negative for: Angina; Arrhythmia; Congestive Heart Failure; Coronary Artery Disease; Deep Vein Thrombosis; Hypotension; Myocardial Infarction; Peripheral Arterial Disease; Peripheral Venous Disease; Vasculitis Endocrine Complaints and Symptoms: Negative for: Hepatitis; Thyroid disease; Polydypsia (Excessive Thirst) Medical History: Positive for: Type II Diabetes Negative for: Type I Diabetes Kristin Mora, Kristin Mora ([id number]) Time with diabetes: 1 year Treated with: Oral agents Blood sugar tested every day: No Immunological Complaints and Symptoms: Positive for: Hives; Itching Medical History: Negative for: Lupus Erythematosus; Raynaudos; Scleroderma Integumentary (Skin) Complaints and Symptoms: Positive for: Wounds Negative for: Bleeding or bruising tendency; Breakdown; Swelling Medical History: Negative for: History of Burn; History of pressure wounds Constitutional Symptoms (General Health) Complaints and Symptoms: No Complaints or Symptoms Medical History: Past Medical History Notes: Hydradenitis suppurativa; Stroke, HTN, Asthma, Brain Aneurysm, HDL, vertigo Ear/Nose/Mouth/Throat Complaints and Symptoms: No Complaints or Symptoms Medical History: Negative for: Chronic sinus problems/congestion; Middle ear problems Hematologic/Lymphatic Complaints and Symptoms: No Complaints or Symptoms Medical History: Negative for: Anemia; Hemophilia; Human Immunodeficiency Virus; Lymphedema; Sickle Cell Disease Respiratory Complaints and Symptoms: No Complaints or Symptoms Medical History: Kristin Mora, Kristin Mora ([id number]) Negative for: Aspiration; Asthma; Chronic Obstructive Pulmonary Disease (COPD); Pneumothorax; Sleep Apnea; Tuberculosis Gastrointestinal Complaints and Symptoms: No Complaints or Symptoms Medical History: Negative for: Cirrhosis ; Colitis; Crohnos; Hepatitis A; Hepatitis B; Hepatitis C Genitourinary Complaints and Symptoms: No Complaints or Symptoms Medical History: Negative for: End Stage Renal Disease Musculoskeletal Complaints and Symptoms: No Complaints or Symptoms Medical History: Negative for: Gout; Rheumatoid Arthritis; Osteoarthritis; Osteomyelitis Neurologic Complaints and Symptoms: No Complaints or Symptoms Medical History: Negative for: Dementia; Neuropathy; Quadriplegia; Paraplegia; Seizure Disorder Past Medical History Notes: Stroke Oncologic Complaints and Symptoms: No Complaints or Symptoms Medical History: Negative for: Received Chemotherapy; Received Radiation Psychiatric Complaints and Symptoms: No Complaints or Symptoms Medical History: Kristin Mora, Kristin Mora ([id number]) Negative for: Anorexia/bulimia; Confinement Anxiety Immunizations Pneumococcal Vaccine: Received Pneumococcal Vaccination: No Hospitalization / Surgery History Name of Hospital Purpose of Hospitalization/Surgery Date Craniotomy 04/28/2007 Knee Surgery Tonsillectomy and adenoidectomy Tubal Ligation Family and Social History Cancer: Yes - Siblings, Father; Diabetes: Yes - Siblings; Heart Disease: Yes - Mother; Lung Disease: Yes; Stroke: Yes - Siblings; Former smoker; Marital Status - Single; Alcohol Use: Never; Drug Use: No History; Caffeine Use: Daily; Advanced Directives: No; Patient does not want information on Advanced Directives; Do not resuscitate: No; Living Will: No; Medical Power of Attorney: No Physician Affirmation I have reviewed and agree with the above information. Electronic Signature(s) Signed: 07/21/2016 3:30:03 PM By: Christin Fudge MD, FACS Signed:  07/21/2016 3:30:03 PM By: Gretta Cool RN, BSN, Kim RN, BSN Entered By: Christin Fudge on 07/21/2016 10:01:07 Kristin Mora ([id number]) -------------------------------------------------------------------------------- [place] Details Patient Name: Kristin Mora Date of Service: 07/21/2016 Medical Record Number: [id number] Patient Account Number: [id number] Date of Birth/Sex: 1956-02-04 (61 y.o. Female) Treating RN: Baruch Gouty, RN, BSN, Velva Harman Primary Care Provider: Carmon Ginsberg Other Clinician: Referring Provider: Carmon Ginsberg Treating Provider/Extender: Frann Rider in Treatment: 0 Diagnosis Coding ICD-10 Codes Code Description [phone] Type 2 diabetes mellitus with other skin ulcer L73.2 Hidradenitis suppurativa S21.90XA Unspecified open wound of unspecified part of thorax, initial encounter Facility Procedures CPT4 Code: [id number] Description: 99213 - WOUND CARE VISIT-LEV 3 EST PT Modifier: Quantity: 1 Physician Procedures CPT4 Code Description: [id number] - WC PHYS LEVEL 4 - NEW PT ICD-10 Description Diagnosis L73.2 Hidradenitis suppurativa  E11.622 Type 2 diabetes mellitus with other skin ulcer S21.90XA Unspecified open wound of unspecified part of thor Modifier: ax, initial en Quantity: 1 counter Electronic Signature(s) Signed: 07/21/2016 10:08:04 AM By: Christin Fudge MD, FACS Entered By: Christin Fudge on 07/21/2016 10:08:04

## 2016-07-22 NOTE — Progress Notes (Signed)
Kristin Mora, Kristin Mora ([id number]) Visit Report for 07/21/2016 Abuse/Suicide Risk Screen Details Patient Name: Kristin Mora, Kristin Mora. Date of Service: 07/21/2016 9:00 AM Medical Record Number: [id number] Patient Account Number: [id number] Date of Birth/Sex: 1955-10-12 (61 y.o. Female) Treating RN: Cornell Barman Primary Care Pratham Cassatt: Carmon Ginsberg Other Clinician: Referring Maxxon Schwanke: Carmon Ginsberg Treating Cowen Pesqueira/Extender: Frann Rider in Treatment: 0 Abuse/Suicide Risk Screen Items Answer ABUSE/SUICIDE RISK SCREEN: Has anyone close to you tried to hurt or harm you recentlyo No Do you feel uncomfortable with anyone in your familyo No Has anyone forced you do things that you didnot want to doo No Do you have any thoughts of harming yourselfo No Patient displays signs or symptoms of abuse and/or neglect. No Electronic Signature(s) Signed: 07/21/2016 3:30:03 PM By: Gretta Cool, RN, BSN, Kim RN, BSN Entered By: Gretta Cool, RN, BSN, Kim on 07/21/2016 09:24:36 Kristin Mora ([id number]) -------------------------------------------------------------------------------- Activities of Daily Living Details Patient Name: Kristin Mora, Kristin Mora. Date of Service: 07/21/2016 9:00 AM Medical Record Number: [id number] Patient Account Number: [id number] Date of Birth/Sex: 1956/04/16 (61 y.o. Female) Treating RN: Cornell Barman Primary Care Jaykub Mackins: Carmon Ginsberg Other Clinician: Referring Ladarryl Wrage: Carmon Ginsberg Treating Rickita Forstner/Extender: Frann Rider in Treatment: 0 Activities of Daily Living Items Answer Activities of Daily Living (Please select one for each item) Drive Automobile Need Assistance Take Medications Completely Able Use Telephone Completely Able Care for Appearance Completely Able Use Toilet Completely Able Bath / Shower Completely Able Dress Self Completely Able Feed Self Completely Able Walk Completely Able Get In / Out Bed Completely Able Housework Completely Able Prepare Meals  Completely Able Handle Money Completely Able Shop for Self Completely Able Electronic Signature(s) Signed: 07/21/2016 3:30:03 PM By: Gretta Cool, RN, BSN, Kim RN, BSN Entered By: Gretta Cool, RN, BSN, Kim on 07/21/2016 09:25:01 Kristin Mora ([id number]) -------------------------------------------------------------------------------- Education Assessment Details Patient Name: Kristin Mora Date of Service: 07/21/2016 9:00 AM Medical Record Number: [id number] Patient Account Number: [id number] Date of Birth/Sex: 10-Sep-1955 (61 y.o. Female) Treating RN: Cornell Barman Primary Care Arora Coakley: Carmon Ginsberg Other Clinician: Referring Zlata Alcaide: Carmon Ginsberg Treating Kysha Muralles/Extender: Frann Rider in Treatment: 0 Primary Learner Assessed: Patient Learning Preferences/Education Level/Primary Language Learning Preference: Explanation, Demonstration, Printed Material Highest Education Level: High School Preferred Language: English Cognitive Barrier Assessment/Beliefs Language Barrier: No Translator Needed: No Memory Deficit: No Emotional Barrier: No Cultural/Religious Beliefs Affecting Medical No Care: Physical Barrier Assessment Impaired Vision: Yes Glasses Impaired Hearing: No Decreased Hand dexterity: No Knowledge/Comprehension Assessment Knowledge Level: Low Comprehension Level: Low Ability to understand written Low instructions: Ability to understand verbal Low instructions: Motivation Assessment Anxiety Level: Calm Cooperation: Cooperative Education Importance: Acknowledges Need Interest in Health Problems: Asks Questions Perception: Coherent Willingness to Engage in Self- Medium Management Activities: Readiness to Engage in Self- Medium Management Activities: Electronic Signature(s) Kristin Mora, Kristin Mora ([id number]) Signed: 07/21/2016 3:30:03 PM By: Gretta Cool, RN, BSN, Kim RN, BSN Entered By: Gretta Cool, RN, BSN, Kim on 07/21/2016 09:25:38 Kristin Mora  ([id number]) -------------------------------------------------------------------------------- Fall Risk Assessment Details Patient Name: Kristin Mora. Date of Service: 07/21/2016 9:00 AM Medical Record Number: [id number] Patient Account Number: [id number] Date of Birth/Sex: 11-Aug-1955 (61 y.o. Female) Treating RN: Cornell Barman Primary Care Aulton Routt: Carmon Ginsberg Other Clinician: Referring Kaoru Benda: Carmon Ginsberg Treating Jowana Thumma/Extender: Frann Rider in Treatment: 0 Fall Risk Assessment Items Have you had 2 or more falls in the last 12 monthso 0 No Have you had any fall that resulted in injury in the last 12 monthso 0 No FALL RISK ASSESSMENT: History of falling - immediate  or within 3 months 0 No Secondary diagnosis 0 No Ambulatory aid None/bed rest/wheelchair/nurse 0 Yes Crutches/cane/walker 0 No Furniture 0 No IV Access/Saline Lock 0 No Gait/Training Normal/bed rest/immobile 0 Yes Weak 0 No Impaired 0 No Mental Status Oriented to own ability 0 Yes Electronic Signature(s) Signed: 07/21/2016 3:30:03 PM By: Gretta Cool, RN, BSN, Kim RN, BSN Entered By: Gretta Cool, RN, BSN, Kim on 07/21/2016 09:25:55 Kristin Mora ([id number]) -------------------------------------------------------------------------------- Nutrition Risk Assessment Details Patient Name: Kristin Mora Date of Service: 07/21/2016 9:00 AM Medical Record Number: [id number] Patient Account Number: [id number] Date of Birth/Sex: 1955/12/02 (62 y.o. Female) Treating RN: Cornell Barman Primary Care Zeffie Bickert: Carmon Ginsberg Other Clinician: Referring Ngoc Detjen: Carmon Ginsberg Treating Donata Reddick/Extender: Frann Rider in Treatment: 0 Height (in): 62 Weight (lbs): 296 Body Mass Index (BMI): 54.1 Nutrition Risk Assessment Items NUTRITION RISK SCREEN: I have an illness or condition that made me change the kind and/or 0 No amount of food I eat I eat fewer than two meals per day 0 No I eat few fruits and  vegetables, or milk products 0 No I have three or more drinks of beer, liquor or wine almost every day 0 No I have tooth or mouth problems that make it hard for me to eat 0 No I don't always have enough money to buy the food I need 0 No I eat alone most of the time 1 Yes I take three or more different prescribed or over-the-counter drugs a 1 Yes day Without wanting to, I have lost or gained 10 pounds in the last six 0 No months I am not always physically able to shop, cook and/or feed myself 0 No Nutrition Protocols Good Risk Protocol 0 No interventions needed Moderate Risk Protocol Electronic Signature(s) Signed: 07/21/2016 3:30:03 PM By: Gretta Cool, RN, BSN, Kim RN, BSN Entered By: Gretta Cool, RN, BSN, Kim on 07/21/2016 [id number]

## 2016-07-22 NOTE — Progress Notes (Signed)
HONESTY, SCHWALLIE ([id number]) Visit Report for 07/21/2016 Allergy List Details Patient Name: Kristin Mora, Kristin Mora. Date of Service: 07/21/2016 9:00 AM Medical Record Number: [id number] Patient Account Number: [id number] Date of Birth/Sex: 1956/03/03 (61 y.o. Female) Treating RN: Cornell Barman Primary Care Euclid Cassetta: Carmon Ginsberg Other Clinician: Referring Winthrop Shannahan: Carmon Ginsberg Treating Byrne Capek/Extender: Frann Rider in Treatment: 0 Allergies Active Allergies latex lisinopril Allergy Notes Electronic Signature(s) Signed: 07/21/2016 3:30:03 PM By: Gretta Cool, RN, BSN, Kim RN, BSN Entered By: Gretta Cool, RN, BSN, Kim on 07/21/2016 09:57:37 Kristin Mora ([id number]) -------------------------------------------------------------------------------- [place] Information Details Patient Name: Kristin Mora Date of Service: 07/21/2016 9:00 AM Medical Record Number: [id number] Patient Account Number: [id number] Date of Birth/Sex: 14-Mar-1956 (61 y.o. Female) Treating RN: Cornell Barman Primary Care Guss Farruggia: Carmon Ginsberg Other Clinician: Referring Josten Warmuth: Carmon Ginsberg Treating Kirby Argueta/Extender: Frann Rider in Treatment: 0 Visit Information Patient Arrived: Ambulatory Arrival Time: 09:13 Accompanied By: daughter Transfer Assistance: Manual Patient Identification Verified: Yes Secondary Verification Process Yes Completed: Patient Requires Transmission- No Based Precautions: Patient Has Alerts: Yes Patient Alerts: Type II Diabetic Electronic Signature(s) Signed: 07/21/2016 3:30:03 PM By: Gretta Cool, RN, BSN, Kim RN, BSN Entered By: Gretta Cool, RN, BSN, Kim on 07/21/2016 09:16:49 Kristin Mora ([id number]) -------------------------------------------------------------------------------- Clinic Level of Care Assessment Details Patient Name: Kristin Mora. Date of Service: 07/21/2016 9:00 AM Medical Record Number: [id number] Patient Account Number: [id number] Date of Birth/Sex:  26-Dec-1955 (61 y.o. Female) Treating RN: Cornell Barman Primary Care Dayden Viverette: Carmon Ginsberg Other Clinician: Referring Saron Tweed: Carmon Ginsberg Treating Chanler Schreiter/Extender: Frann Rider in Treatment: 0 Clinic Level of Care Assessment Items TOOL 2 Quantity Score []  - Use when only an EandM is performed on the INITIAL visit 0 ASSESSMENTS - Nursing Assessment / Reassessment X - General Physical Exam (combine w/ comprehensive assessment (listed just 1 20 below) when performed on new pt. evals) X - Comprehensive Assessment (HX, ROS, Risk Assessments, Wounds Hx, etc.) 1 25 ASSESSMENTS - Wound and Skin Assessment / Reassessment X - Simple Wound Assessment / Reassessment - one wound 1 5 []  - Complex Wound Assessment / Reassessment - multiple wounds 0 []  - Dermatologic / Skin Assessment (not related to wound area) 0 ASSESSMENTS - Ostomy and/or Continence Assessment and Care []  - Incontinence Assessment and Management 0 []  - Ostomy Care Assessment and Management (repouching, etc.) 0 PROCESS - Coordination of Care X - Simple Patient / Family Education for ongoing care 1 15 []  - Complex (extensive) Patient / Family Education for ongoing care 0 X - Staff obtains Programmer, systems, Records, Test Results / Process Orders 1 10 []  - Staff telephones HHA, Nursing Homes / Clarify orders / etc 0 []  - Routine Transfer to another Facility (non-emergent condition) 0 []  - Routine Hospital Admission (non-emergent condition) 0 []  - New Admissions / Biomedical engineer / Ordering NPWT, Apligraf, etc. 0 []  - Emergency Hospital Admission (emergent condition) 0 X - Simple Discharge Coordination 1 10 Kristin Mora, Kristin Mora ([id number]) []  - Complex (extensive) Discharge Coordination 0 PROCESS - Special Needs []  - Pediatric / Minor Patient Management 0 []  - Isolation Patient Management 0 []  - Hearing / Language / Visual special needs 0 []  - Assessment of Community assistance (transportation, D/C planning, etc.)  0 []  - Additional assistance / Altered mentation 0 []  - Support Surface(s) Assessment (bed, cushion, seat, etc.) 0 INTERVENTIONS - Wound Cleansing / Measurement X - Wound Imaging (photographs - any number of wounds) 1 5 []  - Wound Tracing (instead of photographs) 0 X - Simple Wound Measurement -  one wound 1 5 []  - Complex Wound Measurement - multiple wounds 0 X - Simple Wound Cleansing - one wound 1 5 []  - Complex Wound Cleansing - multiple wounds 0 INTERVENTIONS - Wound Dressings X - Small Wound Dressing one or multiple wounds 1 10 []  - Medium Wound Dressing one or multiple wounds 0 []  - Large Wound Dressing one or multiple wounds 0 []  - Application of Medications - injection 0 INTERVENTIONS - Miscellaneous []  - External ear exam 0 []  - Specimen Collection (cultures, biopsies, blood, body fluids, etc.) 0 []  - Specimen(s) / Culture(s) sent or taken to Lab for analysis 0 []  - Patient Transfer (multiple staff / Harrel Lemon Lift / Similar devices) 0 []  - Simple Staple / Suture removal (25 or less) 0 []  - Complex Staple / Suture removal (26 or more) 0 Kristin Mora, Kristin Mora ([id number]) []  - Hypo / Hyperglycemic Management (close monitor of Blood Glucose) 0 []  - Ankle / Brachial Index (ABI) - do not check if billed separately 0 Has the patient been seen at the hospital within the last three years: Yes Total Score: 110 Level Of Care: New/Established - Level 3 Electronic Signature(s) Signed: 07/21/2016 3:30:03 PM By: Gretta Cool, RN, BSN, Kim RN, BSN Entered By: Gretta Cool, RN, BSN, Kim on 07/21/2016 09:51:06 Kristin Mora ([id number]) -------------------------------------------------------------------------------- Encounter Discharge Information Details Patient Name: Kristin Mora Date of Service: 07/21/2016 9:00 AM Medical Record Number: [id number] Patient Account Number: [id number] Date of Birth/Sex: 03-16-1956 (61 y.o. Female) Treating RN: Baruch Gouty, RN, BSN, Velva Harman Primary Care Keiron Iodice: Carmon Ginsberg Other Clinician: Referring Brycelynn Stampley: Carmon Ginsberg Treating Raegan Winders/Extender: Frann Rider in Treatment: 0 Encounter Discharge Information Items Discharge Pain Level: 0 Discharge Condition: Stable Ambulatory Status: Ambulatory Discharge Destination: Home Transportation: Private Auto Accompanied By: daughter Schedule Follow-up Appointment: Yes Medication Reconciliation completed and provided to Patient/Care Yes  Salah: Provided on Clinical Summary of Care: 07/21/2016 Form Type Recipient Paper Patient JP Electronic Signature(s) Signed: 07/21/2016 3:30:03 PM By: Gretta Cool RN, BSN, Kim RN, BSN Previous Signature: 07/21/2016 9:50:49 AM Version By: Ruthine Dose Entered By: Gretta Cool RN, BSN, Kim on 07/21/2016 09:52:10 Kristin Mora ([id number]) -------------------------------------------------------------------------------- Lower Extremity Assessment Details Patient Name: Kristin Mora Date of Service: 07/21/2016 9:00 AM Medical Record Number: [id number] Patient Account Number: [id number] Date of Birth/Sex: Feb 24, 1956 (61 y.o. Female) Treating RN: Cornell Barman Primary Care Waylynn Benefiel: Carmon Ginsberg Other Clinician: Referring Verleen Stuckey: Carmon Ginsberg Treating Electra Paladino/Extender: Frann Rider in Treatment: 0 Electronic Signature(s) Signed: 07/21/2016 3:30:03 PM By: Gretta Cool RN, BSN, Kim RN, BSN Entered By: Gretta Cool, RN, BSN, Kim on 07/21/2016 09:26:37 Kristin Mora ([id number]) -------------------------------------------------------------------------------- Multi Wound Chart Details Patient Name: Kristin Mora Date of Service: 07/21/2016 9:00 AM Medical Record Number: [id number] Patient Account Number: [id number] Date of Birth/Sex: 12-16-1955 (61 y.o. Female) Treating RN: Cornell Barman Primary Care Nellie Pester: Carmon Ginsberg Other Clinician: Referring Ellenore Roscoe: Carmon Ginsberg Treating Jennipher Weatherholtz/Extender: Frann Rider in Treatment: 0 Vital  Signs Height(in): 62 Pulse(bpm): 77 Weight(lbs): 296 Blood Pressure 154/96 (mmHg): Body Mass Index(BMI): 54 Temperature(F): 98.1 Respiratory Rate 16 (breaths/min): Photos: [N/A:N/A] Wound Location: Left Axilla N/A N/A Wounding Event: Gradually Appeared N/A N/A Primary Etiology: Abscess N/A N/A Comorbid History: Hypertension, Type II N/A N/A Diabetes Date Acquired: 05/23/2016 N/A N/A Weeks of Treatment: 0 N/A N/A Wound Status: Open N/A N/A Measurements L x W x D 1x2x0.1 N/A N/A (cm) Area (cm) : 1.571 N/A N/A Volume (cm) : 0.157 N/A N/A Classification: Partial Thickness N/A N/A Exudate Amount: Medium N/A N/A Exudate Type: Serous  N/A N/A Exudate Color: amber N/A N/A Wound Margin: Distinct, outline attached N/A N/A Granulation Amount: Large (67-100%) N/A N/A Granulation Quality: Pink N/A N/A Necrotic Amount: None Present (0%) N/A N/A Exposed Structures: Fascia: No N/A N/A Fat Layer (Subcutaneous Tissue) Exposed: No Tendon: No Kristin Mora, Kristin Mora ([id number]) Muscle: No Joint: No Bone: No Limited to Skin Breakdown Epithelialization: Large (67-100%) N/A N/A Periwound Skin Texture: Scarring: Yes N/A N/A Excoriation: No Induration: No Callus: No Crepitus: No Rash: No Periwound Skin Maceration: No N/A N/A Moisture: Dry/Scaly: No Periwound Skin Color: Atrophie Blanche: No N/A N/A Cyanosis: No Ecchymosis: No Erythema: No Hemosiderin Staining: No Mottled: No Pallor: No Rubor: No Temperature: No Abnormality N/A N/A Tenderness on Yes N/A N/A Palpation: Wound Preparation: Ulcer Cleansing: N/A N/A Rinsed/Irrigated with Saline Topical Anesthetic Applied: None Treatment Notes Wound #1 (Left Axilla) 1. Cleansed with: Cleanse wound with antibacterial soap and water 4. Dressing Applied: Aquacel Ag 5. Secondary Dressing Applied Bordered Foam Dressing Electronic Signature(s) Signed: 07/21/2016 10:04:09 AM By: Christin Fudge MD, FACS Entered By: Christin Fudge  on 07/21/2016 10:04:08 Kristin Mora ([id number]) -------------------------------------------------------------------------------- [place] Details Patient Name: Kristin Mora Date of Service: 07/21/2016 9:00 AM Medical Record Number: [id number] Patient Account Number: [id number] Date of Birth/Sex: April 27, 1956 (61 y.o. Female) Treating RN: Cornell Barman Primary Care Marguita Venning: Carmon Ginsberg Other Clinician: Referring Cheron Coryell: Carmon Ginsberg Treating Pamala Hayman/Extender: Frann Rider in Treatment: 0 Active Inactive ` Orientation to the Wound Care Program Nursing Diagnoses: Knowledge deficit related to the wound healing center program Goals: Patient/caregiver will verbalize understanding of the [place] Program Date Initiated: 07/21/2016 Target Resolution Date: 08/01/2016 Goal Status: Active Interventions: Provide education on orientation to the wound center Notes: ` Soft Tissue Infection Nursing Diagnoses: Impaired tissue integrity Knowledge deficit related to home infection control: handwashing, handling of soiled dressings, supply storage Potential for infection: soft tissue Goals: Patient will remain free of wound infection Date Initiated: 07/21/2016 Target Resolution Date: 08/01/2016 Goal Status: Active Patient/caregiver will verbalize understanding of or measures to prevent infection and contamination in the home setting Date Initiated: 07/21/2016 Target Resolution Date: 08/01/2016 Goal Status: Active Signs and symptoms of infection will be recognized early to allow for prompt treatment Date Initiated: 07/21/2016 Target Resolution Date: 08/01/2016 Goal Status: Active Kristin Mora, Kristin Mora ([id number]) Interventions: Assess signs and symptoms of infection every visit Treatment Activities: Systemic antibiotics : 07/21/2016 Notes: ` Wound/Skin Impairment Nursing Diagnoses: Impaired tissue integrity Goals: Ulcer/skin breakdown will have  a volume reduction of 30% by week 4 Date Initiated: 07/21/2016 Target Resolution Date: 08/18/2016 Goal Status: Active Interventions: Assess patient/caregiver ability to obtain necessary supplies Assess ulceration(s) every visit Notes: Electronic Signature(s) Signed: 07/21/2016 3:30:03 PM By: Gretta Cool, RN, BSN, Kim RN, BSN Entered By: Gretta Cool, RN, BSN, Kim on 07/21/2016 09:46:24 Kristin Mora ([id number]) -------------------------------------------------------------------------------- Pain Assessment Details Patient Name: Kristin Mora. Date of Service: 07/21/2016 9:00 AM Medical Record Number: [id number] Patient Account Number: [id number] Date of Birth/Sex: [birth year] (61 y.o. Female) Treating RN: Cornell Barman Primary Care Analis Distler: Carmon Ginsberg Other Clinician: Referring Benjamin Casanas: Carmon Ginsberg Treating Kimaya Whitlatch/Extender: Frann Rider in Treatment: 0 Active Problems Location of Pain Severity and Description of Pain Patient Has Paino No Site Locations With Dressing Change: No Pain Management and Medication Current Pain Management: Goals for Pain Management Topical or injectable lidocaine is offered to patient for acute pain when surgical debridement is performed. If needed, Patient is instructed to use over the counter pain medication for the following 24-48 hours after debridement.  Wound care MDs do not prescribed pain medications. Patient has chronic pain or uncontrolled pain. Patient has been instructed to make an appointment with their Primary Care Physician for pain management. Electronic Signature(s) Signed: 07/21/2016 3:30:03 PM By: Gretta Cool, RN, BSN, Kim RN, BSN Entered By: Gretta Cool, RN, BSN, Kim on 07/21/2016 09:16:53 Kristin Mora ([id number]) -------------------------------------------------------------------------------- Patient/Caregiver Education Details Patient Name: Kristin Mora Date of Service: 07/21/2016 9:00 AM Medical Record Number:  [id number] Patient Account Number: [id number] Date of Birth/Gender: 1955-08-02 (61 y.o. Female) Treating RN: Cornell Barman Primary Care Physician: Carmon Ginsberg Other Clinician: Referring Physician: Carmon Ginsberg Treating Physician/Extender: Frann Rider in Treatment: 0 Education Assessment Education Provided To: Patient Education Topics Provided Basic Hygiene: Handouts: Other: wash with soap and water, pat dry Methods: Demonstration Responses: State content correctly Welcome To [facility]: Handouts: Welcome To [facility] Methods: Explain/Verbal Responses: State content correctly Wound/Skin Impairment: Handouts: Caring for Your Ulcer, Other: wound care as prescribed Methods: Demonstration, Explain/Verbal Responses: State content correctly Electronic Signature(s) Signed: 07/21/2016 3:30:03 PM By: Gretta Cool, RN, BSN, Kim RN, BSN Entered By: Gretta Cool, RN, BSN, Kim on 07/21/2016 09:53:08 Kristin Mora ([id number]) -------------------------------------------------------------------------------- Wound Assessment Details Patient Name: Kristin Mora. Date of Service: 07/21/2016 9:00 AM Medical Record Number: [id number] Patient Account Number: [id number] Date of Birth/Sex: Nov 28, 1955 (61 y.o. Female) Treating RN: Cornell Barman Primary Care Emonie Espericueta: Carmon Ginsberg Other Clinician: Referring Sye Schroepfer: Carmon Ginsberg Treating Karema Tocci/Extender: Frann Rider in Treatment: 0 Wound Status Wound Number: 1 Primary Etiology: Abscess Wound Location: Left Axilla Wound Status: Open Wounding Event: Gradually Appeared Comorbid History: Hypertension, Type II Diabetes Date Acquired: 05/23/2016 Weeks Of Treatment: 0 Clustered Wound: No Photos Wound Measurements Length: (cm) 1 Width: (cm) 2 Depth: (cm) 0.1 Area: (cm) 1.571 Volume: (cm) 0.157 % Reduction in Area: % Reduction in Volume: Epithelialization: Large (67-100%) Tunneling: No Undermining:  No Wound Description Classification: Partial Thickness Foul Odor Aft Wound Margin: Distinct, outline attached Slough/Fibrin Exudate Amount: Medium Exudate Type: Serous Exudate Color: amber er Cleansing: No o No Wound Bed Granulation Amount: Large (67-100%) Exposed Structure Granulation Quality: Pink Fascia Exposed: No Necrotic Amount: None Present (0%) Fat Layer (Subcutaneous Tissue) Exposed: No Tendon Exposed: No Muscle Exposed: No Joint Exposed: No Bone Exposed: No Kristin Mora, Kristin Mora ([id number]) Limited to Skin Breakdown Periwound Skin Texture Texture Color No Abnormalities Noted: No No Abnormalities Noted: No Callus: No Atrophie Blanche: No Crepitus: No Cyanosis: No Excoriation: No Ecchymosis: No Induration: No Erythema: No Rash: No Hemosiderin Staining: No Scarring: Yes Mottled: No Pallor: No Moisture Rubor: No No Abnormalities Noted: No Dry / Scaly: No Temperature / Pain Maceration: No Temperature: No Abnormality Tenderness on Palpation: Yes Wound Preparation Ulcer Cleansing: Rinsed/Irrigated with Saline Topical Anesthetic Applied: None Treatment Notes Wound #1 (Left Axilla) 1. Cleansed with: Cleanse wound with antibacterial soap and water 4. Dressing Applied: Aquacel Ag 5. Secondary Dressing Applied Bordered Foam Dressing Electronic Signature(s) Signed: 07/21/2016 3:30:03 PM By: Gretta Cool, RN, BSN, Kim RN, BSN Entered By: Gretta Cool, RN, BSN, Kim on 07/21/2016 09:34:45 Kristin Mora ([id number]) -------------------------------------------------------------------------------- [place] Details Patient Name: Kristin Mora Date of Service: 07/21/2016 9:00 AM Medical Record Number: [id number] Patient Account Number: [id number] Date of Birth/Sex: 1956/06/23 (61 y.o. Female) Treating RN: Cornell Barman Primary Care Chrishawna Farina: Carmon Ginsberg Other Clinician: Referring Leontina Skidmore: Carmon Ginsberg Treating Princess Karnes/Extender: Frann Rider in Treatment:  0 Vital Signs Time Taken: 09:15 Temperature (F): 98.1 Height (in): 62 Pulse (bpm): 77 Source: Stated Respiratory Rate (breaths/min): 16 Weight (lbs):  296 Blood Pressure (mmHg): 154/96 Source: Stated Reference Range: 80 - 120 mg / dl Body Mass Index (BMI): 54.1 Electronic Signature(s) Signed: 07/21/2016 3:30:03 PM By: Gretta Cool, RN, BSN, Kim RN, BSN Entered By: Gretta Cool, RN, BSN, Kim on 07/21/2016 09:16:59

## 2016-07-23 ENCOUNTER — Other Ambulatory Visit: Payer: Self-pay | Admitting: Family Medicine

## 2016-07-28 ENCOUNTER — Encounter: Payer: Self-pay | Attending: Surgery | Admitting: Surgery

## 2016-07-28 DIAGNOSIS — L732 Hidradenitis suppurativa: Secondary | ICD-10-CM | POA: Insufficient documentation

## 2016-07-28 DIAGNOSIS — Z8673 Personal history of transient ischemic attack (TIA), and cerebral infarction without residual deficits: Secondary | ICD-10-CM | POA: Insufficient documentation

## 2016-07-28 DIAGNOSIS — I10 Essential (primary) hypertension: Secondary | ICD-10-CM | POA: Insufficient documentation

## 2016-07-28 DIAGNOSIS — X58XXXA Exposure to other specified factors, initial encounter: Secondary | ICD-10-CM | POA: Insufficient documentation

## 2016-07-28 DIAGNOSIS — Z87891 Personal history of nicotine dependence: Secondary | ICD-10-CM | POA: Insufficient documentation

## 2016-07-28 DIAGNOSIS — S2190XA Unspecified open wound of unspecified part of thorax, initial encounter: Secondary | ICD-10-CM | POA: Insufficient documentation

## 2016-07-28 DIAGNOSIS — R42 Dizziness and giddiness: Secondary | ICD-10-CM | POA: Insufficient documentation

## 2016-07-28 DIAGNOSIS — E11622 Type 2 diabetes mellitus with other skin ulcer: Secondary | ICD-10-CM | POA: Insufficient documentation

## 2016-07-28 DIAGNOSIS — J449 Chronic obstructive pulmonary disease, unspecified: Secondary | ICD-10-CM | POA: Insufficient documentation

## 2016-07-29 NOTE — Progress Notes (Signed)
NOVALYNN, OMORI ([id number]) Visit Report for 07/28/2016 Chief Complaint Document Details Patient Name: Kristin Mora, Kristin Mora. Date of Service: 07/28/2016 8:15 AM Medical Record Number: [id number] Patient Account Number: [id number] Date of Birth/Sex: Jun 03, 1956 (61 y.o. Female) Treating RN: Baruch Gouty, RN, BSN, Velva Harman Primary Care Provider: Carmon Ginsberg Other Clinician: Referring Provider: Carmon Ginsberg Treating Provider/Extender: Frann Rider in Treatment: 1 Information Obtained from: Patient Chief Complaint Patient seen for complaints of Non-Healing Wound the left axillary area which she's had for at least 2 months Electronic Signature(s) Signed: 07/28/2016 8:34:42 AM By: Christin Fudge MD, FACS Entered By: Christin Fudge on 07/28/2016 08:34:42 Kristin Mora ([id number]) -------------------------------------------------------------------------------- HPI Details Patient Name: Kristin Mora Date of Service: 07/28/2016 8:15 AM Medical Record Number: [id number] Patient Account Number: [id number] Date of Birth/Sex: [birth year] (61 y.o. Female) Treating RN: Baruch Gouty, RN, BSN, Velva Harman Primary Care Provider: Carmon Ginsberg Other Clinician: Referring Provider: Carmon Ginsberg Treating Provider/Extender: Frann Rider in Treatment: 1 History of Present Illness Location: after axillary area Quality: Patient reports No Pain. Severity: Patient states wound (s) are getting better. Duration: Patient has had the wound for > 2 months prior to seeking treatment at the wound center Context: The wound appeared gradually over time Modifying Factors: Other treatment(s) tried include:doxycycline and clindamycin gel Associated Signs and Symptoms: Patient reports having foul odor. HPI Description: 61 year old patient who has a past medical history significant for diabetes mellitus, essential hypertension, COPD, CVA, axillary hidradenitis repetitive, vertigo, recently presented to the emergency  department with hydradenitis suppurtiva, which is normally managed by her PCP and currently takes doxycycline. she is a former smoker and quit in 2011. ER advised her clindamycin gel locally and to follow-up with the wound center. asked hemoglobin A1c done 3 weeks ago was 6.5%. 07/28/2016 -- her dermatology appointment is not until the middle of March. Electronic Signature(s) Signed: 07/28/2016 8:35:01 AM By: Christin Fudge MD, FACS Entered By: Christin Fudge on 07/28/2016 08:35:01 Kristin Mora ([id number]) -------------------------------------------------------------------------------- Physical Exam Details Patient Name: Kristin Mora Date of Service: 07/28/2016 8:15 AM Medical Record Number: [id number] Patient Account Number: [id number] Date of Birth/Sex: 1955/11/21 (60 y.o. Female) Treating RN: Baruch Gouty, RN, BSN, Velva Harman Primary Care Provider: Carmon Ginsberg Other Clinician: Referring Provider: Carmon Ginsberg Treating Provider/Extender: Frann Rider in Treatment: 1 Constitutional . Pulse regular. Respirations normal and unlabored. Afebrile. . Eyes Nonicteric. Reactive to light. Ears, Nose, Mouth, and Throat Lips, teeth, and gums WNL.Marland Kitchen Moist mucosa without lesions. Neck supple and nontender. No palpable supraclavicular or cervical adenopathy. Normal sized without goiter. Respiratory WNL. No retractions.. Breath sounds WNL, No rubs, rales, rhonchi, or wheeze.. Cardiovascular Heart rhythm and rate regular, no murmur or gallop.. Pedal Pulses WNL. No clubbing, cyanosis or edema. Chest Breasts symmetical and no nipple discharge.. Breast tissue WNL, no masses, lumps, or tenderness.. Lymphatic No adneopathy. No adenopathy. No adenopathy. Musculoskeletal Adexa without tenderness or enlargement.. Digits and nails w/o clubbing, cyanosis, infection, petechiae, ischemia, or inflammatory conditions.. Integumentary (Hair, Skin) No suspicious lesions. No crepitus or fluctuance. No  peri-wound warmth or erythema. No masses.Marland Kitchen Psychiatric Judgement and insight Intact.. No evidence of depression, anxiety, or agitation.. Notes wound looks good has healthy granulation tissue and is up to lies at the side. No sharp debridement was required today. Electronic Signature(s) Signed: 07/28/2016 8:37:08 AM By: Christin Fudge MD, FACS Entered By: Christin Fudge on 07/28/2016 08:37:08 Kristin Mora ([id number]) -------------------------------------------------------------------------------- Physician Orders Details Patient Name: Kristin Mora Date of Service: 07/28/2016 8:15 AM Medical Record Number: [id number] Patient Account  Number: [id number] Date of Birth/Sex: 04-Aug-1955 (61 y.o. Female) Treating RN: Baruch Gouty, RN, BSN, Velva Harman Primary Care Provider: Carmon Ginsberg Other Clinician: Referring Provider: Carmon Ginsberg Treating Provider/Extender: Frann Rider in Treatment: 1 Verbal / Phone Orders: No Diagnosis Coding Wound Cleansing o Clean wound with Normal Saline. o Cleanse wound with mild soap and water Primary Wound Dressing Wound #1 Left Axilla o Aquacel Ag Secondary Dressing Wound #1 Left Axilla o Other - coverlet Dressing Change Frequency Wound #1 Left Axilla o Change dressing every other day. Follow-up Appointments Wound #1 Left Axilla o Return Appointment in 1 week. Electronic Signature(s) Signed: 07/28/2016 3:11:09 PM By: Christin Fudge MD, FACS Entered By: Christin Fudge on 07/28/2016 08:37:18 Kristin Mora ([id number]) -------------------------------------------------------------------------------- Problem List Details Patient Name: Kristin Mora Date of Service: 07/28/2016 8:15 AM Medical Record Number: [id number] Patient Account Number: [id number] Date of Birth/Sex: September 28, 1955 (61 y.o. Female) Treating RN: Baruch Gouty, RN, BSN, Velva Harman Primary Care Provider: Carmon Ginsberg Other Clinician: Referring Provider: Carmon Ginsberg Treating  Provider/Extender: Frann Rider in Treatment: 1 Active Problems ICD-10 Encounter Code Description Active Date Diagnosis E11.622 Type 2 diabetes mellitus with other skin ulcer 07/21/2016 Yes L73.2 Hidradenitis suppurativa 07/21/2016 Yes S21.90XA Unspecified open wound of unspecified part of thorax, 07/21/2016 Yes initial encounter Inactive Problems Resolved Problems Electronic Signature(s) Signed: 07/28/2016 8:34:26 AM By: Christin Fudge MD, FACS Entered By: Christin Fudge on 07/28/2016 08:34:26 Kristin Mora ([id number]) -------------------------------------------------------------------------------- Progress Note Details Patient Name: Kristin Mora Date of Service: 07/28/2016 8:15 AM Medical Record Number: [id number] Patient Account Number: [id number] Date of Birth/Sex: 01-18-1956 (61 y.o. Female) Treating RN: Afful, RN, BSN, Velva Harman Primary Care Provider: Carmon Ginsberg Other Clinician: Referring Provider: Carmon Ginsberg Treating Provider/Extender: Frann Rider in Treatment: 1 Subjective Chief Complaint Information obtained from Patient Patient seen for complaints of Non-Healing Wound the left axillary area which she's had for at least 2 months History of Present Illness (HPI) The following HPI elements were documented for the patient's wound: Location: after axillary area Quality: Patient reports No Pain. Severity: Patient states wound (s) are getting better. Duration: Patient has had the wound for > 2 months prior to seeking treatment at the wound center Context: The wound appeared gradually over time Modifying Factors: Other treatment(s) tried include:doxycycline and clindamycin gel Associated Signs and Symptoms: Patient reports having foul odor. 61 year old patient who has a past medical history significant for diabetes mellitus, essential hypertension, COPD, CVA, axillary hidradenitis repetitive, vertigo, recently presented to the emergency department  with hydradenitis suppurtiva, which is normally managed by her PCP and currently takes doxycycline. she is a former smoker and quit in 2011. ER advised her clindamycin gel locally and to follow-up with the wound center. asked hemoglobin A1c done 3 weeks ago was 6.5%. 07/28/2016 -- her dermatology appointment is not until the middle of March. Objective Constitutional Pulse regular. Respirations normal and unlabored. Afebrile. Vitals Time Taken: 8:21 AM, Height: 62 in, Weight: 296 lbs, BMI: 54.1, Temperature: 98.1 F, Pulse: 76 bpm, Respiratory Rate: 16 breaths/min, Blood Pressure: 169/69 mmHg. Eyes Kristin Mora, Kristin Mora ([id number]) Nonicteric. Reactive to light. Ears, Nose, Mouth, and Throat Lips, teeth, and gums WNL.Marland Kitchen Moist mucosa without lesions. Neck supple and nontender. No palpable supraclavicular or cervical adenopathy. Normal sized without goiter. Respiratory WNL. No retractions.. Breath sounds WNL, No rubs, rales, rhonchi, or wheeze.. Cardiovascular Heart rhythm and rate regular, no murmur or gallop.. Pedal Pulses WNL. No clubbing, cyanosis or edema. Chest Breasts symmetical and no nipple discharge.. Breast tissue WNL, no masses,  lumps, or tenderness.. Lymphatic No adneopathy. No adenopathy. No adenopathy. Musculoskeletal Adexa without tenderness or enlargement.. Digits and nails w/o clubbing, cyanosis, infection, petechiae, ischemia, or inflammatory conditions.Marland Kitchen Psychiatric Judgement and insight Intact.. No evidence of depression, anxiety, or agitation.. General Notes: wound looks good has healthy granulation tissue and is up to lies at the side. No sharp debridement was required today. Integumentary (Hair, Skin) No suspicious lesions. No crepitus or fluctuance. No peri-wound warmth or erythema. No masses.. Wound #1 status is Open. Original cause of wound was Gradually Appeared. The wound is located on the Left Axilla. The wound measures 1cm length x 2.4cm width x 0.1cm  depth; 1.885cm^2 area and 0.188cm^3 volume. The wound is limited to skin breakdown. There is no tunneling or undermining noted. There is a medium amount of serous drainage noted. The wound margin is distinct with the outline attached to the wound base. There is large (67-100%) pink granulation within the wound bed. There is no necrotic tissue within the wound bed. The periwound skin appearance exhibited: Scarring. The periwound skin appearance did not exhibit: Callus, Crepitus, Excoriation, Induration, Rash, Dry/Scaly, Maceration, Atrophie Blanche, Cyanosis, Ecchymosis, Hemosiderin Staining, Mottled, Pallor, Rubor, Erythema. Periwound temperature was noted as No Abnormality. The periwound has tenderness on palpation. Assessment Kristin Mora, Kristin Mora ([id number]) Active Problems ICD-10 E11.622 - Type 2 diabetes mellitus with other skin ulcer L73.2 - Hidradenitis suppurativa S21.90XA - Unspecified open wound of unspecified part of thorax, initial encounter Plan Wound Cleansing: Clean wound with Normal Saline. Cleanse wound with mild soap and water Primary Wound Dressing: Wound #1 Left Axilla: Aquacel Ag Secondary Dressing: Wound #1 Left Axilla: Other - coverlet Dressing Change Frequency: Wound #1 Left Axilla: Change dressing every other day. Follow-up Appointments: Wound #1 Left Axilla: Return Appointment in 1 week. I have recommended; 1. Silver alginate with a bordered foam to be applied every other day. 2. good control of her diabetes mellitus 3. Follow-up with her dermatology appointment 4. Review by me next week Electronic Signature(s) Signed: 07/28/2016 8:38:16 AM By: Christin Fudge MD, FACS Entered By: Christin Fudge on 07/28/2016 08:38:15 Kristin Mora ([id number]) -------------------------------------------------------------------------------- SuperBill Details Patient Name: Kristin Mora Date of Service: 07/28/2016 Medical Record Number: [id number] Patient Account  Number: [id number] Date of Birth/Sex: 1955-11-09 (61 y.o. Female) Treating RN: Baruch Gouty, RN, BSN, Velva Harman Primary Care Provider: Carmon Ginsberg Other Clinician: Referring Provider: Carmon Ginsberg Treating Provider/Extender: Frann Rider in Treatment: 1 Diagnosis Coding ICD-10 Codes Code Description [phone] Type 2 diabetes mellitus with other skin ulcer L73.2 Hidradenitis suppurativa S21.90XA Unspecified open wound of unspecified part of thorax, initial encounter Physician Procedures CPT4 Code Description: [id number] - WC PHYS LEVEL 3 - EST PT ICD-10 Description Diagnosis E11.622 Type 2 diabetes mellitus with other skin ulcer L73.2 Hidradenitis suppurativa S21.90XA Unspecified open wound of unspecified part of thor Modifier: ax, initial en Quantity: 1 counter Electronic Signature(s) Signed: 07/28/2016 8:39:31 AM By: Christin Fudge MD, FACS Entered By: Christin Fudge on 07/28/2016 08:39:31

## 2016-07-29 NOTE — Progress Notes (Signed)
Kristin Mora, Kristin Mora ([id number]) Visit Report for 07/28/2016 Arrival Information Details Patient Name: Kristin Mora, Kristin Mora. Date of Service: 07/28/2016 8:15 AM Medical Record Number: [id number] Patient Account Number: [id number] Date of Birth/Sex: Apr 12, 1956 (61 y.o. Female) Treating RN: Afful, RN, BSN, Velva Harman Primary Care Zeinab Rodwell: Carmon Ginsberg Other Clinician: Referring Samaia Iwata: Carmon Ginsberg Treating Gaylin Osoria/Extender: Frann Rider in Treatment: 1 Visit Information History Since Last Visit All ordered tests and consults were completed: No Patient Arrived: Ambulatory Added or deleted any medications: No Arrival Time: 08:19 Any new allergies or adverse reactions: No Accompanied By: dtr Had a fall or experienced change in No Transfer Assistance: None activities of daily living that may affect Patient Identification Verified: Yes risk of falls: Secondary Verification Process Yes Signs or symptoms of abuse/neglect since last No Completed: visito Patient Requires Transmission- No Hospitalized since last visit: No Based Precautions: Has Dressing in Place as Prescribed: Yes Patient Has Alerts: Yes Pain Present Now: No Patient Alerts: Type II Diabetic Electronic Signature(s) Signed: 07/28/2016 3:37:28 PM By: Regan Lemming BSN, RN Entered By: Regan Lemming on 07/28/2016 08:21:07 Kristin Mora ([id number]) -------------------------------------------------------------------------------- Clinic Level of Care Assessment Details Patient Name: Kristin Mora Date of Service: 07/28/2016 8:15 AM Medical Record Number: [id number] Patient Account Number: [id number] Date of Birth/Sex: November 28, 1955 (61 y.o. Female) Treating RN: Afful, RN, BSN, [place] Primary Care Doron Shake: Carmon Ginsberg Other Clinician: Referring Claude Swendsen: Carmon Ginsberg Treating Lewis Grivas/Extender: Frann Rider in Treatment: 1 Clinic Level of Care Assessment Items TOOL 4 Quantity Score []  - Use when only an EandM  is performed on FOLLOW-UP visit 0 ASSESSMENTS - Nursing Assessment / Reassessment X - Reassessment of Co-morbidities (includes updates in patient status) 1 10 X - Reassessment of Adherence to Treatment Plan 1 5 ASSESSMENTS - Wound and Skin Assessment / Reassessment X - Simple Wound Assessment / Reassessment - one wound 1 5 []  - Complex Wound Assessment / Reassessment - multiple wounds 0 []  - Dermatologic / Skin Assessment (not related to wound area) 0 ASSESSMENTS - Focused Assessment []  - Circumferential Edema Measurements - multi extremities 0 []  - Nutritional Assessment / Counseling / Intervention 0 []  - Lower Extremity Assessment (monofilament, tuning fork, pulses) 0 []  - Peripheral Arterial Disease Assessment (using hand held doppler) 0 ASSESSMENTS - Ostomy and/or Continence Assessment and Care []  - Incontinence Assessment and Management 0 []  - Ostomy Care Assessment and Management (repouching, etc.) 0 PROCESS - Coordination of Care X - Simple Patient / Family Education for ongoing care 1 15 []  - Complex (extensive) Patient / Family Education for ongoing care 0 []  - Staff obtains Programmer, systems, Records, Test Results / Process Orders 0 []  - Staff telephones HHA, Nursing Homes / Clarify orders / etc 0 []  - Routine Transfer to another Facility (non-emergent condition) 0 Kristin Mora, Kristin Mora ([id number]) []  - Routine Hospital Admission (non-emergent condition) 0 []  - New Admissions / Biomedical engineer / Ordering NPWT, Apligraf, etc. 0 []  - Emergency Hospital Admission (emergent condition) 0 []  - Simple Discharge Coordination 0 []  - Complex (extensive) Discharge Coordination 0 PROCESS - Special Needs []  - Pediatric / Minor Patient Management 0 []  - Isolation Patient Management 0 []  - Hearing / Language / Visual special needs 0 []  - Assessment of Community assistance (transportation, D/C planning, etc.) 0 []  - Additional assistance / Altered mentation 0 []  - Support Surface(s)  Assessment (bed, cushion, seat, etc.) 0 INTERVENTIONS - Wound Cleansing / Measurement X - Simple Wound Cleansing - one wound 1 5 []  - Complex Wound Cleansing -  multiple wounds 0 X - Wound Imaging (photographs - any number of wounds) 1 5 []  - Wound Tracing (instead of photographs) 0 X - Simple Wound Measurement - one wound 1 5 []  - Complex Wound Measurement - multiple wounds 0 INTERVENTIONS - Wound Dressings X - Small Wound Dressing one or multiple wounds 1 10 []  - Medium Wound Dressing one or multiple wounds 0 []  - Large Wound Dressing one or multiple wounds 0 []  - Application of Medications - topical 0 []  - Application of Medications - injection 0 INTERVENTIONS - Miscellaneous []  - External ear exam 0 Kristin Mora, Kristin Mora ([id number]) []  - Specimen Collection (cultures, biopsies, blood, body fluids, etc.) 0 []  - Specimen(s) / Culture(s) sent or taken to Lab for analysis 0 []  - Patient Transfer (multiple staff / Harrel Lemon Lift / Similar devices) 0 []  - Simple Staple / Suture removal (25 or less) 0 []  - Complex Staple / Suture removal (26 or more) 0 []  - Hypo / Hyperglycemic Management (close monitor of Blood Glucose) 0 []  - Ankle / Brachial Index (ABI) - do not check if billed separately 0 X - Vital Signs 1 5 Has the patient been seen at the hospital within the last three years: Yes Total Score: 65 Level Of Care: New/Established - Level 2 Electronic Signature(s) Unsigned Entered ByRegan Lemming on 07/29/2016 09:43:37 Signature(s): Date(s): Kristin Mora, Kristin Mora ([id number]) -------------------------------------------------------------------------------- Encounter Discharge Information Details Patient Name: Kristin Mora, Kristin Mora. Date of Service: 07/28/2016 8:15 AM Medical Record Number: [id number] Patient Account Number: [id number] Date of Birth/Sex: 1956/06/03 (61 y.o. Female) Treating RN: Baruch Gouty, RN, BSN, Velva Harman Primary Care Keylani Perlstein: Carmon Ginsberg Other Clinician: Referring Hadli Vandemark: Carmon Ginsberg Treating Francile Woolford/Extender: Frann Rider in Treatment: 1 Encounter Discharge Information Items Schedule Follow-up Appointment: No Medication Reconciliation completed No and provided to Patient/Care Nakita Santerre: Provided on Clinical Summary of Care: 07/28/2016 Form Type Recipient Paper Patient JP Electronic Signature(s) Signed: 07/28/2016 8:32:15 AM By: Ruthine Dose Entered By: Ruthine Dose on 07/28/2016 08:32:15 Kristin Mora ([id number]) -------------------------------------------------------------------------------- Lower Extremity Assessment Details Patient Name: Kristin Mora Date of Service: 07/28/2016 8:15 AM Medical Record Number: [id number] Patient Account Number: [id number] Date of Birth/Sex: 1955-10-25 (61 y.o. Female) Treating RN: Afful, RN, BSN, [employer] Primary Care Diedra Sinor: Carmon Ginsberg Other Clinician: Referring Braley Luckenbaugh: Carmon Ginsberg Treating Analeigh Aries/Extender: Frann Rider in Treatment: 1 Electronic Signature(s) Signed: 07/28/2016 3:37:28 PM By: Regan Lemming BSN, RN Entered By: Regan Lemming on 07/28/2016 08:21:23 Kristin Mora ([id number]) -------------------------------------------------------------------------------- Multi Wound Chart Details Patient Name: Kristin Mora Date of Service: 07/28/2016 8:15 AM Medical Record Number: [id number] Patient Account Number: [id number] Date of Birth/Sex: [birth year] (61 y.o. Female) Treating RN: Baruch Gouty, RN, BSN, Velva Harman Primary Care Keyna Blizard: Carmon Ginsberg Other Clinician: Referring Hayzel Ruberg: Carmon Ginsberg Treating Gio Janoski/Extender: Frann Rider in Treatment: 1 Vital Signs Height(in): 62 Pulse(bpm): 76 Weight(lbs): 296 Blood Pressure 169/69 (mmHg): Body Mass Index(BMI): 54 Temperature(F): 98.1 Respiratory Rate 16 (breaths/min): Photos: [1:No Photos] [N/A:N/A] Wound Location: [1:Left Axilla] [N/A:N/A] Wounding Event: [1:Gradually Appeared] [N/A:N/A] Primary Etiology:  [1:Abscess] [N/A:N/A] Comorbid History: [1:Hypertension, Type II Diabetes] [N/A:N/A] Date Acquired: [1:05/23/2016] [N/A:N/A] Weeks of Treatment: [1:1] [N/A:N/A] Wound Status: [1:Open] [N/A:N/A] Measurements L x W x D 1x2.4x0.1 [N/A:N/A] (cm) Area (cm) : [1:1.885] [N/A:N/A] Volume (cm) : [1:0.188] [N/A:N/A] % Reduction in Area: [1:-20.00%] [N/A:N/A] % Reduction in Volume: -19.70% [N/A:N/A] Classification: [1:Partial Thickness] [N/A:N/A] Exudate Amount: [1:Medium] [N/A:N/A] Exudate Type: [1:Serous] [N/A:N/A] Exudate Color: [1:amber] [N/A:N/A] Wound Margin: [1:Distinct, outline attached] [N/A:N/A] Granulation Amount: [1:Large (67-100%)] [N/A:N/A] Granulation Quality: [  1:Pink] [N/A:N/A] Necrotic Amount: [1:None Present (0%)] [N/A:N/A] Exposed Structures: [1:Fascia: No Fat Layer (Subcutaneous Tissue) Exposed: No Tendon: No Muscle: No Joint: No Bone: No] [N/A:N/A] Limited to Skin Breakdown Epithelialization: Medium (34-66%) N/A N/A Periwound Skin Texture: Scarring: Yes N/A N/A Excoriation: No Induration: No Callus: No Crepitus: No Rash: No Periwound Skin Maceration: No N/A N/A Moisture: Dry/Scaly: No Periwound Skin Color: Atrophie Blanche: No N/A N/A Cyanosis: No Ecchymosis: No Erythema: No Hemosiderin Staining: No Mottled: No Pallor: No Rubor: No Temperature: No Abnormality N/A N/A Tenderness on Yes N/A N/A Palpation: Wound Preparation: Ulcer Cleansing: N/A N/A Rinsed/Irrigated with Saline Topical Anesthetic Applied: None Treatment Notes Electronic Signature(s) Signed: 07/28/2016 8:34:36 AM By: Christin Fudge MD, FACS Entered By: Christin Fudge on 07/28/2016 08:34:36 Kristin Mora ([id number]) -------------------------------------------------------------------------------- [place] Details Patient Name: Kristin Mora Date of Service: 07/28/2016 8:15 AM Medical Record Number: [id number] Patient Account Number: [id number] Date of Birth/Sex:  09-Nov-1955 (61 y.o. Female) Treating RN: Afful, RN, BSN, Velva Harman Primary Care Clarabel Marion: Carmon Ginsberg Other Clinician: Referring Ayleah Hofmeister: Carmon Ginsberg Treating Makari Portman/Extender: Frann Rider in Treatment: 1 Active Inactive ` Orientation to the Wound Care Program Nursing Diagnoses: Knowledge deficit related to the wound healing center program Goals: Patient/caregiver will verbalize understanding of the [facility] Program Date Initiated: 07/21/2016 Target Resolution Date: 08/01/2016 Goal Status: Active Interventions: Provide education on orientation to the wound center Notes: ` Soft Tissue Infection Nursing Diagnoses: Impaired tissue integrity Knowledge deficit related to home infection control: handwashing, handling of soiled dressings, supply storage Potential for infection: soft tissue Goals: Patient will remain free of wound infection Date Initiated: 07/21/2016 Target Resolution Date: 08/01/2016 Goal Status: Active Patient/caregiver will verbalize understanding of or measures to prevent infection and contamination in the home setting Date Initiated: 07/21/2016 Target Resolution Date: 08/01/2016 Goal Status: Active Signs and symptoms of infection will be recognized early to allow for prompt treatment Date Initiated: 07/21/2016 Target Resolution Date: 08/01/2016 Goal Status: Active Kristin Mora, Kristin Mora ([id number]) Interventions: Assess signs and symptoms of infection every visit Treatment Activities: Systemic antibiotics : 07/21/2016 Notes: ` Wound/Skin Impairment Nursing Diagnoses: Impaired tissue integrity Goals: Ulcer/skin breakdown will have a volume reduction of 30% by week 4 Date Initiated: 07/21/2016 Target Resolution Date: 08/18/2016 Goal Status: Active Interventions: Assess patient/caregiver ability to obtain necessary supplies Assess ulceration(s) every visit Notes: Electronic Signature(s) Signed: 07/28/2016 3:37:28 PM By: Regan Lemming BSN,  RN Entered By: Regan Lemming on 07/28/2016 08:28:53 Kristin Mora ([id number]) -------------------------------------------------------------------------------- Pain Assessment Details Patient Name: Kristin Mora Date of Service: 07/28/2016 8:15 AM Medical Record Number: [id number] Patient Account Number: [id number] Date of Birth/Sex: [birth year] (61 y.o. Female) Treating RN: Baruch Gouty, RN, BSN, Velva Harman Primary Care Ethyle Tiedt: Carmon Ginsberg Other Clinician: Referring Charlisha Market: Carmon Ginsberg Treating Alvine Mostafa/Extender: Frann Rider in Treatment: 1 Active Problems Location of Pain Severity and Description of Pain Patient Has Paino No Site Locations With Dressing Change: No Pain Management and Medication Current Pain Management: Electronic Signature(s) Signed: 07/28/2016 3:37:28 PM By: Regan Lemming BSN, RN Entered By: Regan Lemming on 07/28/2016 08:21:14 Kristin Mora ([id number]) -------------------------------------------------------------------------------- Wound Assessment Details Patient Name: Kristin Mora Date of Service: 07/28/2016 8:15 AM Medical Record Number: [id number] Patient Account Number: [id number] Date of Birth/Sex: 1955-12-12 (61 y.o. Female) Treating RN: Baruch Gouty, RN, BSN, Velva Harman Primary Care Machell Wirthlin: Carmon Ginsberg Other Clinician: Referring Rosio Weiss: Carmon Ginsberg Treating Kaily Wragg/Extender: Frann Rider in Treatment: 1 Wound Status Wound Number: 1 Primary Etiology: Abscess Wound Location: Left Axilla Wound Status: Open Wounding Event:  Gradually Appeared Comorbid History: Hypertension, Type II Diabetes Date Acquired: 05/23/2016 Weeks Of Treatment: 1 Clustered Wound: No Photos Photo Uploaded By: Regan Lemming on 07/28/2016 15:35:28 Wound Measurements Length: (cm) 1 Width: (cm) 2.4 Depth: (cm) 0.1 Area: (cm) 1.885 Volume: (cm) 0.188 % Reduction in Area: -20% % Reduction in Volume: -19.7% Epithelialization: Medium (34-66%) Tunneling:  No Undermining: No Wound Description Classification: Partial Thickness Foul Odor Aft Wound Margin: Distinct, outline attached Slough/Fibrin Exudate Amount: Medium Exudate Type: Serous Exudate Color: amber er Cleansing: No o No Wound Bed Granulation Amount: Large (67-100%) Exposed Structure Granulation Quality: Pink Fascia Exposed: No Necrotic Amount: None Present (0%) Fat Layer (Subcutaneous Tissue) Exposed: No Tendon Exposed: No Kristin Mora, Kristin Mora ([id number]) Muscle Exposed: No Joint Exposed: No Bone Exposed: No Limited to Skin Breakdown Periwound Skin Texture Texture Color No Abnormalities Noted: No No Abnormalities Noted: No Callus: No Atrophie Blanche: No Crepitus: No Cyanosis: No Excoriation: No Ecchymosis: No Induration: No Erythema: No Rash: No Hemosiderin Staining: No Scarring: Yes Mottled: No Pallor: No Moisture Rubor: No No Abnormalities Noted: No Dry / Scaly: No Temperature / Pain Maceration: No Temperature: No Abnormality Tenderness on Palpation: Yes Wound Preparation Ulcer Cleansing: Rinsed/Irrigated with Saline Topical Anesthetic Applied: None Electronic Signature(s) Signed: 07/28/2016 3:37:28 PM By: Regan Lemming BSN, RN Entered By: Regan Lemming on 07/28/2016 08:24:35 Kristin Mora ([id number]) -------------------------------------------------------------------------------- Vitals Details Patient Name: Kristin Mora Date of Service: 07/28/2016 8:15 AM Medical Record Number: [id number] Patient Account Number: [id number] Date of Birth/Sex: [birth year] (61 y.o. Female) Treating RN: Afful, RN, BSN, [place] Primary Care Kanoa Phillippi: Carmon Ginsberg Other Clinician: Referring Jerry Haugen: Carmon Ginsberg Treating Sojourner Behringer/Extender: Frann Rider in Treatment: 1 Vital Signs Time Taken: 08:21 Temperature (F): 98.1 Height (in): 62 Pulse (bpm): 76 Weight (lbs): 296 Respiratory Rate (breaths/min): 16 Body Mass Index (BMI): 54.1 Blood Pressure  (mmHg): 169/69 Reference Range: 80 - 120 mg / dl Electronic Signature(s) Signed: 07/28/2016 3:37:28 PM By: Regan Lemming BSN, RN Entered By: Regan Lemming on 07/28/2016 08:22:06

## 2016-08-04 ENCOUNTER — Ambulatory Visit: Payer: Self-pay | Admitting: Surgery

## 2016-08-05 ENCOUNTER — Encounter: Payer: Self-pay | Admitting: Surgery

## 2016-08-06 NOTE — Progress Notes (Addendum)
ORIEL, GAL ([id number]) Visit Report for 08/05/2016 Arrival Information Details Patient Name: Kristin Mora, Kristin Mora. Date of Service: 08/05/2016 2:15 PM Medical Record Number: [id number] Patient Account Number: [id number] Date of Birth/Sex: [birth year] (61 y.o. Female) Treating RN: Afful, RN, BSN, Velva Harman Primary Care Lenix Benoist: Carmon Ginsberg Other Clinician: Referring Kayelynn Abdou: Carmon Ginsberg Treating Yula Crotwell/Extender: Frann Rider in Treatment: 2 Visit Information History Since Last Visit All ordered tests and consults were completed: No Patient Arrived: Ambulatory Added or deleted any medications: No Arrival Time: 14:11 Any new allergies or adverse reactions: No Accompanied By: dtr Had a fall or experienced change in No Transfer Assistance: None activities of daily living that may affect Patient Identification Verified: Yes risk of falls: Secondary Verification Process Yes Signs or symptoms of abuse/neglect since last No Completed: visito Patient Requires Transmission- No Hospitalized since last visit: No Based Precautions: Has Dressing in Place as Prescribed: Yes Patient Has Alerts: Yes Pain Present Now: No Patient Alerts: Type II Diabetic Electronic Signature(s) Signed: 08/05/2016 2:11:46 PM By: Regan Lemming BSN, RN Entered By: Regan Lemming on 08/05/2016 14:11:46 Kristin Mora ([id number]) -------------------------------------------------------------------------------- Clinic Level of Care Assessment Details Patient Name: Kristin Mora Date of Service: 08/05/2016 2:15 PM Medical Record Number: [id number] Patient Account Number: [id number] Date of Birth/Sex: Jun 06, 1956 (61 y.o. Female) Treating RN: Afful, RN, BSN, [place] Primary [facility] [employer] [place]: Carmon Ginsberg Other Clinician: Referring Taji Barretto: Carmon Ginsberg Treating Kanav Kazmierczak/Extender: Frann Rider in Treatment: 2 Clinic Level of Care Assessment Items TOOL 4 Quantity Score []  - Use when only an EandM  is performed on FOLLOW-UP visit 0 ASSESSMENTS - Nursing Assessment / Reassessment X - Reassessment of Co-morbidities (includes updates in patient status) 1 10 X - Reassessment of Adherence to Treatment Plan 1 5 ASSESSMENTS - Wound and Skin Assessment / Reassessment X - Simple Wound Assessment / Reassessment - one wound 1 5 []  - Complex Wound Assessment / Reassessment - multiple wounds 0 []  - Dermatologic / Skin Assessment (not related to wound area) 0 ASSESSMENTS - Focused Assessment []  - Circumferential Edema Measurements - multi extremities 0 []  - Nutritional Assessment / Counseling / Intervention 0 []  - Lower Extremity Assessment (monofilament, tuning fork, pulses) 0 []  - Peripheral Arterial Disease Assessment (using hand held doppler) 0 ASSESSMENTS - Ostomy and/or Continence Assessment and Care []  - Incontinence Assessment and Management 0 []  - Ostomy Care Assessment and Management (repouching, etc.) 0 PROCESS - Coordination of Care X - Simple Patient / Family Education for ongoing care 1 15 []  - Complex (extensive) Patient / Family Education for ongoing care 0 []  - Staff obtains Programmer, systems, Records, Test Results / Process Orders 0 []  - Staff telephones HHA, Nursing Homes / Clarify orders / etc 0 []  - Routine Transfer to another Facility (non-emergent condition) 0 DENASIA, WHISLER ([id number]) []  - Routine Hospital Admission (non-emergent condition) 0 []  - New Admissions / Biomedical engineer / Ordering NPWT, Apligraf, etc. 0 []  - Emergency Hospital Admission (emergent condition) 0 []  - Simple Discharge Coordination 0 []  - Complex (extensive) Discharge Coordination 0 PROCESS - Special Needs []  - Pediatric / Minor Patient Management 0 []  - Isolation Patient Management 0 []  - Hearing / Language / Visual special needs 0 []  - Assessment of Community assistance (transportation, D/C planning, etc.) 0 []  - Additional assistance / Altered mentation 0 []  - Support Surface(s)  Assessment (bed, cushion, seat, etc.) 0 INTERVENTIONS - Wound Cleansing / Measurement X - Simple Wound Cleansing - one wound 1 5 []  - Complex Wound Cleansing -  multiple wounds 0 X - Wound Imaging (photographs - any number of wounds) 1 5 []  - Wound Tracing (instead of photographs) 0 X - Simple Wound Measurement - one wound 1 5 []  - Complex Wound Measurement - multiple wounds 0 INTERVENTIONS - Wound Dressings X - Small Wound Dressing one or multiple wounds 1 10 []  - Medium Wound Dressing one or multiple wounds 0 []  - Large Wound Dressing one or multiple wounds 0 []  - Application of Medications - topical 0 []  - Application of Medications - injection 0 INTERVENTIONS - Miscellaneous []  - External ear exam 0 ROBBEN, FANFAN ([id number]) []  - Specimen Collection (cultures, biopsies, blood, body fluids, etc.) 0 []  - Specimen(s) / Culture(s) sent or taken to Lab for analysis 0 []  - Patient Transfer (multiple staff / Harrel Lemon Lift / Similar devices) 0 []  - Simple Staple / Suture removal (25 or less) 0 []  - Complex Staple / Suture removal (26 or more) 0 []  - Hypo / Hyperglycemic Management (close monitor of Blood Glucose) 0 []  - Ankle / Brachial Index (ABI) - do not check if billed separately 0 X - Vital Signs 1 5 Has the patient been seen at the hospital within the last three years: Yes Total Score: 65 Level Of Care: New/Established - Level 2 Electronic Signature(s) Signed: 08/05/2016 3:41:55 PM By: Regan Lemming BSN, RN Entered By: Regan Lemming on 08/05/2016 14:25:10 Kristin Mora ([id number]) -------------------------------------------------------------------------------- Encounter Discharge Information Details Patient Name: Kristin Mora Date of Service: 08/05/2016 2:15 PM Medical Record Number: [id number] Patient Account Number: [id number] Date of Birth/Sex: 1955-11-06 (61 y.o. Female) Treating RN: Baruch Gouty, RN, BSN, Velva Harman Primary Care Gill Delrossi: Carmon Ginsberg Other Clinician: Referring  Millisa Giarrusso: Carmon Ginsberg Treating Khloee Garza/Extender: Frann Rider in Treatment: 2 Encounter Discharge Information Items Discharge Pain Level: 0 Discharge Condition: Stable Ambulatory Status: Ambulatory Discharge Destination: Home Private Transportation: Auto Accompanied By: self Schedule Follow-up Appointment: No Medication Reconciliation completed and No provided to Patient/Care Josanne Boerema: Clinical Summary of Care: Electronic Signature(s) Signed: 08/05/2016 3:41:55 PM By: Regan Lemming BSN, RN Previous Signature: 08/05/2016 2:25:55 PM Version By: Ruthine Dose Entered By: Regan Lemming on 08/05/2016 14:28:14 Kristin Mora ([id number]) -------------------------------------------------------------------------------- Lower Extremity Assessment Details Patient Name: Kristin Mora Date of Service: 08/05/2016 2:15 PM Medical Record Number: [id number] Patient Account Number: [id number] Date of Birth/Sex: 1955-08-15 (61 y.o. Female) Treating RN: Afful, RN, BSN, [employer] Primary Care Mikael Debell: Carmon Ginsberg Other Clinician: Referring Ernestine Rohman: Carmon Ginsberg Treating Dusty Wagoner/Extender: Frann Rider in Treatment: 2 Electronic Signature(s) Signed: 08/05/2016 2:12:03 PM By: Regan Lemming BSN, RN Entered By: Regan Lemming on 08/05/2016 14:12:03 Kristin Mora ([id number]) -------------------------------------------------------------------------------- Multi Wound Chart Details Patient Name: Kristin Mora Date of Service: 08/05/2016 2:15 PM Medical Record Number: [id number] Patient Account Number: [id number] Date of Birth/Sex: 04-27-1956 (61 y.o. Female) Treating RN: Baruch Gouty, RN, BSN, Velva Harman Primary Care Shrihaan Porzio: Carmon Ginsberg Other Clinician: Referring Ariq Khamis: Carmon Ginsberg Treating Umi Mainor/Extender: Frann Rider in Treatment: 2 Vital Signs Height(in): 62 Pulse(bpm): Weight(lbs): 296 Blood Pressure 194/96 (mmHg): Body Mass Index(BMI): 54 Temperature(F):  98.2 Respiratory Rate 16 (breaths/min): Photos: [1:No Photos] [N/A:N/A] Wound Location: [1:Left Axilla] [N/A:N/A] Wounding Event: [1:Gradually Appeared] [N/A:N/A] Primary Etiology: [1:Abscess] [N/A:N/A] Comorbid History: [1:Hypertension, Type II Diabetes] [N/A:N/A] Date Acquired: [1:05/23/2016] [N/A:N/A] Weeks of Treatment: [1:2] [N/A:N/A] Wound Status: [1:Open] [N/A:N/A] Measurements L x W x D 0.9x1.6x0.1 [N/A:N/A] (cm) Area (cm) : [1:1.131] [N/A:N/A] Volume (cm) : [1:0.113] [N/A:N/A] % Reduction in Area: [1:28.00%] [N/A:N/A] % Reduction in Volume: 28.00% [N/A:N/A] Classification: [1:Partial Thickness] [  N/A:N/A] Exudate Amount: [1:Medium] [N/A:N/A] Exudate Type: [1:Serous] [N/A:N/A] Exudate Color: [1:amber] [N/A:N/A] Wound Margin: [1:Distinct, outline attached] [N/A:N/A] Granulation Amount: [1:Large (67-100%)] [N/A:N/A] Granulation Quality: [1:Pink] [N/A:N/A] Necrotic Amount: [1:None Present (0%)] [N/A:N/A] Exposed Structures: [1:Fascia: No Fat Layer (Subcutaneous Tissue) Exposed: No Tendon: No Muscle: No Joint: No Bone: No] [N/A:N/A] Limited to Skin Breakdown Epithelialization: Medium (34-66%) N/A N/A Periwound Skin Texture: Scarring: Yes N/A N/A Excoriation: No Induration: No Callus: No Crepitus: No Rash: No Periwound Skin Maceration: No N/A N/A Moisture: Dry/Scaly: No Periwound Skin Color: Atrophie Blanche: No N/A N/A Cyanosis: No Ecchymosis: No Erythema: No Hemosiderin Staining: No Mottled: No Pallor: No Rubor: No Temperature: No Abnormality N/A N/A Tenderness on Yes N/A N/A Palpation: Wound Preparation: Ulcer Cleansing: N/A N/A Rinsed/Irrigated with Saline Topical Anesthetic Applied: None Treatment Notes Wound #1 (Left Axilla) 1. Cleansed with: Clean wound with Normal Saline 4. Dressing Applied: Prisma Ag Notes coverlette Electronic Signature(s) Signed: 08/05/2016 2:28:48 PM By: Christin Fudge MD, FACS Entered By: Christin Fudge on 08/05/2016  14:28:48 Kristin Mora ([id number]) -------------------------------------------------------------------------------- [place] Details Patient Name: Kristin Mora Date of Service: 08/05/2016 2:15 PM Medical Record Number: [id number] Patient Account Number: [id number] Date of Birth/Sex: July 30, 1955 (61 y.o. Female) Treating RN: Afful, RN, BSN, Velva Harman Primary Care Jaidalyn Schillo: Carmon Ginsberg Other Clinician: Referring Charrise Lardner: Carmon Ginsberg Treating Callaghan Laverdure/Extender: Frann Rider in Treatment: 2 Active Inactive ` Orientation to the Wound Care Program Nursing Diagnoses: Knowledge deficit related to the wound healing center program Goals: Patient/caregiver will verbalize understanding of the [facility] Date Initiated: 07/21/2016 Target Resolution Date: 08/01/2016 Goal Status: Active Interventions: Provide education on orientation to the wound center Notes: ` Soft Tissue Infection Nursing Diagnoses: Impaired tissue integrity Knowledge deficit related to home infection control: handwashing, handling of soiled dressings, supply storage Potential for infection: soft tissue Goals: Patient will remain free of wound infection Date Initiated: 07/21/2016 Target Resolution Date: 08/01/2016 Goal Status: Active Patient/caregiver will verbalize understanding of or measures to prevent infection and contamination in the home setting Date Initiated: 07/21/2016 Target Resolution Date: 08/01/2016 Goal Status: Active Signs and symptoms of infection will be recognized early to allow for prompt treatment Date Initiated: 07/21/2016 Target Resolution Date: 08/01/2016 Goal Status: Active AFSA, BRUNKEN ([id number]) Interventions: Assess signs and symptoms of infection every visit Treatment Activities: Systemic antibiotics : 07/21/2016 Notes: ` Wound/Skin Impairment Nursing Diagnoses: Impaired tissue integrity Goals: Ulcer/skin breakdown will have a  volume reduction of 30% by week 4 Date Initiated: 07/21/2016 Target Resolution Date: 08/18/2016 Goal Status: Active Interventions: Assess patient/caregiver ability to obtain necessary supplies Assess ulceration(s) every visit Notes: Electronic Signature(s) Signed: 08/05/2016 3:41:55 PM By: Regan Lemming BSN, RN Entered By: Regan Lemming on 08/05/2016 14:18:28 Kristin Mora ([id number]) -------------------------------------------------------------------------------- Pain Assessment Details Patient Name: Kristin Mora Date of Service: 08/05/2016 2:15 PM Medical Record Number: [id number] Patient Account Number: [id number] Date of Birth/Sex: 20-Jul-1955 (61 y.o. Female) Treating RN: Baruch Gouty, RN, BSN, Velva Harman Primary Care Shareeka Yim: Carmon Ginsberg Other Clinician: Referring Korynn Kenedy: Carmon Ginsberg Treating Danasia Baker/Extender: Frann Rider in Treatment: 2 Active Problems Location of Pain Severity and Description of Pain Patient Has Paino No Site Locations With Dressing Change: No Pain Management and Medication Current Pain Management: Electronic Signature(s) Signed: 08/05/2016 2:11:52 PM By: Regan Lemming BSN, RN Entered By: Regan Lemming on 08/05/2016 14:11:52 Kristin Mora ([id number]) -------------------------------------------------------------------------------- Patient/Caregiver Education Details Patient Name: Kristin Mora Date of Service: 08/05/2016 2:15 PM Medical Record Number: [id number] Patient Account Number: [id number] Date of Birth/Gender: [birth year] (  61 y.o. Female) Treating RN: Baruch Gouty, RN, BSN, Velva Harman Primary Care Physician: Carmon Ginsberg Other Clinician: Referring Physician: Carmon Ginsberg Treating Physician/Extender: Frann Rider in Treatment: 2 Education Assessment Education Provided To: Patient Education Topics Provided Basic Hygiene: Methods: Explain/Verbal Responses: State content correctly Welcome To [facility]: Methods:  Explain/Verbal Responses: State content correctly Electronic Signature(s) Signed: 08/05/2016 3:41:55 PM By: Regan Lemming BSN, RN Entered By: Regan Lemming on 08/05/2016 14:28:27 Kristin Mora ([id number]) -------------------------------------------------------------------------------- Wound Assessment Details Patient Name: Kristin Mora Date of Service: 08/05/2016 2:15 PM Medical Record Number: [id number] Patient Account Number: [id number] Date of Birth/Sex: 04/01/1956 (61 y.o. Female) Treating RN: Afful, RN, BSN, [place] Primary Care Rankin Coolman: Carmon Ginsberg Other Clinician: Referring Earle Burson: Carmon Ginsberg Treating Dacie Mandel/Extender: Frann Rider in Treatment: 2 Wound Status Wound Number: 1 Primary Etiology: Abscess Wound Location: Left Axilla Wound Status: Open Wounding Event: Gradually Appeared Comorbid History: Hypertension, Type II Diabetes Date Acquired: 05/23/2016 Weeks Of Treatment: 2 Clustered Wound: No Photos Photo Uploaded By: Regan Lemming on 08/05/2016 15:37:02 Wound Measurements Length: (cm) 0.9 Width: (cm) 1.6 Depth: (cm) 0.1 Area: (cm) 1.131 Volume: (cm) 0.113 % Reduction in Area: 28% % Reduction in Volume: 28% Epithelialization: Medium (34-66%) Tunneling: No Undermining: No Wound Description Classification: Partial Thickness Foul Odor Aft Wound Margin: Distinct, outline attached Slough/Fibrin Exudate Amount: Medium Exudate Type: Serous Exudate Color: amber er Cleansing: No o No Wound Bed Granulation Amount: Large (67-100%) Exposed Structure Granulation Quality: Pink Fascia Exposed: No Necrotic Amount: None Present (0%) Fat Layer (Subcutaneous Tissue) Exposed: No Tendon Exposed: No LATIGRA, MIKRUT ([id number]) Muscle Exposed: No Joint Exposed: No Bone Exposed: No Limited to Skin Breakdown Periwound Skin Texture Texture Color No Abnormalities Noted: No No Abnormalities Noted: No Callus: No Atrophie Blanche: No Crepitus:  No Cyanosis: No Excoriation: No Ecchymosis: No Induration: No Erythema: No Rash: No Hemosiderin Staining: No Scarring: Yes Mottled: No Pallor: No Moisture Rubor: No No Abnormalities Noted: No Dry / Scaly: No Temperature / Pain Maceration: No Temperature: No Abnormality Tenderness on Palpation: Yes Wound Preparation Ulcer Cleansing: Rinsed/Irrigated with Saline Topical Anesthetic Applied: None Treatment Notes Wound #1 (Left Axilla) 1. Cleansed with: Clean wound with Normal Saline 4. Dressing Applied: Prisma Ag Notes coverlette Electronic Signature(s) Signed: 08/05/2016 3:41:55 PM By: Regan Lemming BSN, RN Entered By: Regan Lemming on 08/05/2016 14:18:15 Kristin Mora ([id number]) -------------------------------------------------------------------------------- Vitals Details Patient Name: Kristin Mora Date of Service: 08/05/2016 2:15 PM Medical Record Number: [id number] Patient Account Number: [id number] Date of Birth/Sex: 1956/01/08 (61 y.o. Female) Treating RN: Afful, RN, BSN, [place] [facility] Primary Care Toluwanimi Radebaugh: Carmon Ginsberg Other Clinician: Referring Tyee Vandevoorde: Carmon Ginsberg Treating Ramona Slinger/Extender: Frann Rider in Treatment: 2 Vital Signs Time Taken: 14:15 Temperature (F): 98.2 Height (in): 62 Respiratory Rate (breaths/min): 16 Weight (lbs): 296 Blood Pressure (mmHg): 194/96 Body Mass Index (BMI): 54.1 Reference Range: 80 - 120 mg / dl Electronic Signature(s) Signed: 08/05/2016 3:41:55 PM By: Regan Lemming BSN, RN Entered By: Regan Lemming on 08/05/2016 14:15:47

## 2016-08-06 NOTE — Progress Notes (Signed)
AVA, FRADKIN ([id number]) Visit Report for 08/05/2016 Chief Complaint Document Details Patient Name: Kristin Mora, Kristin Mora. Date of Service: 08/05/2016 2:15 PM Medical Record Number: [id number] Patient Account Number: [id number] Date of Birth/Sex: 06-11-1956 (61 y.o. Female) Treating RN: Baruch Gouty, RN, BSN, Velva Harman Primary Care Provider: Carmon Ginsberg Other Clinician: Referring Provider: Carmon Ginsberg Treating Provider/Extender: Frann Rider in Treatment: 2 Information Obtained from: Patient Chief Complaint Patient seen for complaints of Non-Healing Wound the left axillary area which she's had for at least 2 months Electronic Signature(s) Signed: 08/05/2016 2:28:58 PM By: Christin Fudge MD, FACS Entered By: Christin Fudge on 08/05/2016 14:28:58 Kristin Mora ([id number]) -------------------------------------------------------------------------------- HPI Details Patient Name: Kristin Mora Date of Service: 08/05/2016 2:15 PM Medical Record Number: [id number] Patient Account Number: [id number] Date of Birth/Sex: May 10, 1956 (61 y.o. Female) Treating RN: Baruch Gouty, RN, BSN, Velva Harman Primary Care Provider: Carmon Ginsberg Other Clinician: Referring Provider: Carmon Ginsberg Treating Provider/Extender: Frann Rider in Treatment: 2 History of Present Illness Location: after axillary area Quality: Patient reports No Pain. Severity: Patient states wound (s) are getting better. Duration: Patient has had the wound for > 2 months prior to seeking treatment at the wound center Context: The wound appeared gradually over time Modifying Factors: Other treatment(s) tried include:doxycycline and clindamycin gel Associated Signs and Symptoms: Patient reports having foul odor. HPI Description: 61 year old patient who has a past medical history significant for diabetes mellitus, essential hypertension, COPD, CVA, axillary hidradenitis repetitive, vertigo, recently presented to the emergency  department with hydradenitis suppurtiva, which is normally managed by her PCP and currently takes doxycycline. she is a former smoker and quit in 2011. ER advised her clindamycin gel locally and to follow-up with the wound center. asked hemoglobin A1c done 3 weeks ago was 6.5%. 07/28/2016 -- her dermatology appointment is not until the middle of March. 08/05/2016 -- the patient has been doing the dressing as planned and has no fresh issue. Electronic Signature(s) Signed: 08/05/2016 2:29:52 PM By: Christin Fudge MD, FACS Entered By: Christin Fudge on 08/05/2016 14:29:52 Kristin Mora ([id number]) -------------------------------------------------------------------------------- Physical Exam Details Patient Name: Kristin Mora Date of Service: 08/05/2016 2:15 PM Medical Record Number: [id number] Patient Account Number: [id number] Date of Birth/Sex: 05/09/1956 (61 y.o. Female) Treating RN: Baruch Gouty, RN, BSN, Velva Harman Primary Care Provider: Carmon Ginsberg Other Clinician: Referring Provider: Carmon Ginsberg Treating Provider/Extender: Frann Rider in Treatment: 2 Constitutional . Pulse regular. Respirations normal and unlabored. Afebrile. . Eyes Nonicteric. Reactive to light. Ears, Nose, Mouth, and Throat Lips, teeth, and gums WNL.Marland Kitchen Moist mucosa without lesions. Neck supple and nontender. No palpable supraclavicular or cervical adenopathy. Normal sized without goiter. Respiratory WNL. No retractions.. Breath sounds WNL, No rubs, rales, rhonchi, or wheeze.. Cardiovascular Heart rhythm and rate regular, no murmur or gallop.. Pedal Pulses WNL. No clubbing, cyanosis or edema. Chest Breasts symmetical and no nipple discharge.. Breast tissue WNL, no masses, lumps, or tenderness.. Gastrointestinal (GI) Abdomen without masses or tenderness.. No liver or spleen enlargement or tenderness.. Genitourinary (GU) No hydrocele, spermatocele, tenderness of the cord, or testicular mass.Marland Kitchen Penis  without lesions.Lowella Fairy without lesions. No cystocele, or rectocele. Pelvic support intact, no discharge.Marland Kitchen Urethra without masses, tenderness or scarring.Marland Kitchen Lymphatic No adneopathy. No adenopathy. No adenopathy. Musculoskeletal Adexa without tenderness or enlargement.. Digits and nails w/o clubbing, cyanosis, infection, petechiae, ischemia, or inflammatory conditions.. Integumentary (Hair, Skin) No suspicious lesions. No crepitus or fluctuance. No peri-wound warmth or erythema. No masses.Marland Kitchen Psychiatric Judgement and insight Intact.. No evidence of depression, anxiety, or agitation.. Notes the  wound looks good has no surrounding problems and that is clean granulation tissue Electronic Signature(s) Signed: 08/05/2016 2:33:20 PM By: Christin Fudge MD, FACS Kristin Mora ([id number]) Entered By: Christin Fudge on 08/05/2016 14:33:20 Kristin Mora ([id number]) -------------------------------------------------------------------------------- Physician Orders Details Patient Name: Kristin Mora Date of Service: 08/05/2016 2:15 PM Medical Record Number: [id number] Patient Account Number: [id number] Date of Birth/Sex: Aug 14, 1955 (61 y.o. Female) Treating RN: Baruch Gouty, RN, BSN, Velva Harman Primary Care Provider: Carmon Ginsberg Other Clinician: Referring Provider: Carmon Ginsberg Treating Provider/Extender: Frann Rider in Treatment: 2 Verbal / Phone Orders: No Diagnosis Coding Wound Cleansing o Clean wound with Normal Saline. o Cleanse wound with mild soap and water Primary Wound Dressing Wound #1 Left Axilla o Prisma Ag Secondary Dressing Wound #1 Left Axilla o Other - coverlet Dressing Change Frequency Wound #1 Left Axilla o Change dressing every other day. Follow-up Appointments Wound #1 Left Axilla o Return Appointment in 1 week. Electronic Signature(s) Signed: 08/05/2016 3:35:58 PM By: Christin Fudge MD, FACS Signed: 08/05/2016 3:41:55 PM By: Regan Lemming BSN,  RN Entered By: Regan Lemming on 08/05/2016 14:24:37 Kristin Mora ([id number]) -------------------------------------------------------------------------------- Problem List Details Patient Name: ELVIS, ARRELLANO. Date of Service: 08/05/2016 2:15 PM Medical Record Number: [id number] Patient Account Number: [id number] Date of Birth/Sex: 1955-08-20 (61 y.o. Female) Treating RN: Baruch Gouty, RN, BSN, Velva Harman Primary Care Provider: Carmon Ginsberg Other Clinician: Referring Provider: Carmon Ginsberg Treating Provider/Extender: Frann Rider in Treatment: 2 Active Problems ICD-10 Encounter Code Description Active Date Diagnosis E11.622 Type 2 diabetes mellitus with other skin ulcer 07/21/2016 Yes L73.2 Hidradenitis suppurativa 07/21/2016 Yes S21.90XA Unspecified open wound of unspecified part of thorax, 07/21/2016 Yes initial encounter Inactive Problems Resolved Problems Electronic Signature(s) Signed: 08/05/2016 2:28:44 PM By: Christin Fudge MD, FACS Entered By: Christin Fudge on 08/05/2016 14:28:44 Kristin Mora ([id number]) -------------------------------------------------------------------------------- Progress Note Details Patient Name: Kristin Mora Date of Service: 08/05/2016 2:15 PM Medical Record Number: [id number] Patient Account Number: [id number] Date of Birth/Sex: 1956-05-28 (61 y.o. Female) Treating RN: Afful, RN, BSN, Velva Harman Primary Care Provider: Carmon Ginsberg Other Clinician: Referring Provider: Carmon Ginsberg Treating Provider/Extender: Frann Rider in Treatment: 2 Subjective Chief Complaint Information obtained from Patient Patient seen for complaints of Non-Healing Wound the left axillary area which she's had for at least 2 months History of Present Illness (HPI) The following HPI elements were documented for the patient's wound: Location: after axillary area Quality: Patient reports No Pain. Severity: Patient states wound (s) are getting  better. Duration: Patient has had the wound for > 2 months prior to seeking treatment at the wound center Context: The wound appeared gradually over time Modifying Factors: Other treatment(s) tried include:doxycycline and clindamycin gel Associated Signs and Symptoms: Patient reports having foul odor. 61 year old patient who has a past medical history significant for diabetes mellitus, essential hypertension, COPD, CVA, axillary hidradenitis repetitive, vertigo, recently presented to the emergency department with hydradenitis suppurtiva, which is normally managed by her PCP and currently takes doxycycline. she is a former smoker and quit in 2011. ER advised her clindamycin gel locally and to follow-up with the wound center. asked hemoglobin A1c done 3 weeks ago was 6.5%. 07/28/2016 -- her dermatology appointment is not until the middle of March. 08/05/2016 -- the patient has been doing the dressing as planned and has no fresh issue. Objective Constitutional Pulse regular. Respirations normal and unlabored. Afebrile. Vitals Time Taken: 2:15 PM, Height: 62 in, Weight: 296 lbs, BMI: 54.1, Temperature: 98.2 F, Respiratory Rate: 16 breaths/min, Blood Pressure:  194/96 mmHg. CARLYLE, SIMEON ([id number]) Eyes Nonicteric. Reactive to light. Ears, Nose, Mouth, and Throat Lips, teeth, and gums WNL.Marland Kitchen Moist mucosa without lesions. Neck supple and nontender. No palpable supraclavicular or cervical adenopathy. Normal sized without goiter. Respiratory WNL. No retractions.. Breath sounds WNL, No rubs, rales, rhonchi, or wheeze.. Cardiovascular Heart rhythm and rate regular, no murmur or gallop.. Pedal Pulses WNL. No clubbing, cyanosis or edema. Chest Breasts symmetical and no nipple discharge.. Breast tissue WNL, no masses, lumps, or tenderness.. Gastrointestinal (GI) Abdomen without masses or tenderness.. No liver or spleen enlargement or tenderness.. Genitourinary (GU) No hydrocele,  spermatocele, tenderness of the cord, or testicular mass.Marland Kitchen Penis without lesions.Lowella Fairy without lesions. No cystocele, or rectocele. Pelvic support intact, no discharge.Marland Kitchen Urethra without masses, tenderness or scarring.Marland Kitchen Lymphatic No adneopathy. No adenopathy. No adenopathy. Musculoskeletal Adexa without tenderness or enlargement.. Digits and nails w/o clubbing, cyanosis, infection, petechiae, ischemia, or inflammatory conditions.Marland Kitchen Psychiatric Judgement and insight Intact.. No evidence of depression, anxiety, or agitation.. General Notes: the wound looks good has no surrounding problems and that is clean granulation tissue Integumentary (Hair, Skin) No suspicious lesions. No crepitus or fluctuance. No peri-wound warmth or erythema. No masses.. Wound #1 status is Open. Original cause of wound was Gradually Appeared. The wound is located on the Left Axilla. The wound measures 0.9cm length x 1.6cm width x 0.1cm depth; 1.131cm^2 area and 0.113cm^3 volume. The wound is limited to skin breakdown. There is no tunneling or undermining noted. There is a medium amount of serous drainage noted. The wound margin is distinct with the outline attached to the wound base. There is large (67-100%) pink granulation within the wound bed. There is no necrotic tissue within the wound bed. The periwound skin appearance exhibited: Scarring. The periwound skin appearance did not exhibit: Callus, Crepitus, Excoriation, Induration, Rash, Dry/Scaly, Maceration, Atrophie Blanche, Cyanosis, Ecchymosis, Hemosiderin Staining, Mottled, Pallor, Rubor, Erythema. LORANE, TOMASINO ([id number]) temperature was noted as No Abnormality. The periwound has tenderness on palpation. Assessment Active Problems ICD-10 E11.622 - Type 2 diabetes mellitus with other skin ulcer L73.2 - Hidradenitis suppurativa S21.90XA - Unspecified open wound of unspecified part of thorax, initial encounter Plan Wound Cleansing: Clean  wound with Normal Saline. Cleanse wound with mild soap and water Primary Wound Dressing: Wound #1 Left Axilla: Prisma Ag Secondary Dressing: Wound #1 Left Axilla: Other - coverlet Dressing Change Frequency: Wound #1 Left Axilla: Change dressing every other day. Follow-up Appointments: Wound #1 Left Axilla: Return Appointment in 1 week. I have recommended; 1. Silver collagen with a bordered foam to be applied every other day. 2. good control of her diabetes mellitus 3. Follow-up with her dermatology appointment 4. Review by me next week SABRYN, LAFORTE ([id number]) Electronic Signature(s) Signed: 08/05/2016 2:44:06 PM By: Christin Fudge MD, FACS Entered By: Christin Fudge on 08/05/2016 14:44:06 Kristin Mora ([id number]) -------------------------------------------------------------------------------- SuperBill Details Patient Name: Kristin Mora Date of Service: 08/05/2016 Medical Record Number: [id number] Patient Account Number: [id number] Date of Birth/Sex: [birth year] (61 y.o. Female) Treating RN: Baruch Gouty, RN, BSN, Velva Harman Primary Care Provider: Carmon Ginsberg Other Clinician: Referring Provider: Carmon Ginsberg Treating Provider/Extender: Christin Fudge Service Line: Outpatient Weeks in Treatment: 2 Diagnosis Coding ICD-10 Codes Code Description [phone] Type 2 diabetes mellitus with other skin ulcer L73.2 Hidradenitis suppurativa S21.90XA Unspecified open wound of unspecified part of thorax, initial encounter Facility Procedures CPT4 Code: [id number] Description: [id number] - WOUND CARE VISIT-LEV 2 EST PT Modifier: Quantity: 1 Physician Procedures CPT4 Code Description: [id number] - WC PHYS  LEVEL 3 - EST PT ICD-10 Description Diagnosis E11.622 Type 2 diabetes mellitus with other skin ulcer L73.2 Hidradenitis suppurativa S21.90XA Unspecified open wound of unspecified part of thor Modifier: ax, initial en Quantity: 1 counter Electronic Signature(s) Signed: 08/05/2016 2:44:19  PM By: Christin Fudge MD, FACS Entered By: Christin Fudge on 08/05/2016 14:44:19

## 2016-08-11 ENCOUNTER — Encounter: Payer: Self-pay | Admitting: Family Medicine

## 2016-08-12 ENCOUNTER — Encounter: Payer: Self-pay | Admitting: Surgery

## 2016-08-13 NOTE — Progress Notes (Signed)
Kristin Mora, Kristin Mora ([id number]) Visit Report for 08/12/2016 Arrival Information Details Patient Name: Kristin Mora, Kristin Mora. Date of Service: 08/12/2016 3:00 PM Medical Record Number: [id number] Patient Account Number: [id number] Date of Birth/Sex: May 22, 1956 (61 Kristin Mora.o. Female) Treating RN: Cornell Barman Primary Care Francheska Villeda: Carmon Ginsberg Other Clinician: Referring Shernell Saldierna: Carmon Ginsberg Treating Lonni Dirden/Extender: Frann Rider in Treatment: 3 Visit Information History Since Last Visit Added or deleted any medications: No Patient Arrived: Ambulatory Any new allergies or adverse reactions: No Arrival Time: 15:13 Had a fall or experienced change in No Accompanied By: self activities of daily living that may affect Transfer Assistance: None risk of falls: Patient Identification Verified: Yes Signs or symptoms of abuse/neglect since last No Secondary Verification Process Yes visito Completed: Hospitalized since last visit: No Patient Requires Transmission- No Has Dressing in Place as Prescribed: Yes Based Precautions: Pain Present Now: No Patient Has Alerts: Yes Patient Alerts: Type II Diabetic Electronic Signature(s) Signed: 08/12/2016 4:57:49 PM By: Gretta Cool, RN, BSN, Kim RN, BSN Entered By: Gretta Cool, RN, BSN, Kim on 08/12/2016 15:14:32 Kristin Mora ([id number]) -------------------------------------------------------------------------------- Clinic Level of Care Assessment Details Patient Name: Kristin Mora. Date of Service: 08/12/2016 3:00 PM Medical Record Number: [id number] Patient Account Number: [id number] Date of Birth/Sex: [birth year] (61 Kristin Mora.o. Female) Treating RN: Cornell Barman Primary Care Cloyce Blankenhorn: Carmon Ginsberg Other Clinician: Referring Jasa Dundon: Carmon Ginsberg Treating Rhylin Venters/Extender: Frann Rider in Treatment: 3 Clinic Level of Care Assessment Items TOOL 4 Quantity Score []  - Use when only an EandM is performed on FOLLOW-UP visit 0 ASSESSMENTS -  Nursing Assessment / Reassessment []  - Reassessment of Co-morbidities (includes updates in patient status) 0 X - Reassessment of Adherence to Treatment Plan 1 5 ASSESSMENTS - Wound and Skin Assessment / Reassessment X - Simple Wound Assessment / Reassessment - one wound 1 5 []  - Complex Wound Assessment / Reassessment - multiple wounds 0 []  - Dermatologic / Skin Assessment (not related to wound area) 0 ASSESSMENTS - Focused Assessment []  - Circumferential Edema Measurements - multi extremities 0 []  - Nutritional Assessment / Counseling / Intervention 0 []  - Lower Extremity Assessment (monofilament, tuning fork, pulses) 0 []  - Peripheral Arterial Disease Assessment (using hand held doppler) 0 ASSESSMENTS - Ostomy and/or Continence Assessment and Care []  - Incontinence Assessment and Management 0 []  - Ostomy Care Assessment and Management (repouching, etc.) 0 PROCESS - Coordination of Care X - Simple Patient / Family Education for ongoing care 1 15 []  - Complex (extensive) Patient / Family Education for ongoing care 0 []  - Staff obtains Programmer, systems, Records, Test Results / Process Orders 0 []  - Staff telephones HHA, Nursing Homes / Clarify orders / etc 0 []  - Routine Transfer to another Facility (non-emergent condition) 0 Kristin Mora, Kristin Mora ([id number]) []  - Routine Hospital Admission (non-emergent condition) 0 []  - New Admissions / Biomedical engineer / Ordering NPWT, Apligraf, etc. 0 []  - Emergency Hospital Admission (emergent condition) 0 X - Simple Discharge Coordination 1 10 []  - Complex (extensive) Discharge Coordination 0 PROCESS - Special Needs []  - Pediatric / Minor Patient Management 0 []  - Isolation Patient Management 0 []  - Hearing / Language / Visual special needs 0 []  - Assessment of Community assistance (transportation, D/C planning, etc.) 0 []  - Additional assistance / Altered mentation 0 []  - Support Surface(s) Assessment (bed, cushion, seat, etc.) 0 INTERVENTIONS -  Wound Cleansing / Measurement X - Simple Wound Cleansing - one wound 1 5 []  - Complex Wound Cleansing - multiple wounds 0 X - Wound Imaging (  photographs - any number of wounds) 1 5 []  - Wound Tracing (instead of photographs) 0 X - Simple Wound Measurement - one wound 1 5 []  - Complex Wound Measurement - multiple wounds 0 INTERVENTIONS - Wound Dressings X - Small Wound Dressing one or multiple wounds 1 10 []  - Medium Wound Dressing one or multiple wounds 0 []  - Large Wound Dressing one or multiple wounds 0 []  - Application of Medications - topical 0 []  - Application of Medications - injection 0 INTERVENTIONS - Miscellaneous []  - External ear exam 0 Kristin Mora, Kristin Mora ([id number]) []  - Specimen Collection (cultures, biopsies, blood, body fluids, etc.) 0 []  - Specimen(s) / Culture(s) sent or taken to Lab for analysis 0 []  - Patient Transfer (multiple staff / Harrel Lemon Lift / Similar devices) 0 []  - Simple Staple / Suture removal (25 or less) 0 []  - Complex Staple / Suture removal (26 or more) 0 []  - Hypo / Hyperglycemic Management (close monitor of Blood Glucose) 0 []  - Ankle / Brachial Index (ABI) - do not check if billed separately 0 X - Vital Signs 1 5 Has the patient been seen at the hospital within the last three years: Yes Total Score: 65 Level Of Care: New/Established - Level 2 Electronic Signature(s) Signed: 08/12/2016 4:57:49 PM By: Gretta Cool, RN, BSN, Kim RN, BSN Entered By: Gretta Cool, RN, BSN, Kim on 08/12/2016 15:28:27 Kristin Mora ([id number]) -------------------------------------------------------------------------------- Encounter Discharge Information Details Patient Name: Kristin Mora. Date of Service: 08/12/2016 3:00 PM Medical Record Number: [id number] Patient Account Number: [id number] Date of Birth/Sex: [birth year] (61 Kristin Mora.o. Female) Treating RN: Cornell Barman Primary Care Alastor Kneale: Carmon Ginsberg Other Clinician: Referring Myana Schlup: Carmon Ginsberg Treating  Landen Knoedler/Extender: Frann Rider in Treatment: 3 Encounter Discharge Information Items Discharge Pain Level: 0 Discharge Condition: Stable Ambulatory Status: Ambulatory Discharge Destination: Home Transportation: Private Auto Accompanied By: self Schedule Follow-up Appointment: Yes Medication Reconciliation completed and provided to Patient/Care Yes Antwain Caliendo: Provided on Clinical Summary of Care: 08/12/2016 Form Type Recipient Paper Patient JP Electronic Signature(s) Signed: 08/12/2016 4:57:49 PM By: Gretta Cool RN, BSN, Kim RN, BSN Previous Signature: 08/12/2016 3:28:45 PM Version By: Ruthine Dose Entered By: Gretta Cool RN, BSN, Kim on 08/12/2016 15:29:14 Kristin Mora ([id number]) -------------------------------------------------------------------------------- Lower Extremity Assessment Details Patient Name: Kristin Mora. Date of Service: 08/12/2016 3:00 PM Medical Record Number: [id number] Patient Account Number: [id number] Date of Birth/Sex: Jan 06, 1956 (61 Kristin Mora.o. Female) Treating RN: Cornell Barman Primary Care Laksh Hinners: Carmon Ginsberg Other Clinician: Referring Henretta Quist: Carmon Ginsberg Treating Culver Feighner/Extender: Frann Rider in Treatment: 3 Electronic Signature(s) Signed: 08/12/2016 4:57:49 PM By: Gretta Cool, RN, BSN, Kim RN, BSN Entered By: Gretta Cool, RN, BSN, Kim on 08/12/2016 15:19:31 Kristin Mora ([id number]) -------------------------------------------------------------------------------- Multi Wound Chart Details Patient Name: Kristin Mora Date of Service: 08/12/2016 3:00 PM Medical Record Number: [id number] Patient Account Number: [id number] Date of Birth/Sex: Jul 10, 1955 (61 Kristin Mora.o. Female) Treating RN: Cornell Barman Primary Care Yusef Lamp: Carmon Ginsberg Other Clinician: Referring Grayson Pfefferle: Carmon Ginsberg Treating Dontea Corlew/Extender: Frann Rider in Treatment: 3 Vital Signs Height(in): 62 Pulse(bpm): Weight(lbs): 296 Blood  Pressure 158/88 (mmHg): Body Mass Index(BMI): 54 Temperature(F): 98.1 Respiratory Rate 16 (breaths/min): Photos: [N/A:N/A] Wound Location: Left Axilla N/A N/A Wounding Event: Gradually Appeared N/A N/A Primary Etiology: Abscess N/A N/A Comorbid History: Hypertension, Type II N/A N/A Diabetes Date Acquired: 05/23/2016 N/A N/A Weeks of Treatment: 3 N/A N/A Wound Status: Open N/A N/A Measurements L x W x D 0.4x0.8x0.1 N/A N/A (cm) Area (cm) : 0.251 N/A N/A Volume (cm) : 0.025 N/A  N/A % Reduction in Area: 84.00% N/A N/A % Reduction in Volume: 84.10% N/A N/A Classification: Partial Thickness N/A N/A Exudate Amount: Medium N/A N/A Exudate Type: Serous N/A N/A Exudate Color: amber N/A N/A Wound Margin: Distinct, outline attached N/A N/A Granulation Amount: Large (67-100%) N/A N/A Granulation Quality: Red, Friable N/A N/A Necrotic Amount: None Present (0%) N/A N/A Exposed Structures: Fascia: No N/A N/A Fat Layer (Subcutaneous SHARLITA, FELTER ([id number]) Tissue) Exposed: No Tendon: No Muscle: No Joint: No Bone: No Limited to Skin Breakdown Epithelialization: Medium (34-66%) N/A N/A Periwound Skin Texture: Excoriation: Yes N/A N/A Scarring: Yes Induration: No Callus: No Crepitus: No Rash: No Periwound Skin Maceration: No N/A N/A Moisture: Dry/Scaly: No Periwound Skin Color: Atrophie Blanche: No N/A N/A Cyanosis: No Ecchymosis: No Erythema: No Hemosiderin Staining: No Mottled: No Pallor: No Rubor: No Temperature: No Abnormality N/A N/A Tenderness on Yes N/A N/A Palpation: Wound Preparation: Ulcer Cleansing: N/A N/A Rinsed/Irrigated with Saline Topical Anesthetic Applied: None Treatment Notes Wound #1 (Left Axilla) 1. Cleansed with: Clean wound with Normal Saline 4. Dressing Applied: Prisma Ag 7. Secured with Self adhesive bandage Notes coverlette Electronic Signature(s) Signed: 08/12/2016 3:51:24 PM By: Christin Fudge MD, FACS Kristin Mora ([id number]) Entered By: Christin Fudge on 08/12/2016 15:51:23 Kristin Mora ([id number]) -------------------------------------------------------------------------------- [place] Details Patient Name: Kristin Mora, Kristin Mora. Date of Service: 08/12/2016 3:00 PM Medical Record Number: [id number] Patient Account Number: [id number] Date of Birth/Sex: February 26, 1956 (61 Kristin Mora.o. Female) Treating RN: Cornell Barman Primary Care Takina Busser: Carmon Ginsberg Other Clinician: Referring Romulus Hanrahan: Carmon Ginsberg Treating Berta Denson/Extender: Frann Rider in Treatment: 3 Active Inactive ` Orientation to the Wound Care Program Nursing Diagnoses: Knowledge deficit related to the wound healing center program Goals: Patient/caregiver will verbalize understanding of the [place] Program Date Initiated: 07/21/2016 Target Resolution Date: 08/01/2016 Goal Status: Active Interventions: Provide education on orientation to the wound center Notes: ` Soft Tissue Infection Nursing Diagnoses: Impaired tissue integrity Knowledge deficit related to home infection control: handwashing, handling of soiled dressings, supply storage Potential for infection: soft tissue Goals: Patient will remain free of wound infection Date Initiated: 07/21/2016 Target Resolution Date: 08/01/2016 Goal Status: Active Patient/caregiver will verbalize understanding of or measures to prevent infection and contamination in the home setting Date Initiated: 07/21/2016 Target Resolution Date: 08/01/2016 Goal Status: Active Signs and symptoms of infection will be recognized early to allow for prompt treatment Date Initiated: 07/21/2016 Target Resolution Date: 08/01/2016 Goal Status: Active Kristin Mora, Kristin Mora ([id number]) Interventions: Assess signs and symptoms of infection every visit Treatment Activities: Systemic antibiotics : 07/21/2016 Notes: ` Wound/Skin Impairment Nursing Diagnoses: Impaired tissue  integrity Goals: Ulcer/skin breakdown will have a volume reduction of 30% by week 4 Date Initiated: 07/21/2016 Target Resolution Date: 08/18/2016 Goal Status: Active Interventions: Assess patient/caregiver ability to obtain necessary supplies Assess ulceration(s) every visit Notes: Electronic Signature(s) Signed: 08/12/2016 4:57:49 PM By: Gretta Cool, RN, BSN, Kim RN, BSN Entered By: Gretta Cool, RN, BSN, Kim on 08/12/2016 15:21:40 Kristin Mora ([id number]) -------------------------------------------------------------------------------- Pain Assessment Details Patient Name: Kristin Mora Date of Service: 08/12/2016 3:00 PM Medical Record Number: [id number] Patient Account Number: [id number] Date of Birth/Sex: 28-May-1956 (61 Kristin Mora.o. Female) Treating RN: Cornell Barman Primary Care Cypress Hinkson: Carmon Ginsberg Other Clinician: Referring Isaia Hassell: Carmon Ginsberg Treating Chamaine Stankus/Extender: Frann Rider in Treatment: 3 Active Problems Location of Pain Severity and Description of Pain Patient Has Paino No Site Locations With Dressing Change: No Pain Management and Medication Current Pain Management: Electronic Signature(s) Signed: 08/12/2016 4:57:49 PM By:  Gretta Cool, RN, BSN, Leisure centre manager, BSN Entered By: Gretta Cool, RN, BSN, Kim on 08/12/2016 15:14:40 Kristin Mora ([id number]) -------------------------------------------------------------------------------- Patient/Caregiver Education Details Patient Name: Kristin Mora Date of Service: 08/12/2016 3:00 PM Medical Record Number: [id number] Patient Account Number: [id number] Date of Birth/Gender: 1956/01/02 (61 Kristin Mora.o. Female) Treating RN: Cornell Barman Primary Care Physician: Carmon Ginsberg Other Clinician: Referring Physician: Carmon Ginsberg Treating Physician/Extender: Frann Rider in Treatment: 3 Education Assessment Education Provided To: Patient Education Topics Provided Wound/Skin Impairment: Handouts: Caring for Your Ulcer, Other:  continue wound care as prescribed Methods: Demonstration, Explain/Verbal Responses: State content correctly Electronic Signature(s) Signed: 08/12/2016 4:57:49 PM By: Gretta Cool, RN, BSN, Kim RN, BSN Entered By: Gretta Cool, RN, BSN, Kim on 08/12/2016 15:29:33 Kristin Mora ([id number]) -------------------------------------------------------------------------------- Wound Assessment Details Patient Name: Kristin Mora. Date of Service: 08/12/2016 3:00 PM Medical Record Number: [id number] Patient Account Number: [id number] Date of Birth/Sex: Sep 15, 1955 (61 Kristin Mora.o. Female) Treating RN: Cornell Barman Primary Care Jesse Hirst: Carmon Ginsberg Other Clinician: Referring Miarose Lippert: Carmon Ginsberg Treating Arilynn Blakeney/Extender: Frann Rider in Treatment: 3 Wound Status Wound Number: 1 Primary Etiology: Abscess Wound Location: Left Axilla Wound Status: Open Wounding Event: Gradually Appeared Comorbid History: Hypertension, Type II Diabetes Date Acquired: 05/23/2016 Weeks Of Treatment: 3 Clustered Wound: No Photos Wound Measurements Length: (cm) 0.4 Width: (cm) 0.8 Depth: (cm) 0.1 Area: (cm) 0.251 Volume: (cm) 0.025 % Reduction in Area: 84% % Reduction in Volume: 84.1% Epithelialization: Medium (34-66%) Tunneling: No Undermining: No Wound Description Classification: Partial Thickness Foul Odor Aft Wound Margin: Distinct, outline attached Slough/Fibrin Exudate Amount: Medium Exudate Type: Serous Exudate Color: amber er Cleansing: No o No Wound Bed Granulation Amount: Large (67-100%) Exposed Structure Granulation Quality: Red, Friable Fascia Exposed: No Necrotic Amount: None Present (0%) Fat Layer (Subcutaneous Tissue) Exposed: No Tendon Exposed: No Muscle Exposed: No Joint Exposed: No Bone Exposed: No Kristin Mora, Kristin Mora ([id number]) Limited to Skin Breakdown Periwound Skin Texture Texture Color No Abnormalities Noted: No No Abnormalities Noted: No Callus: No Atrophie  Blanche: No Crepitus: No Cyanosis: No Excoriation: Yes Ecchymosis: No Induration: No Erythema: No Rash: No Hemosiderin Staining: No Scarring: Yes Mottled: No Pallor: No Moisture Rubor: No No Abnormalities Noted: No Dry / Scaly: No Temperature / Pain Maceration: No Temperature: No Abnormality Tenderness on Palpation: Yes Wound Preparation Ulcer Cleansing: Rinsed/Irrigated with Saline Topical Anesthetic Applied: None Treatment Notes Wound #1 (Left Axilla) 1. Cleansed with: Clean wound with Normal Saline 4. Dressing Applied: Prisma Ag 7. Secured with Self adhesive bandage Notes coverlette Electronic Signature(s) Signed: 08/12/2016 4:57:49 PM By: Gretta Cool, RN, BSN, Kim RN, BSN Entered By: Gretta Cool, RN, BSN, Kim on 08/12/2016 15:19:20 Kristin Mora ([id number]) -------------------------------------------------------------------------------- [place] Details Patient Name: Kristin Mora Date of Service: 08/12/2016 3:00 PM Medical Record Number: [id number] Patient Account Number: [id number] Date of Birth/Sex: Nov 17, 1955 (61 Kristin Mora.o. Female) Treating RN: Cornell Barman Primary Care Nguyen Butler: Carmon Ginsberg Other Clinician: Referring Annikah Lovins: Carmon Ginsberg Treating Jim Philemon/Extender: Frann Rider in Treatment: 3 Vital Signs Time Taken: 15:14 Temperature (F): 98.1 Height (in): 62 Respiratory Rate (breaths/min): 16 Weight (lbs): 296 Blood Pressure (mmHg): 158/88 Body Mass Index (BMI): 54.1 Reference Range: 80 - 120 mg / dl Electronic Signature(s) Signed: 08/12/2016 4:57:49 PM By: Gretta Cool, RN, BSN, Kim RN, BSN Entered By: Gretta Cool, RN, BSN, Kim on 08/12/2016 15:16:31

## 2016-08-13 NOTE — Progress Notes (Signed)
LASHANE, KELLOGG ([id number]) Visit Report for 08/12/2016 Chief Complaint Document Details Patient Name: Kristin Mora, Kristin Mora. Date of Service: 08/12/2016 3:00 PM Medical Record Number: [id number] Patient Account Number: [id number] Date of Birth/Sex: 1955-09-29 (61 y.o. Female) Treating RN: Cornell Barman Primary Care Provider: Carmon Ginsberg Other Clinician: Referring Provider: Carmon Ginsberg Treating Provider/Extender: Frann Rider in Treatment: 3 Information Obtained from: Patient Chief Complaint Patient seen for complaints of Non-Healing Wound the left axillary area which she's had for at least 2 months Electronic Signature(s) Signed: 08/12/2016 3:51:30 PM By: Christin Fudge MD, FACS Entered By: Christin Fudge on 08/12/2016 15:51:29 Kristin Mora ([id number]) -------------------------------------------------------------------------------- HPI Details Patient Name: Kristin Mora Date of Service: 08/12/2016 3:00 PM Medical Record Number: [id number] Patient Account Number: [id number] Date of Birth/Sex: 1955-07-09 (61 y.o. Female) Treating RN: Cornell Barman Primary Care Provider: Carmon Ginsberg Other Clinician: Referring Provider: Carmon Ginsberg Treating Provider/Extender: Frann Rider in Treatment: 3 History of Present Illness Location: after axillary area Quality: Patient reports No Pain. Severity: Patient states wound (s) are getting better. Duration: Patient has had the wound for > 2 months prior to seeking treatment at the wound center Context: The wound appeared gradually over time Modifying Factors: Other treatment(s) tried include:doxycycline and clindamycin gel Associated Signs and Symptoms: Patient reports having foul odor. HPI Description: 61 year old patient who has a past medical history significant for diabetes mellitus, essential hypertension, COPD, CVA, axillary hidradenitis repetitive, vertigo, recently presented to the emergency department with  hydradenitis suppurtiva, which is normally managed by her PCP and currently takes doxycycline. she is a former smoker and quit in 2011. ER advised her clindamycin gel locally and to follow-up with the wound center. asked hemoglobin A1c done 3 weeks ago was 6.5%. 07/28/2016 -- her dermatology appointment is not until the middle of March. 08/05/2016 -- the patient has been doing the dressing as planned and has no fresh issue. Electronic Signature(s) Signed: 08/12/2016 3:51:34 PM By: Christin Fudge MD, FACS Entered By: Christin Fudge on 08/12/2016 15:51:33 Kristin Mora ([id number]) -------------------------------------------------------------------------------- Physical Exam Details Patient Name: Kristin Mora Date of Service: 08/12/2016 3:00 PM Medical Record Number: [id number] Patient Account Number: [id number] Date of Birth/Sex: 03-19-1956 (61 y.o. Female) Treating RN: Cornell Barman Primary Care Provider: Carmon Ginsberg Other Clinician: Referring Provider: Carmon Ginsberg Treating Provider/Extender: Frann Rider in Treatment: 3 Constitutional . Pulse regular. Respirations normal and unlabored. Afebrile. . Eyes Nonicteric. Reactive to light. Ears, Nose, Mouth, and Throat Lips, teeth, and gums WNL.Marland Kitchen Moist mucosa without lesions. Neck supple and nontender. No palpable supraclavicular or cervical adenopathy. Normal sized without goiter. Respiratory WNL. No retractions.. Cardiovascular Pedal Pulses WNL. No clubbing, cyanosis or edema. Lymphatic No adneopathy. No adenopathy. No adenopathy. Musculoskeletal Adexa without tenderness or enlargement.. Digits and nails w/o clubbing, cyanosis, infection, petechiae, ischemia, or inflammatory conditions.. Integumentary (Hair, Skin) No suspicious lesions. No crepitus or fluctuance. No peri-wound warmth or erythema. No masses.Marland Kitchen Psychiatric Judgement and insight Intact.. No evidence of depression, anxiety, or agitation.. Notes the  wound looks excellent and has come down significantly in size and minimal open areas will be treated appropriately Electronic Signature(s) Signed: 08/12/2016 3:52:00 PM By: Christin Fudge MD, FACS Entered By: Christin Fudge on 08/12/2016 15:51:59 Kristin Mora ([id number]) -------------------------------------------------------------------------------- Physician Orders Details Patient Name: Kristin Mora Date of Service: 08/12/2016 3:00 PM Medical Record Number: [id number] Patient Account Number: [id number] Date of Birth/Sex: 03-24-1956 (62 y.o. Female) Treating RN: Cornell Barman Primary Care Provider: Carmon Ginsberg Other Clinician: Referring Provider: Carmon Ginsberg Treating  Provider/Extender: Frann Rider in Treatment: 3 Verbal / Phone Orders: No Diagnosis Coding Wound Cleansing Wound #1 Left Axilla o Clean wound with Normal Saline. o Cleanse wound with mild soap and water Primary Wound Dressing Wound #1 Left Axilla o Prisma Ag Secondary Dressing Wound #1 Left Axilla o Other - coverlet Dressing Change Frequency Wound #1 Left Axilla o Change dressing every other day. Follow-up Appointments Wound #1 Left Axilla o Return Appointment in 1 week. Electronic Signature(s) Signed: 08/12/2016 3:53:40 PM By: Christin Fudge MD, FACS Signed: 08/12/2016 4:57:49 PM By: Gretta Cool RN, BSN, Kim RN, BSN Entered By: Gretta Cool, RN, BSN, Kim on 08/12/2016 15:28:05 Kristin Mora ([id number]) -------------------------------------------------------------------------------- Problem List Details Patient Name: Kristin Mora. Date of Service: 08/12/2016 3:00 PM Medical Record Number: [id number] Patient Account Number: [id number] Date of Birth/Sex: 05/25/1956 (61 y.o. Female) Treating RN: Cornell Barman Primary Care Provider: Carmon Ginsberg Other Clinician: Referring Provider: Carmon Ginsberg Treating Provider/Extender: Frann Rider in Treatment: 3 Active  Problems ICD-10 Encounter Code Description Active Date Diagnosis E11.622 Type 2 diabetes mellitus with other skin ulcer 07/21/2016 Yes L73.2 Hidradenitis suppurativa 07/21/2016 Yes S21.90XA Unspecified open wound of unspecified part of thorax, 07/21/2016 Yes initial encounter Inactive Problems Resolved Problems Electronic Signature(s) Signed: 08/12/2016 3:51:19 PM By: Christin Fudge MD, FACS Entered By: Christin Fudge on 08/12/2016 15:51:18 Kristin Mora ([id number]) -------------------------------------------------------------------------------- Progress Note Details Patient Name: Kristin Mora Date of Service: 08/12/2016 3:00 PM Medical Record Number: [id number] Patient Account Number: [id number] Date of Birth/Sex: Nov 20, 1955 (61 y.o. Female) Treating RN: Cornell Barman Primary Care Provider: Carmon Ginsberg Other Clinician: Referring Provider: Carmon Ginsberg Treating Provider/Extender: Frann Rider in Treatment: 3 Subjective Chief Complaint Information obtained from Patient Patient seen for complaints of Non-Healing Wound the left axillary area which she's had for at least 2 months History of Present Illness (HPI) The following HPI elements were documented for the patient's wound: Location: after axillary area Quality: Patient reports No Pain. Severity: Patient states wound (s) are getting better. Duration: Patient has had the wound for > 2 months prior to seeking treatment at the wound center Context: The wound appeared gradually over time Modifying Factors: Other treatment(s) tried include:doxycycline and clindamycin gel Associated Signs and Symptoms: Patient reports having foul odor. 61 year old patient who has a past medical history significant for diabetes mellitus, essential hypertension, COPD, CVA, axillary hidradenitis repetitive, vertigo, recently presented to the emergency department with hydradenitis suppurtiva, which is normally managed by her PCP and  currently takes doxycycline. she is a former smoker and quit in 2011. ER advised her clindamycin gel locally and to follow-up with the wound center. asked hemoglobin A1c done 3 weeks ago was 6.5%. 07/28/2016 -- her dermatology appointment is not until the middle of March. 08/05/2016 -- the patient has been doing the dressing as planned and has no fresh issue. Objective Constitutional Pulse regular. Respirations normal and unlabored. Afebrile. Vitals Time Taken: 3:14 PM, Height: 62 in, Weight: 296 lbs, BMI: 54.1, Temperature: 98.1 F, Respiratory Rate: 16 breaths/min, Blood Pressure: 158/88 mmHg. ODALIS, PRUESS ([id number]) Eyes Nonicteric. Reactive to light. Ears, Nose, Mouth, and Throat Lips, teeth, and gums WNL.Marland Kitchen Moist mucosa without lesions. Neck supple and nontender. No palpable supraclavicular or cervical adenopathy. Normal sized without goiter. Respiratory WNL. No retractions.. Cardiovascular Pedal Pulses WNL. No clubbing, cyanosis or edema. Lymphatic No adneopathy. No adenopathy. No adenopathy. Musculoskeletal Adexa without tenderness or enlargement.. Digits and nails w/o clubbing, cyanosis, infection, petechiae, ischemia, or inflammatory conditions.Marland Kitchen Psychiatric Judgement and insight Intact.Marland Kitchen No  evidence of depression, anxiety, or agitation.. General Notes: the wound looks excellent and has come down significantly in size and minimal open areas will be treated appropriately Integumentary (Hair, Skin) No suspicious lesions. No crepitus or fluctuance. No peri-wound warmth or erythema. No masses.. Wound #1 status is Open. Original cause of wound was Gradually Appeared. The wound is located on the Left Axilla. The wound measures 0.4cm length x 0.8cm width x 0.1cm depth; 0.251cm^2 area and 0.025cm^3 volume. The wound is limited to skin breakdown. There is no tunneling or undermining noted. There is a medium amount of serous drainage noted. The wound margin is distinct with  the outline attached to the wound base. There is large (67-100%) red, friable granulation within the wound bed. There is no necrotic tissue within the wound bed. The periwound skin appearance exhibited: Excoriation, Scarring. The periwound skin appearance did not exhibit: Callus, Crepitus, Induration, Rash, Dry/Scaly, Maceration, Atrophie Blanche, Cyanosis, Ecchymosis, Hemosiderin Staining, Mottled, Pallor, Rubor, Erythema. Periwound temperature was noted as No Abnormality. The periwound has tenderness on palpation. Assessment Active Problems ICD-10 SHAHNAZ, KLASS ([id number]) E11.622 - Type 2 diabetes mellitus with other skin ulcer L73.2 - Hidradenitis suppurativa S21.90XA - Unspecified open wound of unspecified part of thorax, initial encounter Plan Wound Cleansing: Wound #1 Left Axilla: Clean wound with Normal Saline. Cleanse wound with mild soap and water Primary Wound Dressing: Wound #1 Left Axilla: Prisma Ag Secondary Dressing: Wound #1 Left Axilla: Other - coverlet Dressing Change Frequency: Wound #1 Left Axilla: Change dressing every other day. Follow-up Appointments: Wound #1 Left Axilla: Return Appointment in 1 week. I have recommended; 1. Silver collagen with a bordered foam to be applied every other day. 2. good control of her diabetes mellitus 3. Follow-up with her dermatology appointment -- coming up next week 4. Review by me next week Electronic Signature(s) Signed: 08/12/2016 3:52:45 PM By: Christin Fudge MD, FACS Entered By: Christin Fudge on 08/12/2016 15:52:44 Kristin Mora ([id number]) -------------------------------------------------------------------------------- SuperBill Details Patient Name: Kristin Mora Date of Service: 08/12/2016 Medical Record Number: [id number] Patient Account Number: [id number] Date of Birth/Sex: 28-Jul-1955 (61 y.o. Female) Treating RN: Cornell Barman Primary Care Provider: Carmon Ginsberg Other Clinician: Referring  Provider: Carmon Ginsberg Treating Provider/Extender: Christin Fudge Service Line: Outpatient Weeks in Treatment: 3 Diagnosis Coding ICD-10 Codes Code Description E11.622 Type 2 diabetes mellitus with other skin ulcer L73.2 Hidradenitis suppurativa S21.90XA Unspecified open wound of unspecified part of thorax, initial encounter Facility Procedures CPT4 Code: [id number] Description: [phone] - WOUND CARE VISIT-LEV 2 EST PT Modifier: Quantity: 1 Physician Procedures CPT4 Code Description: [id number] - WC PHYS LEVEL 3 - EST PT ICD-10 Description Diagnosis E11.622 Type 2 diabetes mellitus with other skin ulcer L73.2 Hidradenitis suppurativa S21.90XA Unspecified open wound of unspecified part of thor Modifier: ax, initial en Quantity: 1 counter Electronic Signature(s) Signed: 08/12/2016 3:52:59 PM By: Christin Fudge MD, FACS Entered By: Christin Fudge on 08/12/2016 15:52:58

## 2016-08-19 ENCOUNTER — Encounter: Payer: Self-pay | Admitting: Surgery

## 2016-08-20 NOTE — Progress Notes (Signed)
Kristin Mora, Kristin Mora ([id number]) Visit Report for 08/19/2016 Arrival Information Details Patient Name: Kristin Mora, Kristin Mora. Date of Service: 08/19/2016 1:30 PM Medical Record Number: [id number] Patient Account Number: [id number] Date of Birth/Sex: 1955/07/26 (61 y.o. Female) Treating RN: Afful, RN, BSN, Velva Harman Primary Care Rica Heather: Carmon Ginsberg Other Clinician: Referring Alim Cattell: Carmon Ginsberg Treating Meka Lewan/Extender: Frann Rider in Treatment: 4 Visit Information History Since Last Visit All ordered tests and consults were completed: No Patient Arrived: Ambulatory Added or deleted any medications: No Arrival Time: 13:30 Any new allergies or adverse reactions: No Accompanied By: self Had a fall or experienced change in No Transfer Assistance: None activities of daily living that may affect Patient Identification Verified: Yes risk of falls: Secondary Verification Process Yes Signs or symptoms of abuse/neglect since last No Completed: visito Patient Requires Transmission- No Hospitalized since last visit: No Based Precautions: Has Dressing in Place as Prescribed: Yes Patient Has Alerts: Yes Pain Present Now: No Patient Alerts: Type II Diabetic Electronic Signature(s) Signed: 08/19/2016 2:23:26 PM By: Regan Lemming BSN, RN Entered By: Regan Lemming on 08/19/2016 13:31:15 Kristin Mora ([id number]) -------------------------------------------------------------------------------- Clinic Level of Care Assessment Details Patient Name: Kristin Mora Date of Service: 08/19/2016 1:30 PM Medical Record Number: [id number] Patient Account Number: [id number] Date of Birth/Sex: 08/22/1955 (61 y.o. Female) Treating RN: Afful, RN, BSN, [place] Primary Care Kelijah Towry: Carmon Ginsberg Other Clinician: Referring Brylon Brenning: Carmon Ginsberg Treating Kadan Millstein/Extender: Frann Rider in Treatment: 4 Clinic Level of Care Assessment Items TOOL 4 Quantity Score []  - Use when only an  EandM is performed on FOLLOW-UP visit 0 ASSESSMENTS - Nursing Assessment / Reassessment X - Reassessment of Co-morbidities (includes updates in patient status) 1 10 X - Reassessment of Adherence to Treatment Plan 1 5 ASSESSMENTS - Wound and Skin Assessment / Reassessment X - Simple Wound Assessment / Reassessment - one wound 1 5 []  - Complex Wound Assessment / Reassessment - multiple wounds 0 []  - Dermatologic / Skin Assessment (not related to wound area) 0 ASSESSMENTS - Focused Assessment []  - Circumferential Edema Measurements - multi extremities 0 []  - Nutritional Assessment / Counseling / Intervention 0 []  - Lower Extremity Assessment (monofilament, tuning fork, pulses) 0 []  - Peripheral Arterial Disease Assessment (using hand held doppler) 0 ASSESSMENTS - Ostomy and/or Continence Assessment and Care []  - Incontinence Assessment and Management 0 []  - Ostomy Care Assessment and Management (repouching, etc.) 0 PROCESS - Coordination of Care X - Simple Patient / Family Education for ongoing care 1 15 []  - Complex (extensive) Patient / Family Education for ongoing care 0 []  - Staff obtains Programmer, systems, Records, Test Results / Process Orders 0 []  - Staff telephones HHA, Nursing Homes / Clarify orders / etc 0 []  - Routine Transfer to another Facility (non-emergent condition) 0 Kristin Mora, Kristin Mora ([id number]) []  - Routine Hospital Admission (non-emergent condition) 0 []  - New Admissions / Biomedical engineer / Ordering NPWT, Apligraf, etc. 0 []  - Emergency Hospital Admission (emergent condition) 0 []  - Simple Discharge Coordination 0 []  - Complex (extensive) Discharge Coordination 0 PROCESS - Special Needs []  - Pediatric / Minor Patient Management 0 []  - Isolation Patient Management 0 []  - Hearing / Language / Visual special needs 0 []  - Assessment of Community assistance (transportation, D/C planning, etc.) 0 []  - Additional assistance / Altered mentation 0 []  - Support Surface(s)  Assessment (bed, cushion, seat, etc.) 0 INTERVENTIONS - Wound Cleansing / Measurement X - Simple Wound Cleansing - one wound 1 5 []  - Complex Wound Cleansing -  multiple wounds 0 X - Wound Imaging (photographs - any number of wounds) 1 5 []  - Wound Tracing (instead of photographs) 0 X - Simple Wound Measurement - one wound 1 5 []  - Complex Wound Measurement - multiple wounds 0 INTERVENTIONS - Wound Dressings X - Small Wound Dressing one or multiple wounds 1 10 []  - Medium Wound Dressing one or multiple wounds 0 []  - Large Wound Dressing one or multiple wounds 0 []  - Application of Medications - topical 0 []  - Application of Medications - injection 0 INTERVENTIONS - Miscellaneous []  - External ear exam 0 Kristin Mora, Kristin Mora ([id number]) []  - Specimen Collection (cultures, biopsies, blood, body fluids, etc.) 0 []  - Specimen(s) / Culture(s) sent or taken to Lab for analysis 0 []  - Patient Transfer (multiple staff / Harrel Lemon Lift / Similar devices) 0 []  - Simple Staple / Suture removal (25 or less) 0 []  - Complex Staple / Suture removal (26 or more) 0 []  - Hypo / Hyperglycemic Management (close monitor of Blood Glucose) 0 []  - Ankle / Brachial Index (ABI) - do not check if billed separately 0 X - Vital Signs 1 5 Has the patient been seen at the hospital within the last three years: Yes Total Score: 65 Level Of Care: New/Established - Level 2 Electronic Signature(s) Signed: 08/19/2016 2:23:26 PM By: Regan Lemming BSN, RN Entered By: Regan Lemming on 08/19/2016 13:45:33 Kristin Mora ([id number]) -------------------------------------------------------------------------------- Encounter Discharge Information Details Patient Name: Kristin Mora Date of Service: 08/19/2016 1:30 PM Medical Record Number: [id number] Patient Account Number: [id number] Date of Birth/Sex: [birth year] (61 y.o. Female) Treating RN: Baruch Gouty, RN, BSN, Velva Harman Primary Care Markee Remlinger: Carmon Ginsberg Other Clinician: Referring  Danyella Mcginty: Carmon Ginsberg Treating Waniya Hoglund/Extender: Frann Rider in Treatment: 4 Encounter Discharge Information Items Discharge Pain Level: 0 Discharge Condition: Stable Ambulatory Status: Ambulatory Discharge Destination: Home Transportation: Private Auto Accompanied By: self Schedule Follow-up Appointment: No Medication Reconciliation completed and provided to Patient/Care No Levy Cedano: Provided on Clinical Summary of Care: 08/19/2016 Form Type Recipient Paper Patient JP Electronic Signature(s) Signed: 08/19/2016 2:23:26 PM By: Regan Lemming BSN, RN Previous Signature: 08/19/2016 1:45:35 PM Version By: Ruthine Dose Entered By: Regan Lemming on 08/19/2016 13:46:19 Kristin Mora ([id number]) -------------------------------------------------------------------------------- Lower Extremity Assessment Details Patient Name: Kristin Mora Date of Service: 08/19/2016 1:30 PM Medical Record Number: [id number] Patient Account Number: [id number] Date of Birth/Sex: 01-11-1956 (61 y.o. Female) Treating RN: Afful, RN, BSN, [employer] Primary Care Kandi Brusseau: Carmon Ginsberg Other Clinician: Referring Jaegar Croft: Carmon Ginsberg Treating Kendon Sedeno/Extender: Frann Rider in Treatment: 4 Electronic Signature(s) Signed: 08/19/2016 2:23:26 PM By: Regan Lemming BSN, RN Entered By: Regan Lemming on 08/19/2016 13:31:32 Kristin Mora ([id number]) -------------------------------------------------------------------------------- Multi Wound Chart Details Patient Name: Kristin Mora Date of Service: 08/19/2016 1:30 PM Medical Record Number: [id number] Patient Account Number: [id number] Date of Birth/Sex: 05-04-1956 (61 y.o. Female) Treating RN: Baruch Gouty, RN, BSN, Velva Harman Primary Care Alante Weimann: Carmon Ginsberg Other Clinician: Referring Masa Lubin: Carmon Ginsberg Treating Miller Limehouse/Extender: Frann Rider in Treatment: 4 Vital Signs Height(in): 62 Pulse(bpm): 73 Weight(lbs): 296 Blood  Pressure 169/91 (mmHg): Body Mass Index(BMI): 54 Temperature(F): 98.1 Respiratory Rate 17 (breaths/min): Photos: [1:No Photos] [N/A:N/A] Wound Location: [1:Left Axilla] [N/A:N/A] Wounding Event: [1:Gradually Appeared] [N/A:N/A] Primary Etiology: [1:Abscess] [N/A:N/A] Comorbid History: [1:Hypertension, Type II Diabetes] [N/A:N/A] Date Acquired: [1:05/23/2016] [N/A:N/A] Weeks of Treatment: [1:4] [N/A:N/A] Wound Status: [1:Open] [N/A:N/A] Measurements L x W x D 0.2x0.2x0.1 [N/A:N/A] (cm) Area (cm) : [1:0.031] [N/A:N/A] Volume (cm) : [1:0.003] [N/A:N/A] % Reduction in Area: [1:98.00%] [  N/A:N/A] % Reduction in Volume: 98.10% [N/A:N/A] Classification: [1:Partial Thickness] [N/A:N/A] Exudate Amount: [1:Small] [N/A:N/A] Exudate Type: [1:Serous] [N/A:N/A] Exudate Color: [1:amber] [N/A:N/A] Wound Margin: [1:Distinct, outline attached] [N/A:N/A] Granulation Amount: [1:Large (67-100%)] [N/A:N/A] Granulation Quality: [1:Red, Friable] [N/A:N/A] Necrotic Amount: [1:None Present (0%)] [N/A:N/A] Exposed Structures: [1:Fascia: No Fat Layer (Subcutaneous Tissue) Exposed: No Tendon: No Muscle: No Joint: No Bone: No] [N/A:N/A] Limited to Skin Breakdown Epithelialization: Large (67-100%) N/A N/A Periwound Skin Texture: Excoriation: Yes N/A N/A Scarring: Yes Induration: No Callus: No Crepitus: No Rash: No Periwound Skin Maceration: No N/A N/A Moisture: Dry/Scaly: No Periwound Skin Color: Atrophie Blanche: No N/A N/A Cyanosis: No Ecchymosis: No Erythema: No Hemosiderin Staining: No Mottled: No Pallor: No Rubor: No Temperature: No Abnormality N/A N/A Tenderness on No N/A N/A Palpation: Wound Preparation: Ulcer Cleansing: N/A N/A Rinsed/Irrigated with Saline Topical Anesthetic Applied: None Treatment Notes Wound #1 (Left Axilla) 1. Cleansed with: Clean wound with Normal Saline 4. Dressing Applied: Prisma Ag Notes coverlette Electronic Signature(s) Signed: 08/19/2016  1:57:06 PM By: Christin Fudge MD, FACS Entered By: Christin Fudge on 08/19/2016 13:57:05 Kristin Mora ([id number]) -------------------------------------------------------------------------------- [place] Details Patient Name: Kristin Mora Date of Service: 08/19/2016 1:30 PM Medical Record Number: [id number] Patient Account Number: [id number] Date of Birth/Sex: 02-04-1956 (61 y.o. Female) Treating RN: Afful, RN, BSN, Velva Harman Primary Care Kyi Romanello: Carmon Ginsberg Other Clinician: Referring Demonica Farrey: Carmon Ginsberg Treating Quinnton Bury/Extender: Frann Rider in Treatment: 4 Active Inactive ` Orientation to the Wound Care Program Nursing Diagnoses: Knowledge deficit related to the wound healing center program Goals: Patient/caregiver will verbalize understanding of the [facility] Program Date Initiated: 07/21/2016 Target Resolution Date: 08/01/2016 Goal Status: Active Interventions: Provide education on orientation to the wound center Notes: ` Soft Tissue Infection Nursing Diagnoses: Impaired tissue integrity Knowledge deficit related to home infection control: handwashing, handling of soiled dressings, supply storage Potential for infection: soft tissue Goals: Patient will remain free of wound infection Date Initiated: 07/21/2016 Target Resolution Date: 08/01/2016 Goal Status: Active Patient/caregiver will verbalize understanding of or measures to prevent infection and contamination in the home setting Date Initiated: 07/21/2016 Target Resolution Date: 08/01/2016 Goal Status: Active Signs and symptoms of infection will be recognized early to allow for prompt treatment Date Initiated: 07/21/2016 Target Resolution Date: 08/01/2016 Goal Status: Active Kristin Mora, Kristin Mora ([id number]) Interventions: Assess signs and symptoms of infection every visit Treatment Activities: Systemic antibiotics : 07/21/2016 Notes: ` Wound/Skin Impairment Nursing  Diagnoses: Impaired tissue integrity Goals: Ulcer/skin breakdown will have a volume reduction of 30% by week 4 Date Initiated: 07/21/2016 Target Resolution Date: 08/18/2016 Goal Status: Active Interventions: Assess patient/caregiver ability to obtain necessary supplies Assess ulceration(s) every visit Notes: Electronic Signature(s) Signed: 08/19/2016 2:23:26 PM By: Regan Lemming BSN, RN Entered By: Regan Lemming on 08/19/2016 13:36:00 Kristin Mora ([id number]) -------------------------------------------------------------------------------- Pain Assessment Details Patient Name: Kristin Mora Date of Service: 08/19/2016 1:30 PM Medical Record Number: [id number] Patient Account Number: [id number] Date of Birth/Sex: [birth year] (61 y.o. Female) Treating RN: Baruch Gouty, RN, BSN, Velva Harman Primary Care Sabena Winner: Carmon Ginsberg Other Clinician: Referring Alize Acy: Carmon Ginsberg Treating Gaelyn Tukes/Extender: Frann Rider in Treatment: 4 Active Problems Location of Pain Severity and Description of Pain Patient Has Paino No Site Locations With Dressing Change: No Pain Management and Medication Current Pain Management: Electronic Signature(s) Signed: 08/19/2016 2:23:26 PM By: Regan Lemming BSN, RN Entered By: Regan Lemming on 08/19/2016 13:31:21 Kristin Mora ([id number]) -------------------------------------------------------------------------------- Patient/Caregiver Education Details Patient Name: Kristin Mora Date of Service: 08/19/2016 1:30 PM Medical  Record Number: [id number] Patient Account Number: [id number] Date of Birth/Gender: 1956/03/05 (61 y.o. Female) Treating RN: Baruch Gouty, RN, BSN, Velva Harman Primary Care Physician: Carmon Ginsberg Other Clinician: Referring Physician: Carmon Ginsberg Treating Physician/Extender: Frann Rider in Treatment: 4 Education Assessment Education Provided To: Patient Education Topics Provided Welcome To [facility]: Methods:  Explain/Verbal Responses: State content correctly Wound/Skin Impairment: Methods: Explain/Verbal Responses: State content correctly Electronic Signature(s) Signed: 08/19/2016 2:23:26 PM By: Regan Lemming BSN, RN Entered By: Regan Lemming on 08/19/2016 13:46:33 Kristin Mora ([id number]) -------------------------------------------------------------------------------- Wound Assessment Details Patient Name: Kristin Mora Date of Service: 08/19/2016 1:30 PM Medical Record Number: [id number] Patient Account Number: [id number] Date of Birth/Sex: 04-Aug-1955 (61 y.o. Female) Treating RN: Afful, RN, BSN, [place] Primary Care Everlie Eble: Carmon Ginsberg Other Clinician: Referring Tania Steinhauser: Carmon Ginsberg Treating Geniva Lohnes/Extender: Frann Rider in Treatment: 4 Wound Status Wound Number: 1 Primary Etiology: Abscess Wound Location: Left Axilla Wound Status: Open Wounding Event: Gradually Appeared Comorbid History: Hypertension, Type II Diabetes Date Acquired: 05/23/2016 Weeks Of Treatment: 4 Clustered Wound: No Photos Photo Uploaded By: Regan Lemming on 08/19/2016 14:18:56 Wound Measurements Length: (cm) 0.2 Width: (cm) 0.2 Depth: (cm) 0.1 Area: (cm) 0.031 Volume: (cm) 0.003 % Reduction in Area: 98% % Reduction in Volume: 98.1% Epithelialization: Large (67-100%) Tunneling: No Undermining: No Wound Description Classification: Partial Thickness Foul Odor Aft Wound Margin: Distinct, outline attached Slough/Fibrin Exudate Amount: Small Exudate Type: Serous Exudate Color: amber Kristin Mora, Kristin Mora ([id number]) er Cleansing: No o No Wound Bed Granulation Amount: Large (67-100%) Exposed Structure Granulation Quality: Red, Friable Fascia Exposed: No Necrotic Amount: None Present (0%) Fat Layer (Subcutaneous Tissue) Exposed: No Tendon Exposed: No Muscle Exposed: No Joint Exposed: No Bone Exposed: No Limited to Skin Breakdown Periwound Skin Texture Texture Color No  Abnormalities Noted: No No Abnormalities Noted: No Callus: No Atrophie Blanche: No Crepitus: No Cyanosis: No Excoriation: Yes Ecchymosis: No Induration: No Erythema: No Rash: No Hemosiderin Staining: No Scarring: Yes Mottled: No Pallor: No Moisture Rubor: No No Abnormalities Noted: No Dry / Scaly: No Temperature / Pain Maceration: No Temperature: No Abnormality Wound Preparation Ulcer Cleansing: Rinsed/Irrigated with Saline Topical Anesthetic Applied: None Treatment Notes Wound #1 (Left Axilla) 1. Cleansed with: Clean wound with Normal Saline 4. Dressing Applied: Prisma Ag Notes coverlette Electronic Signature(s) Signed: 08/19/2016 2:23:26 PM By: Regan Lemming BSN, RN Entered By: Regan Lemming on 08/19/2016 13:35:49 Kristin Mora ([id number]) -------------------------------------------------------------------------------- Vitals Details Patient Name: Kristin Mora Date of Service: 08/19/2016 1:30 PM Medical Record Number: [id number] Patient Account Number: [id number] Date of Birth/Sex: [birth year] (61 y.o. Female) Treating RN: Afful, RN, BSN, [place] Primary Care Dmitri Pettigrew: Carmon Ginsberg Other Clinician: Referring Jamail Cullers: Carmon Ginsberg Treating Caera Enwright/Extender: Frann Rider in Treatment: 4 Vital Signs Time Taken: 13:31 Temperature (F): 98.1 Height (in): 62 Pulse (bpm): 73 Weight (lbs): 296 Respiratory Rate (breaths/min): 17 Body Mass Index (BMI): 54.1 Blood Pressure (mmHg): 169/91 Reference Range: 80 - 120 mg / dl Electronic Signature(s) Signed: 08/19/2016 2:23:26 PM By: Regan Lemming BSN, RN Entered By: Regan Lemming on 08/19/2016 13:33:45

## 2016-08-20 NOTE — Progress Notes (Signed)
JATANA, SHUNK ([id number]) Visit Report for 08/19/2016 Chief Complaint Document Details Patient Name: Kristin Mora, Kristin Mora. Date of Service: 08/19/2016 1:30 PM Medical Record Number: [id number] Patient Account Number: [id number] Date of Birth/Sex: 11-Jul-1955 (61 y.o. Female) Treating RN: Baruch Gouty, RN, BSN, Velva Harman Primary Care Provider: Carmon Ginsberg Other Clinician: Referring Provider: Carmon Ginsberg Treating Provider/Extender: Frann Rider in Treatment: 4 Information Obtained from: Patient Chief Complaint Patient seen for complaints of Non-Healing Wound the left axillary area which she's had for at least 2 months Electronic Signature(s) Signed: 08/19/2016 1:57:12 PM By: Christin Fudge MD, FACS Entered By: Christin Fudge on 08/19/2016 13:57:12 Kristin Mora ([id number]) -------------------------------------------------------------------------------- HPI Details Patient Name: Kristin Mora Date of Service: 08/19/2016 1:30 PM Medical Record Number: [id number] Patient Account Number: [id number] Date of Birth/Sex: 05/21/1956 (61 y.o. Female) Treating RN: Baruch Gouty, RN, BSN, Velva Harman Primary Care Provider: Carmon Ginsberg Other Clinician: Referring Provider: Carmon Ginsberg Treating Provider/Extender: Frann Rider in Treatment: 4 History of Present Illness Location: after axillary area Quality: Patient reports No Pain. Severity: Patient states wound (s) are getting better. Duration: Patient has had the wound for > 2 months prior to seeking treatment at the wound center Context: The wound appeared gradually over time Modifying Factors: Other treatment(s) tried include:doxycycline and clindamycin gel Associated Signs and Symptoms: Patient reports having foul odor. HPI Description: 61 year old patient who has a past medical history significant for diabetes mellitus, essential hypertension, COPD, CVA, axillary hidradenitis repetitive, vertigo, recently presented to the emergency  department with hydradenitis suppurtiva, which is normally managed by her PCP and currently takes doxycycline. she is a former smoker and quit in 2011. ER advised her clindamycin gel locally and to follow-up with the wound center. asked hemoglobin A1c done 3 weeks ago was 6.5%. 07/28/2016 -- her dermatology appointment is not until the middle of March. 08/05/2016 -- the patient has been doing the dressing as planned and has no fresh issue. Electronic Signature(s) Signed: 08/19/2016 1:57:26 PM By: Christin Fudge MD, FACS Entered By: Christin Fudge on 08/19/2016 13:57:26 Kristin Mora ([id number]) -------------------------------------------------------------------------------- Physical Exam Details Patient Name: Kristin Mora Date of Service: 08/19/2016 1:30 PM Medical Record Number: [id number] Patient Account Number: [id number] Date of Birth/Sex: 1955/08/23 (61 y.o. Female) Treating RN: Baruch Gouty, RN, BSN, Velva Harman Primary Care Provider: Carmon Ginsberg Other Clinician: Referring Provider: Carmon Ginsberg Treating Provider/Extender: Frann Rider in Treatment: 4 Constitutional . Pulse regular. Respirations normal and unlabored. Afebrile. . Eyes Nonicteric. Reactive to light. Ears, Nose, Mouth, and Throat Lips, teeth, and gums WNL.Marland Kitchen Moist mucosa without lesions. Neck supple and nontender. No palpable supraclavicular or cervical adenopathy. Normal sized without goiter. Respiratory WNL. No retractions.. Breath sounds WNL, No rubs, rales, rhonchi, or wheeze.. Cardiovascular Heart rhythm and rate regular, no murmur or gallop.. Pedal Pulses WNL. No clubbing, cyanosis or edema. Lymphatic No adneopathy. No adenopathy. No adenopathy. Musculoskeletal Adexa without tenderness or enlargement.. Digits and nails w/o clubbing, cyanosis, infection, petechiae, ischemia, or inflammatory conditions.. Integumentary (Hair, Skin) No suspicious lesions. No crepitus or fluctuance. No peri-wound warmth  or erythema. No masses.Marland Kitchen Psychiatric Judgement and insight Intact.. No evidence of depression, anxiety, or agitation.. Notes except for a small area of about 3 mm everything else is epithelialized and the wound is looking good Electronic Signature(s) Signed: 08/19/2016 1:58:10 PM By: Christin Fudge MD, FACS Entered By: Christin Fudge on 08/19/2016 13:58:09 Kristin Mora ([id number]) -------------------------------------------------------------------------------- Physician Orders Details Patient Name: Kristin Mora Date of Service: 08/19/2016 1:30 PM Medical Record Number: [id number] Patient Account Number:  [id number] Date of Birth/Sex: 09-21-1955 (61 y.o. Female) Treating RN: Baruch Gouty, RN, BSN, Velva Harman Primary Care Provider: Carmon Ginsberg Other Clinician: Referring Provider: Carmon Ginsberg Treating Provider/Extender: Frann Rider in Treatment: 4 Verbal / Phone Orders: No Diagnosis Coding Wound Cleansing Wound #1 Left Axilla o Clean wound with Normal Saline. o Cleanse wound with mild soap and water Primary Wound Dressing Wound #1 Left Axilla o Prisma Ag Secondary Dressing Wound #1 Left Axilla o Other - coverlet Dressing Change Frequency Wound #1 Left Axilla o Change dressing every other day. Follow-up Appointments Wound #1 Left Axilla o Return Appointment in 1 week. Electronic Signature(s) Signed: 08/19/2016 2:23:26 PM By: Regan Lemming BSN, RN Signed: 08/19/2016 4:26:35 PM By: Christin Fudge MD, FACS Entered By: Regan Lemming on 08/19/2016 13:43:09 Kristin Mora ([id number]) -------------------------------------------------------------------------------- Problem List Details Patient Name: Kristin Mora, Kristin Mora. Date of Service: 08/19/2016 1:30 PM Medical Record Number: [id number] Patient Account Number: [id number] Date of Birth/Sex: 1955-11-10 (61 y.o. Female) Treating RN: Baruch Gouty, RN, BSN, Velva Harman Primary Care Provider: Carmon Ginsberg Other Clinician: Referring  Provider: Carmon Ginsberg Treating Provider/Extender: Frann Rider in Treatment: 4 Active Problems ICD-10 Encounter Code Description Active Date Diagnosis E11.622 Type 2 diabetes mellitus with other skin ulcer 07/21/2016 Yes L73.2 Hidradenitis suppurativa 07/21/2016 Yes S21.90XA Unspecified open wound of unspecified part of thorax, 07/21/2016 Yes initial encounter Inactive Problems Resolved Problems Electronic Signature(s) Signed: 08/19/2016 1:57:02 PM By: Christin Fudge MD, FACS Entered By: Christin Fudge on 08/19/2016 13:57:02 Kristin Mora ([id number]) -------------------------------------------------------------------------------- Progress Note Details Patient Name: Kristin Mora Date of Service: 08/19/2016 1:30 PM Medical Record Number: [id number] Patient Account Number: [id number] Date of Birth/Sex: Nov 22, 1955 (61 y.o. Female) Treating RN: Afful, RN, BSN, Velva Harman Primary Care Provider: Carmon Ginsberg Other Clinician: Referring Provider: Carmon Ginsberg Treating Provider/Extender: Frann Rider in Treatment: 4 Subjective Chief Complaint Information obtained from Patient Patient seen for complaints of Non-Healing Wound the left axillary area which she's had for at least 2 months History of Present Illness (HPI) The following HPI elements were documented for the patient's wound: Location: after axillary area Quality: Patient reports No Pain. Severity: Patient states wound (s) are getting better. Duration: Patient has had the wound for > 2 months prior to seeking treatment at the wound center Context: The wound appeared gradually over time Modifying Factors: Other treatment(s) tried include:doxycycline and clindamycin gel Associated Signs and Symptoms: Patient reports having foul odor. 61 year old patient who has a past medical history significant for diabetes mellitus, essential hypertension, COPD, CVA, axillary hidradenitis repetitive, vertigo, recently  presented to the emergency department with hydradenitis suppurtiva, which is normally managed by her PCP and currently takes doxycycline. she is a former smoker and quit in 2011. ER advised her clindamycin gel locally and to follow-up with the wound center. asked hemoglobin A1c done 3 weeks ago was 6.5%. 07/28/2016 -- her dermatology appointment is not until the middle of March. 08/05/2016 -- the patient has been doing the dressing as planned and has no fresh issue. Objective Constitutional Pulse regular. Respirations normal and unlabored. Afebrile. Vitals Time Taken: 1:31 PM, Height: 62 in, Weight: 296 lbs, BMI: 54.1, Temperature: 98.1 F, Pulse: 73 bpm, Respiratory Rate: 17 breaths/min, Blood Pressure: 169/91 mmHg. Kristin Mora, Kristin Mora ([id number]) Eyes Nonicteric. Reactive to light. Ears, Nose, Mouth, and Throat Lips, teeth, and gums WNL.Marland Kitchen Moist mucosa without lesions. Neck supple and nontender. No palpable supraclavicular or cervical adenopathy. Normal sized without goiter. Respiratory WNL. No retractions.. Breath sounds WNL, No rubs, rales, rhonchi, or wheeze.. Cardiovascular Heart  rhythm and rate regular, no murmur or gallop.. Pedal Pulses WNL. No clubbing, cyanosis or edema. Lymphatic No adneopathy. No adenopathy. No adenopathy. Musculoskeletal Adexa without tenderness or enlargement.. Digits and nails w/o clubbing, cyanosis, infection, petechiae, ischemia, or inflammatory conditions.Marland Kitchen Psychiatric Judgement and insight Intact.. No evidence of depression, anxiety, or agitation.. General Notes: except for a small area of about 3 mm everything else is epithelialized and the wound is looking good Integumentary (Hair, Skin) No suspicious lesions. No crepitus or fluctuance. No peri-wound warmth or erythema. No masses.. Wound #1 status is Open. Original cause of wound was Gradually Appeared. The wound is located on the Left Axilla. The wound measures 0.2cm length x 0.2cm width x 0.1cm  depth; 0.031cm^2 area and 0.003cm^3 volume. The wound is limited to skin breakdown. There is no tunneling or undermining noted. There is a small amount of serous drainage noted. The wound margin is distinct with the outline attached to the wound base. There is large (67-100%) red, friable granulation within the wound bed. There is no necrotic tissue within the wound bed. The periwound skin appearance exhibited: Excoriation, Scarring. The periwound skin appearance did not exhibit: Callus, Crepitus, Induration, Rash, Dry/Scaly, Maceration, Atrophie Blanche, Cyanosis, Ecchymosis, Hemosiderin Staining, Mottled, Pallor, Rubor, Erythema. Periwound temperature was noted as No Abnormality. Assessment Active Problems ICD-10 Kristin Mora, Kristin Mora ([id number]) E11.622 - Type 2 diabetes mellitus with other skin ulcer L73.2 - Hidradenitis suppurativa S21.90XA - Unspecified open wound of unspecified part of thorax, initial encounter Plan Wound Cleansing: Wound #1 Left Axilla: Clean wound with Normal Saline. Cleanse wound with mild soap and water Primary Wound Dressing: Wound #1 Left Axilla: Prisma Ag Secondary Dressing: Wound #1 Left Axilla: Other - coverlet Dressing Change Frequency: Wound #1 Left Axilla: Change dressing every other day. Follow-up Appointments: Wound #1 Left Axilla: Return Appointment in 1 week. I have recommended; 1. Silver collagen with a bordered foam to be applied every other day. 2. good control of her diabetes mellitus 3. Follow-up with her dermatology appointment -- coming up next week 4. Review by me next week -- I anticipate discharge soon Electronic Signature(s) Signed: 08/19/2016 1:58:43 PM By: Christin Fudge MD, FACS Entered By: Christin Fudge on 08/19/2016 13:58:43 Kristin Mora ([id number]) -------------------------------------------------------------------------------- SuperBill Details Patient Name: Kristin Mora Date of Service: 08/19/2016 Medical  Record Number: [id number] Patient Account Number: [id number] Date of Birth/Sex: Apr 07, 1956 (61 y.o. Female) Treating RN: Afful, RN, BSN, Velva Harman Primary Care Provider: Carmon Ginsberg Other Clinician: Referring Provider: Carmon Ginsberg Treating Provider/Extender: Christin Fudge Service Line: Outpatient Weeks in Treatment: 4 Diagnosis Coding ICD-10 Codes Code Description [phone] Type 2 diabetes mellitus with other skin ulcer L73.2 Hidradenitis suppurativa S21.90XA Unspecified open wound of unspecified part of thorax, initial encounter Facility Procedures CPT4 Code: [id number] Description: [phone] - WOUND CARE VISIT-LEV 2 EST PT Modifier: Quantity: 1 Physician Procedures CPT4 Code Description: [id number] - WC PHYS LEVEL 3 - EST PT ICD-10 Description Diagnosis E11.622 Type 2 diabetes mellitus with other skin ulcer L73.2 Hidradenitis suppurativa S21.90XA Unspecified open wound of unspecified part of thor Modifier: ax, initial en Quantity: 1 counter Electronic Signature(s) Signed: 08/19/2016 1:58:53 PM By: Christin Fudge MD, FACS Entered By: Christin Fudge on 08/19/2016 13:58:53

## 2016-08-26 ENCOUNTER — Encounter: Payer: Self-pay | Attending: Surgery | Admitting: Surgery

## 2016-08-26 DIAGNOSIS — X58XXXA Exposure to other specified factors, initial encounter: Secondary | ICD-10-CM | POA: Insufficient documentation

## 2016-08-26 DIAGNOSIS — S2190XA Unspecified open wound of unspecified part of thorax, initial encounter: Secondary | ICD-10-CM | POA: Insufficient documentation

## 2016-08-26 DIAGNOSIS — J449 Chronic obstructive pulmonary disease, unspecified: Secondary | ICD-10-CM | POA: Insufficient documentation

## 2016-08-26 DIAGNOSIS — I10 Essential (primary) hypertension: Secondary | ICD-10-CM | POA: Insufficient documentation

## 2016-08-26 DIAGNOSIS — R42 Dizziness and giddiness: Secondary | ICD-10-CM | POA: Insufficient documentation

## 2016-08-26 DIAGNOSIS — Z8673 Personal history of transient ischemic attack (TIA), and cerebral infarction without residual deficits: Secondary | ICD-10-CM | POA: Insufficient documentation

## 2016-08-26 DIAGNOSIS — Z87891 Personal history of nicotine dependence: Secondary | ICD-10-CM | POA: Insufficient documentation

## 2016-08-26 DIAGNOSIS — E11622 Type 2 diabetes mellitus with other skin ulcer: Secondary | ICD-10-CM | POA: Insufficient documentation

## 2016-08-26 DIAGNOSIS — L732 Hidradenitis suppurativa: Secondary | ICD-10-CM | POA: Insufficient documentation

## 2016-08-27 NOTE — Progress Notes (Signed)
Kristin Mora, Kristin Mora ([id number]) Visit Report for 08/26/2016 Arrival Information Details Patient Name: Kristin Mora, Kristin Mora. Date of Service: 08/26/2016 3:00 PM Medical Record Number: [id number] Patient Account Number: [id number] Date of Birth/Sex: 06/09/1956 (61 y.o. Female) Treating RN: Afful, RN, BSN, Velva Harman Primary Care Hadasa Gasner: Carmon Ginsberg Other Clinician: Referring Luccia Reinheimer: Carmon Ginsberg Treating Stewart Sasaki/Extender: Frann Rider in Treatment: 5 Visit Information History Since Last Visit All ordered tests and consults were completed: No Patient Arrived: Ambulatory Added or deleted any medications: No Arrival Time: 15:09 Any new allergies or adverse reactions: No Accompanied By: self Had a fall or experienced change in No Transfer Assistance: None activities of daily living that may affect Patient Identification Verified: Yes risk of falls: Secondary Verification Process Yes Signs or symptoms of abuse/neglect since last No Completed: visito Patient Requires Transmission- No Hospitalized since last visit: No Based Precautions: Has Dressing in Place as Prescribed: Yes Patient Has Alerts: Yes Pain Present Now: No Patient Alerts: Type II Diabetic Electronic Signature(s) Signed: 08/26/2016 4:12:10 PM By: Regan Lemming BSN, RN Entered By: Regan Lemming on 08/26/2016 15:10:24 Kristin Mora ([id number]) -------------------------------------------------------------------------------- Clinic Level of Care Assessment Details Patient Name: Kristin Mora Date of Service: 08/26/2016 3:00 PM Medical Record Number: [id number] Patient Account Number: [id number] Date of Birth/Sex: [birth year] (61 y.o. Female) Treating RN: Afful, RN, BSN, [place] Primary Care Chritopher Coster: Carmon Ginsberg Other Clinician: Referring Bristol Soy: Carmon Ginsberg Treating Morgen Ritacco/Extender: Frann Rider in Treatment: 5 Clinic Level of Care Assessment Items TOOL 4 Quantity Score []  - Use when only an  EandM is performed on FOLLOW-UP visit 0 ASSESSMENTS - Nursing Assessment / Reassessment X - Reassessment of Co-morbidities (includes updates in patient status) 1 10 X - Reassessment of Adherence to Treatment Plan 1 5 ASSESSMENTS - Wound and Skin Assessment / Reassessment X - Simple Wound Assessment / Reassessment - one wound 1 5 []  - Complex Wound Assessment / Reassessment - multiple wounds 0 []  - Dermatologic / Skin Assessment (not related to wound area) 0 ASSESSMENTS - Focused Assessment []  - Circumferential Edema Measurements - multi extremities 0 []  - Nutritional Assessment / Counseling / Intervention 0 []  - Lower Extremity Assessment (monofilament, tuning fork, pulses) 0 []  - Peripheral Arterial Disease Assessment (using hand held doppler) 0 ASSESSMENTS - Ostomy and/or Continence Assessment and Care []  - Incontinence Assessment and Management 0 []  - Ostomy Care Assessment and Management (repouching, etc.) 0 PROCESS - Coordination of Care X - Simple Patient / Family Education for ongoing care 1 15 []  - Complex (extensive) Patient / Family Education for ongoing care 0 []  - Staff obtains Programmer, systems, Records, Test Results / Process Orders 0 []  - Staff telephones HHA, Nursing Homes / Clarify orders / etc 0 []  - Routine Transfer to another Facility (non-emergent condition) 0 Kristin Mora, Kristin Mora ([id number]) []  - Routine Hospital Admission (non-emergent condition) 0 []  - New Admissions / Biomedical engineer / Ordering NPWT, Apligraf, etc. 0 []  - Emergency Hospital Admission (emergent condition) 0 []  - Simple Discharge Coordination 0 []  - Complex (extensive) Discharge Coordination 0 PROCESS - Special Needs []  - Pediatric / Minor Patient Management 0 []  - Isolation Patient Management 0 []  - Hearing / Language / Visual special needs 0 []  - Assessment of Community assistance (transportation, D/C planning, etc.) 0 []  - Additional assistance / Altered mentation 0 []  - Support Surface(s)  Assessment (bed, cushion, seat, etc.) 0 INTERVENTIONS - Wound Cleansing / Measurement []  - Simple Wound Cleansing - one wound 0 []  - Complex Wound Cleansing -  multiple wounds 0 X - Wound Imaging (photographs - any number of wounds) 1 5 []  - Wound Tracing (instead of photographs) 0 []  - Simple Wound Measurement - one wound 0 []  - Complex Wound Measurement - multiple wounds 0 INTERVENTIONS - Wound Dressings []  - Small Wound Dressing one or multiple wounds 0 []  - Medium Wound Dressing one or multiple wounds 0 []  - Large Wound Dressing one or multiple wounds 0 []  - Application of Medications - topical 0 []  - Application of Medications - injection 0 INTERVENTIONS - Miscellaneous []  - External ear exam 0 Kristin Mora, Kristin Mora ([id number]) []  - Specimen Collection (cultures, biopsies, blood, body fluids, etc.) 0 []  - Specimen(s) / Culture(s) sent or taken to Lab for analysis 0 []  - Patient Transfer (multiple staff / Harrel Lemon Lift / Similar devices) 0 []  - Simple Staple / Suture removal (25 or less) 0 []  - Complex Staple / Suture removal (26 or more) 0 []  - Hypo / Hyperglycemic Management (close monitor of Blood Glucose) 0 []  - Ankle / Brachial Index (ABI) - do not check if billed separately 0 X - Vital Signs 1 5 Has the patient been seen at the hospital within the last three years: Yes Total Score: 45 Level Of Care: New/Established - Level 2 Electronic Signature(s) Signed: 08/26/2016 4:12:10 PM By: Regan Lemming BSN, RN Entered By: Regan Lemming on 08/26/2016 15:19:38 Kristin Mora ([id number]) -------------------------------------------------------------------------------- Encounter Discharge Information Details Patient Name: Kristin Mora Date of Service: 08/26/2016 3:00 PM Medical Record Number: [id number] Patient Account Number: [id number] Date of Birth/Sex: 08-Oct-1955 (61 y.o. Female) Treating RN: Baruch Gouty, RN, BSN, Velva Harman Primary Care Elexia Friedt: Carmon Ginsberg Other Clinician: Referring  Nikita Surman: Carmon Ginsberg Treating Channon Ambrosini/Extender: Frann Rider in Treatment: 5 Encounter Discharge Information Items Discharge Pain Level: 0 Discharge Condition: Stable Ambulatory Status: Ambulatory Discharge Destination: Home Transportation: Private Auto Accompanied By: sxelf Schedule Follow-up Appointment: No Medication Reconciliation completed and provided to Patient/Care No Kristin Mora: Provided on Clinical Summary of Care: 08/26/2016 Form Type Recipient Paper Patient JP Electronic Signature(s) Signed: 08/26/2016 3:23:58 PM By: Sharon Mt Entered By: Sharon Mt on 08/26/2016 15:23:58 Kristin Mora ([id number]) -------------------------------------------------------------------------------- Lower Extremity Assessment Details Patient Name: Kristin Mora Date of Service: 08/26/2016 3:00 PM Medical Record Number: [id number] Patient Account Number: [id number] Date of Birth/Sex: 04-Aug-1955 (61 y.o. Female) Treating RN: Afful, RN, BSN, [employer] Primary Care Pope Brunty: Carmon Ginsberg Other Clinician: Referring Yida Hyams: Carmon Ginsberg Treating Rylin Seavey/Extender: Frann Rider in Treatment: 5 Electronic Signature(s) Signed: 08/26/2016 4:12:10 PM By: Regan Lemming BSN, RN Entered By: Regan Lemming on 08/26/2016 15:18:46 Kristin Mora ([id number]) -------------------------------------------------------------------------------- Multi Wound Chart Details Patient Name: Kristin Mora Date of Service: 08/26/2016 3:00 PM Medical Record Number: [id number] Patient Account Number: [id number] Date of Birth/Sex: 1955-09-13 (61 y.o. Female) Treating RN: Baruch Gouty, RN, BSN, Velva Harman Primary Care Navdeep Fessenden: Carmon Ginsberg Other Clinician: Referring Larkin Morelos: Carmon Ginsberg Treating Aiyla Baucom/Extender: Frann Rider in Treatment: 5 Vital Signs Height(in): 62 Pulse(bpm): 76 Weight(lbs): 296 Blood Pressure 186/77 (mmHg): Body Mass Index(BMI): 54 Temperature(F):  98.3 Respiratory Rate 17 (breaths/min): Photos: [1:No Photos] [N/A:N/A] Wound Location: [1:Left Axilla] [N/A:N/A] Wounding Event: [1:Gradually Appeared] [N/A:N/A] Primary Etiology: [1:Abscess] [N/A:N/A] Comorbid History: [1:Hypertension, Type II Diabetes] [N/A:N/A] Date Acquired: [1:05/23/2016] [N/A:N/A] Weeks of Treatment: [1:5] [N/A:N/A] Wound Status: [1:Healed - Epithelialized] [N/A:N/A] Measurements L x W x D 0x0x0 [N/A:N/A] (cm) Area (cm) : [1:0] [N/A:N/A] Volume (cm) : [1:0] [N/A:N/A] % Reduction in Area: [1:100.00%] [N/A:N/A] % Reduction in Volume: 100.00% [N/A:N/A] Classification: [1:Partial Thickness] [N/A:N/A]  Exudate Amount: [1:None Present] [N/A:N/A] Wound Margin: [1:Flat and Intact] [N/A:N/A] Granulation Amount: [1:None Present (0%)] [N/A:N/A] Necrotic Amount: [1:None Present (0%)] [N/A:N/A] Exposed Structures: [1:Fascia: No Fat Layer (Subcutaneous Tissue) Exposed: No Tendon: No Muscle: No Joint: No Bone: No Limited to Skin Breakdown] [N/A:N/A] Epithelialization: [1:Large (67-100%)] [N/A:N/A] Periwound Skin Texture: Excoriation: No N/A N/A Induration: No Callus: No Crepitus: No Rash: No Scarring: No Periwound Skin Dry/Scaly: Yes N/A N/A Moisture: Maceration: No Periwound Skin Color: Atrophie Blanche: No N/A N/A Cyanosis: No Ecchymosis: No Erythema: No Hemosiderin Staining: No Mottled: No Pallor: No Rubor: No Tenderness on No N/A N/A Palpation: Wound Preparation: Ulcer Cleansing: N/A N/A Rinsed/Irrigated with Saline Topical Anesthetic Applied: None Treatment Notes Electronic Signature(s) Signed: 08/26/2016 4:12:10 PM By: Regan Lemming BSN, RN Previous Signature: 08/26/2016 3:19:31 PM Version By: Christin Fudge MD, FACS Entered By: Regan Lemming on 08/26/2016 15:20:22 Kristin Mora ([id number]) -------------------------------------------------------------------------------- [place] Details Patient Name: Kristin Mora Date  of Service: 08/26/2016 3:00 PM Medical Record Number: [id number] Patient Account Number: [id number] Date of Birth/Sex: [birth year] (62 y.o. Female) Treating RN: Afful, RN, BSN, [employer] Primary Care Malayshia All: Carmon Ginsberg Other Clinician: Referring Marshall Kampf: Carmon Ginsberg Treating Kayliegh Boyers/Extender: Frann Rider in Treatment: 5 Active Inactive Electronic Signature(s) Signed: 08/26/2016 4:12:10 PM By: Regan Lemming BSN, RN Entered By: Regan Lemming on 08/26/2016 15:20:11 Kristin Mora ([id number]) -------------------------------------------------------------------------------- Pain Assessment Details Patient Name: Kristin Mora Date of Service: 08/26/2016 3:00 PM Medical Record Number: [id number] Patient Account Number: [id number] Date of Birth/Sex: 25-Jul-1955 (61 y.o. Female) Treating RN: Baruch Gouty, RN, BSN, Velva Harman Primary Care Kita Neace: Carmon Ginsberg Other Clinician: Referring Jacelyn Cuen: Carmon Ginsberg Treating Suriya Kovarik/Extender: Frann Rider in Treatment: 5 Active Problems Location of Pain Severity and Description of Pain Patient Has Paino No Site Locations With Dressing Change: No Pain Management and Medication Current Pain Management: Electronic Signature(s) Signed: 08/26/2016 4:12:10 PM By: Regan Lemming BSN, RN Entered By: Regan Lemming on 08/26/2016 15:10:31 Kristin Mora ([id number]) -------------------------------------------------------------------------------- Patient/Caregiver Education Details Patient Name: Kristin Mora Date of Service: 08/26/2016 3:00 PM Medical Record Number: [id number] Patient Account Number: [id number] Date of Birth/Gender: Nov 05, 1955 (61 y.o. Female) Treating RN: Afful, RN, BSN, Velva Harman Primary Care Physician: Carmon Ginsberg Other Clinician: Referring Physician: Carmon Ginsberg Treating Physician/Extender: Frann Rider in Treatment: 5 Education Assessment Education Provided To: Patient Education Topics Provided Electronic  Signature(s) Signed: 08/26/2016 4:12:10 PM By: Regan Lemming BSN, RN Entered By: Regan Lemming on 08/26/2016 15:20:52 Kristin Mora ([id number]) -------------------------------------------------------------------------------- Wound Assessment Details Patient Name: Kristin Mora Date of Service: 08/26/2016 3:00 PM Medical Record Number: [id number] Patient Account Number: [id number] Date of Birth/Sex: 08-09-1955 (61 y.o. Female) Treating RN: Afful, RN, BSN, [place] Primary Care Alissandra Geoffroy: Carmon Ginsberg Other Clinician: Referring Jermar Colter: Carmon Ginsberg Treating Quintella Mura/Extender: Frann Rider in Treatment: 5 Wound Status Wound Number: 1 Primary Etiology: Abscess Wound Location: Left Axilla Wound Status: Healed - Epithelialized Wounding Event: Gradually Appeared Comorbid History: Hypertension, Type II Diabetes Date Acquired: 05/23/2016 Weeks Of Treatment: 5 Clustered Wound: No Photos Photo Uploaded By: Regan Lemming on 08/26/2016 15:35:56 Wound Measurements Length: (cm) 0 % Reduction i Width: (cm) 0 % Reduction i Depth: (cm) 0 Epithelializa Area: (cm) 0 Tunneling: Volume: (cm) 0 Undermining: n Area: 100% n Volume: 100% tion: Large (67-100%) No No Wound Description Classification: Partial Thickness Foul Odor Aft Wound Margin: Flat and Intact Slough/Fibrin Exudate Amount: None Present er Cleansing: No o No Wound Bed Granulation Amount: None Present (0%) Exposed Structure Necrotic Amount: None Present (0%)  Fascia Exposed: No Fat Layer (Subcutaneous Tissue) Exposed: No Tendon Exposed: No Muscle Exposed: No Joint Exposed: No Kristin Mora, Kristin Mora ([id number]) Bone Exposed: No Limited to Skin Breakdown Periwound Skin Texture Texture Color No Abnormalities Noted: No No Abnormalities Noted: No Callus: No Atrophie Blanche: No Crepitus: No Cyanosis: No Excoriation: No Ecchymosis: No Induration: No Erythema: No Rash: No Hemosiderin Staining: No Scarring:  No Mottled: No Pallor: No Moisture Rubor: No No Abnormalities Noted: No Dry / Scaly: Yes Maceration: No Wound Preparation Ulcer Cleansing: Rinsed/Irrigated with Saline Topical Anesthetic Applied: None Electronic Signature(s) Signed: 08/26/2016 4:12:10 PM By: Regan Lemming BSN, RN Entered By: Regan Lemming on 08/26/2016 15:15:50 Kristin Mora ([id number]) -------------------------------------------------------------------------------- Vitals Details Patient Name: Kristin Mora Date of Service: 08/26/2016 3:00 PM Medical Record Number: [id number] Patient Account Number: [id number] Date of Birth/Sex: 1955-09-08 (61 y.o. Female) Treating RN: Afful, RN, BSN, [place] Primary Care Bastian Andreoli: Carmon Ginsberg Other Clinician: Referring Ahmadou Bolz: Carmon Ginsberg Treating Verlee Pope/Extender: Frann Rider in Treatment: 5 Vital Signs Time Taken: 15:10 Temperature (F): 98.3 Height (in): 62 Pulse (bpm): 76 Weight (lbs): 296 Respiratory Rate (breaths/min): 17 Body Mass Index (BMI): 54.1 Blood Pressure (mmHg): 186/77 Reference Range: 80 - 120 mg / dl Electronic Signature(s) Signed: 08/26/2016 4:12:10 PM By: Regan Lemming BSN, RN Entered By: Regan Lemming on 08/26/2016 15:12:22

## 2016-08-27 NOTE — Progress Notes (Signed)
JAXI, LIPPE ([id number]) Visit Report for 08/26/2016 Chief Complaint Document Details Patient Name: Kristin Mora, Kristin Mora. Date of Service: 08/26/2016 3:00 PM Medical Record Number: [id number] Patient Account Number: [id number] Date of Birth/Sex: 1955/08/12 (61 y.o. Female) Treating RN: Baruch Gouty, RN, BSN, Velva Harman Primary Care Provider: Carmon Ginsberg Other Clinician: Referring Provider: Carmon Ginsberg Treating Provider/Extender: Frann Rider in Treatment: 5 Information Obtained from: Patient Chief Complaint Patient seen for complaints of Non-Healing Wound the left axillary area which she's had for at least 2 months Electronic Signature(s) Signed: 08/26/2016 3:19:37 PM By: Christin Fudge MD, FACS Entered By: Christin Fudge on 08/26/2016 15:19:37 Kristin Mora ([id number]) -------------------------------------------------------------------------------- HPI Details Patient Name: Kristin Mora Date of Service: 08/26/2016 3:00 PM Medical Record Number: [id number] Patient Account Number: [id number] Date of Birth/Sex: July 19, 1955 (61 y.o. Female) Treating RN: Baruch Gouty, RN, BSN, Velva Harman Primary Care Provider: Carmon Ginsberg Other Clinician: Referring Provider: Carmon Ginsberg Treating Provider/Extender: Frann Rider in Treatment: 5 History of Present Illness Location: after axillary area Quality: Patient reports No Pain. Severity: Patient states wound (s) are getting better. Duration: Patient has had the wound for > 2 months prior to seeking treatment at the wound center Context: The wound appeared gradually over time Modifying Factors: Other treatment(s) tried include:doxycycline and clindamycin gel Associated Signs and Symptoms: Patient reports having foul odor. HPI Description: 61 year old patient who has a past medical history significant for diabetes mellitus, essential hypertension, COPD, CVA, axillary hidradenitis repetitive, vertigo, recently presented to the emergency  department with hydradenitis suppurtiva, which is normally managed by her PCP and currently takes doxycycline. she is a former smoker and quit in 2011. ER advised her clindamycin gel locally and to follow-up with the wound center. asked hemoglobin A1c done 3 weeks ago was 6.5%. 07/28/2016 -- her dermatology appointment is not until the middle of March. 08/05/2016 -- the patient has been doing the dressing as planned and has no fresh issue. Electronic Signature(s) Signed: 08/26/2016 3:19:42 PM By: Christin Fudge MD, FACS Entered By: Christin Fudge on 08/26/2016 15:19:41 Kristin Mora ([id number]) -------------------------------------------------------------------------------- Physical Exam Details Patient Name: Kristin Mora Date of Service: 08/26/2016 3:00 PM Medical Record Number: [id number] Patient Account Number: [id number] Date of Birth/Sex: Aug 03, 1955 (61 y.o. Female) Treating RN: Baruch Gouty, RN, BSN, Velva Harman Primary Care Provider: Carmon Ginsberg Other Clinician: Referring Provider: Carmon Ginsberg Treating Provider/Extender: Frann Rider in Treatment: 5 Constitutional . Pulse regular. Respirations normal and unlabored. Afebrile. . Eyes Nonicteric. Reactive to light. Ears, Nose, Mouth, and Throat Lips, teeth, and gums WNL.Marland Kitchen Moist mucosa without lesions. Neck supple and nontender. No palpable supraclavicular or cervical adenopathy. Normal sized without goiter. Respiratory WNL. No retractions.. Cardiovascular Pedal Pulses WNL. No clubbing, cyanosis or edema. Chest Breasts symmetical and no nipple discharge.. Breast tissue WNL, no masses, lumps, or tenderness.. Lymphatic No adneopathy. No adenopathy. No adenopathy. Musculoskeletal Adexa without tenderness or enlargement.. Digits and nails w/o clubbing, cyanosis, infection, petechiae, ischemia, or inflammatory conditions.. Integumentary (Hair, Skin) No suspicious lesions. No crepitus or fluctuance. No peri-wound warmth or  erythema. No masses.Marland Kitchen Psychiatric Judgement and insight Intact.. No evidence of depression, anxiety, or agitation.. Notes the wound is clean and there are no open ulcerations in this area. Electronic Signature(s) Signed: 08/26/2016 3:20:09 PM By: Christin Fudge MD, FACS Entered By: Christin Fudge on 08/26/2016 15:20:08 Kristin Mora ([id number]) -------------------------------------------------------------------------------- Physician Orders Details Patient Name: Kristin Mora Date of Service: 08/26/2016 3:00 PM Medical Record Number: [id number] Patient Account Number: [id number] Date of Birth/Sex: 1955/08/28 (61 y.o. Female) Treating  RN: Baruch Gouty, RN, BSN, [employer] Primary Care Provider: Carmon Ginsberg Other Clinician: Referring Provider: Carmon Ginsberg Treating Provider/Extender: Frann Rider in Treatment: 5 Verbal / Phone Orders: No Diagnosis Coding Discharge From [facility] Services o Discharge from [place] Completed Electronic Signature(s) Signed: 08/26/2016 3:20:13 PM By: Christin Fudge MD, FACS Entered By: Christin Fudge on 08/26/2016 15:20:13 Kristin Mora ([id number]) -------------------------------------------------------------------------------- Problem List Details Patient Name: Kristin Mora Date of Service: 08/26/2016 3:00 PM Medical Record Number: [id number] Patient Account Number: [id number] Date of Birth/Sex: 09/04/1955 (61 y.o. Female) Treating RN: Baruch Gouty, RN, BSN, Velva Harman Primary Care Provider: Carmon Ginsberg Other Clinician: Referring Provider: Carmon Ginsberg Treating Provider/Extender: Frann Rider in Treatment: 5 Active Problems ICD-10 Encounter Code Description Active Date Diagnosis E11.622 Type 2 diabetes mellitus with other skin ulcer 07/21/2016 Yes L73.2 Hidradenitis suppurativa 07/21/2016 Yes S21.90XA Unspecified open wound of unspecified part of thorax, 07/21/2016 Yes initial encounter Inactive Problems Resolved  Problems Electronic Signature(s) Signed: 08/26/2016 3:19:27 PM By: Christin Fudge MD, FACS Entered By: Christin Fudge on 08/26/2016 15:19:27 Kristin Mora ([id number]) -------------------------------------------------------------------------------- Progress Note Details Patient Name: Kristin Mora Date of Service: 08/26/2016 3:00 PM Medical Record Number: [id number] Patient Account Number: [id number] Date of Birth/Sex: [birth year] (61 y.o. Female) Treating RN: Afful, RN, BSN, Velva Harman Primary Care Provider: Carmon Ginsberg Other Clinician: Referring Provider: Carmon Ginsberg Treating Provider/Extender: Frann Rider in Treatment: 5 Subjective Chief Complaint Information obtained from Patient Patient seen for complaints of Non-Healing Wound the left axillary area which she's had for at least 2 months History of Present Illness (HPI) The following HPI elements were documented for the patient's wound: Location: after axillary area Quality: Patient reports No Pain. Severity: Patient states wound (s) are getting better. Duration: Patient has had the wound for > 2 months prior to seeking treatment at the wound center Context: The wound appeared gradually over time Modifying Factors: Other treatment(s) tried include:doxycycline and clindamycin gel Associated Signs and Symptoms: Patient reports having foul odor. 61 year old patient who has a past medical history significant for diabetes mellitus, essential hypertension, COPD, CVA, axillary hidradenitis repetitive, vertigo, recently presented to the emergency department with hydradenitis suppurtiva, which is normally managed by her PCP and currently takes doxycycline. she is a former smoker and quit in 2011. ER advised her clindamycin gel locally and to follow-up with the wound center. asked hemoglobin A1c done 3 weeks ago was 6.5%. 07/28/2016 -- her dermatology appointment is not until the middle of March. 08/05/2016 -- the patient has  been doing the dressing as planned and has no fresh issue. Objective Constitutional Pulse regular. Respirations normal and unlabored. Afebrile. Vitals Time Taken: 3:10 PM, Height: 62 in, Weight: 296 lbs, BMI: 54.1, Temperature: 98.3 F, Pulse: 76 bpm, Respiratory Rate: 17 breaths/min, Blood Pressure: 186/77 mmHg. Kristin Mora, Kristin Mora ([id number]) Eyes Nonicteric. Reactive to light. Ears, Nose, Mouth, and Throat Lips, teeth, and gums WNL.Marland Kitchen Moist mucosa without lesions. Neck supple and nontender. No palpable supraclavicular or cervical adenopathy. Normal sized without goiter. Respiratory WNL. No retractions.. Cardiovascular Pedal Pulses WNL. No clubbing, cyanosis or edema. Chest Breasts symmetical and no nipple discharge.. Breast tissue WNL, no masses, lumps, or tenderness.. Lymphatic No adneopathy. No adenopathy. No adenopathy. Musculoskeletal Adexa without tenderness or enlargement.. Digits and nails w/o clubbing, cyanosis, infection, petechiae, ischemia, or inflammatory conditions.Marland Kitchen Psychiatric Judgement and insight Intact.. No evidence of depression, anxiety, or agitation.. General Notes: the wound is clean and there are no open ulcerations in this area. Integumentary (Hair, Skin) No suspicious  lesions. No crepitus or fluctuance. No peri-wound warmth or erythema. No masses.. Wound #1 status is Healed - Epithelialized. Original cause of wound was Gradually Appeared. The wound is located on the Left Axilla. The wound measures 0cm length x 0cm width x 0cm depth; 0cm^2 area and 0cm^3 volume. The wound is limited to skin breakdown. There is no tunneling or undermining noted. There is a none present amount of drainage noted. The wound margin is flat and intact. There is no granulation within the wound bed. There is no necrotic tissue within the wound bed. The periwound skin appearance exhibited: Dry/Scaly. The periwound skin appearance did not exhibit: Callus, Crepitus,  Excoriation, Induration, Rash, Scarring, Maceration, Atrophie Blanche, Cyanosis, Ecchymosis, Hemosiderin Staining, Mottled, Pallor, Rubor, Erythema. Assessment Active Problems Kristin Mora, Kristin Mora ([id number]) ICD-10 E11.622 - Type 2 diabetes mellitus with other skin ulcer L73.2 - Hidradenitis suppurativa S21.90XA - Unspecified open wound of unspecified part of thorax, initial encounter Plan Discharge From [facility] Services: Discharge from [place] Completed her wound is healed and I have asked her to keep this area clean and dry and she is discharged on the wound care services and was seen back only if needed. She will follow-up with her dermatologist soon. Electronic Signature(s) Signed: 08/26/2016 3:20:38 PM By: Christin Fudge MD, FACS Entered By: Christin Fudge on 08/26/2016 15:20:38 Kristin Mora ([id number]) -------------------------------------------------------------------------------- SuperBill Details Patient Name: Kristin Mora Date of Service: 08/26/2016 Medical Record Number: [id number] Patient Account Number: [id number] Date of Birth/Sex: 1956-02-13 (61 y.o. Female) Treating RN: Afful, RN, BSN, Velva Harman Primary Care Provider: Carmon Ginsberg Other Clinician: Referring Provider: Carmon Ginsberg Treating Provider/Extender: Christin Fudge Service Line: Outpatient Weeks in Treatment: 5 Diagnosis Coding ICD-10 Codes Code Description [phone] Type 2 diabetes mellitus with other skin ulcer L73.2 Hidradenitis suppurativa S21.90XA Unspecified open wound of unspecified part of thorax, initial encounter Facility Procedures CPT4 Code: [id number] Description: [phone] - WOUND CARE VISIT-LEV 2 EST PT Modifier: Quantity: 1 Physician Procedures CPT4 Code Description: [id number] - WC PHYS LEVEL 2 - EST PT ICD-10 Description Diagnosis E11.622 Type 2 diabetes mellitus with other skin ulcer L73.2 Hidradenitis suppurativa S21.90XA Unspecified open wound of unspecified part of  thor Modifier: ax, initial en Quantity: 1 counter Electronic Signature(s) Signed: 08/26/2016 3:20:52 PM By: Christin Fudge MD, FACS Entered By: Christin Fudge on 08/26/2016 15:20:52

## 2017-01-24 ENCOUNTER — Encounter: Payer: Self-pay | Admitting: Emergency Medicine

## 2017-01-24 ENCOUNTER — Emergency Department
Admission: EM | Admit: 2017-01-24 | Discharge: 2017-01-24 | Disposition: A | Discharge status: Home / Self Care | Payer: Self-pay | Attending: Emergency Medicine | Admitting: Emergency Medicine

## 2017-01-24 DIAGNOSIS — H1089 Other conjunctivitis: Secondary | ICD-10-CM | POA: Insufficient documentation

## 2017-01-24 DIAGNOSIS — Z7984 Long term (current) use of oral hypoglycemic drugs: Secondary | ICD-10-CM | POA: Insufficient documentation

## 2017-01-24 DIAGNOSIS — Z7902 Long term (current) use of antithrombotics/antiplatelets: Secondary | ICD-10-CM | POA: Insufficient documentation

## 2017-01-24 DIAGNOSIS — Z8673 Personal history of transient ischemic attack (TIA), and cerebral infarction without residual deficits: Secondary | ICD-10-CM | POA: Insufficient documentation

## 2017-01-24 DIAGNOSIS — H1032 Unspecified acute conjunctivitis, left eye: Secondary | ICD-10-CM

## 2017-01-24 DIAGNOSIS — Z79899 Other long term (current) drug therapy: Secondary | ICD-10-CM | POA: Insufficient documentation

## 2017-01-24 DIAGNOSIS — E119 Type 2 diabetes mellitus without complications: Secondary | ICD-10-CM | POA: Insufficient documentation

## 2017-01-24 DIAGNOSIS — I10 Essential (primary) hypertension: Secondary | ICD-10-CM | POA: Insufficient documentation

## 2017-01-24 DIAGNOSIS — Z87891 Personal history of nicotine dependence: Secondary | ICD-10-CM | POA: Insufficient documentation

## 2017-01-24 DIAGNOSIS — Z91040 Latex allergy status: Secondary | ICD-10-CM | POA: Insufficient documentation

## 2017-01-24 DIAGNOSIS — J45909 Unspecified asthma, uncomplicated: Secondary | ICD-10-CM | POA: Insufficient documentation

## 2017-01-24 MED ORDER — FLUORESCEIN SODIUM 0.6 MG OP STRP
1.0000 | ORAL_STRIP | Freq: Once | OPHTHALMIC | Status: AC
  Administered 2017-01-24: 1 via OPHTHALMIC
  Filled 2017-01-24: qty 1

## 2017-01-24 MED ORDER — GENTAMICIN SULFATE 0.3 % OP SOLN: 1.0000 [drp] | Freq: Four times a day (QID) | OPHTHALMIC | 0 refills | Status: AC

## 2017-01-24 MED ORDER — ERYTHROMYCIN 5 MG/GM OP OINT
1.0000 "application " | TOPICAL_OINTMENT | Freq: Four times a day (QID) | OPHTHALMIC | Status: DC
  Administered 2017-01-24: 1 via OPHTHALMIC
  Filled 2017-01-24: qty 1

## 2017-01-24 NOTE — ED Notes (Signed)
See triage note  States she accidentally hit her left eye about 2 days ago  Left eye swollen and bruised

## 2017-01-24 NOTE — ED Notes (Addendum)
Visual Acuity screen performed:  Left eye; 20/50 Right eye; 20/30

## 2017-01-24 NOTE — ED Provider Notes (Signed)
[facility] Emergency Department Provider Note ____________________________________________  Time seen: 49  I have reviewed the triage vital signs and the nursing notes.  HISTORY  Chief Complaint  Eye Drainage  HPI Kristin Mora is a 61 y.o. female presents to the ED for evaluation of left eye redness, irritation, and drainage. Patient admits accidentally hit herself in the eye 2 days prior. Since that time he's had increased redness, pain, and purulent discharge. She also notes some swelling to the eyelid as well. Denies any nausea, vomiting, visual disturbance, or any light sensitivity. She has been using an OTC allergy eye drop without relief.   Past Medical History:  Diagnosis Date  . Asthma   . Brain aneurysm 04/2007  . Diabetes mellitus without complication ([place])    controlled;  . HLD (hyperlipidemia)   . Hypertension    controlled with medication  . Stroke ([place])   . Vertigo     Patient Active Problem List   Diagnosis Date Noted  . Axillary hidradenitis suppurativa 06/29/2016  . Stroke ([place]) 09/15/2015  . Vertigo 09/14/2015  . Chronic airway obstruction ([place]) 11/04/2014  . Avitaminosis D 11/04/2014  . Adiposity 11/04/2014  . Hypercholesteremia 11/04/2014  . Hemorrhagic cerebrovascular accident (CVA) ([place]) 11/04/2014  . Diabetes mellitus, type 2 ([place]) 10/01/2014  . Essential (primary) hypertension 10/01/2014  . Thalamic hemorrhage ([place]) 10/01/2014    Past Surgical History:  Procedure Laterality Date  . CRANIOTOMY  04/2007  . KNEE SURGERY    . TONSILLECTOMY AND ADENOIDECTOMY    . TUBAL LIGATION      Prior to Admission medications   Medication Sig Start Date End Date Taking? Authorizing Provider  amLODipine (NORVASC) 5 MG tablet Take 1 tablet (5 mg total) by mouth daily. 05/29/15   Carmon Ginsberg, PA  atorvastatin (LIPITOR) 40 MG tablet Take 1 tablet (40 mg total) by mouth every evening. 09/17/15   Vaughan Basta, MD   clindamycin (CLINDAGEL) 1 % gel Apply to affected area 2 times daily 07/10/16 07/10/17  Lannie Fields, PA-C  clopidogrel (PLAVIX) 75 MG tablet Take 1 tablet (75 mg total) by mouth daily. 09/17/15   Vaughan Basta, MD  diazepam (VALIUM) 5 MG tablet Take 1.5 tablets (7.5 mg total) by mouth every 8 (eight) hours as needed (vertigo). ONLY IF NEEDED. 09/17/15   Vaughan Basta, MD  doxycycline (VIBRA-TABS) 100 MG tablet Take 1 tablet (100 mg total) by mouth 2 (two) times daily. 06/29/16   Carmon Ginsberg, PA  gabapentin (NEURONTIN) 600 MG tablet Take 0.5 tablets (300 mg total) by mouth 2 (two) times daily. 09/17/15   Vaughan Basta, MD  gentamicin (GARAMYCIN) 0.3 % ophthalmic solution Place 1 drop into the left eye 4 (four) times daily. 01/24/17 02/03/17  Nayden Czajka, Dannielle Karvonen, PA-C  losartan (COZAAR) 100 MG tablet Take 1 tablet (100 mg total) by mouth daily. 12/25/15   Carmon Ginsberg, PA  meclizine (ANTIVERT) 12.5 MG tablet Take 1 tablet (12.5 mg total) by mouth 3 (three) times daily. 09/17/15   Vaughan Basta, MD  metFORMIN (GLUCOPHAGE) 500 MG tablet Take 1 tablet (500 mg total) by mouth 2 (two) times daily with a meal. 08/14/15   Carmon Ginsberg, PA  metFORMIN (GLUCOPHAGE) 500 MG tablet TAKE ONE TABLET BY MOUTH TWICE DAILY 07/25/16   Carmon Ginsberg, PA  sulfacetamide (BLEPH-10) 10 % ophthalmic solution Place 2 drops into the left eye 4 (four) times daily. 07/08/16   Carmon Ginsberg, PA    Allergies Lisinopril and Latex  Family  History  Problem Relation Age of Onset  . Heart attack Mother   . Coronary artery disease Mother   . Lung cancer Father   . Diabetes Sister   . Stroke Brother   . Uterine cancer Sister   . Lung cancer Sister   . Bone cancer Brother     Social History Social History  Substance Use Topics  . Smoking status: Former Smoker    Quit date: 11/25/2009  . Smokeless tobacco: Never Used  . Alcohol use No    Review of Systems  Constitutional:  Negative for fever. Eyes: Negative for visual changes. Left eye redness and conjunctivitis.  ENT: Negative for sore throat. Gastrointestinal: Negative for abdominal pain, vomiting and diarrhea. Skin: Negative for rash. Neurological: Negative for headaches, focal weakness or numbness. ____________________________________________  PHYSICAL EXAM:  VITAL SIGNS: ED Triage Vitals [01/24/17 1748]  Enc Vitals Group     BP (!) 212/104     Pulse Rate 86     Resp 14     Temp 98.7 F (37.1 C)     Temp Source Oral     SpO2 95 %     Weight 294 lb (133.4 kg)     Height 5\' 2"  (1.575 m)     Head Circumference      Peak Flow      Pain Score 0     Pain Loc      Pain Edu?      Excl. in [place]?    Constitutional: Alert and oriented. Well appearing and in no distress. Head: Normocephalic and atraumatic. Eyes: Conjunctivae are injected on the left. Periorbital edema and erythema noted. Purulent discharge noted. PERRL. Normal extraocular movements. No fluorescein dye uptake.  Ears: Canals clear. TMs intact bilaterally. Hematological/Lymphatic/Immunological: No preauricular lymphadenopathy. Cardiovascular: Normal rate, regular rhythm. Normal distal pulses. Respiratory: Normal respiratory effort. Neurologic:  Normal gait without ataxia. Normal speech and language. No gross focal neurologic deficits are appreciated. Skin:  Skin is warm, dry and intact. No rash noted. ____________________________________________  PROCEDURES    Visual Acuity  Right Eye Distance:  20/30  Left Eye Distance:  20/50 Bilateral Distance:     Erythromycin ophthalmic ointment - OS ____________________________________________  INITIAL IMPRESSION / ASSESSMENT AND PLAN / ED COURSE  Patient with the ED evaluation of acute bacterial conjunctivitis of the left eye. Patient likely sustained a self-inflicted corneal abrasion, but there is no fluorescein dye uptake on evaluation today. She will be discharged with a prescription  for Garamycin ophthalmic solution to use as directed. She is referred to Dr. Edison Pace [facility] for further evaluation and management. ____________________________________________  FINAL CLINICAL IMPRESSION(S) / ED DIAGNOSES  Final diagnoses:  Acute bacterial conjunctivitis of left eye      Carmie End, Dannielle Karvonen, PA-C 01/24/17 2344    Nance Pear, MD 01/24/17 [phone]

## 2017-01-24 NOTE — Discharge Instructions (Signed)
Use the eye drops as directed. Follow-up with Dr. Edison Pace as needed.

## 2017-01-24 NOTE — ED Triage Notes (Signed)
Pt with left eye redness and drainage.

## 2017-03-02 ENCOUNTER — Other Ambulatory Visit: Payer: Self-pay | Admitting: Family Medicine

## 2017-03-02 DIAGNOSIS — I10 Essential (primary) hypertension: Secondary | ICD-10-CM

## 2017-03-21 ENCOUNTER — Encounter: Payer: Self-pay | Admitting: Family Medicine

## 2017-04-03 ENCOUNTER — Emergency Department: Payer: Medicare Other

## 2017-04-03 ENCOUNTER — Emergency Department
Admission: EM | Admit: 2017-04-03 | Discharge: 2017-04-03 | Disposition: A | Discharge status: Home / Self Care | Payer: Medicare Other | Attending: Emergency Medicine | Admitting: Emergency Medicine

## 2017-04-03 DIAGNOSIS — Z7902 Long term (current) use of antithrombotics/antiplatelets: Secondary | ICD-10-CM | POA: Diagnosis not present

## 2017-04-03 DIAGNOSIS — Z7984 Long term (current) use of oral hypoglycemic drugs: Secondary | ICD-10-CM | POA: Insufficient documentation

## 2017-04-03 DIAGNOSIS — M545 Low back pain: Secondary | ICD-10-CM | POA: Diagnosis not present

## 2017-04-03 DIAGNOSIS — Z91040 Latex allergy status: Secondary | ICD-10-CM | POA: Diagnosis not present

## 2017-04-03 DIAGNOSIS — M25552 Pain in left hip: Secondary | ICD-10-CM | POA: Diagnosis not present

## 2017-04-03 DIAGNOSIS — E119 Type 2 diabetes mellitus without complications: Secondary | ICD-10-CM | POA: Diagnosis not present

## 2017-04-03 DIAGNOSIS — I10 Essential (primary) hypertension: Secondary | ICD-10-CM | POA: Diagnosis not present

## 2017-04-03 DIAGNOSIS — Z79899 Other long term (current) drug therapy: Secondary | ICD-10-CM | POA: Diagnosis not present

## 2017-04-03 DIAGNOSIS — Z87891 Personal history of nicotine dependence: Secondary | ICD-10-CM | POA: Diagnosis not present

## 2017-04-03 DIAGNOSIS — R52 Pain, unspecified: Secondary | ICD-10-CM

## 2017-04-03 LAB — CBC WITH DIFFERENTIAL/PLATELET
Basophils Absolute: 0.1 10*3/uL (ref 0–0.1)
Basophils Relative: 1 %
Eosinophils Absolute: 0.3 10*3/uL (ref 0–0.7)
Eosinophils Relative: 3 %
HEMATOCRIT: 45.4 % (ref 35.0–47.0)
HEMOGLOBIN: 15.2 g/dL (ref 12.0–16.0)
LYMPHS ABS: 2.5 10*3/uL (ref 1.0–3.6)
Lymphocytes Relative: 24 %
MCH: 28.4 pg (ref 26.0–34.0)
MCHC: 33.6 g/dL (ref 32.0–36.0)
MCV: 84.5 fL (ref 80.0–100.0)
MONOS PCT: 9 %
Monocytes Absolute: 0.9 10*3/uL (ref 0.2–0.9)
NEUTROS PCT: 63 %
Neutro Abs: 6.7 10*3/uL — ABNORMAL HIGH (ref 1.4–6.5)
Platelets: 236 10*3/uL (ref 150–440)
RBC: 5.37 MIL/uL — AB (ref 3.80–5.20)
RDW: 16.8 % — ABNORMAL HIGH (ref 11.5–14.5)
WBC: 10.5 10*3/uL (ref 3.6–11.0)

## 2017-04-03 LAB — BASIC METABOLIC PANEL
Anion gap: 8 (ref 5–15)
BUN: 21 mg/dL — ABNORMAL HIGH (ref 6–20)
CHLORIDE: 105 mmol/L (ref 101–111)
CO2: 28 mmol/L (ref 22–32)
Calcium: 9.5 mg/dL (ref 8.9–10.3)
Creatinine, Ser: 0.9 mg/dL (ref 0.44–1.00)
GFR calc non Af Amer: >60 mL/min (ref >60)
Glucose, Bld: 128 mg/dL — ABNORMAL HIGH (ref 65–99)
POTASSIUM: 3.7 mmol/L (ref 3.5–5.1)
Sodium: 141 mmol/L (ref 135–145)

## 2017-04-03 MED ORDER — KETOROLAC TROMETHAMINE 30 MG/ML IJ SOLN
30.0000 mg | Freq: Once | INTRAMUSCULAR | Status: AC
  Administered 2017-04-03: 30 mg via INTRAMUSCULAR
  Filled 2017-04-03: qty 1

## 2017-04-03 NOTE — ED Triage Notes (Signed)
Patient to ED via EMS from home with complaints of hip pain. States no injury, just started hurting. Patient just 'started using the walker today it hurt so bad." Patient is jovial and follows commands.

## 2017-04-03 NOTE — Discharge Instructions (Signed)
Take it easy for a couple days. Don't rest in bed but don't put anything really strenuous either. Try the Toradol one pill 4 times a day for the next 2 days. Stop it if it begins to upset your stomach . Follow up with your doctor return if worse.

## 2017-04-03 NOTE — ED Provider Notes (Signed)
[facility] Emergency Department Provider Note   ____________________________________________   First MD Initiated Contact with Patient 04/03/17 0101     (approximate)  I have reviewed the triage vital signs and the nursing notes.   HISTORY  Chief Complaint Hip Pain    HPI Kristin Mora is a 61 y.o. female who reports she was moving in and got pain in her left hip. She tried to lay down but couldn't lay down because it hurt too much. Some movements make her have severe spasms. Pain is in her left hip and buttocks higher up in the buttocks. She has not had this before.   Past Medical History:  Diagnosis Date  . Asthma   . Brain aneurysm 04/2007  . Diabetes mellitus without complication ([place])    controlled;  . HLD (hyperlipidemia)   . Hypertension    controlled with medication  . Stroke ([place])   . Vertigo     Patient Active Problem List   Diagnosis Date Noted  . Axillary hidradenitis suppurativa 06/29/2016  . Stroke ([place]) 09/15/2015  . Vertigo 09/14/2015  . Chronic airway obstruction ([place]) 11/04/2014  . Avitaminosis D 11/04/2014  . Adiposity 11/04/2014  . Hypercholesteremia 11/04/2014  . Hemorrhagic cerebrovascular accident (CVA) ([place]) 11/04/2014  . Diabetes mellitus, type 2 ([place]) 10/01/2014  . Essential (primary) hypertension 10/01/2014  . Thalamic hemorrhage ([place]) 10/01/2014    Past Surgical History:  Procedure Laterality Date  . CRANIOTOMY  04/2007  . KNEE SURGERY    . TONSILLECTOMY AND ADENOIDECTOMY    . TUBAL LIGATION      Prior to Admission medications   Medication Sig Start Date End Date Taking? Authorizing Provider  amLODipine (NORVASC) 5 MG tablet Take 1 tablet (5 mg total) by mouth daily. 05/29/15   Carmon Ginsberg, PA  atorvastatin (LIPITOR) 40 MG tablet Take 1 tablet (40 mg total) by mouth every evening. 09/17/15   Vaughan Basta, MD  clindamycin (CLINDAGEL) 1 % gel Apply to affected area 2 times daily 07/10/16  07/10/17  Lannie Fields, PA-C  clopidogrel (PLAVIX) 75 MG tablet Take 1 tablet (75 mg total) by mouth daily. 09/17/15   Vaughan Basta, MD  diazepam (VALIUM) 5 MG tablet Take 1.5 tablets (7.5 mg total) by mouth every 8 (eight) hours as needed (vertigo). ONLY IF NEEDED. 09/17/15   Vaughan Basta, MD  doxycycline (VIBRA-TABS) 100 MG tablet Take 1 tablet (100 mg total) by mouth 2 (two) times daily. 06/29/16   Carmon Ginsberg, PA  gabapentin (NEURONTIN) 600 MG tablet Take 0.5 tablets (300 mg total) by mouth 2 (two) times daily. 09/17/15   Vaughan Basta, MD  losartan (COZAAR) 100 MG tablet TAKE ONE TABLET BY MOUTH ONCE DAILY 03/02/17   Carmon Ginsberg, PA  meclizine (ANTIVERT) 12.5 MG tablet Take 1 tablet (12.5 mg total) by mouth 3 (three) times daily. 09/17/15   Vaughan Basta, MD  metFORMIN (GLUCOPHAGE) 500 MG tablet Take 1 tablet (500 mg total) by mouth 2 (two) times daily with a meal. 08/14/15   Carmon Ginsberg, PA  metFORMIN (GLUCOPHAGE) 500 MG tablet TAKE ONE TABLET BY MOUTH TWICE DAILY 07/25/16   Carmon Ginsberg, PA  sulfacetamide (BLEPH-10) 10 % ophthalmic solution Place 2 drops into the left eye 4 (four) times daily. 07/08/16   Carmon Ginsberg, PA    Allergies Lisinopril and Latex  Family History  Problem Relation Age of Onset  . Heart attack Mother   . Coronary artery disease Mother   . Lung cancer Father   .  Diabetes Sister   . Stroke Brother   . Uterine cancer Sister   . Lung cancer Sister   . Bone cancer Brother     Social History Social History  Substance Use Topics  . Smoking status: Former Smoker    Quit date: 11/25/2009  . Smokeless tobacco: Never Used  . Alcohol use No    Review of Systems  Constitutional: No fever/chills Eyes: No visual changes. ENT: No sore throat. Cardiovascular: Denies chest pain. Respiratory: Denies shortness of breath. Gastrointestinal: No abdominal pain.  No nausea, no vomiting.  No diarrhea.  No  constipation. Genitourinary: Negative for dysuria. Musculoskeletal: See history of present illness Skin: Negative for rash. Neurological: Negative for headaches, focal weakness   ____________________________________________   PHYSICAL EXAM:  VITAL SIGNS: ED Triage Vitals [04/03/17 0102]  Enc Vitals Group     BP      Pulse      Resp      Temp      Temp src      SpO2      Weight      Height      Head Circumference      Peak Flow      Pain Score 10     Pain Loc      Pain Edu?      Excl. in [place]?     Constitutional: Alert and oriented. Well appearing and in no acute distress.Until she's moved when she has hip pain. Eyes: Conjunctivae are normal.  Head: Atraumatic. Nose: No congestion/rhinnorhea. Mouth/Throat: Mucous membranes are moist.   Neck: No stridor.Cardiovascular:   Good peripheral circulation. Respiratory: Normal respiratory effort.  No retractions.  Gastrointestinal: Soft and nontender. No distention. No abdominal bruits. No CVA tenderness. Musculoskeletal: Mild tenderness on palpation of the hip there is more pain on palpation of about L4 and lateral to that. Neurologic:  Normal speech and language. No gross focal neurologic deficits are appreciated. Straight leg raise is negative sensation in the feet is normal C refill in the feet is normal as well Skin:  Skin is warm, dry and intact. No rash noted. Psychiatric: Mood and affect are normal. Speech and behavior are normal.  ____________________________________________   LABS (all labs ordered are listed, but only abnormal results are displayed)  Labs Reviewed  BASIC METABOLIC PANEL - Abnormal; Notable for the following:       Result Value   Glucose, Bld 128 (*)    BUN 21 (*)    All other components within normal limits  CBC WITH DIFFERENTIAL/PLATELET - Abnormal; Notable for the following:    RBC 5.37 (*)    RDW 16.8 (*)    Neutro Abs 6.7 (*)    All other components within normal limits    ____________________________________________  EKG  EKG read and interpreted by me shows normal sinus rhythm rate of 79 normal axis no acute ST-T wave changes ____________________________________________  RADIOLOGY   ____________________________________________   PROCEDURES  Procedure(s) performed:   Procedures  Critical Care performed:  ____________________________________________   INITIAL IMPRESSION / ASSESSMENT AND PLAN / ED COURSE  As part of my medical decision making, I reviewed the following data within the [place]    Patient much better after Toradol. X-rays are negative. I will give her 2 more days of Toradol by mouth if she needs them. Discussed with her the need to take the Toradol with food and to stop it if her stomach began to get upset or she  notices any black tarry stools. Told her to return if any of that happens as well.      ____________________________________________   FINAL CLINICAL IMPRESSION(S) / ED DIAGNOSES  Final diagnoses:  Left hip pain      NEW MEDICATIONS STARTED DURING THIS VISIT:  New Prescriptions   No medications on file     Note:  This document was prepared using Dragon voice recognition software and may include unintentional dictation errors.    Nena Polio, MD 04/03/17 [phone]

## 2017-04-04 ENCOUNTER — Other Ambulatory Visit: Payer: Self-pay | Admitting: Family Medicine

## 2017-04-04 ENCOUNTER — Telehealth: Payer: Self-pay | Admitting: Family Medicine

## 2017-04-04 ENCOUNTER — Ambulatory Visit: Payer: Self-pay | Admitting: Family Medicine

## 2017-04-04 ENCOUNTER — Encounter: Payer: Self-pay | Admitting: Family Medicine

## 2017-04-04 ENCOUNTER — Ambulatory Visit (INDEPENDENT_AMBULATORY_CARE_PROVIDER_SITE_OTHER): Payer: Medicare Other | Admitting: Family Medicine

## 2017-04-04 VITALS — BP 154/90 | HR 72 | Temp 97.8°F | Resp 16 | Wt 280.0 lb

## 2017-04-04 DIAGNOSIS — E119 Type 2 diabetes mellitus without complications: Secondary | ICD-10-CM

## 2017-04-04 DIAGNOSIS — M461 Sacroiliitis, not elsewhere classified: Secondary | ICD-10-CM

## 2017-04-04 LAB — POCT GLYCOSYLATED HEMOGLOBIN (HGB A1C): Hemoglobin A1C: 6.6

## 2017-04-04 MED ORDER — METFORMIN HCL 500 MG PO TABS: 500.0000 mg | ORAL_TABLET | Freq: Two times a day (BID) | ORAL | 1 refills | Status: DC

## 2017-04-04 MED ORDER — CYCLOBENZAPRINE HCL 5 MG PO TABS: 5.0000 mg | ORAL_TABLET | Freq: Three times a day (TID) | ORAL | 0 refills | Status: DC | PRN

## 2017-04-04 MED ORDER — PREDNISONE 20 MG PO TABS: ORAL_TABLET | ORAL | 0 refills | Status: DC

## 2017-04-04 NOTE — Telephone Encounter (Signed)
[id number] 

## 2017-04-04 NOTE — Telephone Encounter (Signed)
Pt is wanting to speak to someone about her mom's prescriptions.  States she was not able to come back during visit and knows her mom is supposed to be taking more than one medication.  She would like to speak to nurse or provider

## 2017-04-04 NOTE — Progress Notes (Signed)
Subjective:     Patient ID: Kristin Mora, female   DOB: 1956-01-05, 61 y.o.   MRN: [id number]  HPI  Chief Complaint  Patient presents with  . Hip Pain    Patient comes into office today with complaints of left hip pain x 1 week, patient described pain as a pinching feeling and states that pain shoots across her lower back. Patient reports on Sunday she called EMS to home because she was unable to move she was later transported and states that x-rays were normal and injection was administered for pinched nerve.  . Diabetes    Patient returns to office today for follow up, last office visit was 06/29/16 and HgbA1c was 6.5%. Patient is checking blood sugar at home and denies any hypoglycemia incidents, she reports poor compliance on medication  . Hypertension    Patient returns for follow up from 06/29/16 blood pressure at last visit was 150/96  States she helped lift her sister prior to her symptoms worsening on 04/02/17.No prior hx of back strain or surgery. Depends on her daughter to keep up with her medications and is not sure what she is taking. She is unable to get up on the exam table today due to pain. Utilizes a walker for ambulation.   Review of Systems     Objective:   Physical Exam  Constitutional: She appears well-developed and well-nourished. She appears distressed (moderate pain with ambulation).  Musculoskeletal:  Significantly tender over her Left SI area. Unable to perform muscle testing due to pain       Assessment:    1. Type 2 diabetes mellitus without complication, without long-term current use of insulin ([place]): controlled  - metFORMIN (GLUCOPHAGE) 500 MG tablet; Take 1 tablet (500 mg total) by mouth 2 (two) times daily. With meal.  Dispense: 180 tablet; Refill: 1 - POCT glycosylated hemoglobin (Hb A1C)  2. Sacroiliitis ([facility]) - cyclobenzaprine (FLEXERIL) 5 MG tablet; Take 1 tablet (5 mg total) by mouth 3 (three) times daily as needed for muscle spasms.  Dispense: 21  tablet; Refill: 0 - predniSONE (DELTASONE) 20 MG tablet; Taper as follows: 3 pills for 4 days, two pills for 4 days, one pill for four days  Dispense: 24 tablet; Refill: 0    Plan:    f/u in w weeks for bp and for back if not improving. She is to bring her medications with her for reconciliation.

## 2017-04-04 NOTE — Patient Instructions (Signed)
Please bring the medications you are taking with you at your next office visit.

## 2017-04-11 NOTE — Telephone Encounter (Signed)
Called was no answer then went to busy signal, try again [employer], RMA

## 2017-04-14 NOTE — Telephone Encounter (Signed)
Spoke with daughter on phone and she had concerns about patient being on Metformin, daughter states that previously patient was on Metformin and developed swelling in her feet and ankles, daughter states that you were had told patient to stop Metformin to see if swelling would improve and it did. Patient states that swelling never quite went down even after stopping metformin. Daugter states since office visit patient has been taking Metformin as prescribed but swelling has returned in her lower extremities. Daughter ask if you would like for patient to continue Metformin or if swelling could be due to patients blood pressure medication? Patients daughter advised that you are out of office and a return call would be made Monday morning, daughter understood. KW

## 2017-04-17 NOTE — Telephone Encounter (Signed)
Swelling more likely from amlodipine which is a common side effect. If swelling is not tolerable-set up a return office visit to check swelling and possibly change medication

## 2017-04-18 ENCOUNTER — Encounter: Payer: Self-pay | Admitting: Family Medicine

## 2017-04-18 ENCOUNTER — Ambulatory Visit (INDEPENDENT_AMBULATORY_CARE_PROVIDER_SITE_OTHER): Payer: Medicare Other | Admitting: Family Medicine

## 2017-04-18 VITALS — BP 128/88 | HR 77 | Temp 97.9°F | Resp 16 | Wt 312.2 lb

## 2017-04-18 DIAGNOSIS — E7800 Pure hypercholesterolemia, unspecified: Secondary | ICD-10-CM

## 2017-04-18 DIAGNOSIS — I10 Essential (primary) hypertension: Secondary | ICD-10-CM | POA: Diagnosis not present

## 2017-04-18 MED ORDER — ATORVASTATIN CALCIUM 40 MG PO TABS: 40.0000 mg | ORAL_TABLET | Freq: Every evening | ORAL | 3 refills | Status: DC

## 2017-04-18 NOTE — Patient Instructions (Signed)
Continue current blood pressure medication. Make sure you are taking the cholesterol medication, atorvastatin, as well. Get a flu shot after you finish the prednisone.

## 2017-04-18 NOTE — Telephone Encounter (Signed)
[id number] 

## 2017-04-18 NOTE — Telephone Encounter (Signed)
Patient seen in office and advised. KW

## 2017-04-18 NOTE — Progress Notes (Signed)
Subjective:     Patient ID: Kristin Mora, female   DOB: 12-Sep-1955, 61 y.o.   MRN: [id number]  HPI  Chief Complaint  Patient presents with  . Back Pain    Patient returns back to office today for sacroiliitis, patient was last seen on 04/04/17 and was started on Flexeril 5mg  and Prednisone 20mg . Patient reports good compliance and tolerance on mediations and states that symptoms with back pain have improved dramatically.   . Hypertension    Patient returns for follow up from 04/04/17, blood pressure at last visit was 154/90. Patient reports good compliance on Amlodipine.   . Edema    For several weeks patient complains of swelling in her ankles and feet.   Has two more days of prednisone to complete. Suggested leg swelling due to prednisone, inactivity due to back pain and possibly amlodipine.   Review of Systems     Objective:   Physical Exam  Constitutional: She appears well-developed and well-nourished. No distress (moving well today without a cane).  Cardiovascular: Normal rate and regular rhythm.   Pulmonary/Chest: Breath sounds normal.  Musculoskeletal: She exhibits no edema (of lower extremities).       Assessment:    1. Essential (primary) hypertension: stable    Plan:    Continue current medication including cholesterol medication. Get a flu shot once you are off prednisone.

## 2017-05-25 ENCOUNTER — Encounter: Payer: Self-pay | Admitting: Podiatry

## 2017-05-25 ENCOUNTER — Other Ambulatory Visit: Payer: Self-pay | Admitting: Podiatry

## 2017-05-25 ENCOUNTER — Ambulatory Visit (INDEPENDENT_AMBULATORY_CARE_PROVIDER_SITE_OTHER): Payer: Medicare Other | Admitting: Podiatry

## 2017-05-25 ENCOUNTER — Ambulatory Visit (INDEPENDENT_AMBULATORY_CARE_PROVIDER_SITE_OTHER): Payer: Medicare Other

## 2017-05-25 DIAGNOSIS — D689 Coagulation defect, unspecified: Secondary | ICD-10-CM | POA: Diagnosis not present

## 2017-05-25 DIAGNOSIS — R52 Pain, unspecified: Secondary | ICD-10-CM

## 2017-05-25 DIAGNOSIS — M722 Plantar fascial fibromatosis: Secondary | ICD-10-CM | POA: Diagnosis not present

## 2017-05-25 NOTE — Progress Notes (Signed)
   Subjective:    Patient ID: Kristin Mora, female    DOB: Nov 13, 1955, 61 y.o.   MRN: [id number]  HPIThis patient presents the office with chief complaint of a painful arch on her left foot.  She says that the pain has been present for approximately 3 weeks.  She says this area becomes painful as she walks and that the pain extends into her left heel.  She says she has provided no self treatment nor sought any professional help.  Patient denies any trauma or injury to her left foot.  She presents the office today for an evaluation and treatment of her left foot. Patient is on Plavix.    Review of Systems  All other systems reviewed and are negative.      Objective:   Physical Exam General Appearance  Alert, conversant and in no acute stress.  Vascular  Dorsalis pedis and posterior pulses are palpable  bilaterally.  Capillary return is within normal limits  Bilaterally. Temperature is within normal limits  Bilaterally  Neurologic  Senn-Weinstein monofilament wire test within normal limits  bilaterally. Muscle power  Within normal limits bilaterally.  Nails Normotropic nails both feet with no evidence of fungus or infection.  Orthopedic  No limitations of motion of motion feet bilaterally.  No crepitus or effusions noted.  No bony pathology or digital deformities noted. Palpable pain noted through the plantar fascia on the bottom of her left foot.  Skin  normotropic skin with no porokeratosis noted bilaterally.  No signs of infections or ulcers noted.          Assessment & Plan:  Plantar fascitis left foot.  IE  X-rays reveal significant bony changes at the insertion of the Achilles tendon left foot.  There is also calcification noted on the outside of the cuboid bone, left foot.  There is joint narrowing noted at the first MPJ of the left foot. Diagnosis. Patient is having plantar fasciitis of the left foot.  Discussed this condition with this patient and recommended power step  insoles to be worn in her shoes.  Patient states that she does not have any money to purchase the power step insoles today.  She will return and purchase power step insoles in the future. I did not prescribe an anti-inflammatory for this patient since she is taking Plavix as well as already taking prednisone by mouth.  RTC 4 weeks.   Gardiner Barefoot DPM

## 2017-06-22 ENCOUNTER — Ambulatory Visit: Payer: Medicare Other | Admitting: Podiatry

## 2017-06-23 ENCOUNTER — Telehealth: Payer: Self-pay | Admitting: Family Medicine

## 2017-06-23 ENCOUNTER — Ambulatory Visit (INDEPENDENT_AMBULATORY_CARE_PROVIDER_SITE_OTHER): Payer: Medicare Other | Admitting: Family Medicine

## 2017-06-23 ENCOUNTER — Other Ambulatory Visit: Payer: Self-pay | Admitting: Family Medicine

## 2017-06-23 VITALS — BP 172/94 | HR 74 | Temp 98.2°F | Resp 16 | Wt 321.0 lb

## 2017-06-23 DIAGNOSIS — R0683 Snoring: Secondary | ICD-10-CM

## 2017-06-23 DIAGNOSIS — I10 Essential (primary) hypertension: Secondary | ICD-10-CM

## 2017-06-23 MED ORDER — AMLODIPINE BESYLATE 5 MG PO TABS: 5.0000 mg | ORAL_TABLET | Freq: Every day | ORAL | 1 refills | Status: DC

## 2017-06-23 MED ORDER — CLOPIDOGREL BISULFATE 75 MG PO TABS: 75.0000 mg | ORAL_TABLET | Freq: Every day | ORAL | 1 refills | Status: DC

## 2017-06-23 MED ORDER — HYDROCHLOROTHIAZIDE 25 MG PO TABS: 25.0000 mg | ORAL_TABLET | Freq: Every day | ORAL | 0 refills | Status: DC

## 2017-06-23 NOTE — Telephone Encounter (Signed)
Pt requesting a RX of clopidogrel sent to [facility] [address].  States she is supposed to be taking but pharmacy doesn't have on file.

## 2017-06-23 NOTE — Progress Notes (Signed)
Subjective:     Patient ID: Kristin Mora, female   DOB: 1956-01-16, 61 y.o.   MRN: [id number] Chief Complaint  Patient presents with  . Foot Swelling    Patient is here to discuss left foot swelling. Per patient states Kristin Mora has been an issue since last office visit on 04/23/17. This is not better. patient also saw Dr Augustin Coupe since then and was told she has heel spurs. No injury or trauma to the foot that patient recalls.  . Fatigue    Patient's daughter states she thinks that patient has sleep apnea. Per daughter patient snores at night time, suspects apne episodes and patient dozes off to sleep all day off and on.   HPI States she is compliant with blood pressure medication. Reports her CVA affected her left side which may explain mild swelling at times. Daughter accompanies her and helps fill out the [place]worth screen-score 14. Patient states she does snore at night.  Review of Systems     Objective:   Physical Exam  Constitutional: She appears well-developed and well-nourished. No distress.  Cardiovascular: Normal rate and regular rhythm.  Pulmonary/Chest: Breath sounds normal.  Musculoskeletal: She exhibits no edema (of lower extremities).       Assessment:    1. Essential (primary) hypertension: add medication for improved control - hydrochlorothiazide (HYDRODIURIL) 25 MG tablet; Take 1 tablet (25 mg total) by mouth daily.  Dispense: 30 tablet; Refill: 0  2. Snoring - Ambulatory referral to Sleep Studies    Plan:    F/u of DM and HTN in 2 weeks.

## 2017-06-23 NOTE — Patient Instructions (Signed)
We will call you about the sleep study referral

## 2017-06-23 NOTE — Telephone Encounter (Signed)
done

## 2017-06-23 NOTE — Telephone Encounter (Signed)
Please Review.  Thanks,  -Joseline 

## 2017-07-07 ENCOUNTER — Ambulatory Visit: Payer: Medicare Other | Admitting: Family Medicine

## 2017-07-14 ENCOUNTER — Ambulatory Visit: Payer: Medicare Other | Admitting: Family Medicine

## 2017-07-17 ENCOUNTER — Encounter: Payer: Self-pay | Admitting: Family Medicine

## 2017-07-17 ENCOUNTER — Ambulatory Visit (INDEPENDENT_AMBULATORY_CARE_PROVIDER_SITE_OTHER): Payer: Medicare Other | Admitting: Family Medicine

## 2017-07-17 ENCOUNTER — Ambulatory Visit: Payer: Medicare Other | Admitting: Family Medicine

## 2017-07-17 VITALS — BP 136/82 | HR 74 | Temp 98.6°F | Resp 16 | Wt 310.0 lb

## 2017-07-17 DIAGNOSIS — E119 Type 2 diabetes mellitus without complications: Secondary | ICD-10-CM

## 2017-07-17 DIAGNOSIS — J069 Acute upper respiratory infection, unspecified: Secondary | ICD-10-CM | POA: Diagnosis not present

## 2017-07-17 DIAGNOSIS — I10 Essential (primary) hypertension: Secondary | ICD-10-CM | POA: Diagnosis not present

## 2017-07-17 LAB — POCT GLYCOSYLATED HEMOGLOBIN (HGB A1C): HEMOGLOBIN A1C: 7

## 2017-07-17 MED ORDER — HYDROCODONE-HOMATROPINE 5-1.5 MG/5ML PO SYRP: ORAL_SOLUTION | ORAL | 0 refills | Status: DC

## 2017-07-17 MED ORDER — HYDROCHLOROTHIAZIDE 25 MG PO TABS: 25.0000 mg | ORAL_TABLET | Freq: Every day | ORAL | 3 refills | Status: DC

## 2017-07-17 NOTE — Patient Instructions (Signed)
Do follow for the sleep study this week.

## 2017-07-17 NOTE — Progress Notes (Signed)
Subjective:     Patient ID: Kristin Mora, female   DOB: Nov 01, 1955, 62 y.o.   MRN: [id number] Chief Complaint  Patient presents with  . Cough    Patient comes into office today with cocnern of cough for the past 4 days, patient states that cough has been dry and she has been wheezing. Patient has tried otc [place] Hbp.  . Hypertension    Patient returns to ofice today for follow up, patient was last seen 06/23/17 and was started on HCTZ 25mg  qd for improved control. Patient blood pressure at last visit was 172/94, patient reports good compliance and tolerance on medication  . Diabetes    Patient returns for follow up visit, last office visit was 04/04/17 patients HgbA1C in house was 6.6%. Patient reports good compliance on medication, she states she has noticed more frequency of urination on medication.    HPI Reports cold sx onset about 5 days ago. Feels like it is improving but continues to have cough disrupting her sleep. She is pending a sleep study this week for possible OSA.  Review of Systems     Objective:   Physical Exam  Constitutional: She appears well-developed and well-nourished. No distress.  Ears: T.M's intact without inflammation Sinuses: non-tender Throat: tonsils absent Neck: no cervical adenopathy Lungs: clear CV: RRR, pedal pulses intact, no pedal edema Neuro: sensation to monofilament intact in distal lower extremities.      Assessment:    1. Type 2 diabetes mellitus without complication, without long-term current use of insulin ([place]): stable - POCT glycosylated hemoglobin (Hb A1C)  2. Essential (primary) hypertension: controlled - hydrochlorothiazide (HYDRODIURIL) 25 MG tablet; Take 1 tablet (25 mg total) by mouth daily.  Dispense: 90 tablet; Refill: 3  3. Viral upper respiratory tract infection - HYDROcodone-homatropine (HYCODAN) 5-1.5 MG/5ML syrup; 5 ml 4-6 hours as needed for cough  Dispense: 120 mL; Refill: 0    Plan:   Proceed with sleep study

## 2017-07-19 ENCOUNTER — Ambulatory Visit: Payer: Self-pay | Admitting: Family Medicine

## 2017-07-21 ENCOUNTER — Ambulatory Visit: Payer: Medicare Other | Attending: Otolaryngology

## 2017-07-21 DIAGNOSIS — G4733 Obstructive sleep apnea (adult) (pediatric): Secondary | ICD-10-CM | POA: Insufficient documentation

## 2017-07-21 DIAGNOSIS — Z8673 Personal history of transient ischemic attack (TIA), and cerebral infarction without residual deficits: Secondary | ICD-10-CM | POA: Diagnosis not present

## 2017-07-21 DIAGNOSIS — I10 Essential (primary) hypertension: Secondary | ICD-10-CM | POA: Insufficient documentation

## 2017-07-27 ENCOUNTER — Other Ambulatory Visit: Payer: Self-pay | Admitting: Family Medicine

## 2017-07-27 DIAGNOSIS — G4733 Obstructive sleep apnea (adult) (pediatric): Secondary | ICD-10-CM

## 2017-08-04 ENCOUNTER — Ambulatory Visit: Payer: Medicare Other | Attending: Neurology

## 2017-08-04 DIAGNOSIS — G4733 Obstructive sleep apnea (adult) (pediatric): Secondary | ICD-10-CM | POA: Insufficient documentation

## 2017-08-21 ENCOUNTER — Telehealth: Payer: Self-pay | Admitting: Family Medicine

## 2017-08-21 NOTE — Telephone Encounter (Signed)
Pt states she had her sleep study test done and they told her she needed a CPAP machine.  States she isn't sure how to get one or where to get one.  States she would like a call back.

## 2017-08-21 NOTE — Telephone Encounter (Signed)
I believe we sent in the request to [facility] for the bipap. Could you check on this. Thanks.

## 2017-08-21 NOTE — Telephone Encounter (Signed)
Please advise, Im not sure if this is an order we put in. [place]

## 2017-08-22 NOTE — Telephone Encounter (Signed)
I LMTCB at [facility] at [phone]

## 2017-08-25 NOTE — Telephone Encounter (Signed)
FYI-see below- 

## 2017-08-25 NOTE — Telephone Encounter (Signed)
Per [facility] pt has an appointment 09/05/17 to set up bipap

## 2017-10-16 ENCOUNTER — Ambulatory Visit: Payer: Medicare Other | Admitting: Family Medicine

## 2017-10-16 ENCOUNTER — Other Ambulatory Visit: Payer: Self-pay | Admitting: Family Medicine

## 2017-10-20 ENCOUNTER — Ambulatory Visit (INDEPENDENT_AMBULATORY_CARE_PROVIDER_SITE_OTHER): Payer: Medicare Other | Admitting: Family Medicine

## 2017-10-20 ENCOUNTER — Encounter: Payer: Self-pay | Admitting: Family Medicine

## 2017-10-20 VITALS — BP 142/100 | HR 87 | Temp 98.1°F | Resp 18 | Wt 309.2 lb

## 2017-10-20 DIAGNOSIS — I10 Essential (primary) hypertension: Secondary | ICD-10-CM

## 2017-10-20 DIAGNOSIS — Z1239 Encounter for other screening for malignant neoplasm of breast: Secondary | ICD-10-CM | POA: Insufficient documentation

## 2017-10-20 DIAGNOSIS — G4733 Obstructive sleep apnea (adult) (pediatric): Secondary | ICD-10-CM

## 2017-10-20 DIAGNOSIS — Z1231 Encounter for screening mammogram for malignant neoplasm of breast: Secondary | ICD-10-CM | POA: Diagnosis not present

## 2017-10-20 DIAGNOSIS — E119 Type 2 diabetes mellitus without complications: Secondary | ICD-10-CM | POA: Diagnosis not present

## 2017-10-20 DIAGNOSIS — E7800 Pure hypercholesterolemia, unspecified: Secondary | ICD-10-CM | POA: Diagnosis not present

## 2017-10-20 LAB — POCT GLYCOSYLATED HEMOGLOBIN (HGB A1C): HEMOGLOBIN A1C: 7

## 2017-10-20 NOTE — Progress Notes (Signed)
Subjective:     Patient ID: Kristin Mora, female   DOB: [birth year], 62 y.o.   MRN: [id number] Chief Complaint  Patient presents with  . Diabetes    Patient returns today for 3 month follow up from 07/17/17. At last visit HgbA1C was 7.0%, patient reports that blood sugar readings outside the office have been normal. Patient denies increased thirst or urination but states that she has had visual changes in her right eye and sometimes sees spots and lines. Patient denies any changes to her feet such as wounds or sores, patient reports good compliance and tolerance on medcation.  . Hypertension    Patient returns to office today for 3 month follow up from 07/17/17. Patient reports good compliance and tolerance on HCTZ.    HPI States she did not take her bp medication this AM. Agreeable about updating pap and mammogram but prefers female provider. Has not started BiPap; "I thought my grandchildren would be afraid of the mask."  Review of Systems  Respiratory: Negative for shortness of breath.   Cardiovascular: Negative for chest pain and palpitations.       Objective:   Physical Exam  Constitutional: She appears well-developed and well-nourished. No distress.  Cardiovascular: Normal rate and regular rhythm.  Pulmonary/Chest: Breath sounds normal.  Musculoskeletal: She exhibits no edema (of lower extemities).       Assessment:    1. Type 2 diabetes mellitus without complication, without long-term current use of insulin ([place]): stable - POCT glycosylated hemoglobin (Hb A1C)  2. Essential (primary) hypertension - Comprehensive metabolic panel  3. OSA (obstructive sleep apnea); start BiPap  4. Screening for breast cancer - MM DIGITAL SCREENING BILATERAL; Future  5. Hypercholesteremia - Lipid panel    Plan:    Start Bipap; schedule mammogram and pap smear with female provider here. Call your pharmacist about losartan recall. We will call you about the lab results.

## 2017-10-20 NOTE — Patient Instructions (Addendum)
We will call you about the lab results. Please call the breast center and set up an appointment. Also set up an appointment with one of our female providers for a pap smear. Ask your pharmacist about your Losartan if it was one of the recalled lots. Start using the BiPap at night.

## 2017-10-21 ENCOUNTER — Other Ambulatory Visit: Payer: Self-pay | Admitting: Family Medicine

## 2017-10-21 LAB — COMPREHENSIVE METABOLIC PANEL
ALBUMIN: 3.8 g/dL (ref 3.6–4.8)
ALT: 43 IU/L — ABNORMAL HIGH (ref 0–32)
AST: 48 IU/L — ABNORMAL HIGH (ref 0–40)
Albumin/Globulin Ratio: 1.1 — ABNORMAL LOW (ref 1.2–2.2)
Alkaline Phosphatase: 77 IU/L (ref 39–117)
BUN / CREAT RATIO: 19 (ref 12–28)
BUN: 14 mg/dL (ref 8–27)
Bilirubin Total: 0.7 mg/dL (ref 0.0–1.2)
CO2: 26 mmol/L (ref 20–29)
CREATININE: 0.72 mg/dL (ref 0.57–1.00)
Calcium: 9.3 mg/dL (ref 8.7–10.3)
Chloride: 103 mmol/L (ref 96–106)
GFR, EST AFRICAN AMERICAN: 102 mL/min/{1.73_m2} (ref >59)
GFR, EST NON AFRICAN AMERICAN: 89 mL/min/{1.73_m2} (ref >59)
GLUCOSE: 107 mg/dL — AB (ref 65–99)
Globulin, Total: 3.4 g/dL (ref 1.5–4.5)
Potassium: 4 mmol/L (ref 3.5–5.2)
Sodium: 143 mmol/L (ref 134–144)
TOTAL PROTEIN: 7.2 g/dL (ref 6.0–8.5)

## 2017-10-21 LAB — LIPID PANEL
Chol/HDL Ratio: 4.4 ratio (ref 0.0–4.4)
Cholesterol, Total: 167 mg/dL (ref 100–199)
HDL: 38 mg/dL — ABNORMAL LOW (ref >39)
LDL Calculated: 111 mg/dL — ABNORMAL HIGH (ref 0–99)
Triglycerides: 92 mg/dL (ref 0–149)
VLDL Cholesterol Cal: 18 mg/dL (ref 5–40)

## 2017-10-21 MED ORDER — ATORVASTATIN CALCIUM 80 MG PO TABS: 80.0000 mg | ORAL_TABLET | Freq: Every day | ORAL | 3 refills | Status: DC

## 2017-10-23 ENCOUNTER — Telehealth: Payer: Self-pay

## 2017-10-23 NOTE — Telephone Encounter (Signed)
Pt returned call

## 2017-10-23 NOTE — Telephone Encounter (Signed)
Pt advised.   Thanks,   -Laura  

## 2017-10-23 NOTE — Telephone Encounter (Signed)
Tried calling; number is busy.   Thanks,   -Mickel Baas

## 2017-10-23 NOTE — Telephone Encounter (Signed)
-----   Message from Carmon Ginsberg, [place] sent at 10/21/2017  8:46 AM EDT ----- Cholesterol is not quite at goal of LDL < 100. Will sent in increased dose of atorvastatin to 80 mg.

## 2017-11-30 ENCOUNTER — Telehealth: Payer: Self-pay | Admitting: Family Medicine

## 2017-11-30 NOTE — Telephone Encounter (Signed)
Daughter called and is asking a nurse to call back and go over all the medications that her mom is supposed to be taking.   She does not think her mom is taking any medications right now.  Karen's call back is [phone]  Thanks Con Memos

## 2017-11-30 NOTE — Telephone Encounter (Signed)
[id number] 

## 2017-12-05 NOTE — Telephone Encounter (Signed)
[id number] 

## 2018-01-19 ENCOUNTER — Ambulatory Visit: Payer: Self-pay | Admitting: Family Medicine

## 2018-06-22 ENCOUNTER — Other Ambulatory Visit: Payer: Self-pay

## 2018-06-22 ENCOUNTER — Encounter: Payer: Self-pay | Admitting: Family Medicine

## 2018-06-22 ENCOUNTER — Ambulatory Visit (INDEPENDENT_AMBULATORY_CARE_PROVIDER_SITE_OTHER): Payer: Medicare Other | Admitting: Family Medicine

## 2018-06-22 VITALS — BP 178/102 | HR 73 | Temp 98.1°F | Ht 62.0 in | Wt 312.2 lb

## 2018-06-22 DIAGNOSIS — G4733 Obstructive sleep apnea (adult) (pediatric): Secondary | ICD-10-CM | POA: Diagnosis not present

## 2018-06-22 DIAGNOSIS — I10 Essential (primary) hypertension: Secondary | ICD-10-CM

## 2018-06-22 DIAGNOSIS — E119 Type 2 diabetes mellitus without complications: Secondary | ICD-10-CM | POA: Diagnosis not present

## 2018-06-22 LAB — POCT GLYCOSYLATED HEMOGLOBIN (HGB A1C): Hemoglobin A1C: 6.4 % — AB (ref 4.0–5.6)

## 2018-06-22 MED ORDER — CLOPIDOGREL BISULFATE 75 MG PO TABS: 75.0000 mg | ORAL_TABLET | Freq: Every day | ORAL | 3 refills | Status: DC

## 2018-06-22 MED ORDER — ATORVASTATIN CALCIUM 80 MG PO TABS: 80.0000 mg | ORAL_TABLET | Freq: Every day | ORAL | 3 refills | Status: DC

## 2018-06-22 MED ORDER — HYDROCHLOROTHIAZIDE 25 MG PO TABS: 25.0000 mg | ORAL_TABLET | Freq: Every day | ORAL | 3 refills | Status: DC

## 2018-06-22 MED ORDER — LOSARTAN POTASSIUM 100 MG PO TABS: 100.0000 mg | ORAL_TABLET | Freq: Every day | ORAL | 3 refills | Status: DC

## 2018-06-22 MED ORDER — AMLODIPINE BESYLATE 5 MG PO TABS: 5.0000 mg | ORAL_TABLET | Freq: Every day | ORAL | 1 refills | Status: DC

## 2018-06-22 MED ORDER — METFORMIN HCL 500 MG PO TABS: 500.0000 mg | ORAL_TABLET | Freq: Two times a day (BID) | ORAL | 1 refills | Status: DC

## 2018-06-22 NOTE — Progress Notes (Signed)
  Subjective:     Patient ID: Kristin Mora, female   DOB: 1956/01/17, 62 y.o.   MRN: [id number] Chief Complaint  Patient presents with  . Follow-up    medications   HPI She is accompanied by her daughter, Kristin Mora, who states she has not been taking her medication.[name] confirms that she just doesn't like to take medication or use the BiPap ("I have been feeling better without it").  Review of Systems  Respiratory: Negative for shortness of breath.   Cardiovascular: Negative for chest pain and palpitations.  Psychiatric/Behavioral:       Denies depression       Objective:   Physical Exam Constitutional:      General: She is not in acute distress.    Appearance: She is not ill-appearing.  Neurological:     Mental Status: She is alert.   Lungs: clear Heart: RRR without murmur Lower extremities: no edema; pedal pulses intact, sensation to monofilament intact, no wounds noted.     Assessment:    1. Type 2 diabetes mellitus without complication, without long-term current use of insulin ([place]): stable - POCT glycosylated hemoglobin (Hb A1C) - metFORMIN (GLUCOPHAGE) 500 MG tablet; Take 1 tablet (500 mg total) by mouth 2 (two) times daily. With meal.  Dispense: 180 tablet; Refill: 1  2. Essential (primary) hypertension - losartan (COZAAR) 100 MG tablet; Take 1 tablet (100 mg total) by mouth daily.  Dispense: 90 tablet; Refill: 3 - hydrochlorothiazide (HYDRODIURIL) 25 MG tablet; Take 1 tablet (25 mg total) by mouth daily.  Dispense: 90 tablet; Refill: 3  3. OSA (obstructive sleep apnea)  4. Essential hypertension - amLODipine (NORVASC) 5 MG tablet; Take 1 tablet (5 mg total) by mouth daily.  Dispense: 90 tablet; Refill: 1    Plan:    She is encouraged to schedule with female provider per her preference for pap/breast exam. Holding off on referral for colonoscopy until bp under control. She agrees to return in 4 weeks for recheck of bp.

## 2018-06-22 NOTE — Patient Instructions (Signed)
Discussed use of Tylenol up to 3000 mg/day. Come back in 4 weeks to recheck blod pressure. Consider making an appointment with a female provider or a gyn for a pap smear and breast exam.

## 2018-06-22 NOTE — Progress Notes (Deleted)
178 

## 2018-06-28 ENCOUNTER — Ambulatory Visit: Payer: Self-pay | Admitting: Family Medicine

## 2018-07-20 ENCOUNTER — Ambulatory Visit: Payer: Self-pay | Admitting: Family Medicine

## 2018-07-25 ENCOUNTER — Ambulatory Visit: Payer: Medicare Other | Admitting: Family Medicine

## 2019-03-12 ENCOUNTER — Other Ambulatory Visit: Payer: Self-pay | Admitting: Family Medicine

## 2019-03-12 ENCOUNTER — Ambulatory Visit (INDEPENDENT_AMBULATORY_CARE_PROVIDER_SITE_OTHER): Payer: Medicare Other | Admitting: Family Medicine

## 2019-03-12 ENCOUNTER — Other Ambulatory Visit: Payer: Self-pay

## 2019-03-12 ENCOUNTER — Encounter: Payer: Self-pay | Admitting: Family Medicine

## 2019-03-12 DIAGNOSIS — I10 Essential (primary) hypertension: Secondary | ICD-10-CM

## 2019-03-12 DIAGNOSIS — E119 Type 2 diabetes mellitus without complications: Secondary | ICD-10-CM

## 2019-03-12 DIAGNOSIS — H1031 Unspecified acute conjunctivitis, right eye: Secondary | ICD-10-CM | POA: Diagnosis not present

## 2019-03-12 MED ORDER — SULFACETAMIDE SODIUM 10 % OP SOLN: 2.0000 [drp] | Freq: Four times a day (QID) | OPHTHALMIC | 0 refills | Status: DC

## 2019-03-12 NOTE — Progress Notes (Signed)
Kristin Mora  MRN: [id number] DOB: 05-Oct-1955 Virtual Visit via Telephone Note  I connected with Kristin Mora on 03/12/19 at  1:20 PM EDT by telephone and verified that I am speaking with the correct person using two identifiers.  Location: Patient: Home Provider: Home   I discussed the limitations, risks, security and privacy concerns of performing an evaluation and management service by telephone and the availability of in person appointments. I also discussed with the patient that there may be a patient responsible charge related to this service. The patient expressed understanding and agreed to proceed.  Subjective:  HPI   The patient is a 63 year old female who is a former patient of Mariel Sleet, [place].  She is changing providers due to [place] retiring.  It is of noted that she has not been seen for her diabetes since 10/20/17.  The patient is being evaluated today via phone interview.  She is complaining of her right eye being red and swollen.    Patient Active Problem List   Diagnosis Date Noted  . Screening for breast cancer 10/20/2017  . OSA (obstructive sleep apnea) 07/27/2017  . Axillary hidradenitis suppurativa 06/29/2016  . Vertigo 09/14/2015  . Chronic airway obstruction ([place]) 11/04/2014  . Avitaminosis D 11/04/2014  . Adiposity 11/04/2014  . Hypercholesteremia 11/04/2014  . Hemorrhagic cerebrovascular accident (CVA) ([place] 11/04/2014  . Diabetes mellitus, type 2 ([place]) 10/01/2014  . Essential (primary) hypertension 10/01/2014  . Thalamic hemorrhage ([place]) 10/01/2014    Past Medical History:  Diagnosis Date  . Asthma   . Brain aneurysm 04/2007  . Diabetes mellitus without complication ([place])    controlled;  . HLD (hyperlipidemia)   . Hypertension    controlled with medication  . Stroke ([place])   . Vertigo     Social History   Socioeconomic History  . Marital status: Legally Separated    Spouse name: Not on file  . Number of children: Not on file  .  Years of education: Not on file  . Highest education level: Not on file  Occupational History  . Occupation: disabled  Social Needs  . Financial resource strain: Not on file  . Food insecurity    Worry: Not on file    Inability: Not on file  . Transportation needs    Medical: Not on file    Non-medical: Not on file  Tobacco Use  . Smoking status: Former Smoker    Quit date: 11/25/2009    Years since quitting: 9.2  . Smokeless tobacco: Never Used  Substance and Sexual Activity  . Alcohol use: No    Alcohol/week: 0.0 standard drinks  . Drug use: No  . Sexual activity: Not on file  Lifestyle  . Physical activity    Days per week: Not on file    Minutes per session: Not on file  . Stress: Not on file  Relationships  . Social Herbalist on phone: Not on file    Gets together: Not on file    Attends religious service: Not on file    Active member of club or organization: Not on file    Attends meetings of clubs or organizations: Not on file    Relationship status: Not on file  . Intimate partner violence    Fear of current or ex partner: Not on file    Emotionally abused: Not on file    Physically abused: Not on file    Forced sexual activity:  Not on file  Other Topics Concern  . Not on file  Social History Narrative  . Not on file    Outpatient Encounter Medications as of 03/12/2019  Medication Sig  . amLODipine (NORVASC) 5 MG tablet Take 1 tablet (5 mg total) by mouth daily.  Marland Kitchen atorvastatin (LIPITOR) 80 MG tablet Take 1 tablet (80 mg total) by mouth daily.  . clopidogrel (PLAVIX) 75 MG tablet Take 1 tablet (75 mg total) by mouth daily.  . hydrochlorothiazide (HYDRODIURIL) 25 MG tablet Take 1 tablet (25 mg total) by mouth daily.  Marland Kitchen losartan (COZAAR) 100 MG tablet Take 1 tablet (100 mg total) by mouth daily.  . metFORMIN (GLUCOPHAGE) 500 MG tablet Take 1 tablet (500 mg total) by mouth 2 (two) times daily. With meal.   No facility-administered encounter  medications on file as of 03/12/2019.     Allergies  Allergen Reactions  . Lisinopril Swelling  . Latex Rash    Review of Systems  Constitutional: Negative.   HENT: Negative.   Eyes: Positive for discharge and redness.  Respiratory: Negative.     Objective:  There were no vitals taken for this visit.  No apparent respiratory symptoms during telephonic interview.  Assessment and Plan :  1. Acute conjunctivitis of right eye, unspecified acute conjunctivitis type Redness, itching, tearing and burning with rubbing the right eye over the past 2 days. Has had "Pink Eye" infection in the past. Usually has Dr. Matilde Sprang evaluate eyes for diabetic retinopathy annually. Refill the Bleph-10 she has used in the past and should recheck eyes here or with Dr. Matilde Sprang if no better in 3-4 days. - sulfacetamide (BLEPH-10) 10 % ophthalmic solution; Place 2 drops into the right eye 4 (four) times daily.  Dispense: 15 mL; Refill: 0  2. Type 2 diabetes mellitus without complication, without long-term current use of insulin ([facility]) Still taking the Metformin 500 mg BID and trying to follow a diabetic diet. Taking Atorvastatin 80 mg qd and Losartan 100 mg qd. Plans physical with Denice Bors 06-25-19 with fasting labs.  3. Essential (primary) hypertension Has not checked BP recently. Needs fasting labs and should continue Losartan 100 mg qd, HCTZ 25 mg qd and Amlodipine 5 mg qd to maintain control.     I discussed the assessment and treatment plan with the patient. The patient was provided an opportunity to ask questions and all were answered. The patient agreed with the plan and demonstrated an understanding of the instructions.   The patient was advised to call back or seek an in-person evaluation if the symptoms worsen or if the condition fails to improve as anticipated.  I provided 10 minutes of non-face-to-face time during this encounter.   Vernie Murders, PA

## 2019-03-27 ENCOUNTER — Other Ambulatory Visit: Payer: Self-pay

## 2019-03-27 ENCOUNTER — Ambulatory Visit (INDEPENDENT_AMBULATORY_CARE_PROVIDER_SITE_OTHER): Payer: Medicare Other

## 2019-03-27 DIAGNOSIS — Z23 Encounter for immunization: Secondary | ICD-10-CM | POA: Diagnosis not present

## 2019-04-12 ENCOUNTER — Other Ambulatory Visit: Payer: Self-pay

## 2019-04-12 DIAGNOSIS — Z20822 Contact with and (suspected) exposure to covid-19: Secondary | ICD-10-CM

## 2019-04-14 LAB — NOVEL CORONAVIRUS, NAA: SARS-CoV-2, NAA: NOT DETECTED

## 2019-04-16 NOTE — Progress Notes (Deleted)
       Patient: Kristin Mora Female    DOB: January 19, 1956   63 y.o.   MRN: [id number] Visit Date: 04/16/2019  Today's Provider: Trinna Post, PA-C   No chief complaint on file.  Subjective:     HPI  Diabetes Mellitus Type II, Follow-up:   Lab Results  Component Value Date   HGBA1C 6.4 (A) 06/22/2018   HGBA1C 7.0 10/20/2017   HGBA1C 7.0 07/17/2017    Last seen for diabetes 1 months ago.  Management since then includes none. She reports {excellent/good/fair/poor:19665} compliance with treatment. She {ACTION; IS/IS [id number] having side effects. *** Current symptoms include {Symptoms; diabetes:14075} and have been {Desc; course:15616}. Home blood sugar records: {diabetes glucometry results:16657}  Episodes of hypoglycemia? {yes***/no:17258}   {Current insulin regiment:22600::"***":1} Most Recent Eye Exam: *** Weight trend: {trend:16658} {Prior visit with dietician:20300:::1} {Current exercise:16438:::1} {Current diet habits:16563:::1}  Pertinent Labs:    Component Value Date/Time   CHOL 167 10/20/2017 1136   TRIG 92 10/20/2017 1136   HDL 38 (L) 10/20/2017 1136   LDLCALC 111 (H) 10/20/2017 1136   CREATININE 0.72 10/20/2017 1136   CREATININE 0.81 10/01/2014 0757    Wt Readings from Last 3 Encounters:  06/22/18 (!) 312 lb 3.2 oz (141.6 kg)  10/20/17 (!) 309 lb 3.2 oz (140.3 kg)  07/17/17 (!) 310 lb (140.6 kg)    ------------------------------------------------------------------------  Allergies  Allergen Reactions  . Lisinopril Swelling  . Latex Rash     Current Outpatient Medications:  .  amLODipine (NORVASC) 5 MG tablet, Take 1 tablet (5 mg total) by mouth daily., Disp: 90 tablet, Rfl: 1 .  atorvastatin (LIPITOR) 80 MG tablet, Take 1 tablet (80 mg total) by mouth daily., Disp: 90 tablet, Rfl: 3 .  clopidogrel (PLAVIX) 75 MG tablet, Take 1 tablet (75 mg total) by mouth daily., Disp: 90 tablet, Rfl: 3 .  hydrochlorothiazide (HYDRODIURIL) 25 MG  tablet, Take 1 tablet (25 mg total) by mouth daily., Disp: 90 tablet, Rfl: 3 .  losartan (COZAAR) 100 MG tablet, Take 1 tablet (100 mg total) by mouth daily., Disp: 90 tablet, Rfl: 3 .  metFORMIN (GLUCOPHAGE) 500 MG tablet, Take 1 tablet (500 mg total) by mouth 2 (two) times daily. With meal., Disp: 180 tablet, Rfl: 1 .  sulfacetamide (BLEPH-10) 10 % ophthalmic solution, Place 2 drops into the right eye 4 (four) times daily., Disp: 15 mL, Rfl: 0  Review of Systems  Social History   Tobacco Use  . Smoking status: Former Smoker    Quit date: 11/25/2009    Years since quitting: 9.3  . Smokeless tobacco: Never Used  Substance Use Topics  . Alcohol use: No    Alcohol/week: 0.0 standard drinks      Objective:   There were no vitals taken for this visit. There were no vitals filed for this visit.There is no height or weight on file to calculate BMI.   Physical Exam   No results found for any visits on 04/17/19.     Assessment & Pyatt, PA-C  [facility]

## 2019-04-17 ENCOUNTER — Ambulatory Visit: Payer: Medicare Other | Admitting: Physician Assistant

## 2019-04-17 ENCOUNTER — Telehealth: Payer: Self-pay | Admitting: Physician Assistant

## 2019-04-17 NOTE — Telephone Encounter (Signed)
Dismissal letter due to no shows printed.

## 2019-04-25 ENCOUNTER — Telehealth: Payer: Self-pay | Admitting: Family Medicine

## 2019-04-25 NOTE — Telephone Encounter (Signed)
I will forward to Environmental education officer, whom I have reviewed dismissal with prior to sending out letter.

## 2019-04-25 NOTE — Telephone Encounter (Signed)
Pt called saying saying she received a letter dismissing her from the practice because of missed appts.  Pt says she has not missed a lot of appts and does not understand why she is being dismissed.  She ask if this was something new we were doing and I explained to her that is was a [facility] policy but she wanted to speak to someone about this.    CB#  [phone]    Con Memos

## 2019-04-25 NOTE — Telephone Encounter (Signed)
Im not sure of our policy on this, do I forward this to the office manager to review? KW

## 2019-05-10 ENCOUNTER — Other Ambulatory Visit: Payer: Self-pay | Admitting: Family Medicine

## 2019-05-10 ENCOUNTER — Telehealth: Payer: Self-pay | Admitting: Physician Assistant

## 2019-05-10 DIAGNOSIS — I10 Essential (primary) hypertension: Secondary | ICD-10-CM

## 2019-05-10 MED ORDER — AMLODIPINE BESYLATE 5 MG PO TABS: 5.0000 mg | ORAL_TABLET | Freq: Every day | ORAL | 0 refills | Status: DC

## 2019-05-10 NOTE — Telephone Encounter (Signed)
Done and remind patient to schedule follow up appointment and fasting labs.

## 2019-05-10 NOTE — Telephone Encounter (Signed)
[facility] faxed refill request for the following medications:  amLODipine (NORVASC) 5 MG tablet   90 day supply Last Rx: 06/22/2018 with 1 refill LOV: 03/12/2019 with Simona Huh Please advise. Thanks TNP

## 2019-06-11 ENCOUNTER — Telehealth: Payer: Self-pay

## 2019-06-11 NOTE — Telephone Encounter (Signed)
This was routed to [place] by mistake.she has not seen this patient before. 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

## 2019-06-11 NOTE — Telephone Encounter (Signed)
Copied from [place] [id number]. Topic: General - Other >> Jun 11, 2019 11:59 AM Celene Kras wrote: Reason for CRM: Pt called to verify her appt; however, there is no appt scheduled. Pt declined to set up a new appt and is requesting to have a call back tp find out why her last one was cancelled and she was not notified. Please advise.

## 2019-06-11 NOTE — Telephone Encounter (Signed)
LMTCB 06/11/2019   Thanks,   -Mickel Baas

## 2019-06-11 NOTE — Telephone Encounter (Signed)
The patient has been dismissed due to multiple no shows. Dismissal letter sent 04/17/2019. Patient called back on 04/25/2019 asking why she got a dismissal letter and it was explained to her then as well. The appointment has been cancelled because patient has been dismissed from this practice.

## 2019-06-24 NOTE — Progress Notes (Signed)
Patient: Kristin Mora, Female    DOB: 06/25/1956, 63 y.o.   MRN: [id number] Visit Date: 06/25/2019  Today's Provider: Mar Daring, PA-C   Chief Complaint  Patient presents with  . Annual Exam   Subjective:     Annual wellness visit Kristin Mora is a 63 y.o. female. She feels well. She reports exercising no. She reports she is sleeping fairly well. -----------------------------------------------------------   Review of Systems  Constitutional: Negative.   HENT: Negative.   Eyes: Negative.   Respiratory: Negative.   Cardiovascular: Negative.   Gastrointestinal: Negative.   Endocrine: Negative.   Genitourinary: Negative.   Musculoskeletal: Negative.   Skin: Negative.   Allergic/Immunologic: Negative.   Neurological: Negative.   Hematological: Negative.   Psychiatric/Behavioral: Negative.     Social History   Socioeconomic History  . Marital status: Legally Separated    Spouse name: Not on file  . Number of children: Not on file  . Years of education: Not on file  . Highest education level: Not on file  Occupational History  . Occupation: disabled  Tobacco Use  . Smoking status: Former Smoker    Quit date: 11/25/2009    Years since quitting: 9.5  . Smokeless tobacco: Never Used  Substance and Sexual Activity  . Alcohol use: No    Alcohol/week: 0.0 standard drinks  . Drug use: No  . Sexual activity: Not on file  Other Topics Concern  . Not on file  Social History Narrative  . Not on file   Social Determinants of Health   Financial Resource Strain:   . Difficulty of Paying Living Expenses: Not on file  Food Insecurity:   . Worried About Charity fundraiser in the Last Year: Not on file  . Ran Out of Food in the Last Year: Not on file  Transportation Needs:   . Lack of Transportation (Medical): Not on file  . Lack of Transportation (Non-Medical): Not on file  Physical Activity:   . Days of Exercise per Week: Not on file  . Minutes  of Exercise per Session: Not on file  Stress:   . Feeling of Stress : Not on file  Social Connections:   . Frequency of Communication with Friends and Family: Not on file  . Frequency of Social Gatherings with Friends and Family: Not on file  . Attends Religious Services: Not on file  . Active Member of Clubs or Organizations: Not on file  . Attends Archivist Meetings: Not on file  . Marital Status: Not on file  Intimate Partner Violence:   . Fear of Current or Ex-Partner: Not on file  . Emotionally Abused: Not on file  . Physically Abused: Not on file  . Sexually Abused: Not on file    Past Medical History:  Diagnosis Date  . Asthma   . Brain aneurysm 04/2007  . Diabetes mellitus without complication ([place])    controlled;  . HLD (hyperlipidemia)   . Hypertension    controlled with medication  . Stroke ([place])   . Vertigo      Patient Active Problem List   Diagnosis Date Noted  . Screening for breast cancer 10/20/2017  . OSA (obstructive sleep apnea) 07/27/2017  . Axillary hidradenitis suppurativa 06/29/2016  . Vertigo 09/14/2015  . Chronic airway obstruction ([place]) 11/04/2014  . Avitaminosis D 11/04/2014  . Adiposity 11/04/2014  . Hypercholesteremia 11/04/2014  . Hemorrhagic cerebrovascular accident (CVA) ([place]) 11/04/2014  . Diabetes  mellitus, type 2 ([place]) 10/01/2014  . Essential (primary) hypertension 10/01/2014  . Thalamic hemorrhage ([place]) 10/01/2014    Past Surgical History:  Procedure Laterality Date  . CRANIOTOMY  04/2007  . KNEE SURGERY    . TONSILLECTOMY AND ADENOIDECTOMY    . TUBAL LIGATION      Her family history includes Bone cancer in her brother; Coronary artery disease in her mother; Diabetes in her sister; Heart attack in her mother; Lung cancer in her father and sister; Stroke in her brother; Uterine cancer in her sister.   Current Outpatient Medications:  .  amLODipine (NORVASC) 5 MG tablet, Take 1 tablet (5 mg total) by mouth daily.,  Disp: 90 tablet, Rfl: 0 .  atorvastatin (LIPITOR) 80 MG tablet, Take 1 tablet (80 mg total) by mouth daily., Disp: 90 tablet, Rfl: 3 .  clopidogrel (PLAVIX) 75 MG tablet, Take 1 tablet (75 mg total) by mouth daily., Disp: 90 tablet, Rfl: 3 .  hydrochlorothiazide (HYDRODIURIL) 25 MG tablet, Take 1 tablet (25 mg total) by mouth daily., Disp: 90 tablet, Rfl: 3 .  losartan (COZAAR) 100 MG tablet, Take 1 tablet (100 mg total) by mouth daily., Disp: 90 tablet, Rfl: 3 .  metFORMIN (GLUCOPHAGE) 500 MG tablet, Take 1 tablet (500 mg total) by mouth 2 (two) times daily. With meal., Disp: 180 tablet, Rfl: 1  Patient Care Team: Mar Daring, PA-C as PCP - General (Family Medicine)    Objective:    Vitals: BP (!) 147/83 (BP Location: Left Arm, Patient Position: Sitting, Cuff Size: Large)   Pulse 81   Temp (!) 97.1 F (36.2 C) (Temporal)   Resp 16   Ht 5\' 2"  (1.575 m)   Wt (!) 310 lb (140.6 kg)   BMI 56.70 kg/m   Physical Exam Vitals reviewed.  Constitutional:      General: She is not in acute distress.    Appearance: Normal appearance. She is well-developed. She is obese. She is not ill-appearing or diaphoretic.  HENT:     Head: Normocephalic and atraumatic.     Right Ear: Tympanic membrane, ear canal and external ear normal.     Left Ear: Tympanic membrane, ear canal and external ear normal.  Eyes:     General: No scleral icterus.       Right eye: No discharge.        Left eye: No discharge.     Extraocular Movements: Extraocular movements intact.     Conjunctiva/sclera: Conjunctivae normal.     Pupils: Pupils are equal, round, and reactive to light.  Neck:     Thyroid: No thyromegaly.     Vascular: No carotid bruit or JVD.     Trachea: No tracheal deviation.  Cardiovascular:     Rate and Rhythm: Normal rate and regular rhythm.     Pulses: Normal pulses.     Heart sounds: Normal heart sounds. No murmur. No friction rub. No gallop.   Pulmonary:     Effort: Pulmonary effort is  normal. No respiratory distress.     Breath sounds: Normal breath sounds. No wheezing or rales.  Chest:     Chest wall: No tenderness.     Breasts:        Right: Normal. No mass, skin change or tenderness.        Left: Normal. No mass, skin change or tenderness.  Abdominal:     General: Abdomen is protuberant. Bowel sounds are normal. There is no distension.  Palpations: Abdomen is soft. There is no mass.     Tenderness: There is no abdominal tenderness. There is no guarding or rebound.     Hernia: No hernia is present.  Musculoskeletal:        General: No tenderness. Normal range of motion.     Cervical back: Normal range of motion and neck supple.  Lymphadenopathy:     Cervical: No cervical adenopathy.     Upper Body:     Right upper body: No supraclavicular, axillary or pectoral adenopathy.     Left upper body: No supraclavicular, axillary or pectoral adenopathy.  Skin:    General: Skin is warm and dry.     Capillary Refill: Capillary refill takes less than 2 seconds.     Findings: No rash.  Neurological:     General: No focal deficit present.     Mental Status: She is alert and oriented to person, place, and time. Mental status is at baseline.  Psychiatric:        Mood and Affect: Mood normal.        Behavior: Behavior normal.        Thought Content: Thought content normal.        Judgment: Judgment normal.     Activities of Daily Living In your present state of health, do you have any difficulty performing the following activities: 06/25/2019  Hearing? N  Vision? Y  Difficulty concentrating or making decisions? Y  Walking or climbing stairs? N  Dressing or bathing? Y  Doing errands, shopping? Y  Some recent data might be hidden    Fall Risk Assessment Fall Risk  06/25/2019 06/22/2018  Falls in the past year? 0 0  Number falls in past yr: 0 -  Injury with Fall? 0 -  Follow up Falls evaluation completed -     Depression Screen PHQ 2/9 Scores 06/25/2019  06/22/2018  PHQ - 2 Score 0 0  PHQ- 9 Score 9 -    No flowsheet data found.    Assessment & Plan:     Annual Wellness Visit  Reviewed patient's Family Medical History Reviewed and updated list of patient's medical providers Assessment of cognitive impairment was done Assessed patient's functional ability Established a written schedule for health screening [place] Completed and Reviewed  Exercise Activities and Dietary recommendations Goals   None     Immunization History  Administered Date(s) Administered  . Fluad Quad(high Dose 65+) 03/27/2019  . Influenza,inj,Quad PF,6+ Mos 05/26/2015  . Pneumococcal Polysaccharide-23 04/18/2012  . Tdap 12/27/2010    Health Maintenance  Topic Date Due  . Hepatitis C Screening  07-18-1955  . FOOT EXAM  10/05/1963  . HIV Screening  10/04/1968  . COLONOSCOPY  10/05/2003  . PAP SMEAR-Modifier  08/02/2005  . MAMMOGRAM  01/30/2012  . OPHTHALMOLOGY EXAM  09/13/2012  . DEXA SCAN  10/05/2018  . PNA vac Low Risk Adult (1 of 2 - PCV13) 10/05/2018  . HEMOGLOBIN A1C  12/22/2018  . TETANUS/TDAP  12/26/2020  . INFLUENZA VACCINE  Completed     Discussed health benefits of physical activity, and encouraged her to engage in regular exercise appropriate for her age and condition.    1. Medicare annual wellness visit, subsequent Normal exam. Screenings ordered. Vaccinations updated.  2. Colon cancer screening Patient has never had colon cancer screening. No first degree relative with colon cancer. Cologuard ordered as below.  - Cologuard  3. Encounter for screening mammogram for malignant neoplasm of breast  Breast exam today was normal. There is no family history of breast cancer. She does perform regular self breast exams. Mammogram was ordered as below. Information for [facility] Breast clinic was given to patient so she may schedule her mammogram at her convenience. - MM 3D SCREEN BREAST BILATERAL; Future  4.  Postmenopausal estrogen deficiency No previous BMD. Ordered as below for baseline screen for osteoporosis.  - DG Bone Density; Future  5. Essential hypertension Stable. Continue Amlodipine 5mg , Losartan 100mg , and HCTZ 25mg . Will check labs as below and f/u pending results. - CBC with Differential/Platelet - Comprehensive metabolic panel - TSH  6. Type 2 diabetes mellitus without complication, without long-term current use of insulin (HCC) Stable. Continue Metformin 500mg  BID. Will check labs as below and f/u pending results. - Hemoglobin A1c - TSH - Pneumococcal polysaccharide vaccine 23-valent greater than or equal to 2yo subcutaneous/IM  7. Hypercholesteremia Stable. Continue Atorvastatin 80mg . Will check labs as below and f/u pending results. - Lipid Panel With LDL/HDL Ratio - TSH  8. Class 3 severe obesity due to excess calories with serious comorbidity and body mass index (BMI) of 50.0 to 59.9 in adult [facility]) Counseled patient on healthy lifestyle modifications including dieting and exercise.   9. Screening for HIV (human immunodeficiency virus) Will check labs as below and f/u pending results. - HIV antibody (with reflex)  10. Need for hepatitis C screening test Will check labs as below and f/u pending results. - Hepatitis C antibody  11. Need for pneumococcal vaccination Pneumococcal 23 Vaccine given to patient without complications. Patient sat for 15 minutes after administration and was tolerated well without adverse effects. - Pneumococcal polysaccharide vaccine 23-valent greater than or equal to 2yo subcutaneous/IM  ------------------------------------------------------------------------------------------------------------    Mar Daring, PA-C  [facility]

## 2019-06-25 ENCOUNTER — Encounter: Payer: Self-pay | Admitting: Physician Assistant

## 2019-06-25 ENCOUNTER — Ambulatory Visit: Payer: Medicare Other | Admitting: Physician Assistant

## 2019-06-25 ENCOUNTER — Ambulatory Visit (INDEPENDENT_AMBULATORY_CARE_PROVIDER_SITE_OTHER): Payer: Medicare Other | Admitting: Physician Assistant

## 2019-06-25 ENCOUNTER — Other Ambulatory Visit: Payer: Self-pay

## 2019-06-25 VITALS — BP 147/83 | HR 81 | Temp 97.1°F | Resp 16 | Ht 62.0 in | Wt 310.0 lb

## 2019-06-25 DIAGNOSIS — Z0000 Encounter for general adult medical examination without abnormal findings: Secondary | ICD-10-CM

## 2019-06-25 DIAGNOSIS — Z23 Encounter for immunization: Secondary | ICD-10-CM | POA: Diagnosis not present

## 2019-06-25 DIAGNOSIS — Z6841 Body Mass Index (BMI) 40.0 and over, adult: Secondary | ICD-10-CM

## 2019-06-25 DIAGNOSIS — Z1159 Encounter for screening for other viral diseases: Secondary | ICD-10-CM

## 2019-06-25 DIAGNOSIS — Z114 Encounter for screening for human immunodeficiency virus [HIV]: Secondary | ICD-10-CM

## 2019-06-25 DIAGNOSIS — E119 Type 2 diabetes mellitus without complications: Secondary | ICD-10-CM | POA: Diagnosis not present

## 2019-06-25 DIAGNOSIS — Z780 Asymptomatic menopausal state: Secondary | ICD-10-CM | POA: Diagnosis not present

## 2019-06-25 DIAGNOSIS — E7800 Pure hypercholesterolemia, unspecified: Secondary | ICD-10-CM

## 2019-06-25 DIAGNOSIS — I10 Essential (primary) hypertension: Secondary | ICD-10-CM

## 2019-06-25 DIAGNOSIS — Z1231 Encounter for screening mammogram for malignant neoplasm of breast: Secondary | ICD-10-CM

## 2019-06-25 DIAGNOSIS — Z1211 Encounter for screening for malignant neoplasm of colon: Secondary | ICD-10-CM | POA: Diagnosis not present

## 2019-06-25 NOTE — Patient Instructions (Signed)

## 2019-06-26 ENCOUNTER — Telehealth: Payer: Self-pay

## 2019-06-26 LAB — CBC WITH DIFFERENTIAL/PLATELET
Basophils Absolute: 0.1 10*3/uL (ref 0.0–0.2)
Basos: 0 %
EOS (ABSOLUTE): 0.2 10*3/uL (ref 0.0–0.4)
Eos: 2 %
Hematocrit: 45.2 % (ref 34.0–46.6)
Hemoglobin: 14.6 g/dL (ref 11.1–15.9)
Immature Grans (Abs): 0.1 10*3/uL (ref 0.0–0.1)
Immature Granulocytes: 1 %
Lymphocytes Absolute: 2.7 10*3/uL (ref 0.7–3.1)
Lymphs: 22 %
MCH: 28.3 pg (ref 26.6–33.0)
MCHC: 32.3 g/dL (ref 31.5–35.7)
MCV: 88 fL (ref 79–97)
Monocytes Absolute: 0.8 10*3/uL (ref 0.1–0.9)
Monocytes: 7 %
Neutrophils Absolute: 8.1 10*3/uL — ABNORMAL HIGH (ref 1.4–7.0)
Neutrophils: 68 %
Platelets: 257 10*3/uL (ref 150–450)
RBC: 5.16 x10E6/uL (ref 3.77–5.28)
RDW: 14.7 % (ref 11.7–15.4)
WBC: 12 10*3/uL — ABNORMAL HIGH (ref 3.4–10.8)

## 2019-06-26 LAB — COMPREHENSIVE METABOLIC PANEL
ALT: 21 IU/L (ref 0–32)
AST: 20 IU/L (ref 0–40)
Albumin/Globulin Ratio: 1.1 — ABNORMAL LOW (ref 1.2–2.2)
Albumin: 3.7 g/dL — ABNORMAL LOW (ref 3.8–4.8)
Alkaline Phosphatase: 73 IU/L (ref 39–117)
BUN/Creatinine Ratio: 19 (ref 12–28)
BUN: 18 mg/dL (ref 8–27)
Bilirubin Total: 0.7 mg/dL (ref 0.0–1.2)
CO2: 28 mmol/L (ref 20–29)
Calcium: 9 mg/dL (ref 8.7–10.3)
Chloride: 102 mmol/L (ref 96–106)
Creatinine, Ser: 0.93 mg/dL (ref 0.57–1.00)
GFR calc Af Amer: 75 mL/min/{1.73_m2} (ref >59)
GFR calc non Af Amer: 65 mL/min/{1.73_m2} (ref >59)
Globulin, Total: 3.5 g/dL (ref 1.5–4.5)
Glucose: 116 mg/dL — ABNORMAL HIGH (ref 65–99)
Potassium: 3.9 mmol/L (ref 3.5–5.2)
Sodium: 143 mmol/L (ref 134–144)
Total Protein: 7.2 g/dL (ref 6.0–8.5)

## 2019-06-26 LAB — LIPID PANEL WITH LDL/HDL RATIO
Cholesterol, Total: 126 mg/dL (ref 100–199)
HDL: 34 mg/dL — ABNORMAL LOW (ref >39)
LDL Chol Calc (NIH): 67 mg/dL (ref 0–99)
LDL/HDL Ratio: 2 ratio (ref 0.0–3.2)
Triglycerides: 144 mg/dL (ref 0–149)
VLDL Cholesterol Cal: 25 mg/dL (ref 5–40)

## 2019-06-26 LAB — HEMOGLOBIN A1C
Est. average glucose Bld gHb Est-mCnc: 146 mg/dL
Hgb A1c MFr Bld: 6.7 % — ABNORMAL HIGH (ref 4.8–5.6)

## 2019-06-26 LAB — TSH: TSH: 1.44 u[IU]/mL (ref 0.450–4.500)

## 2019-06-26 LAB — HIV ANTIBODY (ROUTINE TESTING W REFLEX): HIV Screen 4th Generation wRfx: NONREACTIVE

## 2019-06-26 LAB — HEPATITIS C ANTIBODY: Hep C Virus Ab: <0.1 s/co ratio (ref 0.0–0.9)

## 2019-06-26 NOTE — Telephone Encounter (Signed)
-----   Message from Mar Daring, PA-C sent at 06/26/2019  9:53 AM EST ----- WBC count is slightly elevated most likely indicating a small viral infection. We can recheck when she returns for her 3 month f/u. Kidney function and liver function are normal. Sodium, potassium and calcium are normal. A1c is up slightly to 6.7 from 6.4. Continue metformin 500mg  BID and limit sugars and carbs in diet. Cholesterol is normal. HIV screen done once in a lifetime is negative. Hep C screen is negative. Thyroid is normal.

## 2019-06-26 NOTE — Telephone Encounter (Signed)
LMTCB

## 2019-08-13 ENCOUNTER — Ambulatory Visit: Payer: Medicare Other | Attending: Internal Medicine

## 2019-08-21 ENCOUNTER — Other Ambulatory Visit: Payer: Self-pay | Admitting: Physician Assistant

## 2019-08-21 DIAGNOSIS — I10 Essential (primary) hypertension: Secondary | ICD-10-CM

## 2019-08-21 MED ORDER — ATORVASTATIN CALCIUM 80 MG PO TABS: 80.0000 mg | ORAL_TABLET | Freq: Every day | ORAL | 3 refills | Status: DC

## 2019-08-21 MED ORDER — HYDROCHLOROTHIAZIDE 25 MG PO TABS: 25.0000 mg | ORAL_TABLET | Freq: Every day | ORAL | 3 refills | Status: DC

## 2019-08-21 MED ORDER — LOSARTAN POTASSIUM 100 MG PO TABS: 100.0000 mg | ORAL_TABLET | Freq: Every day | ORAL | 3 refills | Status: DC

## 2019-08-21 NOTE — Telephone Encounter (Signed)
Requested medication (s) are due for refill today: yes  Requested medication (s) are on the active medication list: yes  Last refill:  06/22/18  Future visit scheduled: yes  Notes to clinic:  Rx have expired.    Requested Prescriptions  Pending Prescriptions Disp Refills   atorvastatin (LIPITOR) 80 MG tablet 90 tablet 3    Sig: Take 1 tablet (80 mg total) by mouth daily.      Cardiovascular:  Antilipid - Statins Failed - 08/21/2019 11:40 AM      Failed - LDL in normal range and within 360 days    LDL Chol Calc (NIH)  Date Value Ref Range Status  06/25/2019 67 0 - 99 mg/dL Final          Failed - HDL in normal range and within 360 days    HDL  Date Value Ref Range Status  06/25/2019 34 (L) >39 mg/dL Final          Passed - Total Cholesterol in normal range and within 360 days    Cholesterol, Total  Date Value Ref Range Status  06/25/2019 126 100 - 199 mg/dL Final          Passed - Triglycerides in normal range and within 360 days    Triglycerides  Date Value Ref Range Status  06/25/2019 144 0 - 149 mg/dL Final          Passed - Patient is not pregnant      Passed - Valid encounter within last 12 months    Recent Outpatient Visits           1 month ago Medicare annual wellness visit, subsequent   [facility], [place], PA-C   5 months ago Acute conjunctivitis of right eye, unspecified acute conjunctivitis type   [facility], [place], [place]   1 year ago Type 2 diabetes mellitus without complication, without long-term current use of insulin ([place])   [place], [place]   1 year ago Type 2 diabetes mellitus without complication, without long-term current use of insulin ([place])   [place], [place]   2 years ago Type 2 diabetes mellitus without complication, without long-term current use of insulin ([place])   [place], [place]        Future Appointments             In 1 month [place], Clearnce Sorrel, PA-C [facility]              hydrochlorothiazide (HYDRODIURIL) 25 MG tablet 90 tablet 3    Sig: Take 1 tablet (25 mg total) by mouth daily.      Cardiovascular: Diuretics - Thiazide Failed - 08/21/2019 11:40 AM      Failed - Last BP in normal range    BP Readings from Last 1 Encounters:  06/25/19 (!) 147/83          Passed - Ca in normal range and within 360 days    Calcium  Date Value Ref Range Status  06/25/2019 9.0 8.7 - 10.3 mg/dL Final   Calcium, Total  Date Value Ref Range Status  10/01/2014 9.5 mg/dL Final    Comment:    8.9-10.3 NOTE: New Reference Range  09/02/14           Passed - Cr in normal range and within 360 days    Creatinine  Date Value Ref Range Status  10/01/2014 0.81  mg/dL Final    Comment:    0.44-1.00 NOTE: New Reference Range  09/02/14    Creatinine, Ser  Date Value Ref Range Status  06/25/2019 0.93 0.57 - 1.00 mg/dL Final          Passed - K in normal range and within 360 days    Potassium  Date Value Ref Range Status  06/25/2019 3.9 3.5 - 5.2 mmol/L Final  10/01/2014 3.7 mmol/L Final    Comment:    3.5-5.1 NOTE: New Reference Range  09/02/14           Passed - Na in normal range and within 360 days    Sodium  Date Value Ref Range Status  06/25/2019 143 134 - 144 mmol/L Final  10/01/2014 140 mmol/L Final    Comment:    135-145 NOTE: New Reference Range  09/02/14           Passed - Valid encounter within last 6 months    Recent Outpatient Visits           1 month ago Medicare annual wellness visit, subsequent   [facility], [place], [place]   5 months ago Acute conjunctivitis of right eye, unspecified acute conjunctivitis type   [facility], [place], [place]   1 year ago Type 2 diabetes mellitus without complication, without long-term current use of insulin ([place])   [facility], [place], [place]   1 year ago Type 2 diabetes mellitus without complication, without long-term current use of insulin ([place])   [place], [place], [place]   2 years ago Type 2 diabetes mellitus without complication, without long-term current use of insulin ([place])   [place], [place]       Future Appointments             In 1 month Burnette, Clearnce Sorrel, PA-C [facility], [facility]              losartan (COZAAR) 100 MG tablet 90 tablet 3    Sig: Take 1 tablet (100 mg total) by mouth daily.      Cardiovascular:  Angiotensin Receptor Blockers Failed - 08/21/2019 11:40 AM      Failed - Last BP in normal range    BP Readings from Last 1 Encounters:  06/25/19 (!) 147/83          Passed - Cr in normal range and within 180 days    Creatinine  Date Value Ref Range Status  10/01/2014 0.81 mg/dL Final    Comment:    0.44-1.00 NOTE: New Reference Range  09/02/14    Creatinine, Ser  Date Value Ref Range Status  06/25/2019 0.93 0.57 - 1.00 mg/dL Final          Passed - K in normal range and within 180 days    Potassium  Date Value Ref Range Status  06/25/2019 3.9 3.5 - 5.2 mmol/L Final  10/01/2014 3.7 mmol/L Final    Comment:    3.5-5.1 NOTE: New Reference Range  09/02/14           Passed - Patient is not pregnant      Passed - Valid encounter within last 6 months    Recent Outpatient Visits           1 month ago Medicare annual wellness visit, subsequent   [facility], [place], [place]   5 months ago Acute conjunctivitis of right  eye, unspecified acute conjunctivitis type   [facility], [place], [place]   1 year ago Type 2 diabetes mellitus without complication, without long-term current use of insulin ([place])   [place], [place]   1 year ago Type 2 diabetes mellitus without complication, without long-term current use  of insulin [facility])   [place], [place]   2 years ago Type 2 diabetes mellitus without complication, without long-term current use of insulin [facility])   [place], [place]       Future Appointments             In 1 month Burnette, Clearnce Sorrel, PA-C [employer] [facility], [place]

## 2019-08-21 NOTE — Telephone Encounter (Signed)
Copied from [place] [id number]. Topic: Quick Communication - Rx Refill/Question >> Aug 21, 2019 11:25 AM Leward Quan A wrote: Medication: losartan (COZAAR) 100 MG tablet, atorvastatin (LIPITOR) 80 MG tablet hydrochlorothiazide (HYDRODIURIL) 25 MG tablet,   Has the patient contacted their pharmacy? Yes.   (Agent: If no, request that the patient contact the pharmacy for the refill.) (Agent: If yes, when and what did the pharmacy advise?)  Preferred Pharmacy (with phone number or street name): [facility] [place] [facility] [place]  Phone:  [phone] Fax:  [phone]     Agent: Please be advised that RX refills may take up to 3 business days. We ask that you follow-up with your pharmacy.

## 2019-08-22 ENCOUNTER — Ambulatory Visit (INDEPENDENT_AMBULATORY_CARE_PROVIDER_SITE_OTHER): Payer: Medicare Other | Admitting: Physician Assistant

## 2019-08-22 ENCOUNTER — Encounter: Payer: Self-pay | Admitting: Physician Assistant

## 2019-08-22 DIAGNOSIS — T3695XA Adverse effect of unspecified systemic antibiotic, initial encounter: Secondary | ICD-10-CM

## 2019-08-22 DIAGNOSIS — I10 Essential (primary) hypertension: Secondary | ICD-10-CM

## 2019-08-22 DIAGNOSIS — J0140 Acute pansinusitis, unspecified: Secondary | ICD-10-CM

## 2019-08-22 DIAGNOSIS — R05 Cough: Secondary | ICD-10-CM

## 2019-08-22 DIAGNOSIS — R059 Cough, unspecified: Secondary | ICD-10-CM

## 2019-08-22 DIAGNOSIS — B379 Candidiasis, unspecified: Secondary | ICD-10-CM

## 2019-08-22 MED ORDER — BENZONATATE 200 MG PO CAPS: 200.0000 mg | ORAL_CAPSULE | Freq: Two times a day (BID) | ORAL | 0 refills | Status: DC | PRN

## 2019-08-22 MED ORDER — AMOXICILLIN-POT CLAVULANATE 875-125 MG PO TABS: 1.0000 | ORAL_TABLET | Freq: Two times a day (BID) | ORAL | 0 refills | Status: DC

## 2019-08-22 MED ORDER — LOSARTAN POTASSIUM 100 MG PO TABS: 100.0000 mg | ORAL_TABLET | Freq: Every day | ORAL | 3 refills | Status: DC

## 2019-08-22 MED ORDER — FLUCONAZOLE 150 MG PO TABS: 150.0000 mg | ORAL_TABLET | Freq: Once | ORAL | 0 refills | Status: AC

## 2019-08-22 NOTE — Progress Notes (Signed)
Patient: Kristin Mora Female    DOB: March 05, 1956   64 y.o.   MRN: [id number] Visit Date: 08/22/2019  Today's Provider: Mar Daring, PA-C   No chief complaint on file.  Subjective:      Virtual Visit via Telephone Note  I connected with [name] on 08/22/19 at  9:40 AM EST by telephone and verified that I am speaking with the correct person using two identifiers.  Location: Patient: Home Provider: [facility]   I discussed the limitations, risks, security and privacy concerns of performing an evaluation and management service by telephone and the availability of in person appointments. I also discussed with the patient that there may be a patient responsible charge related to this service. The patient expressed understanding and agreed to proceed.   Sinusitis This is a new problem. The current episode started 1 to 4 weeks ago. The problem has been gradually worsening (Started about two weeks ago. ) since onset. Associated symptoms include congestion, coughing and sinus pressure. Pertinent negatives include no ear pain, headaches, hoarse voice, shortness of breath, sneezing or sore throat. The treatment provided mild relief.  Has been using Coricidin HBP and cough drops  Allergies  Allergen Reactions  . Lisinopril Swelling  . Latex Rash     Current Outpatient Medications:  .  amLODipine (NORVASC) 5 MG tablet, Take 1 tablet (5 mg total) by mouth daily., Disp: 90 tablet, Rfl: 0 .  atorvastatin (LIPITOR) 80 MG tablet, Take 1 tablet (80 mg total) by mouth daily., Disp: 90 tablet, Rfl: 3 .  clopidogrel (PLAVIX) 75 MG tablet, Take 1 tablet (75 mg total) by mouth daily., Disp: 90 tablet, Rfl: 3 .  hydrochlorothiazide (HYDRODIURIL) 25 MG tablet, Take 1 tablet (25 mg total) by mouth daily., Disp: 90 tablet, Rfl: 3 .  losartan (COZAAR) 100 MG tablet, Take 1 tablet (100 mg total) by mouth daily., Disp: 90 tablet, Rfl: 3 .  metFORMIN (GLUCOPHAGE) 500 MG tablet, Take 1 tablet  (500 mg total) by mouth 2 (two) times daily. With meal., Disp: 180 tablet, Rfl: 1  Review of Systems  Constitutional: Negative.  Negative for fatigue and fever.  HENT: Positive for congestion, postnasal drip, sinus pressure and sinus pain. Negative for ear discharge, ear pain, hoarse voice, nosebleeds, rhinorrhea, sneezing, sore throat and trouble swallowing.        No loss of smell or taste  Eyes: Negative.   Respiratory: Positive for cough. Negative for apnea, choking, chest tightness, shortness of breath, wheezing and stridor.   Cardiovascular: Negative.  Negative for chest pain, palpitations and leg swelling.  Gastrointestinal: Negative.  Negative for abdominal pain and nausea.  Neurological: Negative for dizziness, light-headedness and headaches.    Social History   Tobacco Use  . Smoking status: Former Smoker    Quit date: 11/25/2009    Years since quitting: 9.7  . Smokeless tobacco: Never Used  Substance Use Topics  . Alcohol use: No    Alcohol/week: 0.0 standard drinks      Objective:   There were no vitals taken for this visit. There were no vitals filed for this visit.There is no height or weight on file to calculate BMI.   Physical Exam Vitals reviewed.  Constitutional:      General: She is not in acute distress. Pulmonary:     Effort: No respiratory distress.  Neurological:     Mental Status: She is alert.      No results found  for any visits on 08/22/19.     Assessment & Plan     1. Acute non-recurrent pansinusitis Worsening symptoms that have not responded to OTC medications. Will give augmentin as below. Continue allergy medications. Stay well hydrated and get plenty of rest. Call if no symptom improvement or if symptoms worsen. - amoxicillin-clavulanate (AUGMENTIN) 875-125 MG tablet; Take 1 tablet by mouth 2 (two) times daily.  Dispense: 20 tablet; Refill: 0  2. Essential (primary) hypertension Stable. Diagnosis pulled for medication refill. Continue  current medical treatment plan. - losartan (COZAAR) 100 MG tablet; Take 1 tablet (100 mg total) by mouth daily.  Dispense: 90 tablet; Refill: 3  3. Cough Tessalon perles for cough prn. - benzonatate (TESSALON) 200 MG capsule; Take 1 capsule (200 mg total) by mouth 2 (two) times daily as needed for cough.  Dispense: 20 capsule; Refill: 0  4. Antibiotic-induced yeast infection Gets yeast infections with antibiotics. Diflucan given as below.  - fluconazole (DIFLUCAN) 150 MG tablet; Take 1 tablet (150 mg total) by mouth once for 1 dose. May repeat in 48-72 hrs if needed  Dispense: 2 tablet; Refill: 0    I discussed the assessment and treatment plan with the patient. The patient was provided an opportunity to ask questions and all were answered. The patient agreed with the plan and demonstrated an understanding of the instructions.   The patient was advised to call back or seek an in-person evaluation if the symptoms worsen or if the condition fails to improve as anticipated.  I provided 8 minutes of non-face-to-face time during this encounter.    Mar Daring, PA-C  [facility]

## 2019-09-10 ENCOUNTER — Other Ambulatory Visit: Payer: Self-pay | Admitting: Physician Assistant

## 2019-09-10 DIAGNOSIS — E119 Type 2 diabetes mellitus without complications: Secondary | ICD-10-CM

## 2019-09-10 MED ORDER — METFORMIN HCL 500 MG PO TABS: 500.0000 mg | ORAL_TABLET | Freq: Two times a day (BID) | ORAL | 1 refills | Status: DC

## 2019-09-10 NOTE — Telephone Encounter (Signed)
[facility] faxed refill request for the following medications:  metFORMIN (GLUCOPHAGE) 500 MG tablet   Please advise.  Thanks, [employer]

## 2019-09-11 ENCOUNTER — Other Ambulatory Visit: Payer: Medicare Other

## 2019-09-23 ENCOUNTER — Ambulatory Visit: Payer: Self-pay | Admitting: Physician Assistant

## 2019-09-30 ENCOUNTER — Other Ambulatory Visit: Payer: Medicare Other

## 2019-09-30 NOTE — Progress Notes (Signed)
Established patient visit      Patient: Kristin Mora   DOB: May 20, 1956   64 y.o. Female  MRN: [id number] Visit Date: 10/03/2019  Today's healthcare provider: Mar Daring, PA-C  Subjective:    Chief Complaint  Patient presents with  . Follow-up    T2DM   HPI  Diabetes Mellitus Type II, Follow-up:   Lab Results  Component Value Date   HGBA1C 6.6 (A) 10/03/2019   HGBA1C 6.7 (H) 06/25/2019   HGBA1C 6.4 (A) 06/22/2018    Last seen for diabetes 3 months ago.  Management since then includes Continue Metformin 500mg  BID. She reports excellent compliance with treatment. She is not having side effects. Current symptoms include none and have been stable. She reports swelling on in ankles. Home blood sugar records: not checking  Episodes of hypoglycemia? Not checking her sugar levels.   Current insulin regiment: Is not on insulin Most Recent Eye Exam: Pass Due Weight trend: stable Current exercise: none Current diet habits: in general, an "unhealthy" diet  Pertinent Labs:    Component Value Date/Time   CHOL 126 06/25/2019 1439   TRIG 144 06/25/2019 1439   HDL 34 (L) 06/25/2019 1439   LDLCALC 67 06/25/2019 1439   CREATININE 0.93 06/25/2019 1439   CREATININE 0.81 10/01/2014 0757    Wt Readings from Last 3 Encounters:  10/03/19 (!) 304 lb 3.2 oz (138 kg)  06/25/19 (!) 310 lb (140.6 kg)  06/22/18 (!) 312 lb 3.2 oz (141.6 kg)    ------------------------------------------------------------------------   Patient Active Problem List   Diagnosis Date Noted  . Screening for breast cancer 10/20/2017  . OSA (obstructive sleep apnea) 07/27/2017  . Axillary hidradenitis suppurativa 06/29/2016  . Vertigo 09/14/2015  . Chronic airway obstruction ([place]) 11/04/2014  . Avitaminosis D 11/04/2014  . Adiposity 11/04/2014  . Hypercholesteremia 11/04/2014  . Hemorrhagic cerebrovascular accident (CVA) ([place]) 11/04/2014  . Diabetes mellitus, type 2 ([place]) 10/01/2014  .  Essential (primary) hypertension 10/01/2014  . Thalamic hemorrhage ([place]) 10/01/2014   Past Medical History:  Diagnosis Date  . Asthma   . Brain aneurysm 04/2007  . Diabetes mellitus without complication ([place])    controlled;  . HLD (hyperlipidemia)   . Hypertension    controlled with medication  . Stroke ([place])   . Vertigo        Medications: Outpatient Medications Prior to Visit  Medication Sig  . amLODipine (NORVASC) 5 MG tablet Take 1 tablet (5 mg total) by mouth daily.  Marland Kitchen amoxicillin-clavulanate (AUGMENTIN) 875-125 MG tablet Take 1 tablet by mouth 2 (two) times daily.  Marland Kitchen atorvastatin (LIPITOR) 80 MG tablet Take 1 tablet (80 mg total) by mouth daily.  . benzonatate (TESSALON) 200 MG capsule Take 1 capsule (200 mg total) by mouth 2 (two) times daily as needed for cough.  . clopidogrel (PLAVIX) 75 MG tablet Take 1 tablet (75 mg total) by mouth daily.  . hydrochlorothiazide (HYDRODIURIL) 25 MG tablet Take 1 tablet (25 mg total) by mouth daily.  Marland Kitchen losartan (COZAAR) 100 MG tablet Take 1 tablet (100 mg total) by mouth daily.  . metFORMIN (GLUCOPHAGE) 500 MG tablet Take 1 tablet (500 mg total) by mouth 2 (two) times daily. With meal.   No facility-administered medications prior to visit.    Review of Systems  Constitutional: Negative.   Respiratory: Negative.   Cardiovascular: Negative.   Endocrine: Negative for polydipsia, polyphagia and polyuria.  Neurological: Negative.     Last hemoglobin A1c Lab Results  Component Value Date   HGBA1C 6.6 (A) 10/03/2019      Objective:    BP (!) 190/80 (BP Location: Left Arm, Patient Position: Sitting, Cuff Size: Large)   Pulse 83   Temp (!) 97.3 F (36.3 C) (Temporal)   Resp 16   Wt (!) 304 lb 3.2 oz (138 kg)   BMI 55.64 kg/m    Physical Exam Vitals reviewed.  Constitutional:      General: She is not in acute distress.    Appearance: Normal appearance. She is well-developed. She is obese. She is not ill-appearing or  diaphoretic.  HENT:     Head: Normocephalic and atraumatic.  Eyes:     General: No scleral icterus. Cardiovascular:     Rate and Rhythm: Normal rate and regular rhythm.     Heart sounds: Normal heart sounds. No murmur. No friction rub. No gallop.   Pulmonary:     Effort: Pulmonary effort is normal. No respiratory distress.     Breath sounds: Normal breath sounds. No wheezing or rales.  Musculoskeletal:     Cervical back: Normal range of motion and neck supple.  Neurological:     Mental Status: She is alert.       Results for orders placed or performed in visit on 10/03/19  POCT HgB A1C  Result Value Ref Range   Hemoglobin A1C 6.6 (A) 4.0 - 5.6 %   Est. average glucose Bld gHb Est-mCnc 143       Assessment & Plan:    1. Type 2 diabetes mellitus without complication, without long-term current use of insulin (HCC) A1c improved to 6.6. Continue metformin 500mg  BID. Return in 3 months.   2. Intertrigo Noted in right axilla. Lotrisone cream as below. Call if not improving.  - clotrimazole-betamethasone (LOTRISONE) cream; Apply 1 application topically 2 (two) times daily.  Dispense: 30 g; Refill: 0  3. Essential hypertension Elevated today. Add amlodipine 5mg . Continue losartan 100mg  and HCTZ 25mg  daily. Return in 3 months. Call if adverse effects occur.  - amLODipine (NORVASC) 5 MG tablet; Take 1 tablet (5 mg total) by mouth daily.  Dispense: 90 tablet; Refill: [place], [place] [phone] (phone) [phone] (fax)  [place]

## 2019-10-03 ENCOUNTER — Encounter: Payer: Self-pay | Admitting: Physician Assistant

## 2019-10-03 ENCOUNTER — Ambulatory Visit (INDEPENDENT_AMBULATORY_CARE_PROVIDER_SITE_OTHER): Payer: Medicare Other | Admitting: Physician Assistant

## 2019-10-03 ENCOUNTER — Other Ambulatory Visit: Payer: Self-pay

## 2019-10-03 VITALS — BP 190/80 | HR 83 | Temp 97.3°F | Resp 16 | Wt 304.2 lb

## 2019-10-03 DIAGNOSIS — E119 Type 2 diabetes mellitus without complications: Secondary | ICD-10-CM | POA: Diagnosis not present

## 2019-10-03 DIAGNOSIS — I10 Essential (primary) hypertension: Secondary | ICD-10-CM | POA: Diagnosis not present

## 2019-10-03 DIAGNOSIS — L304 Erythema intertrigo: Secondary | ICD-10-CM

## 2019-10-03 LAB — POCT GLYCOSYLATED HEMOGLOBIN (HGB A1C)
Est. average glucose Bld gHb Est-mCnc: 143
Hemoglobin A1C: 6.6 % — AB (ref 4.0–5.6)

## 2019-10-03 MED ORDER — CLOTRIMAZOLE-BETAMETHASONE 1-0.05 % EX CREA: 1.0000 "application " | TOPICAL_CREAM | Freq: Two times a day (BID) | CUTANEOUS | 0 refills | Status: DC

## 2019-10-03 MED ORDER — AMLODIPINE BESYLATE 5 MG PO TABS: 5.0000 mg | ORAL_TABLET | Freq: Every day | ORAL | 1 refills | Status: DC

## 2019-10-03 NOTE — Patient Instructions (Signed)
DASH Eating Plan DASH stands for "Dietary Approaches to Stop Hypertension." The DASH eating plan is a healthy eating plan that has been shown to reduce high blood pressure (hypertension). It may also reduce your risk for type 2 diabetes, heart disease, and stroke. The DASH eating plan may also help with weight loss. What are tips for following this plan?  General guidelines  Avoid eating more than 2,300 mg (milligrams) of salt (sodium) a day. If you have hypertension, you may need to reduce your sodium intake to 1,500 mg a day.  Limit alcohol intake to no more than 1 drink a day for nonpregnant women and 2 drinks a day for men. One drink equals 12 oz of beer, 5 oz of wine, or 1 oz of hard liquor.  Work with your health care provider to maintain a healthy body weight or to lose weight. Ask what an ideal weight is for you.  Get at least 30 minutes of exercise that causes your heart to beat faster (aerobic exercise) most days of the week. Activities may include walking, swimming, or biking.  Work with your health care provider or diet and nutrition specialist (dietitian) to adjust your eating plan to your individual calorie needs. Reading food labels   Check food labels for the amount of sodium per serving. Choose foods with less than 5 percent of the Daily Value of sodium. Generally, foods with less than 300 mg of sodium per serving fit into this eating plan.  To find whole grains, look for the word "whole" as the first word in the ingredient list. Shopping  Buy products labeled as "low-sodium" or "no salt added."  Buy fresh foods. Avoid canned foods and premade or frozen meals. Cooking  Avoid adding salt when cooking. Use salt-free seasonings or herbs instead of table salt or sea salt. Check with your health care provider or pharmacist before using salt substitutes.  Do not fry foods. Cook foods using healthy methods such as baking, boiling, grilling, and broiling instead.  Cook with  heart-healthy oils, such as olive, canola, soybean, or sunflower oil. Meal planning  Eat a balanced diet that includes: ? 5 or more servings of fruits and vegetables each day. At each meal, try to fill half of your plate with fruits and vegetables. ? Up to 6-8 servings of whole grains each day. ? Less than 6 oz of lean meat, poultry, or fish each day. A 3-oz serving of meat is about the same size as a deck of cards. One egg equals 1 oz. ? 2 servings of low-fat dairy each day. ? A serving of nuts, seeds, or beans 5 times each week. ? Heart-healthy fats. Healthy fats called Omega-3 fatty acids are found in foods such as flaxseeds and coldwater fish, like sardines, salmon, and mackerel.  Limit how much you eat of the following: ? Canned or prepackaged foods. ? Food that is high in trans fat, such as fried foods. ? Food that is high in saturated fat, such as fatty meat. ? Sweets, desserts, sugary drinks, and other foods with added sugar. ? Full-fat dairy products.  Do not salt foods before eating.  Try to eat at least 2 vegetarian meals each week.  Eat more home-cooked food and less restaurant, buffet, and fast food.  When eating at a restaurant, ask that your food be prepared with less salt or no salt, if possible. What foods are recommended? The items listed may not be a complete list. Talk with your dietitian about   what dietary choices are best for you. Grains Whole-grain or whole-wheat bread. Whole-grain or whole-wheat pasta. Brown rice. Oatmeal. Quinoa. Bulgur. Whole-grain and low-sodium cereals. Pita bread. Low-fat, low-sodium crackers. Whole-wheat flour tortillas. Vegetables Fresh or frozen vegetables (raw, steamed, roasted, or grilled). Low-sodium or reduced-sodium tomato and vegetable juice. Low-sodium or reduced-sodium tomato sauce and tomato paste. Low-sodium or reduced-sodium canned vegetables. Fruits All fresh, dried, or frozen fruit. Canned fruit in natural juice (without  added sugar). Meat and other protein foods Skinless chicken or turkey. Ground chicken or turkey. Pork with fat trimmed off. Fish and seafood. Egg whites. Dried beans, peas, or lentils. Unsalted nuts, nut butters, and seeds. Unsalted canned beans. Lean cuts of beef with fat trimmed off. Low-sodium, lean deli meat. Dairy Low-fat (1%) or fat-free (skim) milk. Fat-free, low-fat, or reduced-fat cheeses. Nonfat, low-sodium ricotta or cottage cheese. Low-fat or nonfat yogurt. Low-fat, low-sodium cheese. Fats and oils Soft margarine without trans fats. Vegetable oil. Low-fat, reduced-fat, or light mayonnaise and salad dressings (reduced-sodium). Canola, safflower, olive, soybean, and sunflower oils. Avocado. Seasoning and other foods Herbs. Spices. Seasoning mixes without salt. Unsalted popcorn and pretzels. Fat-free sweets. What foods are not recommended? The items listed may not be a complete list. Talk with your dietitian about what dietary choices are best for you. Grains Baked goods made with fat, such as croissants, muffins, or some breads. Dry pasta or rice meal packs. Vegetables Creamed or fried vegetables. Vegetables in a cheese sauce. Regular canned vegetables (not low-sodium or reduced-sodium). Regular canned tomato sauce and paste (not low-sodium or reduced-sodium). Regular tomato and vegetable juice (not low-sodium or reduced-sodium). Pickles. Olives. Fruits Canned fruit in a light or heavy syrup. Fried fruit. Fruit in cream or butter sauce. Meat and other protein foods Fatty cuts of meat. Ribs. Fried meat. Bacon. Sausage. Bologna and other processed lunch meats. Salami. Fatback. Hotdogs. Bratwurst. Salted nuts and seeds. Canned beans with added salt. Canned or smoked fish. Whole eggs or egg yolks. Chicken or turkey with skin. Dairy Whole or 2% milk, cream, and half-and-half. Whole or full-fat cream cheese. Whole-fat or sweetened yogurt. Full-fat cheese. Nondairy creamers. Whipped toppings.  Processed cheese and cheese spreads. Fats and oils Butter. Stick margarine. Lard. Shortening. Ghee. Bacon fat. Tropical oils, such as coconut, palm kernel, or palm oil. Seasoning and other foods Salted popcorn and pretzels. Onion salt, garlic salt, seasoned salt, table salt, and sea salt. Worcestershire sauce. Tartar sauce. Barbecue sauce. Teriyaki sauce. Soy sauce, including reduced-sodium. Steak sauce. Canned and packaged gravies. Fish sauce. Oyster sauce. Cocktail sauce. Horseradish that you find on the shelf. Ketchup. Mustard. Meat flavorings and tenderizers. Bouillon cubes. Hot sauce and Tabasco sauce. Premade or packaged marinades. Premade or packaged taco seasonings. Relishes. Regular salad dressings. Where to find more information:  National Heart, Lung, and Blood Institute: www.nhlbi.nih.gov  American Heart Association: www.heart.org Summary  The DASH eating plan is a healthy eating plan that has been shown to reduce high blood pressure (hypertension). It may also reduce your risk for type 2 diabetes, heart disease, and stroke.  With the DASH eating plan, you should limit salt (sodium) intake to 2,300 mg a day. If you have hypertension, you may need to reduce your sodium intake to 1,500 mg a day.  When on the DASH eating plan, aim to eat more fresh fruits and vegetables, whole grains, lean proteins, low-fat dairy, and heart-healthy fats.  Work with your health care provider or diet and nutrition specialist (dietitian) to adjust your eating plan to your   individual calorie needs. This information is not intended to replace advice given to you by your health care provider. Make sure you discuss any questions you have with your health care provider. Document Revised: 05/26/2017 Document Reviewed: 06/06/2016 Elsevier Patient Education  2020 Elsevier Inc.  

## 2020-01-02 ENCOUNTER — Ambulatory Visit: Payer: Self-pay | Admitting: Physician Assistant

## 2020-01-09 ENCOUNTER — Ambulatory Visit (INDEPENDENT_AMBULATORY_CARE_PROVIDER_SITE_OTHER): Payer: Medicare Other | Admitting: Physician Assistant

## 2020-01-09 ENCOUNTER — Encounter: Payer: Self-pay | Admitting: Physician Assistant

## 2020-01-09 ENCOUNTER — Other Ambulatory Visit: Payer: Self-pay

## 2020-01-09 VITALS — BP 174/96 | HR 71 | Temp 97.5°F | Resp 16 | Ht 62.0 in | Wt 303.2 lb

## 2020-01-09 DIAGNOSIS — Z6841 Body Mass Index (BMI) 40.0 and over, adult: Secondary | ICD-10-CM | POA: Diagnosis not present

## 2020-01-09 DIAGNOSIS — I10 Essential (primary) hypertension: Secondary | ICD-10-CM

## 2020-01-09 DIAGNOSIS — E119 Type 2 diabetes mellitus without complications: Secondary | ICD-10-CM | POA: Diagnosis not present

## 2020-01-09 LAB — POCT GLYCOSYLATED HEMOGLOBIN (HGB A1C)
Est. average glucose Bld gHb Est-mCnc: 146
Hemoglobin A1C: 6.7 % — AB (ref 4.0–5.6)

## 2020-01-09 MED ORDER — AMLODIPINE BESYLATE 10 MG PO TABS: 10.0000 mg | ORAL_TABLET | Freq: Every day | ORAL | 3 refills | Status: DC

## 2020-01-09 MED ORDER — METFORMIN HCL 500 MG PO TABS: 500.0000 mg | ORAL_TABLET | Freq: Two times a day (BID) | ORAL | 1 refills | Status: DC

## 2020-01-09 MED ORDER — LOSARTAN POTASSIUM-HCTZ 100-25 MG PO TABS: 1.0000 | ORAL_TABLET | Freq: Every day | ORAL | 3 refills | Status: DC

## 2020-01-09 NOTE — Progress Notes (Signed)
Established patient visit   Patient: Kristin Mora   DOB: 1956-04-18   64 y.o. Female  MRN: [id number] Visit Date: 01/09/2020  Today's healthcare provider: Mar Daring, PA-C   Chief Complaint  Patient presents with  . Follow-up   Subjective    HPI  Diabetes Mellitus Type II, Follow-up  Lab Results  Component Value Date   HGBA1C 6.7 (A) 01/09/2020   HGBA1C 6.6 (A) 10/03/2019   HGBA1C 6.7 (H) 06/25/2019   Wt Readings from Last 3 Encounters:  01/09/20 (!) 303 lb 3.2 oz (137.5 kg)  10/03/19 (!) 304 lb 3.2 oz (138 kg)  06/25/19 (!) 310 lb (140.6 kg)   Last seen for diabetes 3 months ago.  Management since then includes Continue metformin 500mg  BID. She reports excellent compliance with treatment. She is not having side effects.  Symptoms: No fatigue No foot ulcerations  No appetite changes No nausea  No paresthesia of the feet  No polydipsia  No polyuria No visual disturbances   No vomiting     Home blood sugar records: fasting range: 90's  Episodes of hypoglycemia? No none   Current insulin regiment: none Most Recent Eye Exam: not UTD Current exercise: none Current diet habits: not asked  Pertinent Labs: Lab Results  Component Value Date   CHOL 126 06/25/2019   HDL 34 (L) 06/25/2019   LDLCALC 67 06/25/2019   TRIG 144 06/25/2019   CHOLHDL 4.4 10/20/2017   Lab Results  Component Value Date   NA 143 06/25/2019   K 3.9 06/25/2019   CREATININE 0.93 06/25/2019   GFRNONAA 65 06/25/2019   GFRAA 75 06/25/2019   GLUCOSE 116 (H) 06/25/2019     --------------------------------------------------------------------------------------------------- Hypertension, follow-up  BP Readings from Last 3 Encounters:  01/09/20 (!) 174/96  10/03/19 (!) 190/80  06/25/19 (!) 147/83   Wt Readings from Last 3 Encounters:  01/09/20 (!) 303 lb 3.2 oz (137.5 kg)  10/03/19 (!) 304 lb 3.2 oz (138 kg)  06/25/19 (!) 310 lb (140.6 kg)     She was last seen for  hypertension 3 months ago.  BP at that visit was 190/80. Management since that visit includes Add amlodipine 5mg . Continue losartan 100mg  and HCTZ 25mg  daily.  She reports excellent compliance with treatment. She is not having side effects.  She does not smoke.  Outside blood pressures are not being checked. Symptoms: No chest pain No chest pressure  No palpitations No syncope  No dyspnea No orthopnea  No paroxysmal nocturnal dyspnea Yes lower extremity edema    The ASCVD Risk score Mikey Bussing DC Jr., et al., 2013) failed to calculate for the following reasons:   The valid total cholesterol range is 130 to 320 mg/dL   --------------------------------------------------------------------------------------------------- Patient Active Problem List   Diagnosis Date Noted  . Screening for breast cancer 10/20/2017  . OSA (obstructive sleep apnea) 07/27/2017  . Axillary hidradenitis suppurativa 06/29/2016  . Vertigo 09/14/2015  . Chronic airway obstruction ([place]) 11/04/2014  . Avitaminosis D 11/04/2014  . Adiposity 11/04/2014  . Hypercholesteremia 11/04/2014  . Hemorrhagic cerebrovascular accident (CVA) ([place]) 11/04/2014  . Diabetes mellitus, type 2 ([place]) 10/01/2014  . Essential (primary) hypertension 10/01/2014  . Thalamic hemorrhage ([place]) 10/01/2014   Past Medical History:  Diagnosis Date  . Asthma   . Brain aneurysm 04/2007  . Diabetes mellitus without complication ([place])    controlled;  . HLD (hyperlipidemia)   . Hypertension    controlled with medication  . Stroke ([place])   .  Vertigo        Medications: Outpatient Medications Prior to Visit  Medication Sig  . amLODipine (NORVASC) 5 MG tablet Take 1 tablet (5 mg total) by mouth daily.  Marland Kitchen atorvastatin (LIPITOR) 80 MG tablet Take 1 tablet (80 mg total) by mouth daily.  . clopidogrel (PLAVIX) 75 MG tablet Take 1 tablet (75 mg total) by mouth daily.  . clotrimazole-betamethasone (LOTRISONE) cream Apply 1 application topically 2  (two) times daily.  . hydrochlorothiazide (HYDRODIURIL) 25 MG tablet Take 1 tablet (25 mg total) by mouth daily.  Marland Kitchen losartan (COZAAR) 100 MG tablet Take 1 tablet (100 mg total) by mouth daily.  . metFORMIN (GLUCOPHAGE) 500 MG tablet Take 1 tablet (500 mg total) by mouth 2 (two) times daily. With meal.   No facility-administered medications prior to visit.    Review of Systems  Constitutional: Negative for fatigue and fever.  Eyes: Negative for visual disturbance.  Respiratory: Negative for cough, chest tightness and shortness of breath.   Cardiovascular: Negative for chest pain and palpitations.  Gastrointestinal: Negative for abdominal pain, nausea and vomiting.    Last CBC Lab Results  Component Value Date   WBC 12.0 (H) 06/25/2019   HGB 14.6 06/25/2019   HCT 45.2 06/25/2019   MCV 88 06/25/2019   MCH 28.3 06/25/2019   RDW 14.7 06/25/2019   PLT 257 04/02/1218   Last metabolic panel Lab Results  Component Value Date   GLUCOSE 116 (H) 06/25/2019   NA 143 06/25/2019   K 3.9 06/25/2019   CL 102 06/25/2019   CO2 28 06/25/2019   BUN 18 06/25/2019   CREATININE 0.93 06/25/2019   GFRNONAA 65 06/25/2019   GFRAA 75 06/25/2019   CALCIUM 9.0 06/25/2019   PROT 7.2 06/25/2019   ALBUMIN 3.7 (L) 06/25/2019   LABGLOB 3.5 06/25/2019   AGRATIO 1.1 (L) 06/25/2019   BILITOT 0.7 06/25/2019   ALKPHOS 73 06/25/2019   AST 20 06/25/2019   ALT 21 06/25/2019   ANIONGAP 8 04/03/2017      Objective    BP (!) 174/96 (BP Location: Left Wrist, Patient Position: Sitting, Cuff Size: Normal)   Pulse 71   Temp (!) 97.5 F (36.4 C) (Temporal)   Resp 16   Ht 5\' 2"  (1.575 m)   Wt (!) 303 lb 3.2 oz (137.5 kg)   BMI 55.46 kg/m  BP Readings from Last 3 Encounters:  01/09/20 (!) 174/96  10/03/19 (!) 190/80  06/25/19 (!) 147/83   Wt Readings from Last 3 Encounters:  01/09/20 (!) 303 lb 3.2 oz (137.5 kg)  10/03/19 (!) 304 lb 3.2 oz (138 kg)  06/25/19 (!) 310 lb (140.6 kg)      Physical  Exam Vitals reviewed.  Constitutional:      General: She is not in acute distress.    Appearance: Normal appearance. She is well-developed. She is obese. She is not ill-appearing or diaphoretic.  Cardiovascular:     Rate and Rhythm: Normal rate and regular rhythm.     Heart sounds: Normal heart sounds. No murmur heard.  No friction rub. No gallop.   Pulmonary:     Effort: Pulmonary effort is normal. No respiratory distress.     Breath sounds: Normal breath sounds. No wheezing or rales.  Musculoskeletal:     Cervical back: Normal range of motion and neck supple.  Neurological:     Mental Status: She is alert.       Results for orders placed or performed in visit on  01/09/20  POCT glycosylated hemoglobin (Hb A1C)  Result Value Ref Range   Hemoglobin A1C 6.7 (A) 4.0 - 5.6 %   Est. average glucose Bld gHb Est-mCnc 146     Assessment & Plan     1. Type 2 diabetes mellitus without complication, without long-term current use of insulin (HCC) A1c stable. Continue Metformin 500mg  BID. Recheck in 3 months. - metFORMIN (GLUCOPHAGE) 500 MG tablet; Take 1 tablet (500 mg total) by mouth 2 (two) times daily. With meal.  Dispense: 180 tablet; Refill: 1  2. Class 3 severe obesity due to excess calories with serious comorbidity and body mass index (BMI) of 50.0 to 59.9 in adult [facility]) Counseled patient on healthy lifestyle modifications including dieting and exercise.   3. Essential (primary) hypertension Changing losartan-HCTZ back to combination pill as below. Increase amlodipine to 10mg  from 5mg . Will monitor leg swelling with increased dose. F/U in 3 months. - losartan-hydrochlorothiazide (HYZAAR) 100-25 MG tablet; Take 1 tablet by mouth daily.  Dispense: 90 tablet; Refill: 3 - amLODipine (NORVASC) 10 MG tablet; Take 1 tablet (10 mg total) by mouth daily.  Dispense: 90 tablet; Refill: 3   No follow-ups on file.      Reynolds Bowl, PA-C, have reviewed all documentation for this  visit. The documentation on 01/09/20 for the exam, diagnosis, procedures, and orders are all accurate and complete.   Rubye Beach  [facility] [phone] (phone) [phone] (fax)  [place]

## 2020-01-09 NOTE — Patient Instructions (Addendum)
Start Losartan-HCTZ combination pill. These are the same medications you are taking we are just combining them into the one pill. Stop Amlodipine 5mg . Start Amlodipine 10mg . Continue Metformin 500mg  twice daily. Sugar is doing well!  DASH Eating Plan DASH stands for "Dietary Approaches to Stop Hypertension." The DASH eating plan is a healthy eating plan that has been shown to reduce high blood pressure (hypertension). It may also reduce your risk for type 2 diabetes, heart disease, and stroke. The DASH eating plan may also help with weight loss. What are tips for following this plan?  General guidelines  Avoid eating more than 2,300 mg (milligrams) of salt (sodium) a day. If you have hypertension, you may need to reduce your sodium intake to 1,500 mg a day.  Limit alcohol intake to no more than 1 drink a day for nonpregnant women and 2 drinks a day for men. One drink equals 12 oz of beer, 5 oz of wine, or 1 oz of hard liquor.  Work with your health care provider to maintain a healthy body weight or to lose weight. Ask what an ideal weight is for you.  Get at least 30 minutes of exercise that causes your heart to beat faster (aerobic exercise) most days of the week. Activities may include walking, swimming, or biking.  Work with your health care provider or diet and nutrition specialist (dietitian) to adjust your eating plan to your individual calorie needs. Reading food labels   Check food labels for the amount of sodium per serving. Choose foods with less than 5 percent of the Daily Value of sodium. Generally, foods with less than 300 mg of sodium per serving fit into this eating plan.  To find whole grains, look for the word "whole" as the first word in the ingredient list. Shopping  Buy products labeled as "low-sodium" or "no salt added."  Buy fresh foods. Avoid canned foods and premade or frozen meals. Cooking  Avoid adding salt when cooking. Use salt-free seasonings or herbs  instead of table salt or sea salt. Check with your health care provider or pharmacist before using salt substitutes.  Do not fry foods. Cook foods using healthy methods such as baking, boiling, grilling, and broiling instead.  Cook with heart-healthy oils, such as olive, canola, soybean, or sunflower oil. Meal planning  Eat a balanced diet that includes: ? 5 or more servings of fruits and vegetables each day. At each meal, try to fill half of your plate with fruits and vegetables. ? Up to 6-8 servings of whole grains each day. ? Less than 6 oz of lean meat, poultry, or fish each day. A 3-oz serving of meat is about the same size as a deck of cards. One egg equals 1 oz. ? 2 servings of low-fat dairy each day. ? A serving of nuts, seeds, or beans 5 times each week. ? Heart-healthy fats. Healthy fats called Omega-3 fatty acids are found in foods such as flaxseeds and coldwater fish, like sardines, salmon, and mackerel.  Limit how much you eat of the following: ? Canned or prepackaged foods. ? Food that is high in trans fat, such as fried foods. ? Food that is high in saturated fat, such as fatty meat. ? Sweets, desserts, sugary drinks, and other foods with added sugar. ? Full-fat dairy products.  Do not salt foods before eating.  Try to eat at least 2 vegetarian meals each week.  Eat more home-cooked food and less restaurant, buffet, and fast food.  When eating at a restaurant, ask that your food be prepared with less salt or no salt, if possible. What foods are recommended? The items listed may not be a complete list. Talk with your dietitian about what dietary choices are best for you. Grains Whole-grain or whole-wheat bread. Whole-grain or whole-wheat pasta. Brown rice. [place]. Bulgur. Whole-grain and low-sodium cereals. Pita bread. Low-fat, low-sodium crackers. Whole-wheat flour tortillas. Vegetables Fresh or frozen vegetables (raw, steamed, roasted, or grilled).  Low-sodium or reduced-sodium tomato and vegetable juice. Low-sodium or reduced-sodium tomato sauce and tomato paste. Low-sodium or reduced-sodium canned vegetables. Fruits All fresh, dried, or frozen fruit. Canned fruit in natural juice (without added sugar). Meat and other protein foods Skinless chicken or [place]. Ground chicken or [place]. Pork with fat trimmed off. Fish and seafood. Egg whites. Dried beans, peas, or lentils. Unsalted nuts, nut butters, and seeds. Unsalted canned beans. Lean cuts of beef with fat trimmed off. Low-sodium, lean deli meat. Dairy Low-fat (1%) or fat-free (skim) milk. Fat-free, low-fat, or reduced-fat cheeses. Nonfat, low-sodium ricotta or cottage cheese. Low-fat or nonfat yogurt. Low-fat, low-sodium cheese. Fats and oils Soft margarine without trans fats. Vegetable oil. Low-fat, reduced-fat, or light mayonnaise and salad dressings (reduced-sodium). Canola, safflower, olive, soybean, and sunflower oils. Avocado. Seasoning and other foods Herbs. Spices. Seasoning mixes without salt. Unsalted popcorn and pretzels. Fat-free sweets. What foods are not recommended? The items listed may not be a complete list. Talk with your dietitian about what dietary choices are best for you. Grains Baked goods made with fat, such as croissants, muffins, or some breads. Dry pasta or rice meal packs. Vegetables Creamed or fried vegetables. Vegetables in a cheese sauce. Regular canned vegetables (not low-sodium or reduced-sodium). Regular canned tomato sauce and paste (not low-sodium or reduced-sodium). Regular tomato and vegetable juice (not low-sodium or reduced-sodium). Angie Fava. Olives. Fruits Canned fruit in a light or heavy syrup. Fried fruit. Fruit in cream or butter sauce. Meat and other protein foods Fatty cuts of meat. Ribs. Fried meat. Berniece Salines. Sausage. Bologna and other processed lunch meats. Salami. Fatback. Hotdogs. Bratwurst. Salted nuts and seeds. Canned beans with added  salt. Canned or smoked fish. Whole eggs or egg yolks. Chicken or [place] with skin. Dairy Whole or 2% milk, cream, and half-and-half. Whole or full-fat cream cheese. Whole-fat or sweetened yogurt. Full-fat cheese. Nondairy creamers. Whipped toppings. Processed cheese and cheese spreads. Fats and oils Butter. Stick margarine. Lard. Shortening. Ghee. Bacon fat. Tropical oils, such as coconut, palm kernel, or palm oil. Seasoning and other foods Salted popcorn and pretzels. Onion salt, garlic salt, seasoned salt, table salt, and sea salt. Worcestershire sauce. Tartar sauce. Barbecue sauce. Teriyaki sauce. Soy sauce, including reduced-sodium. Steak sauce. Canned and packaged gravies. Fish sauce. Oyster sauce. Cocktail sauce. Horseradish that you find on the shelf. Ketchup. Mustard. Meat flavorings and tenderizers. Bouillon cubes. Hot sauce and Tabasco sauce. Premade or packaged marinades. Premade or packaged taco seasonings. Relishes. Regular salad dressings. Where to find more information:  National Heart, Lung, and [place]: https://wilson-eaton.com/  American Heart Association: www.heart.org Summary  The DASH eating plan is a healthy eating plan that has been shown to reduce high blood pressure (hypertension). It may also reduce your risk for type 2 diabetes, heart disease, and stroke.  With the DASH eating plan, you should limit salt (sodium) intake to 2,300 mg a day. If you have hypertension, you may need to reduce your sodium intake to 1,500 mg a day.  When on the DASH eating plan,  aim to eat more fresh fruits and vegetables, whole grains, lean proteins, low-fat dairy, and heart-healthy fats.  Work with your health care provider or diet and nutrition specialist (dietitian) to adjust your eating plan to your individual calorie needs. This information is not intended to replace advice given to you by your health care provider. Make sure you discuss any questions you have with your health care  provider. Document Revised: 05/26/2017 Document Reviewed: 06/06/2016 Elsevier Patient Education  2020 [employer].

## 2020-04-04 ENCOUNTER — Other Ambulatory Visit: Payer: Self-pay | Admitting: Physician Assistant

## 2020-04-04 DIAGNOSIS — L304 Erythema intertrigo: Secondary | ICD-10-CM

## 2020-04-04 NOTE — Telephone Encounter (Signed)
Requested medication (s) are due for refill today: yes  Requested medication (s) are on the active medication list: yes  Last refill:  10/03/19  Future visit scheduled: yes  Notes to clinic:  med not assigned to a protocol   Requested Prescriptions  Pending Prescriptions Disp Refills   clotrimazole-betamethasone (LOTRISONE) cream [Pharmacy Med Name: Clotrimazole-Betamethasone 1-0.05 % External Cream] 30 g 0    Sig: APPLY  CREAM TOPICALLY TWICE DAILY      Off-Protocol Failed - 04/04/2020  1:01 PM      Failed - Medication not assigned to a protocol, review manually.      Passed - Valid encounter within last 12 months    Recent Outpatient Visits           2 months ago Type 2 diabetes mellitus without complication, without long-term current use of insulin [facility])   [facility] [place], [place], [place]   6 months ago Type 2 diabetes mellitus without complication, without long-term current use of insulin [facility])   [place], [place], [place]   7 months ago Acute non-recurrent pansinusitis   [facility] [place], Clearnce Sorrel, [place]   9 months ago Medicare annual wellness visit, subsequent   [place], [place], [place]   1 year ago Acute conjunctivitis of right eye, unspecified acute conjunctivitis type   [place], Vickki Muff, [place]       Future Appointments             In 6 days [place], Clearnce Sorrel, PA-C [employer], [place]

## 2020-04-10 ENCOUNTER — Other Ambulatory Visit: Payer: Self-pay

## 2020-04-10 ENCOUNTER — Encounter: Payer: Self-pay | Admitting: Physician Assistant

## 2020-04-10 ENCOUNTER — Ambulatory Visit (INDEPENDENT_AMBULATORY_CARE_PROVIDER_SITE_OTHER): Payer: Medicare Other | Admitting: Physician Assistant

## 2020-04-10 VITALS — BP 134/72 | HR 76 | Temp 98.3°F | Resp 16 | Wt 305.0 lb

## 2020-04-10 DIAGNOSIS — Z23 Encounter for immunization: Secondary | ICD-10-CM

## 2020-04-10 DIAGNOSIS — E119 Type 2 diabetes mellitus without complications: Secondary | ICD-10-CM

## 2020-04-10 DIAGNOSIS — I10 Essential (primary) hypertension: Secondary | ICD-10-CM

## 2020-04-10 LAB — POCT GLYCOSYLATED HEMOGLOBIN (HGB A1C)
Est. average glucose Bld gHb Est-mCnc: 146
Hemoglobin A1C: 6.7 % — AB (ref 4.0–5.6)

## 2020-04-10 NOTE — Patient Instructions (Signed)
DASH Eating Plan DASH stands for "Dietary Approaches to Stop Hypertension." The DASH eating plan is a healthy eating plan that has been shown to reduce high blood pressure (hypertension). It may also reduce your risk for type 2 diabetes, heart disease, and stroke. The DASH eating plan may also help with weight loss. What are tips for following this plan?  General guidelines  Avoid eating more than 2,300 mg (milligrams) of salt (sodium) a day. If you have hypertension, you may need to reduce your sodium intake to 1,500 mg a day.  Limit alcohol intake to no more than 1 drink a day for nonpregnant women and 2 drinks a day for men. One drink equals 12 oz of beer, 5 oz of wine, or 1 oz of hard liquor.  Work with your health care provider to maintain a healthy body weight or to lose weight. Ask what an ideal weight is for you.  Get at least 30 minutes of exercise that causes your heart to beat faster (aerobic exercise) most days of the week. Activities may include walking, swimming, or biking.  Work with your health care provider or diet and nutrition specialist (dietitian) to adjust your eating plan to your individual calorie needs. Reading food labels   Check food labels for the amount of sodium per serving. Choose foods with less than 5 percent of the Daily Value of sodium. Generally, foods with less than 300 mg of sodium per serving fit into this eating plan.  To find whole grains, look for the word "whole" as the first word in the ingredient list. Shopping  Buy products labeled as "low-sodium" or "no salt added."  Buy fresh foods. Avoid canned foods and premade or frozen meals. Cooking  Avoid adding salt when cooking. Use salt-free seasonings or herbs instead of table salt or sea salt. Check with your health care provider or pharmacist before using salt substitutes.  Do not fry foods. Cook foods using healthy methods such as baking, boiling, grilling, and broiling instead.  Cook with  heart-healthy oils, such as olive, canola, soybean, or sunflower oil. Meal planning  Eat a balanced diet that includes: ? 5 or more servings of fruits and vegetables each day. At each meal, try to fill half of your plate with fruits and vegetables. ? Up to 6-8 servings of whole grains each day. ? Less than 6 oz of lean meat, poultry, or fish each day. A 3-oz serving of meat is about the same size as a deck of cards. One egg equals 1 oz. ? 2 servings of low-fat dairy each day. ? A serving of nuts, seeds, or beans 5 times each week. ? Heart-healthy fats. Healthy fats called Omega-3 fatty acids are found in foods such as flaxseeds and coldwater fish, like sardines, salmon, and mackerel.  Limit how much you eat of the following: ? Canned or prepackaged foods. ? Food that is high in trans fat, such as fried foods. ? Food that is high in saturated fat, such as fatty meat. ? Sweets, desserts, sugary drinks, and other foods with added sugar. ? Full-fat dairy products.  Do not salt foods before eating.  Try to eat at least 2 vegetarian meals each week.  Eat more home-cooked food and less restaurant, buffet, and fast food.  When eating at a restaurant, ask that your food be prepared with less salt or no salt, if possible. What foods are recommended? The items listed may not be a complete list. Talk with your dietitian about   what dietary choices are best for you. Grains Whole-grain or whole-wheat bread. Whole-grain or whole-wheat pasta. Brown rice. Oatmeal. Quinoa. Bulgur. Whole-grain and low-sodium cereals. Pita bread. Low-fat, low-sodium crackers. Whole-wheat flour tortillas. Vegetables Fresh or frozen vegetables (raw, steamed, roasted, or grilled). Low-sodium or reduced-sodium tomato and vegetable juice. Low-sodium or reduced-sodium tomato sauce and tomato paste. Low-sodium or reduced-sodium canned vegetables. Fruits All fresh, dried, or frozen fruit. Canned fruit in natural juice (without  added sugar). Meat and other protein foods Skinless chicken or turkey. Ground chicken or turkey. Pork with fat trimmed off. Fish and seafood. Egg whites. Dried beans, peas, or lentils. Unsalted nuts, nut butters, and seeds. Unsalted canned beans. Lean cuts of beef with fat trimmed off. Low-sodium, lean deli meat. Dairy Low-fat (1%) or fat-free (skim) milk. Fat-free, low-fat, or reduced-fat cheeses. Nonfat, low-sodium ricotta or cottage cheese. Low-fat or nonfat yogurt. Low-fat, low-sodium cheese. Fats and oils Soft margarine without trans fats. Vegetable oil. Low-fat, reduced-fat, or light mayonnaise and salad dressings (reduced-sodium). Canola, safflower, olive, soybean, and sunflower oils. Avocado. Seasoning and other foods Herbs. Spices. Seasoning mixes without salt. Unsalted popcorn and pretzels. Fat-free sweets. What foods are not recommended? The items listed may not be a complete list. Talk with your dietitian about what dietary choices are best for you. Grains Baked goods made with fat, such as croissants, muffins, or some breads. Dry pasta or rice meal packs. Vegetables Creamed or fried vegetables. Vegetables in a cheese sauce. Regular canned vegetables (not low-sodium or reduced-sodium). Regular canned tomato sauce and paste (not low-sodium or reduced-sodium). Regular tomato and vegetable juice (not low-sodium or reduced-sodium). Pickles. Olives. Fruits Canned fruit in a light or heavy syrup. Fried fruit. Fruit in cream or butter sauce. Meat and other protein foods Fatty cuts of meat. Ribs. Fried meat. Bacon. Sausage. Bologna and other processed lunch meats. Salami. Fatback. Hotdogs. Bratwurst. Salted nuts and seeds. Canned beans with added salt. Canned or smoked fish. Whole eggs or egg yolks. Chicken or turkey with skin. Dairy Whole or 2% milk, cream, and half-and-half. Whole or full-fat cream cheese. Whole-fat or sweetened yogurt. Full-fat cheese. Nondairy creamers. Whipped toppings.  Processed cheese and cheese spreads. Fats and oils Butter. Stick margarine. Lard. Shortening. Ghee. Bacon fat. Tropical oils, such as coconut, palm kernel, or palm oil. Seasoning and other foods Salted popcorn and pretzels. Onion salt, garlic salt, seasoned salt, table salt, and sea salt. Worcestershire sauce. Tartar sauce. Barbecue sauce. Teriyaki sauce. Soy sauce, including reduced-sodium. Steak sauce. Canned and packaged gravies. Fish sauce. Oyster sauce. Cocktail sauce. Horseradish that you find on the shelf. Ketchup. Mustard. Meat flavorings and tenderizers. Bouillon cubes. Hot sauce and Tabasco sauce. Premade or packaged marinades. Premade or packaged taco seasonings. Relishes. Regular salad dressings. Where to find more information:  National Heart, Lung, and Blood Institute: www.nhlbi.nih.gov  American Heart Association: www.heart.org Summary  The DASH eating plan is a healthy eating plan that has been shown to reduce high blood pressure (hypertension). It may also reduce your risk for type 2 diabetes, heart disease, and stroke.  With the DASH eating plan, you should limit salt (sodium) intake to 2,300 mg a day. If you have hypertension, you may need to reduce your sodium intake to 1,500 mg a day.  When on the DASH eating plan, aim to eat more fresh fruits and vegetables, whole grains, lean proteins, low-fat dairy, and heart-healthy fats.  Work with your health care provider or diet and nutrition specialist (dietitian) to adjust your eating plan to your   individual calorie needs. This information is not intended to replace advice given to you by your health care provider. Make sure you discuss any questions you have with your health care provider. Document Revised: 05/26/2017 Document Reviewed: 06/06/2016 Elsevier Patient Education  2020 Elsevier Inc.  

## 2020-04-10 NOTE — Progress Notes (Signed)
Established patient visit   Patient: Kristin Mora   DOB: Sep 13, 1955   64 y.o. Female  MRN: [id number] Visit Date: 04/10/2020  Today's healthcare provider: Mar Daring, PA-C   Chief Complaint  Patient presents with  . Follow-up   Subjective    HPI  Patient here for 3 months follow up T2DM and Essential Hypertension. T2DM: Patient was advised in last office visit to continue Metformin 500 mg BID. Patient reports good compliance with treatment.  HTN: Changed her Losartan-HCTZ back to combination pill and was advised to increase Amlodipine to 10 mg from 5 mg. She is having some swelling on her ankles.  Patient Active Problem List   Diagnosis Date Noted  . Screening for breast cancer 10/20/2017  . OSA (obstructive sleep apnea) 07/27/2017  . Axillary hidradenitis suppurativa 06/29/2016  . Vertigo 09/14/2015  . Chronic airway obstruction ([place]) 11/04/2014  . Avitaminosis D 11/04/2014  . Adiposity 11/04/2014  . Hypercholesteremia 11/04/2014  . Hemorrhagic cerebrovascular accident (CVA) ([place]) 11/04/2014  . Diabetes mellitus, type 2 ([place]) 10/01/2014  . Essential (primary) hypertension 10/01/2014  . Thalamic hemorrhage ([place]) 10/01/2014   Past Medical History:  Diagnosis Date  . Asthma   . Brain aneurysm 04/2007  . Diabetes mellitus without complication ([place])    controlled;  . HLD (hyperlipidemia)   . Hypertension    controlled with medication  . Stroke ([place])   . Vertigo        Medications: Outpatient Medications Prior to Visit  Medication Sig  . amLODipine (NORVASC) 10 MG tablet Take 1 tablet (10 mg total) by mouth daily.  Marland Kitchen atorvastatin (LIPITOR) 80 MG tablet Take 1 tablet (80 mg total) by mouth daily.  . clopidogrel (PLAVIX) 75 MG tablet Take 1 tablet (75 mg total) by mouth daily.  . clotrimazole-betamethasone (LOTRISONE) cream APPLY  CREAM TOPICALLY TWICE DAILY  . losartan-hydrochlorothiazide (HYZAAR) 100-25 MG tablet Take 1 tablet by mouth daily.  .  metFORMIN (GLUCOPHAGE) 500 MG tablet Take 1 tablet (500 mg total) by mouth 2 (two) times daily. With meal.   No facility-administered medications prior to visit.    Review of Systems  Constitutional: Negative.   Respiratory: Negative.   Cardiovascular: Positive for leg swelling. Negative for chest pain and palpitations.  Neurological: Negative.     Last CBC Lab Results  Component Value Date   WBC 12.0 (H) 06/25/2019   HGB 14.6 06/25/2019   HCT 45.2 06/25/2019   MCV 88 06/25/2019   MCH 28.3 06/25/2019   RDW 14.7 06/25/2019   PLT 257 98/33/8250   Last metabolic panel Lab Results  Component Value Date   GLUCOSE 116 (H) 06/25/2019   NA 143 06/25/2019   K 3.9 06/25/2019   CL 102 06/25/2019   CO2 28 06/25/2019   BUN 18 06/25/2019   CREATININE 0.93 06/25/2019   GFRNONAA 65 06/25/2019   GFRAA 75 06/25/2019   CALCIUM 9.0 06/25/2019   PROT 7.2 06/25/2019   ALBUMIN 3.7 (L) 06/25/2019   LABGLOB 3.5 06/25/2019   AGRATIO 1.1 (L) 06/25/2019   BILITOT 0.7 06/25/2019   ALKPHOS 73 06/25/2019   AST 20 06/25/2019   ALT 21 06/25/2019   ANIONGAP 8 04/03/2017      Objective    BP 134/72 (BP Location: Left Wrist, Patient Position: Sitting, Cuff Size: Large)   Pulse 76   Temp 98.3 F (36.8 C) (Oral)   Resp 16   Wt (!) 305 lb (138.3 kg)   BMI 55.79 kg/m  BP Readings from Last 3 Encounters:  04/10/20 134/72  01/09/20 (!) 174/96  10/03/19 (!) 190/80   Wt Readings from Last 3 Encounters:  04/10/20 (!) 305 lb (138.3 kg)  01/09/20 (!) 303 lb 3.2 oz (137.5 kg)  10/03/19 (!) 304 lb 3.2 oz (138 kg)      Physical Exam Vitals reviewed.  Constitutional:      General: She is not in acute distress.    Appearance: Normal appearance. She is well-developed. She is obese. She is not ill-appearing or diaphoretic.  Cardiovascular:     Rate and Rhythm: Normal rate and regular rhythm.     Pulses: Normal pulses.     Heart sounds: Normal heart sounds. No murmur heard.  No friction rub.  No gallop.   Pulmonary:     Effort: Pulmonary effort is normal. No respiratory distress.     Breath sounds: Normal breath sounds. No wheezing or rales.  Musculoskeletal:     Cervical back: Normal range of motion and neck supple.     Right lower leg: Edema present.     Left lower leg: Edema present.  Neurological:     Mental Status: She is alert.       Results for orders placed or performed in visit on 04/10/20  POCT glycosylated hemoglobin (Hb A1C)  Result Value Ref Range   Hemoglobin A1C 6.7 (A) 4.0 - 5.6 %   Est. average glucose Bld gHb Est-mCnc 146     Assessment & Plan     1. Type 2 diabetes mellitus without complication, without long-term current use of insulin (HCC) A1c stable at 6.7. Continue metformin 500mg  BID. F/U in 3 months for CPE.   2. Essential (primary) hypertension Stable. Patient reports leg swelling is not problematic and does not occur everyday. Continue Amlodipine 10mg , Losartan-HCTZ 100-25mg .   3. Need for influenza vaccination Flu vaccine given today without complication. Patient sat upright for 15 minutes to check for adverse reaction before being released. - Flu Vaccine QUAD High Dose(Fluad)   Return in about 3 months (around 07/11/2020) for CPE.      Reynolds Bowl, PA-C, have reviewed all documentation for this visit. The documentation on 04/14/20 for the exam, diagnosis, procedures, and orders are all accurate and complete.   Rubye Beach  [facility] [phone] (phone) [phone] (fax)  [place]

## 2020-06-23 ENCOUNTER — Telehealth: Payer: Self-pay

## 2020-06-23 NOTE — Telephone Encounter (Signed)
Copied from CRM [id number]. Topic: General - Other >> Jun 23, 2020 11:15 AM Jaquita Rector A wrote: Reason for CRM: Patient called to say that a family member that tested positive for Covid and would need to be tested on Thursday asking for a call please to set up an appointment since there is no place available in the area. Please call Ph# [phone]

## 2020-06-23 NOTE — Telephone Encounter (Signed)
Only the 3pm hospital f/u is available on Thursday.

## 2020-06-23 NOTE — Telephone Encounter (Signed)
We could do the 3pm virtual on Thursday and test her that day.  If we move hours of testing that day I can do the virtual during lunch and tell her the new hours for testing.

## 2020-06-23 NOTE — Telephone Encounter (Signed)
Scheduled at 11am 06/25/2020

## 2020-06-25 ENCOUNTER — Encounter: Payer: Self-pay | Admitting: Physician Assistant

## 2020-06-25 ENCOUNTER — Ambulatory Visit (INDEPENDENT_AMBULATORY_CARE_PROVIDER_SITE_OTHER): Payer: Medicare Other | Admitting: Physician Assistant

## 2020-06-25 DIAGNOSIS — Z20822 Contact with and (suspected) exposure to covid-19: Secondary | ICD-10-CM

## 2020-06-25 NOTE — Progress Notes (Signed)
Virtual telephone visit    Virtual Visit via Telephone Note   This visit type was conducted due to national recommendations for restrictions regarding the COVID-19 Pandemic (e.g. social distancing) in an effort to limit this patient's exposure and mitigate transmission in our community. Due to her co-morbid illnesses, this patient is at least at moderate risk for complications without adequate follow up. This format is felt to be most appropriate for this patient at this time. The patient did not have access to video technology or had technical difficulties with video requiring transitioning to audio format only (telephone). Physical exam was limited to content and character of the telephone converstion.    Patient location: Home Provider location: [facility]  I discussed the limitations of evaluation and management by telemedicine and the availability of in person appointments. The patient expressed understanding and agreed to proceed.   Visit Date: 06/25/2020  Today's healthcare provider: Margaretann LovelessJennifer M Sulema Braid, PA-C   Chief Complaint  Patient presents with  . Covid Exposure   Subjective    HPI  Patient requesting to have COVID test done due to positive COVID exposure on 06/20/2020. Patient denies any fever, cough, runny nose, shortness of breath, head ache, nausea, vomiting, loss of taste or smell.  Patient Active Problem List   Diagnosis Date Noted  . Screening for breast cancer 10/20/2017  . OSA (obstructive sleep apnea) 07/27/2017  . Axillary hidradenitis suppurativa 06/29/2016  . Vertigo 09/14/2015  . Chronic airway obstruction (HCC) 11/04/2014  . Avitaminosis D 11/04/2014  . Adiposity 11/04/2014  . Hypercholesteremia 11/04/2014  . Hemorrhagic cerebrovascular accident (CVA) (HCC) 11/04/2014  . Diabetes mellitus, type 2 (HCC) 10/01/2014  . Essential (primary) hypertension 10/01/2014  . Thalamic hemorrhage (HCC) 10/01/2014   Social History   Tobacco Use  . Smoking status:  Former Smoker    Quit date: 11/25/2009    Years since quitting: 10.5  . Smokeless tobacco: Never Used  Vaping Use  . Vaping Use: Never used  Substance Use Topics  . Alcohol use: No    Alcohol/week: 0.0 standard drinks  . Drug use: No   Allergies  Allergen Reactions  . Lisinopril Swelling  . Latex Rash      Medications: Outpatient Medications Prior to Visit  Medication Sig  . amLODipine (NORVASC) 10 MG tablet Take 1 tablet (10 mg total) by mouth daily.  Marland Kitchen. atorvastatin (LIPITOR) 80 MG tablet Take 1 tablet (80 mg total) by mouth daily.  . clopidogrel (PLAVIX) 75 MG tablet Take 1 tablet (75 mg total) by mouth daily.  . clotrimazole-betamethasone (LOTRISONE) cream APPLY  CREAM TOPICALLY TWICE DAILY  . losartan-hydrochlorothiazide (HYZAAR) 100-25 MG tablet Take 1 tablet by mouth daily.  . metFORMIN (GLUCOPHAGE) 500 MG tablet Take 1 tablet (500 mg total) by mouth 2 (two) times daily. With meal.   No facility-administered medications prior to visit.    Review of Systems  Constitutional: Negative.   Respiratory: Negative.   Cardiovascular: Negative.   Neurological: Negative.   Psychiatric/Behavioral: Negative.     Last CBC Lab Results  Component Value Date   WBC 12.0 (H) 06/25/2019   HGB 14.6 06/25/2019   HCT 45.2 06/25/2019   MCV 88 06/25/2019   MCH 28.3 06/25/2019   RDW 14.7 06/25/2019   PLT 257 06/25/2019   Last metabolic panel Lab Results  Component Value Date   GLUCOSE 116 (H) 06/25/2019   NA 143 06/25/2019   K 3.9 06/25/2019   CL 102 06/25/2019   CO2 28 06/25/2019  BUN 18 06/25/2019   CREATININE 0.93 06/25/2019   GFRNONAA 65 06/25/2019   GFRAA 75 06/25/2019   CALCIUM 9.0 06/25/2019   PROT 7.2 06/25/2019   ALBUMIN 3.7 (L) 06/25/2019   LABGLOB 3.5 06/25/2019   AGRATIO 1.1 (L) 06/25/2019   BILITOT 0.7 06/25/2019   ALKPHOS 73 06/25/2019   AST 20 06/25/2019   ALT 21 06/25/2019   ANIONGAP 8 04/03/2017   Last lipids Lab Results  Component Value Date    CHOL 126 06/25/2019   HDL 34 (L) 06/25/2019   LDLCALC 67 06/25/2019   TRIG 144 06/25/2019   CHOLHDL 4.4 10/20/2017   Last hemoglobin A1c Lab Results  Component Value Date   HGBA1C 6.7 (A) 04/10/2020      Objective    There were no vitals taken for this visit. BP Readings from Last 3 Encounters:  04/10/20 134/72  01/09/20 (!) 174/96  10/03/19 (!) 190/80   Wt Readings from Last 3 Encounters:  04/10/20 (!) 305 lb (138.3 kg)  01/09/20 (!) 303 lb 3.2 oz (137.5 kg)  10/03/19 (!) 304 lb 3.2 oz (138 kg)        Assessment & Plan     1. Exposure to COVID-19 virus Covid testing ordered as patient is in appropriate time frame for post exposure testing.  - COVID-19, Flu A+B and RSV   No follow-ups on file.    I discussed the assessment and treatment plan with the patient. The patient was provided an opportunity to ask questions and all were answered. The patient agreed with the plan and demonstrated an understanding of the instructions.   The patient was advised to call back or seek an in-person evaluation if the symptoms worsen or if the condition fails to improve as anticipated.  I provided 8 minutes of non-face-to-face time during this encounter.  Delmer Islam, PA-C, have reviewed all documentation for this visit. The documentation on 06/29/20 for the exam, diagnosis, procedures, and orders are all accurate and complete.  Reine Just [facility] [phone] (phone) [phone] (fax)  [facility]

## 2020-06-27 LAB — COVID-19, FLU A+B AND RSV
Influenza A, NAA: NOT DETECTED
Influenza B, NAA: NOT DETECTED
RSV, NAA: NOT DETECTED
SARS-CoV-2, NAA: NOT DETECTED

## 2020-06-29 ENCOUNTER — Encounter: Payer: Self-pay | Admitting: Physician Assistant

## 2020-06-30 ENCOUNTER — Ambulatory Visit (INDEPENDENT_AMBULATORY_CARE_PROVIDER_SITE_OTHER): Payer: Medicare Other

## 2020-06-30 ENCOUNTER — Other Ambulatory Visit: Payer: Self-pay

## 2020-06-30 DIAGNOSIS — Z0000 Encounter for general adult medical examination without abnormal findings: Secondary | ICD-10-CM | POA: Diagnosis not present

## 2020-06-30 DIAGNOSIS — Z1231 Encounter for screening mammogram for malignant neoplasm of breast: Secondary | ICD-10-CM

## 2020-06-30 DIAGNOSIS — E2839 Other primary ovarian failure: Secondary | ICD-10-CM | POA: Diagnosis not present

## 2020-06-30 DIAGNOSIS — Z1211 Encounter for screening for malignant neoplasm of colon: Secondary | ICD-10-CM | POA: Diagnosis not present

## 2020-06-30 NOTE — Patient Instructions (Signed)
Ms. Kristin Mora , Thank you for taking time to come for your Medicare Wellness Visit. I appreciate your ongoing commitment to your health goals. Please review the following plan we discussed and let me know if I can assist you in the future.   Screening recommendations/referrals: Colonoscopy: Currently due, cologuard ordered today. Mammogram: Ordered today. Pt aware office will contact her to schedule apt.  Bone Density: Ordered today. Pt aware office will contact her to schedule apt.  Recommended yearly ophthalmology/optometry visit for glaucoma screening and checkup Recommended yearly dental visit for hygiene and checkup  Vaccinations: Influenza vaccine: Done 04/10/20 Pneumococcal vaccine: Prevnar 13 due. Pt to receive at next in office apt. Tdap vaccine: Up to date, due 12/2020 Shingles vaccine: Shingrix discussed. Please contact your pharmacy for coverage information.     Advanced directives: Advance directive discussed with you today. Even though you declined this today please call our office should you change your mind and we can give you the proper paperwork for you to fill out.  Conditions/risks identified: Recommend to start exercising 3 days a week for at least 30 minutes at a time.   Next appointment: 07/17/20 @ 2:00 PM with [name]    Preventive Care 65 Years and Older, Female Preventive care refers to lifestyle choices and visits with your health care provider that can promote health and wellness. What does preventive care include?  A yearly physical exam. This is also called an annual well check.  Dental exams once or twice a year.  Routine eye exams. Ask your health care provider how often you should have your eyes checked.  Personal lifestyle choices, including:  Daily care of your teeth and gums.  Regular physical activity.  Eating a healthy diet.  Avoiding tobacco and drug use.  Limiting alcohol use.  Practicing safe sex.  Taking low-dose aspirin  every day.  Taking vitamin and mineral supplements as recommended by your health care provider. What happens during an annual well check? The services and screenings done by your health care provider during your annual well check will depend on your age, overall health, lifestyle risk factors, and family history of disease. Counseling  Your health care provider may ask you questions about your:  Alcohol use.  Tobacco use.  Drug use.  Emotional well-being.  Home and relationship well-being.  Sexual activity.  Eating habits.  History of falls.  Memory and ability to understand (cognition).  Work and work Astronomer.  Reproductive health. Screening  You may have the following tests or measurements:  Height, weight, and BMI.  Blood pressure.  Lipid and cholesterol levels. These may be checked every 5 years, or more frequently if you are over 74 years old.  Skin check.  Lung cancer screening. You may have this screening every year starting at age 86 if you have a 30-pack-year history of smoking and currently smoke or have quit within the past 15 years.  Fecal occult blood test (FOBT) of the stool. You may have this test every year starting at age 2.  Flexible sigmoidoscopy or colonoscopy. You may have a sigmoidoscopy every 5 years or a colonoscopy every 10 years starting at age 61.  Hepatitis C blood test.  Hepatitis B blood test.  Sexually transmitted disease (STD) testing.  Diabetes screening. This is done by checking your blood sugar (glucose) after you have not eaten for a while (fasting). You may have this done every 1-3 years.  Bone density scan. This is done to screen for osteoporosis. You  may have this done starting at age 72.  Mammogram. This may be done every 1-2 years. Talk to your health care provider about how often you should have regular mammograms. Talk with your health care provider about your test results, treatment options, and if necessary,  the need for more tests. Vaccines  Your health care provider may recommend certain vaccines, such as:  Influenza vaccine. This is recommended every year.  Tetanus, diphtheria, and acellular pertussis (Tdap, Td) vaccine. You may need a Td booster every 10 years.  Zoster vaccine. You may need this after age 28.  Pneumococcal 13-valent conjugate (PCV13) vaccine. One dose is recommended after age 68.  Pneumococcal polysaccharide (PPSV23) vaccine. One dose is recommended after age 72. Talk to your health care provider about which screenings and vaccines you need and how often you need them. This information is not intended to replace advice given to you by your health care provider. Make sure you discuss any questions you have with your health care provider. Document Released: 07/10/2015 Document Revised: 03/02/2016 Document Reviewed: 04/14/2015 Elsevier Interactive Patient Education  2017 [place] Prevention in the Home Falls can cause injuries. They can happen to people of all ages. There are many things you can do to make your home safe and to help prevent falls. What can I do on the outside of my home?  Regularly fix the edges of walkways and driveways and fix any cracks.  Remove anything that might make you trip as you walk through a door, such as a raised step or threshold.  Trim any bushes or trees on the path to your home.  Use bright outdoor lighting.  Clear any walking paths of anything that might make someone trip, such as rocks or tools.  Regularly check to see if handrails are loose or broken. Make sure that both sides of any steps have handrails.  Any raised decks and porches should have guardrails on the edges.  Have any leaves, snow, or ice cleared regularly.  Use sand or salt on walking paths during winter.  Clean up any spills in your garage right away. This includes oil or grease spills. What can I do in the bathroom?  Use night lights.  Install  grab bars by the toilet and in the tub and shower. Do not use towel bars as grab bars.  Use non-skid mats or decals in the tub or shower.  If you need to sit down in the shower, use a plastic, non-slip stool.  Keep the floor dry. Clean up any water that spills on the floor as soon as it happens.  Remove soap buildup in the tub or shower regularly.  Attach bath mats securely with double-sided non-slip rug tape.  Do not have throw rugs and other things on the floor that can make you trip. What can I do in the bedroom?  Use night lights.  Make sure that you have a light by your bed that is easy to reach.  Do not use any sheets or blankets that are too big for your bed. They should not hang down onto the floor.  Have a firm chair that has side arms. You can use this for support while you get dressed.  Do not have throw rugs and other things on the floor that can make you trip. What can I do in the kitchen?  Clean up any spills right away.  Avoid walking on wet floors.  Keep items that you use a lot  in easy-to-reach places.  If you need to reach something above you, use a strong step stool that has a grab bar.  Keep electrical cords out of the way.  Do not use floor polish or wax that makes floors slippery. If you must use wax, use non-skid floor wax.  Do not have throw rugs and other things on the floor that can make you trip. What can I do with my stairs?  Do not leave any items on the stairs.  Make sure that there are handrails on both sides of the stairs and use them. Fix handrails that are broken or loose. Make sure that handrails are as long as the stairways.  Check any carpeting to make sure that it is firmly attached to the stairs. Fix any carpet that is loose or worn.  Avoid having throw rugs at the top or bottom of the stairs. If you do have throw rugs, attach them to the floor with carpet tape.  Make sure that you have a light switch at the top of the stairs and  the bottom of the stairs. If you do not have them, ask someone to add them for you. What else can I do to help prevent falls?  Wear shoes that:  Do not have high heels.  Have rubber bottoms.  Are comfortable and fit you well.  Are closed at the toe. Do not wear sandals.  If you use a stepladder:  Make sure that it is fully opened. Do not climb a closed stepladder.  Make sure that both sides of the stepladder are locked into place.  Ask someone to hold it for you, if possible.  Clearly mark and make sure that you can see:  Any grab bars or handrails.  First and last steps.  Where the edge of each step is.  Use tools that help you move around (mobility aids) if they are needed. These include:  Canes.  Walkers.  Scooters.  Crutches.  Turn on the lights when you go into a dark area. Replace any light bulbs as soon as they burn out.  Set up your furniture so you have a clear path. Avoid moving your furniture around.  If any of your floors are uneven, fix them.  If there are any pets around you, be aware of where they are.  Review your medicines with your doctor. Some medicines can make you feel dizzy. This can increase your chance of falling. Ask your doctor what other things that you can do to help prevent falls. This information is not intended to replace advice given to you by your health care provider. Make sure you discuss any questions you have with your health care provider. Document Released: 04/09/2009 Document Revised: 11/19/2015 Document Reviewed: 07/18/2014 Elsevier Interactive Patient Education  2017 [employer].

## 2020-06-30 NOTE — Progress Notes (Signed)
Subjective:   Kristin Mora is a 65 y.o. female who presents for Medicare Annual (Subsequent) preventive examination.  I connected with Kristin Mora today by telephone and verified that I am speaking with the correct person using two identifiers. Location patient: home Location provider: work Persons participating in the virtual visit: patient, provider.   I discussed the limitations, risks, security and privacy concerns of performing an evaluation and management service by telephone and the availability of in person appointments. I also discussed with the patient that there may be a patient responsible charge related to this service. The patient expressed understanding and verbally consented to this telephonic visit.    Interactive audio and video telecommunications were attempted between this provider and patient, however failed, due to patient having technical difficulties OR patient did not have access to video capability.  We continued and completed visit with audio only.  Review of Systems    N/A  Cardiac Risk Factors include: advanced age (>51mn, >>65women);diabetes mellitus;dyslipidemia;hypertension     Objective:    There were no vitals filed for this visit. There is no height or weight on file to calculate BMI.  Advanced Directives 06/30/2020 04/03/2017 01/24/2017 07/10/2016 05/09/2016 09/13/2015 06/18/2015  Does Patient Have a Medical Advance Directive? _0  No No  Would patient like information on creating a medical advance directive? No - Patient declined No - Patient declined No - Patient declined No - Patient declined - No - patient declined information No - patient declined information    Current Medications (verified) Outpatient Encounter Medications as of 06/30/2020  Medication Sig  . amLODipine (NORVASC) 10 MG tablet Take 1 tablet (10 mg total) by mouth daily.  .Marland Kitchenatorvastatin (LIPITOR) 80 MG tablet Take 1 tablet (80 mg total) by mouth daily.  .  clopidogrel (PLAVIX) 75 MG tablet Take 1 tablet (75 mg total) by mouth daily.  . clotrimazole-betamethasone (LOTRISONE) cream APPLY  CREAM TOPICALLY TWICE DAILY  . losartan-hydrochlorothiazide (HYZAAR) 100-25 MG tablet Take 1 tablet by mouth daily.  . metFORMIN (GLUCOPHAGE) 500 MG tablet Take 1 tablet (500 mg total) by mouth 2 (two) times daily. With meal.   No facility-administered encounter medications on file as of 06/30/2020.    Allergies (verified) Lisinopril and Latex   History: Past Medical History:  Diagnosis Date  . Asthma   . Brain aneurysm 04/2007  . Diabetes mellitus without complication ([place]    controlled;  . HLD (hyperlipidemia)   . Hypertension    controlled with medication  . [place]   . Vertigo    Past Surgical History:  Procedure Laterality Date  . CRANIOTOMY  04/2007  . KNEE SURGERY    . TONSILLECTOMY AND ADENOIDECTOMY    . TUBAL LIGATION     Family History  Problem Relation Age of Onset  . Heart attack Mother   . Coronary artery disease Mother   . Lung cancer Father   . Diabetes Sister   . Stroke Brother   . Uterine cancer Sister   . Lung cancer Sister   . Bone cancer Brother    Social History   Socioeconomic History  . Marital status: Legally Separated    Spouse name: Not on file  . Number of children: 2  . Years of education: Not on file  . Highest education level: High school graduate  Occupational History  . Occupation: disabled  Tobacco Use  . Smoking status: Former Smoker    Quit date: 11/25/2009  Years since quitting: 10.6  . Smokeless tobacco: Never Used  Vaping Use  . Vaping Use: Never used  Substance and Sexual Activity  . Alcohol use: No    Alcohol/week: 0.0 standard drinks  . Drug use: No  . Sexual activity: Not on file  Other Topics Concern  . Not on file  Social History Narrative  . Not on file   Social Determinants of Health   Financial Resource Strain: Low Risk   . Difficulty of Paying Living Expenses: Not  hard at all  Food Insecurity: No Food Insecurity  . Worried About Charity fundraiser in the Last Year: Never true  . Ran Out of Food in the Last Year: Never true  Transportation Needs: No Transportation Needs  . Lack of Transportation (Medical): No  . Lack of Transportation (Non-Medical): No  Physical Activity: Inactive  . Days of Exercise per Week: 0 days  . Minutes of Exercise per Session: 0 min  Stress: No Stress Concern Present  . Feeling of Stress : Not at all  Social Connections: Moderately Isolated  . Frequency of Communication with Friends and Family: Three times a week  . Frequency of Social Gatherings with Friends and Family: More than three times a week  . Attends Religious Services: More than 4 times per year  . Active Member of Clubs or Organizations: No  . Attends Archivist Meetings: Never  . Marital Status: Separated    Tobacco Counseling Counseling given: Not Answered   Clinical Intake:  Pre-visit preparation completed: Yes  Pain : No/denies pain     Nutritional Risks: None Diabetes: Yes  How often do you need to have someone help you when you read instructions, pamphlets, or other written materials from your doctor or pharmacy?: 1 - Never  Diabetic? Yes  Nutrition Risk Assessment:  Has the patient had any N/V/D within the last 2 months?  No  Does the patient have any non-healing wounds?  No  Has the patient had any unintentional weight loss or weight gain?  No   Diabetes:  Is the patient diabetic?  Yes  If diabetic, was a CBG obtained today?  No  Did the patient bring in their glucometer from home?  No  How often do you monitor your CBG's? Not checking recently.   Financial Strains and Diabetes Management:  Are you having any financial strains with the device, your supplies or your medication? No .  Does the patient want to be seen by Chronic Care Management for management of their diabetes?  No  Would the patient like to be  referred to a Nutritionist or for Diabetic Management?  No   Diabetic Exams:  Diabetic Eye Exam: Overdue for diabetic eye exam. Pt has been advised about the importance in completing this exam. Patient advised to call and schedule an eye exam. Diabetic Foot Exam: Overdue, Pt has been advised about the importance in completing this exam.    Interpreter Needed?: No  Information entered by :: [facility], LPN   Activities of Daily Living In your present state of health, do you have any difficulty performing the following activities: 06/30/2020  Hearing? N  Vision? N  Difficulty concentrating or making decisions? N  Walking or climbing stairs? N  Dressing or bathing? N  Doing errands, shopping? Y  Comment Does not drive.  Preparing Food and eating ? N  Using the Toilet? N  In the past six months, have you accidently leaked urine? N  Do  you have problems with loss of bowel control? N  Managing your Medications? N  Managing your Finances? N  Housekeeping or managing your Housekeeping? N  Some recent data might be hidden    Patient Care Team: Rubye Beach as PCP - General (Family Medicine) Idelle Leech, OD (Optometry)  Indicate any recent Medical Services you may have received from other than [facility] providers in the past year (date may be approximate).     Assessment:   This is a routine wellness examination for Desiray.  Hearing/Vision screen No exam data present  Dietary issues and exercise activities discussed: Current Exercise Habits: The patient does not participate in regular exercise at present, Exercise limited by: None identified  Goals    . Exercise 3x per week (30 min per time)     Recommend to exercise for 3 days a week for at least 30 minutes at a time.       Depression Screen PHQ 2/9 Scores 06/30/2020 06/25/2019 06/22/2018  PHQ - 2 Score 0 0 0  PHQ- 9 Score - 9 -    Fall Risk Fall Risk  06/30/2020 06/25/2019 06/22/2018  Falls in the past year? 0  0 0  Number falls in past yr: 0 0 -  Injury with Fall? 0 0 -  Follow up - Falls evaluation completed -    FALL RISK PREVENTION PERTAINING TO THE HOME:  Any stairs in or around the home? No  If so, are there any without handrails? No  Home free of loose throw rugs in walkways, pet beds, electrical cords, etc? Yes  Adequate lighting in your home to reduce risk of falls? Yes   ASSISTIVE DEVICES UTILIZED TO PREVENT FALLS:  Life alert? No  Use of a cane, walker or w/c? No  Grab bars in the bathroom? No  Shower chair or bench in shower? No  Elevated toilet seat or a handicapped toilet? No    Cognitive Function:        Immunizations Immunization History  Administered Date(s) Administered  . Fluad Quad(high Dose 65+) 03/27/2019, 04/10/2020  . Influenza,inj,Quad PF,6+ Mos 05/26/2015  . Pneumococcal Polysaccharide-23 04/18/2012, 06/25/2019  . Tdap 12/27/2010    TDAP status: Up to date  Flu Vaccine status: Up to date  Pneumococcal vaccine status: Due, Education has been provided regarding the importance of this vaccine. Advised may receive this vaccine at local pharmacy or Health Dept. Aware to provide a copy of the vaccination record if obtained from local pharmacy or Health Dept. Verbalized acceptance and understanding.  Covid-19 vaccine status: Declined, Education has been provided regarding the importance of this vaccine but patient still declined. Advised may receive this vaccine at local pharmacy or Health Dept.or vaccine clinic. Aware to provide a copy of the vaccination record if obtained from local pharmacy or Health Dept. Verbalized acceptance and understanding.  Qualifies for Shingles Vaccine? Yes   Zostavax completed No   Shingrix Completed?: No.    Education has been provided regarding the importance of this vaccine. Patient has been advised to call insurance company to determine out of pocket expense if they have not yet received this vaccine. Advised may also receive  vaccine at local pharmacy or Health Dept. Verbalized acceptance and understanding.  Screening Tests Health Maintenance  Topic Date Due  . COVID-19 Vaccine (1) Never done  . COLONOSCOPY (Pts 45-64yr Insurance coverage will need to be confirmed)  Never done  . MAMMOGRAM  01/30/2012  . OPHTHALMOLOGY EXAM  09/13/2012  .  DEXA SCAN  Never done  . FOOT EXAM  06/24/2020  . PNA vac Low Risk Adult (2 of 2 - PCV13) 06/24/2020  . HEMOGLOBIN A1C  10/09/2020  . TETANUS/TDAP  12/26/2020  . INFLUENZA VACCINE  Completed  . Hepatitis C Screening  Completed    Health Maintenance  Health Maintenance Due  Topic Date Due  . COVID-19 Vaccine (1) Never done  . COLONOSCOPY (Pts 45-54yr Insurance coverage will need to be confirmed)  Never done  . MAMMOGRAM  01/30/2012  . OPHTHALMOLOGY EXAM  09/13/2012  . DEXA SCAN  Never done  . FOOT EXAM  06/24/2020  . PNA vac Low Risk Adult (2 of 2 - PCV13) 06/24/2020    Colorectal cancer screening: Currently due, declined colonoscopy referral today. Cologuard kit ordered.   Mammogram status: Ordered today. Pt provided with contact info and advised to call to schedule appt.   Bone Density status: Ordered today. Pt provided with contact info and advised to call to schedule appt.  Lung Cancer Screening: (Low Dose CT Chest recommended if Age 67-80years, 30 pack-year currently smoking OR have quit w/in 15years.) does qualify however declined screening at this time.  Additional Screening:  Hepatitis C Screening: Up to date   Vision Screening: Recommended annual ophthalmology exams for early detection of glaucoma and other disorders of the eye. Is the patient up to date with their annual eye exam?  Yes  Who is the provider or what is the name of the office in which the patient attends annual eye exams? Dr NMatilde Sprang If pt is not established with a provider, would they like to be referred to a provider to establish care? No .   Dental Screening: Recommended annual  dental exams for proper oral hygiene  Community Resource Referral / Chronic Care Management: CRR required this visit?  No   CCM required this visit?  No      Plan:     I have personally reviewed and noted the following in the patient's chart:   . Medical and social history . Use of alcohol, tobacco or illicit drugs  . Current medications and supplements . Functional ability and status . Nutritional status . Physical activity . Advanced directives . List of other physicians . Hospitalizations, surgeries, and ER visits in previous 12 months . Vitals . Screenings to include cognitive, depression, and falls . Referrals and appointments  In addition, I have reviewed and discussed with patient certain preventive protocols, quality metrics, and best practice recommendations. A written personalized care plan for preventive services as well as general preventive health recommendations were provided to patient.     Jeet Shough [place]  19/0/2409  Nurse Notes: Pt needs a Prevnar 13 vaccine and diabetic foot exam at next in office apt. Pt plans to call and schedule an eye exam with Dr NMatilde Sprangthis year. Pt declined receiving the Covid vaccine. Cologuard ordered today.

## 2020-07-17 ENCOUNTER — Encounter: Payer: Medicare Other | Admitting: Physician Assistant

## 2020-07-27 ENCOUNTER — Ambulatory Visit (INDEPENDENT_AMBULATORY_CARE_PROVIDER_SITE_OTHER): Payer: Medicare Other | Admitting: Physician Assistant

## 2020-07-27 ENCOUNTER — Other Ambulatory Visit: Payer: Self-pay

## 2020-07-27 ENCOUNTER — Encounter: Payer: Self-pay | Admitting: Physician Assistant

## 2020-07-27 VITALS — BP 122/78 | HR 80 | Temp 98.9°F | Resp 16 | Ht 62.0 in | Wt 300.4 lb

## 2020-07-27 DIAGNOSIS — E7800 Pure hypercholesterolemia, unspecified: Secondary | ICD-10-CM

## 2020-07-27 DIAGNOSIS — I10 Essential (primary) hypertension: Secondary | ICD-10-CM | POA: Diagnosis not present

## 2020-07-27 DIAGNOSIS — Z0000 Encounter for general adult medical examination without abnormal findings: Secondary | ICD-10-CM | POA: Diagnosis not present

## 2020-07-27 DIAGNOSIS — Z23 Encounter for immunization: Secondary | ICD-10-CM

## 2020-07-27 DIAGNOSIS — E559 Vitamin D deficiency, unspecified: Secondary | ICD-10-CM

## 2020-07-27 DIAGNOSIS — Z6841 Body Mass Index (BMI) 40.0 and over, adult: Secondary | ICD-10-CM

## 2020-07-27 DIAGNOSIS — E119 Type 2 diabetes mellitus without complications: Secondary | ICD-10-CM | POA: Diagnosis not present

## 2020-07-27 DIAGNOSIS — Z1239 Encounter for other screening for malignant neoplasm of breast: Secondary | ICD-10-CM

## 2020-07-27 DIAGNOSIS — Z1211 Encounter for screening for malignant neoplasm of colon: Secondary | ICD-10-CM

## 2020-07-27 NOTE — Patient Instructions (Addendum)
[facility] [address] [place],  [place]  [address] Main: [phone]   Preventive Care 65 Years and Older, Female Preventive care refers to lifestyle choices and visits with your health care provider that can promote health and wellness. This includes:  A yearly physical exam. This is also called an annual wellness visit.  Regular dental and eye exams.  Immunizations.  Screening for certain conditions.  Healthy lifestyle choices, such as: ? Eating a healthy diet. ? Getting regular exercise. ? Not using drugs or products that contain nicotine and tobacco. ? Limiting alcohol use. What can I expect for my preventive care visit? Physical exam Your health care provider will check your:  Height and weight. These may be used to calculate your BMI (body mass index). BMI is a measurement that tells if you are at a healthy weight.  Heart rate and blood pressure.  Body temperature.  Skin for abnormal spots. Counseling Your health care provider may ask you questions about your:  Past medical problems.  Family's medical history.  Alcohol, tobacco, and drug use.  Emotional well-being.  Home life and relationship well-being.  Sexual activity.  Diet, exercise, and sleep habits.  History of falls.  Memory and ability to understand (cognition).  Work and work environment.  Pregnancy and menstrual history.  Access to firearms. What immunizations do I need? Vaccines are usually given at various ages, according to a schedule. Your health care provider will recommend vaccines for you based on your age, medical history, and lifestyle or other factors, such as travel or where you work.   What tests do I need? Blood tests  Lipid and cholesterol levels. These may be checked every 5 years, or more often depending on your overall health.  Hepatitis C test.  Hepatitis B test. Screening  Lung cancer screening. You may have this  screening every year starting at age 55 if you have a 30-pack-year history of smoking and currently smoke or have quit within the past 15 years.  Colorectal cancer screening. ? All adults should have this screening starting at age 50 and continuing until age 75. ? Your health care provider may recommend screening at age 45 if you are at increased risk. ? You will have tests every 1-10 years, depending on your results and the type of screening test.  Diabetes screening. ? This is done by checking your blood sugar (glucose) after you have not eaten for a while (fasting). ? You may have this done every 1-3 years.  Mammogram. ? This may be done every 1-2 years. ? Talk with your health care provider about how often you should have regular mammograms.  Abdominal aortic aneurysm (AAA) screening. You may need this if you are a current or former smoker.  BRCA-related cancer screening. This may be done if you have a family history of breast, ovarian, tubal, or peritoneal cancers. Other tests  STD (sexually transmitted disease) testing, if you are at risk.  Bone density scan. This is done to screen for osteoporosis. You may have this done starting at age 65. Talk with your health care provider about your test results, treatment options, and if necessary, the need for more tests. Follow these instructions at home: Eating and drinking  Eat a diet that includes fresh fruits and vegetables, whole grains, lean protein, and low-fat dairy products. Limit your intake of foods with high amounts of sugar, saturated fats, and salt.  Take vitamin and mineral supplements as recommended by   your health care provider.  Do not drink alcohol if your health care provider tells you not to drink.  If you drink alcohol: ? Limit how much you have to 0-1 drink a day. ? Be aware of how much alcohol is in your drink. In the [place] one drink equals one 12 oz bottle of beer (355 mL), one 5 oz glass of wine (148 mL), or  one 1 oz glass of hard liquor (44 mL).   Lifestyle  Take daily care of your teeth and gums. Brush your teeth every morning and night with fluoride toothpaste. Floss one time each day.  Stay active. Exercise for at least 30 minutes 5 or more days each week.  Do not use any products that contain nicotine or tobacco, such as cigarettes, e-cigarettes, and chewing tobacco. If you need help quitting, ask your health care provider.  Do not use drugs.  If you are sexually active, practice safe sex. Use a condom or other form of protection in order to prevent STIs (sexually transmitted infections).  Talk with your health care provider about taking a low-dose aspirin or statin.  Find healthy ways to cope with stress, such as: ? Meditation, yoga, or listening to music. ? Journaling. ? Talking to a trusted person. ? Spending time with friends and family. Safety  Always wear your seat belt while driving or riding in a vehicle.  Do not drive: ? If you have been drinking alcohol. Do not ride with someone who has been drinking. ? When you are tired or distracted. ? While texting.  Wear a helmet and other protective equipment during sports activities.  If you have firearms in your house, make sure you follow all gun safety procedures. What's next?  Visit your health care provider once a year for an annual wellness visit.  Ask your health care provider how often you should have your eyes and teeth checked.  Stay up to date on all vaccines. This information is not intended to replace advice given to you by your health care provider. Make sure you discuss any questions you have with your health care provider. Document Revised: 06/03/2020 Document Reviewed: 06/07/2018 Elsevier Patient Education  2021 Elsevier Inc.   

## 2020-07-27 NOTE — Progress Notes (Signed)
Complete physical exam   Patient: Kristin Mora   DOB: 04/03/1956   65 y.o. Female  MRN: [id number] Visit Date: 07/27/2020  Today's healthcare provider: Mar Daring, PA-C   Chief Complaint  Patient presents with  . Annual Exam   Subjective    Kristin Mora is a 65 y.o. female who presents today for a complete physical exam.  She reports consuming a general diet. The patient does not participate in regular exercise at present. She generally feels fairly well. She reports sleeping well. She does not have additional problems to discuss today.  HPI    Past Medical History:  Diagnosis Date  . Asthma   . Brain aneurysm 04/2007  . Diabetes mellitus without complication ([facility])    controlled;  . HLD (hyperlipidemia)   . Hypertension    controlled with medication  . Stroke ([place])   . Vertigo    Past Surgical History:  Procedure Laterality Date  . CRANIOTOMY  04/2007  . KNEE SURGERY    . TONSILLECTOMY AND ADENOIDECTOMY    . TUBAL LIGATION     Social History   Socioeconomic History  . Marital status: Legally Separated    Spouse name: Not on file  . Number of children: 2  . Years of education: Not on file  . Highest education level: High school graduate  Occupational History  . Occupation: disabled  Tobacco Use  . Smoking status: Former Smoker    Quit date: 11/25/2009    Years since quitting: 10.6  . Smokeless tobacco: Never Used  Vaping Use  . Vaping Use: Never used  Substance and Sexual Activity  . Alcohol use: No    Alcohol/week: 0.0 standard drinks  . Drug use: No  . Sexual activity: Not on file  Other Topics Concern  . Not on file  Social History Narrative  . Not on file   Social Determinants of Health   Financial Resource Strain: Low Risk   . Difficulty of Paying Living Expenses: Not hard at all  Food Insecurity: No Food Insecurity  . Worried About Charity fundraiser in the Last Year: Never true  . Ran Out of Food in the Last Year: Never  true  Transportation Needs: No Transportation Needs  . Lack of Transportation (Medical): No  . Lack of Transportation (Non-Medical): No  Physical Activity: Inactive  . Days of Exercise per Week: 0 days  . Minutes of Exercise per Session: 0 min  Stress: No Stress Concern Present  . Feeling of Stress : Not at all  Social Connections: Moderately Isolated  . Frequency of Communication with Friends and Family: Three times a week  . Frequency of Social Gatherings with Friends and Family: More than three times a week  . Attends Religious Services: More than 4 times per year  . Active Member of Clubs or Organizations: No  . Attends Archivist Meetings: Never  . Marital Status: Separated  Intimate Partner Violence: Not At Risk  . Fear of Current or Ex-Partner: No  . Emotionally Abused: No  . Physically Abused: No  . Sexually Abused: No   Family Status  Relation Name Status  . Mother  Deceased  . Father  Deceased  . Sister 1 Alive  . Brother 1 Deceased  . Sister 2 Deceased  . Sister 3 Deceased  . Sister 4 Deceased  . Sister 5 Alive  . Brother 2 Deceased   Family History  Problem Relation Age of  Onset  . Heart attack Mother   . Coronary artery disease Mother   . Lung cancer Father   . Diabetes Sister   . Stroke Brother   . Uterine cancer Sister   . Lung cancer Sister   . Bone cancer Brother    Allergies  Allergen Reactions  . Lisinopril Swelling  . Latex Rash    Patient Care Team: Rubye Beach as PCP - General (Family Medicine) Idelle Leech, OD [facility])   Medications: Outpatient Medications Prior to Visit  Medication Sig  . amLODipine (NORVASC) 10 MG tablet Take 1 tablet (10 mg total) by mouth daily.  Marland Kitchen atorvastatin (LIPITOR) 80 MG tablet Take 1 tablet (80 mg total) by mouth daily.  . clopidogrel (PLAVIX) 75 MG tablet Take 1 tablet (75 mg total) by mouth daily.  . clotrimazole-betamethasone (LOTRISONE) cream APPLY  CREAM TOPICALLY TWICE  DAILY  . losartan-hydrochlorothiazide (HYZAAR) 100-25 MG tablet Take 1 tablet by mouth daily.  . metFORMIN (GLUCOPHAGE) 500 MG tablet Take 1 tablet (500 mg total) by mouth 2 (two) times daily. With meal.   No facility-administered medications prior to visit.    Review of Systems  Constitutional: Negative.   HENT: Negative.   Eyes: Negative.   Respiratory: Negative.   Cardiovascular: Positive for leg swelling.  Gastrointestinal: Negative.   Endocrine: Negative.   Genitourinary: Negative.   Musculoskeletal: Negative.   Skin: Positive for rash.  Allergic/Immunologic: Negative.   Neurological: Negative.   Hematological: Negative.   Psychiatric/Behavioral: Negative.     Last CBC Lab Results  Component Value Date   WBC 12.0 (H) 06/25/2019   HGB 14.6 06/25/2019   HCT 45.2 06/25/2019   MCV 88 06/25/2019   MCH 28.3 06/25/2019   RDW 14.7 06/25/2019   PLT 257 [id number]   Last metabolic panel Lab Results  Component Value Date   GLUCOSE 116 (H) 06/25/2019   NA 143 06/25/2019   K 3.9 06/25/2019   CL 102 06/25/2019   CO2 28 06/25/2019   BUN 18 06/25/2019   CREATININE 0.93 06/25/2019   GFRNONAA 65 06/25/2019   GFRAA 75 06/25/2019   CALCIUM 9.0 06/25/2019   PROT 7.2 06/25/2019   ALBUMIN 3.7 (L) 06/25/2019   LABGLOB 3.5 06/25/2019   AGRATIO 1.1 (L) 06/25/2019   BILITOT 0.7 06/25/2019   ALKPHOS 73 06/25/2019   AST 20 06/25/2019   ALT 21 06/25/2019   ANIONGAP 8 04/03/2017   Last lipids Lab Results  Component Value Date   CHOL 126 06/25/2019   HDL 34 (L) 06/25/2019   LDLCALC 67 06/25/2019   TRIG 144 06/25/2019   CHOLHDL 4.4 10/20/2017   Last hemoglobin A1c Lab Results  Component Value Date   HGBA1C 6.7 (A) 04/10/2020   Last thyroid functions Lab Results  Component Value Date   TSH 1.440 06/25/2019      Objective    BP 122/78 (BP Location: Left Arm, Patient Position: Sitting, Cuff Size: Large)   Pulse 80   Temp 98.9 F (37.2 C) (Oral)   Resp 16   Ht 5'  2" (1.575 m)   Wt (!) 300 lb 6.4 oz (136.3 kg)   SpO2 96%   BMI 54.94 kg/m  BP Readings from Last 3 Encounters:  07/27/20 122/78  04/10/20 134/72  01/09/20 (!) 174/96   Wt Readings from Last 3 Encounters:  07/27/20 (!) 300 lb 6.4 oz (136.3 kg)  04/10/20 (!) 305 lb (138.3 kg)  01/09/20 (!) 303 lb 3.2 oz (137.5 kg)  Physical Exam Vitals reviewed.  Constitutional:      General: She is not in acute distress.    Appearance: Normal appearance. She is well-developed and well-nourished. She is obese. She is not ill-appearing or diaphoretic.  HENT:     Head: Normocephalic and atraumatic.     Right Ear: Tympanic membrane, ear canal and external ear normal.     Left Ear: Tympanic membrane, ear canal and external ear normal.     Mouth/Throat:     Mouth: Oropharynx is clear and moist.  Eyes:     General: No scleral icterus.       Right eye: No discharge.        Left eye: No discharge.     Extraocular Movements: Extraocular movements intact and EOM normal.     Conjunctiva/sclera: Conjunctivae normal.     Pupils: Pupils are equal, round, and reactive to light.  Neck:     Thyroid: No thyromegaly.     Vascular: No carotid bruit or JVD.     Trachea: No tracheal deviation.  Cardiovascular:     Rate and Rhythm: Normal rate and regular rhythm.     Pulses: Normal pulses and intact distal pulses.     Heart sounds: Normal heart sounds. No murmur heard. No friction rub. No gallop.   Pulmonary:     Effort: Pulmonary effort is normal. No respiratory distress.     Breath sounds: Normal breath sounds. No wheezing or rales.  Chest:     Chest wall: No tenderness.  Abdominal:     General: Abdomen is protuberant. Bowel sounds are normal. There is no distension.     Palpations: Abdomen is soft. There is no mass.     Tenderness: There is no abdominal tenderness. There is no guarding or rebound.  Musculoskeletal:        General: No tenderness. Normal range of motion.     Cervical back: Normal  range of motion and neck supple. No tenderness.     Right lower leg: No edema.     Left lower leg: Edema present.  Lymphadenopathy:     Cervical: No cervical adenopathy.  Skin:    General: Skin is warm and dry.     Capillary Refill: Capillary refill takes less than 2 seconds.     Findings: No rash.  Neurological:     General: No focal deficit present.     Mental Status: She is alert and oriented to person, place, and time. Mental status is at baseline.  Psychiatric:        Mood and Affect: Mood and affect and mood normal.        Behavior: Behavior normal.        Thought Content: Thought content normal.        Judgment: Judgment normal.     Last depression screening scores PHQ 2/9 Scores 06/30/2020 06/25/2019 06/22/2018  PHQ - 2 Score 0 0 0  PHQ- 9 Score - 9 -   Last fall risk screening Fall Risk  06/30/2020  Falls in the past year? 0  Number falls in past yr: 0  Injury with Fall? 0  Follow up -   Last Audit-C alcohol use screening Alcohol Use Disorder Test (AUDIT) 06/30/2020  1. How often do you have a drink containing alcohol? 0  2. How many drinks containing alcohol do you have on a typical day when you are drinking? 0  3. How often do you have six or more drinks on one occasion?  0  AUDIT-C Score 0  Alcohol Brief Interventions/Follow-up AUDIT Score <7 follow-up not indicated   A score of 3 or more in women, and 4 or more in men indicates increased risk for alcohol abuse, EXCEPT if all of the points are from question 1   No results found for any visits on 07/27/20.  Assessment & Plan    Routine Health Maintenance and Physical Exam  Exercise Activities and Dietary recommendations Goals    . Exercise 3x per week (30 min per time)     Recommend to exercise for 3 days a week for at least 30 minutes at a time.        Immunization History  Administered Date(s) Administered  . Fluad Quad(high Dose 65+) 03/27/2019, 04/10/2020  . Influenza,inj,Quad PF,6+ Mos 05/26/2015  .  Pneumococcal Polysaccharide-23 04/18/2012, 06/25/2019  . Tdap 12/27/2010    Health Maintenance  Topic Date Due  . COVID-19 Vaccine (1) Never done  . COLONOSCOPY (Pts 45-76yrs Insurance coverage will need to be confirmed)  Never done  . MAMMOGRAM  01/30/2012  . OPHTHALMOLOGY EXAM  09/13/2012  . DEXA SCAN  Never done  . FOOT EXAM  06/24/2020  . PNA vac Low Risk Adult (2 of 2 - PCV13) 06/24/2020  . HEMOGLOBIN A1C  10/09/2020  . TETANUS/TDAP  12/26/2020  . INFLUENZA VACCINE  Completed  . Hepatitis C Screening  Completed    Discussed health benefits of physical activity, and encouraged her to engage in regular exercise appropriate for her age and condition.  1. Encounter for annual health examination Normal physical exam today. Will check labs as below and f/u pending lab results. If labs are stable and WNL she will not need to have these rechecked for one year at her next annual physical exam. She is to call the office in the meantime if she has any acute issue, questions or concerns. - CBC with Differential/Platelet - Comprehensive metabolic panel - TSH - Hemoglobin A1c  2. Type 2 diabetes mellitus without complication, without long-term current use of insulin (HCC) Stable. Continue Metformin 500mg  BID. On Statin and ARB. Due for eye exam with Dr. Matilde Sprang, patient aware to schedule. Will check labs as below and f/u pending results. - TSH - Hemoglobin A1c - Pneumococcal conjugate vaccine 13-valent  3. Essential (primary) hypertension Stable. Continue Amlodipine 10mg , losartan-hctz 100-25mg . Will check labs as below and f/u pending results. - CBC with Differential/Platelet - Comprehensive metabolic panel  4. Class 3 severe obesity due to excess calories with serious comorbidity and body mass index (BMI) of 50.0 to 59.9 in adult [facility]) Counseled patient on healthy lifestyle modifications including dieting and exercise.  Will check labs as below and f/u pending results.  5.  Hypercholesteremia Stable on Atorvastatin 80mg . Will check labs as below and f/u pending results. - Lipid Panel With LDL/HDL Ratio  6. Avitaminosis D H/O this and postmenopausal. Will check labs as below and f/u pending results. - VITAMIN D 25 Hydroxy (Vit-D Deficiency, Fractures)  7. Encounter for breast cancer screening using non-mammogram modality Mammogram ordered. Number provided to patient today to call and schedule.   8. Colon cancer screening Cologuard ordered. Made sure patient had received and made sure she had no questions.   9. Need for pneumococcal vaccination Prevnar 13 given to patient without issue.  - Pneumococcal conjugate vaccine 13-valent   No follow-ups on file.     Reynolds Bowl, PA-C, have reviewed all documentation for this visit. The documentation on 07/27/20 for  the exam, diagnosis, procedures, and orders are all accurate and complete.   Rubye Beach  [facility] [phone] (phone) [phone] (fax)  [place]

## 2020-07-28 ENCOUNTER — Other Ambulatory Visit: Payer: Self-pay | Admitting: Physician Assistant

## 2020-07-28 DIAGNOSIS — E559 Vitamin D deficiency, unspecified: Secondary | ICD-10-CM

## 2020-07-28 LAB — COMPREHENSIVE METABOLIC PANEL
ALT: 18 IU/L (ref 0–32)
AST: 17 IU/L (ref 0–40)
Albumin/Globulin Ratio: 1.2 (ref 1.2–2.2)
Albumin: 4.1 g/dL (ref 3.8–4.8)
Alkaline Phosphatase: 74 IU/L (ref 44–121)
BUN/Creatinine Ratio: 22 (ref 12–28)
BUN: 17 mg/dL (ref 8–27)
Bilirubin Total: 0.6 mg/dL (ref 0.0–1.2)
CO2: 29 mmol/L (ref 20–29)
Calcium: 9.8 mg/dL (ref 8.7–10.3)
Chloride: 101 mmol/L (ref 96–106)
Creatinine, Ser: 0.76 mg/dL (ref 0.57–1.00)
GFR calc Af Amer: 95 mL/min/{1.73_m2} (ref >59)
GFR calc non Af Amer: 82 mL/min/{1.73_m2} (ref >59)
Globulin, Total: 3.5 g/dL (ref 1.5–4.5)
Glucose: 107 mg/dL — ABNORMAL HIGH (ref 65–99)
Potassium: 4 mmol/L (ref 3.5–5.2)
Sodium: 145 mmol/L — ABNORMAL HIGH (ref 134–144)
Total Protein: 7.6 g/dL (ref 6.0–8.5)

## 2020-07-28 LAB — VITAMIN D 25 HYDROXY (VIT D DEFICIENCY, FRACTURES): Vit D, 25-Hydroxy: 10.4 ng/mL — ABNORMAL LOW (ref 30.0–100.0)

## 2020-07-28 LAB — LIPID PANEL WITH LDL/HDL RATIO
Cholesterol, Total: 116 mg/dL (ref 100–199)
HDL: 36 mg/dL — ABNORMAL LOW (ref >39)
LDL Chol Calc (NIH): 63 mg/dL (ref 0–99)
LDL/HDL Ratio: 1.8 ratio (ref 0.0–3.2)
Triglycerides: 85 mg/dL (ref 0–149)
VLDL Cholesterol Cal: 17 mg/dL (ref 5–40)

## 2020-07-28 LAB — CBC WITH DIFFERENTIAL/PLATELET
Basophils Absolute: 0 10*3/uL (ref 0.0–0.2)
Basos: 0 %
EOS (ABSOLUTE): 0.3 10*3/uL (ref 0.0–0.4)
Eos: 2 %
Hematocrit: 44.2 % (ref 34.0–46.6)
Hemoglobin: 14.3 g/dL (ref 11.1–15.9)
Immature Grans (Abs): 0.1 10*3/uL (ref 0.0–0.1)
Immature Granulocytes: 1 %
Lymphocytes Absolute: 2.4 10*3/uL (ref 0.7–3.1)
Lymphs: 20 %
MCH: 28.7 pg (ref 26.6–33.0)
MCHC: 32.4 g/dL (ref 31.5–35.7)
MCV: 89 fL (ref 79–97)
Monocytes Absolute: 0.8 10*3/uL (ref 0.1–0.9)
Monocytes: 7 %
Neutrophils Absolute: 8 10*3/uL — ABNORMAL HIGH (ref 1.4–7.0)
Neutrophils: 70 %
Platelets: 315 10*3/uL (ref 150–450)
RBC: 4.98 x10E6/uL (ref 3.77–5.28)
RDW: 14.9 % (ref 11.7–15.4)
WBC: 11.5 10*3/uL — ABNORMAL HIGH (ref 3.4–10.8)

## 2020-07-28 LAB — HEMOGLOBIN A1C
Est. average glucose Bld gHb Est-mCnc: 151 mg/dL
Hgb A1c MFr Bld: 6.9 % — ABNORMAL HIGH (ref 4.8–5.6)

## 2020-07-28 LAB — TSH: TSH: 1.87 u[IU]/mL (ref 0.450–4.500)

## 2020-07-28 MED ORDER — VITAMIN D (ERGOCALCIFEROL) 1.25 MG (50000 UNIT) PO CAPS: 50000.0000 [IU] | ORAL_CAPSULE | ORAL | 1 refills | Status: DC

## 2020-07-30 ENCOUNTER — Ambulatory Visit
Admission: RE | Admit: 2020-07-30 | Discharge: 2020-07-30 | Disposition: A | Discharge status: Home / Self Care | Payer: Medicare Other | Attending: Adult Health | Admitting: Adult Health

## 2020-07-30 ENCOUNTER — Ambulatory Visit
Admission: RE | Admit: 2020-07-30 | Discharge: 2020-07-30 | Disposition: A | Discharge status: Home / Self Care | Payer: Medicare Other | Source: Ambulatory Visit | Attending: Adult Health | Admitting: Adult Health

## 2020-07-30 ENCOUNTER — Ambulatory Visit (INDEPENDENT_AMBULATORY_CARE_PROVIDER_SITE_OTHER): Payer: Medicare Other | Admitting: Adult Health

## 2020-07-30 ENCOUNTER — Other Ambulatory Visit: Payer: Self-pay

## 2020-07-30 ENCOUNTER — Encounter: Payer: Self-pay | Admitting: Adult Health

## 2020-07-30 ENCOUNTER — Ambulatory Visit: Payer: Medicare Other | Admitting: Adult Health

## 2020-07-30 VITALS — BP 126/55 | HR 78 | Temp 97.8°F

## 2020-07-30 DIAGNOSIS — Z9889 Other specified postprocedural states: Secondary | ICD-10-CM

## 2020-07-30 DIAGNOSIS — M25561 Pain in right knee: Secondary | ICD-10-CM | POA: Diagnosis present

## 2020-07-30 DIAGNOSIS — W19XXXA Unspecified fall, initial encounter: Secondary | ICD-10-CM

## 2020-07-30 MED ORDER — IBUPROFEN 600 MG PO TABS: 600.0000 mg | ORAL_TABLET | Freq: Three times a day (TID) | ORAL | 0 refills | Status: DC | PRN

## 2020-07-30 NOTE — Patient Instructions (Addendum)
Apply a compressive ACE bandage. Rest and elevate the affected painful area.  Apply cold compresses intermittently as needed.  As pain recedes, begin normal activities slowly as tolerated.  Call if symptoms persist.   Meds ordered this encounter  Medications  . ibuprofen (ADVIL) 600 MG tablet    Sig: Take 1 tablet (600 mg total) by mouth every 8 (eight) hours as needed for moderate pain.    Dispense:  30 tablet    Refill:  0  may still use tylenol as directed on package as well if needed for pain.   Acute Knee Pain, Adult Many things can cause knee pain. Sometimes, knee pain is sudden (acute) and may be caused by damage, swelling, or irritation of the muscles and tissues that support your knee. The pain often goes away on its own with time and rest. If the pain does not go away, tests may be done to find out what is causing the pain. Follow these instructions at home: If you have a knee sleeve or brace:  Wear the knee sleeve or brace as told by your doctor. Take it off only as told by your doctor.  Loosen it if your toes: ? Tingle. ? Become numb. ? Turn cold and blue.  Keep it clean.  If the knee sleeve or brace is not waterproof: ? Do not let it get wet. ? Cover it with a watertight covering when you take a bath or shower.   Activity  Rest your knee.  Do not do things that cause pain or make pain worse.  Avoid activities where both feet leave the ground at the same time (high-impact activities). Examples are running, jumping rope, and doing jumping jacks.  Work with a physical therapist to make a safe exercise program, as told by your doctor. Managing pain, stiffness, and swelling  If told, put ice on the knee. To do this: ? If you have a removable knee sleeve or brace, take it off as told by your doctor. ? Put ice in a plastic bag. ? Place a towel between your skin and the bag. ? Leave the ice on for 20 minutes, 2-3 times a day. ? Take off the ice if your skin turns  bright red. This is very important. If you cannot feel pain, heat, or cold, you have a greater risk of damage to the area.  If told, use an elastic bandage to put pressure (compression) on your injured knee.  Raise your knee above the level of your heart while you are sitting or lying down.  Sleep with a pillow under your knee.   General instructions  Take over-the-counter and prescription medicines only as told by your doctor.  Do not smoke or use any products that contain nicotine or tobacco. If you need help quitting, ask your doctor.  If you are overweight, work with your doctor and a food expert (dietitian) to set goals to lose weight. Being overweight can make your knee hurt more.  Watch for any changes in your symptoms.  Keep all follow-up visits. Contact a doctor if:  The knee pain does not stop.  The knee pain changes or gets worse.  You have a fever along with knee pain.  Your knee is red or feels warm when you touch it.  Your knee gives out or locks up. Get help right away if:  Your knee swells, and the swelling gets worse.  You cannot move your knee.  You have very bad knee pain  that does not get better with pain medicine. Summary  Many things can cause knee pain. The pain often goes away on its own with time and rest.  Your doctor may do tests to find out the cause of the pain.  Watch for any changes in your symptoms. Relieve your pain with rest, medicines, light activity, and use of ice.  Get help right away if you cannot move your knee or your knee pain is very bad. This information is not intended to replace advice given to you by your health care provider. Make sure you discuss any questions you have with your health care provider. Document Revised: 11/27/2019 Document Reviewed: 11/27/2019 Elsevier Patient Education  2021 [employer].

## 2020-07-30 NOTE — Progress Notes (Signed)
Established patient visit   Patient: Kristin Mora   DOB: 1956/05/07   65 y.o. Female  MRN: [id number] Visit Date: 07/30/2020  Today's healthcare provider: Marcille Buffy, FNP   Chief Complaint  Patient presents with  . Fall   Subjective    Fall The accident occurred 12 to 24 hours ago. The fall occurred while walking. She fell from a height of 1 to 2 ft. She landed on hard floor. The point of impact was the right knee. The pain is present in the right knee. The symptoms are aggravated by flexion, movement, pressure on injury, rotation and use of injured limb. Pertinent negatives include no abdominal pain, bowel incontinence, fever, headaches, hearing loss, hematuria, loss of consciousness, nausea, numbness, tingling, visual change or vomiting. She has tried acetaminophen for the symptoms. The treatment provided no relief.   injury happened last night and she fell with all her weight on right knee.  Has tried Tylenol per package with some mild relief.  History of torn ligament surgery around 4-5 years ago with Dr. Marry Guan.  I am unable to find that record in Epic.   Denies dizziness, lightheadedness, pre syncopal or syncopal episodes.  Patient  denies any fever,,chills, rash, chest pain, shortness of breath, nausea, vomiting, or diarrhea.   denies any loss of consciousness or head trauma.   Patient Active Problem List   Diagnosis Date Noted  . Fall 07/30/2020  . Acute pain of right knee 07/30/2020  . History of right knee surgery 07/30/2020  . Screening for breast cancer 10/20/2017  . OSA (obstructive sleep apnea) 07/27/2017  . Axillary hidradenitis suppurativa 06/29/2016  . Vertigo 09/14/2015  . Chronic airway obstruction ([place]) 11/04/2014  . Vitamin D deficiency 11/04/2014  . Adiposity 11/04/2014  . Hypercholesteremia 11/04/2014  . Hemorrhagic cerebrovascular accident (CVA) ([place]) 11/04/2014  . Diabetes mellitus, type 2 ([place]) 10/01/2014  . Essential (primary)  hypertension 10/01/2014  . Thalamic hemorrhage ([place]) 10/01/2014   Past Medical History:  Diagnosis Date  . Asthma   . Brain aneurysm 04/2007  . Diabetes mellitus without complication ([place])    controlled;  . HLD (hyperlipidemia)   . Hypertension    controlled with medication  . Stroke ([place])   . Vertigo    Allergies  Allergen Reactions  . Lisinopril Swelling  . Latex Rash       Medications: Outpatient Medications Prior to Visit  Medication Sig  . amLODipine (NORVASC) 10 MG tablet Take 1 tablet (10 mg total) by mouth daily.  Marland Kitchen atorvastatin (LIPITOR) 80 MG tablet Take 1 tablet (80 mg total) by mouth daily.  . clopidogrel (PLAVIX) 75 MG tablet Take 1 tablet (75 mg total) by mouth daily.  . clotrimazole-betamethasone (LOTRISONE) cream APPLY  CREAM TOPICALLY TWICE DAILY  . losartan-hydrochlorothiazide (HYZAAR) 100-25 MG tablet Take 1 tablet by mouth daily.  . metFORMIN (GLUCOPHAGE) 500 MG tablet Take 1 tablet (500 mg total) by mouth 2 (two) times daily. With meal.  . Vitamin D, Ergocalciferol, (DRISDOL) 1.25 MG (50000 UNIT) CAPS capsule Take 1 capsule (50,000 Units total) by mouth every 7 (seven) days.   No facility-administered medications prior to visit.    Review of Systems  Constitutional: Negative for fever.  HENT: Negative.   Respiratory: Negative.   Cardiovascular: Negative.   Gastrointestinal: Negative.  Negative for abdominal pain, bowel incontinence, nausea and vomiting.  Genitourinary: Negative.  Negative for hematuria.  Musculoskeletal: Positive for arthralgias, gait problem and joint swelling. Negative for back  pain, myalgias, neck pain and neck stiffness.  Neurological: Negative for tingling, loss of consciousness, numbness and headaches.  Psychiatric/Behavioral: Negative.      Last CBC Lab Results  Component Value Date   WBC 11.5 (H) 07/27/2020   HGB 14.3 07/27/2020   HCT 44.2 07/27/2020   MCV 89 07/27/2020   MCH 28.7 07/27/2020   RDW 14.9 07/27/2020    PLT 315 58/52/7782   Last metabolic panel Lab Results  Component Value Date   GLUCOSE 107 (H) 07/27/2020   NA 145 (H) 07/27/2020   K 4.0 07/27/2020   CL 101 07/27/2020   CO2 29 07/27/2020   BUN 17 07/27/2020   CREATININE 0.76 07/27/2020   GFRNONAA 82 07/27/2020   GFRAA 95 07/27/2020   CALCIUM 9.8 07/27/2020   PROT 7.6 07/27/2020   ALBUMIN 4.1 07/27/2020   LABGLOB 3.5 07/27/2020   AGRATIO 1.2 07/27/2020   BILITOT 0.6 07/27/2020   ALKPHOS 74 07/27/2020   AST 17 07/27/2020   ALT 18 07/27/2020   ANIONGAP 8 04/03/2017       Objective    BP (!) 126/55   Pulse 78   Temp 97.8 F (36.6 C) (Oral)   SpO2 97%  BP Readings from Last 3 Encounters:  07/30/20 (!) 126/55  07/27/20 122/78  04/10/20 134/72   Wt Readings from Last 3 Encounters:  07/27/20 (!) 300 lb 6.4 oz (136.3 kg)  04/10/20 (!) 305 lb (138.3 kg)  01/09/20 (!) 303 lb 3.2 oz (137.5 kg)       Physical Exam Constitutional:      Appearance: Normal appearance. She is obese. She is not ill-appearing or diaphoretic.  Cardiovascular:     Rate and Rhythm: Normal rate and regular rhythm.     Pulses: Normal pulses.     Heart sounds: Normal heart sounds.  Pulmonary:     Effort: Pulmonary effort is normal.     Breath sounds: Normal breath sounds.  Abdominal:     General: There is no distension.     Palpations: Abdomen is soft.     Tenderness: There is no abdominal tenderness.  Musculoskeletal:        General: Swelling, tenderness and signs of injury present.     Right knee: Swelling present. No crepitus. Decreased range of motion. Tenderness present over the medial joint line. Normal pulse.     Left knee: Normal. Normal pulse.     Right lower leg: No deformity, lacerations, tenderness or bony tenderness. 1+ Edema present.     Left lower leg: No deformity, lacerations, tenderness or bony tenderness. 1+ Edema present.  Skin:    Findings: No rash.  Neurological:     Mental Status: She is alert.  Psychiatric:         Mood and Affect: Mood normal.        Behavior: Behavior normal.        Thought Content: Thought content normal.        Judgment: Judgment normal.      Chronic edema.   No results found for any visits on 07/30/20.  Assessment & Plan     Acute pain of right knee - Plan: DG Knee Complete 4 Views Right, ibuprofen (ADVIL) 600 MG tablet  Fall, initial encounter  History of right knee surgery  Orders Placed This Encounter  Procedures  . DG Knee Complete 4 Views Right    Order Specific Question:   Reason for Exam (SYMPTOM  OR DIAGNOSIS REQUIRED)    Answer:  fall and pain in right knee.history of knee surgery 4 years ago for ligament.    Order Specific Question:   Preferred imaging location?    Answer:   [facility]    Apply a compressive ACE bandage. Rest and elevate the affected painful area.  Apply cold compresses intermittently as needed.  As pain recedes, begin normal activities slowly as tolerated.  Call if symptoms persist.  Discussed may need follow up with orthopedics if no improvement and depending on x ray reslts, may need further work up. Also advised of [facility] walk in from 1- 7 Monday to Friday.   Meds ordered this encounter  Medications  . ibuprofen (ADVIL) 600 MG tablet    Sig: Take 1 tablet (600 mg total) by mouth every 8 (eight) hours as needed for moderate pain.    Dispense:  30 tablet    Refill:  0   Red Flags discussed. The patient was given clear instructions to go to ER or return to medical center if any red flags develop, symptoms do not improve, worsen or new problems develop. They verbalized understanding.  Return in about 1 week (around 08/06/2020), or if symptoms worsen or fail to improve, for at any time for any worsening symptoms, Go to Emergency room/ urgent care if worse.      The entirety of the information documented in the History of Present Illness, Review of Systems and Physical Exam were personally obtained by me. Portions of  this information were initially documented by the CMA and reviewed by me for thoroughness and accuracy.      Marcille Buffy, [place] [phone] (phone) [phone] (fax)  [place]

## 2020-07-30 NOTE — Progress Notes (Signed)
      Established patient visit  No show for appointment scheduled.

## 2020-07-31 ENCOUNTER — Other Ambulatory Visit: Payer: Self-pay | Admitting: Adult Health

## 2020-07-31 DIAGNOSIS — M1711 Unilateral primary osteoarthritis, right knee: Secondary | ICD-10-CM

## 2020-07-31 DIAGNOSIS — W19XXXA Unspecified fall, initial encounter: Secondary | ICD-10-CM

## 2020-07-31 DIAGNOSIS — Z9889 Other specified postprocedural states: Secondary | ICD-10-CM

## 2020-07-31 DIAGNOSIS — M25561 Pain in right knee: Secondary | ICD-10-CM

## 2020-07-31 NOTE — Progress Notes (Signed)
Orders Placed This Encounter  Procedures  . Ambulatory referral to Orthopedic Surgery

## 2020-07-31 NOTE — Progress Notes (Signed)
No fracture seen, moderate to sever arthritis seen. Moderate knee swelling.  Will place referral for her to go back to Dr. Marry Guan since she reported he did her knee durgery years ago, she may need MRI if not improving with Apply a compressive ACE bandage. Rest and elevate the affected painful area.  Apply cold compresses intermittently as needed.  As pain recedes, begin normal activities slowly as tolerated.  Call if symptoms persist.use walker to not weight bare.  Keep follow up in one week.  Referral placed back to Dr. Marry Guan per patient preference.

## 2020-08-04 ENCOUNTER — Telehealth: Payer: Self-pay

## 2020-08-04 NOTE — Telephone Encounter (Signed)
Copied from [place] [id number]. Topic: General - Other >> Aug 04, 2020  3:01 PM Tessa Lerner A wrote: Reason for CRM: Patient returned missed call from practice. Patient would like to be called back when possible and plans to have phone with them for remainder of the day

## 2020-08-05 NOTE — Telephone Encounter (Signed)
Patient advised it was most likely automated system calling to remind her of appt on 2/10/222. Patient denied any other questions.

## 2020-08-06 ENCOUNTER — Ambulatory Visit (INDEPENDENT_AMBULATORY_CARE_PROVIDER_SITE_OTHER): Payer: Medicare Other | Admitting: Physician Assistant

## 2020-08-06 ENCOUNTER — Other Ambulatory Visit: Payer: Self-pay

## 2020-08-06 ENCOUNTER — Encounter: Payer: Self-pay | Admitting: Physician Assistant

## 2020-08-06 VITALS — BP 104/61 | HR 72 | Temp 98.3°F | Resp 16 | Wt 301.0 lb

## 2020-08-06 DIAGNOSIS — W19XXXD Unspecified fall, subsequent encounter: Secondary | ICD-10-CM

## 2020-08-06 DIAGNOSIS — M25561 Pain in right knee: Secondary | ICD-10-CM

## 2020-08-06 NOTE — Patient Instructions (Signed)
Osteoarthritis  Osteoarthritis is a type of arthritis. It refers to joint pain or joint disease. Osteoarthritis affects tissue that covers the ends of bones in joints (cartilage). Cartilage acts as a cushion between the bones and helps them move smoothly. Osteoarthritis occurs when cartilage in the joints gets worn down. Osteoarthritis is sometimes called "wear and tear" arthritis. Osteoarthritis is the most common form of arthritis. It often occurs in older people. It is a condition that gets worse over time. The joints most often affected by this condition are in the fingers, toes, hips, knees, and spine, including the neck and lower back. What are the causes? This condition is caused by the wearing down of cartilage that covers the ends of bones. What increases the risk? The following factors may make you more likely to develop this condition:  Being age 50 or older.  Obesity.  Overuse of joints.  Past injury of a joint.  Past surgery on a joint.  Family history of osteoarthritis. What are the signs or symptoms? The main symptoms of this condition are pain, swelling, and stiffness in the joint. Other symptoms may include:  An enlarged joint.  More pain and further damage caused by small pieces of bone or cartilage that break off and float inside of the joint.  Small deposits of bone (osteophytes) that grow on the edges of the joint.  A grating or scraping feeling inside the joint when you move it.  Popping or creaking sounds when you move.  Difficulty walking or exercising.  An inability to grip items, twist your hand(s), or control the movements of your hands and fingers. How is this diagnosed? This condition may be diagnosed based on:  Your medical history.  A physical exam.  Your symptoms.  X-rays of the affected joint(s).  Blood tests to rule out other types of arthritis. How is this treated? There is no cure for this condition, but treatment can help control  pain and improve joint function. Treatment may include a combination of therapies, such as:  Pain relief techniques, such as: ? Applying heat and cold to the joint. ? Massage. ? A form of talk therapy called cognitive behavioral therapy (CBT). This therapy helps you set goals and follow up on the changes that you make.  Medicines for pain and inflammation. The medicines can be taken by mouth or applied to the skin. They include: ? NSAIDs, such as ibuprofen. ? Prescription medicines. ? Strong anti-inflammatory medicines (corticosteroids). ? Certain nutritional supplements.  A prescribed exercise program. You may work with a physical therapist.  Assistive devices, such as a brace, wrap, splint, specialized glove, or cane.  A weight control plan.  Surgery, such as: ? An osteotomy. This is done to reposition the bones and relieve pain or to remove loose pieces of bone and cartilage. ? Joint replacement surgery. You may need this surgery if you have advanced osteoarthritis. Follow these instructions at home: Activity  Rest your affected joints as told by your health care provider.  Exercise as told by your health care provider. He or she may recommend specific types of exercise, such as: ? Strengthening exercises. These are done to strengthen the muscles that support joints affected by arthritis. ? Aerobic activities. These are exercises, such as brisk walking or water aerobics, that increase your heart rate. ? Range-of-motion activities. These help your joints move more easily. ? Balance and agility exercises. Managing pain, stiffness, and swelling  If directed, apply heat to the affected area as often   as told by your health care provider. Use the heat source that your health care provider recommends, such as a moist heat pack or a heating pad. ? If you have a removable assistive device, remove it as told by your health care provider. ? Place a towel between your skin and the heat  source. If your health care provider tells you to keep the assistive device on while you apply heat, place a towel between the assistive device and the heat source. ? Leave the heat on for 20-30 minutes. ? Remove the heat if your skin turns bright red. This is especially important if you are unable to feel pain, heat, or cold. You may have a greater risk of getting burned.  If directed, put ice on the affected area. To do this: ? If you have a removable assistive device, remove it as told by your health care provider. ? Put ice in a plastic bag. ? Place a towel between your skin and the bag. If your health care provider tells you to keep the assistive device on during icing, place a towel between the assistive device and the bag. ? Leave the ice on for 20 minutes, 2-3 times a day. ? Move your fingers or toes often to reduce stiffness and swelling. ? Raise (elevate) the injured area above the level of your heart while you are sitting or lying down.      General instructions  Take over-the-counter and prescription medicines only as told by your health care provider.  Maintain a healthy weight. Follow instructions from your health care provider for weight control.  Do not use any products that contain nicotine or tobacco, such as cigarettes, e-cigarettes, and chewing tobacco. If you need help quitting, ask your health care provider.  Use assistive devices as told by your health care provider.  Keep all follow-up visits as told by your health care provider. This is important. Where to find more information  National Institute of Arthritis and Musculoskeletal and Skin Diseases: www.niams.nih.gov  National Institute on Aging: www.nia.nih.gov  American College of Rheumatology: www.rheumatology.org Contact a health care provider if:  You have redness, swelling, or a feeling of warmth in a joint that gets worse.  You have a fever along with joint or muscle aches.  You develop a  rash.  You have trouble doing your normal activities. Get help right away if:  You have pain that gets worse and is not relieved by pain medicine. Summary  Osteoarthritis is a type of arthritis that affects tissue covering the ends of bones in joints (cartilage).  This condition is caused by the wearing down of cartilage that covers the ends of bones.  The main symptom of this condition is pain, swelling, and stiffness in the joint.  There is no cure for this condition, but treatment can help control pain and improve joint function. This information is not intended to replace advice given to you by your health care provider. Make sure you discuss any questions you have with your health care provider. Document Revised: 06/10/2019 Document Reviewed: 06/10/2019 Elsevier Patient Education  2021 Elsevier Inc.  

## 2020-08-06 NOTE — Progress Notes (Signed)
Established patient visit   Patient: Kristin Mora   DOB: [birth year]   65 y.o. Female  MRN: [id number] Visit Date: 08/06/2020  Today's healthcare provider: Mar Daring, PA-C   Chief Complaint  Patient presents with  . Follow-up   Subjective    HPI  Follow up for fall  The patient was last seen for this 1 weeks ago. Changes made at last visit include x-ray and Ibuprofen PRN.  She reports excellent compliance with treatment. She feels that condition is Improved. She is not having side effects.   Still has some increased stiffness in the knee after sitting for a long time. -----------------------------------------------------------------------------------------  Patient Active Problem List   Diagnosis Date Noted  . Fall 07/30/2020  . Acute pain of right knee 07/30/2020  . History of right knee surgery 07/30/2020  . Screening for breast cancer 10/20/2017  . OSA (obstructive sleep apnea) 07/27/2017  . Axillary hidradenitis suppurativa 06/29/2016  . Vertigo 09/14/2015  . Chronic airway obstruction ([place]) 11/04/2014  . Vitamin D deficiency 11/04/2014  . Adiposity 11/04/2014  . Hypercholesteremia 11/04/2014  . Hemorrhagic cerebrovascular accident (CVA) ([place]) 11/04/2014  . Diabetes mellitus, type 2 ([place]) 10/01/2014  . Essential (primary) hypertension 10/01/2014  . Thalamic hemorrhage ([place]) 10/01/2014   Social History   Tobacco Use  . Smoking status: Former Smoker    Quit date: 11/25/2009    Years since quitting: 10.7  . Smokeless tobacco: Never Used  Vaping Use  . Vaping Use: Never used  Substance Use Topics  . Alcohol use: No    Alcohol/week: 0.0 standard drinks  . Drug use: No   Allergies  Allergen Reactions  . Lisinopril Swelling  . Latex Rash       Medications: Outpatient Medications Prior to Visit  Medication Sig  . amLODipine (NORVASC) 10 MG tablet Take 1 tablet (10 mg total) by mouth daily.  Marland Kitchen atorvastatin (LIPITOR) 80 MG tablet Take 1  tablet (80 mg total) by mouth daily.  . clopidogrel (PLAVIX) 75 MG tablet Take 1 tablet (75 mg total) by mouth daily.  . clotrimazole-betamethasone (LOTRISONE) cream APPLY  CREAM TOPICALLY TWICE DAILY  . ibuprofen (ADVIL) 600 MG tablet Take 1 tablet (600 mg total) by mouth every 8 (eight) hours as needed for moderate pain.  Marland Kitchen losartan-hydrochlorothiazide (HYZAAR) 100-25 MG tablet Take 1 tablet by mouth daily.  . metFORMIN (GLUCOPHAGE) 500 MG tablet Take 1 tablet (500 mg total) by mouth 2 (two) times daily. With meal.  . Vitamin D, Ergocalciferol, (DRISDOL) 1.25 MG (50000 UNIT) CAPS capsule Take 1 capsule (50,000 Units total) by mouth every 7 (seven) days.   No facility-administered medications prior to visit.    Review of Systems  Constitutional: Negative for activity change and fatigue.  Respiratory: Negative for chest tightness, shortness of breath and wheezing.   Cardiovascular: Negative for chest pain and palpitations.  Musculoskeletal: Positive for arthralgias and gait problem (antalgic). Negative for joint swelling.    Last CBC Lab Results  Component Value Date   WBC 11.5 (H) 07/27/2020   HGB 14.3 07/27/2020   HCT 44.2 07/27/2020   MCV 89 07/27/2020   MCH 28.7 07/27/2020   RDW 14.9 07/27/2020   PLT 315 07/27/2020        Objective    BP 104/61 (BP Location: Left Arm, Patient Position: Sitting, Cuff Size: Large)   Pulse 72   Temp 98.3 F (36.8 C) (Oral)   Resp 16   Wt (!) 301 lb (136.5  kg)   BMI 55.05 kg/m  BP Readings from Last 3 Encounters:  08/06/20 104/61  07/30/20 (!) 126/55  07/27/20 122/78   Wt Readings from Last 3 Encounters:  08/06/20 (!) 301 lb (136.5 kg)  07/27/20 (!) 300 lb 6.4 oz (136.3 kg)  04/10/20 (!) 305 lb (138.3 kg)      Physical Exam Vitals reviewed.  Constitutional:      General: She is not in acute distress.    Appearance: Normal appearance. She is well-developed and well-nourished. She is obese. She is not ill-appearing.  HENT:      Head: Normocephalic and atraumatic.  Eyes:     Extraocular Movements: EOM normal.  Pulmonary:     Effort: Pulmonary effort is normal. No respiratory distress.  Musculoskeletal:     Cervical back: Normal range of motion and neck supple.     Right knee: Crepitus present. No swelling, effusion or bony tenderness. Decreased range of motion. Tenderness present over the medial joint line. No LCL laxity, MCL laxity, ACL laxity or PCL laxity. Normal alignment, normal meniscus and normal patellar mobility. Normal pulse.     Instability Tests: Anterior drawer test negative. Posterior drawer test negative (did have pain on posterior movement without laxity). Anterior Lachman test negative. Medial McMurray test negative and lateral McMurray test negative.     Left knee: Normal.  Neurological:     Mental Status: She is alert.  Psychiatric:        Mood and Affect: Mood and affect and mood normal.        Behavior: Behavior normal.        Thought Content: Thought content normal.        Judgment: Judgment normal.       No results found for any visits on 08/06/20.  Assessment & Plan     1. Fall, subsequent encounter Slowly improving. Continue Ibuprofen. Elevate leg when at rest. Keep follow up with Dr. Marry Guan. Call if worsening.   2. Acute pain of right knee Secondary to fall but has moderate to severe OA. Reviewed Xrays with patient today in the office. Keep follow up with orthopedics.   I spent approximately 32 minutes with the patient today. Over 50% of this time was spent with counseling and educating the patient.   No follow-ups on file.      Reynolds Bowl, PA-C, have reviewed all documentation for this visit. The documentation on 08/06/20 for the exam, diagnosis, procedures, and orders are all accurate and complete.   Rubye Beach  [facility] [phone] (phone) [phone] (fax)  [place]

## 2020-11-08 ENCOUNTER — Other Ambulatory Visit: Payer: Self-pay | Admitting: Physician Assistant

## 2020-11-08 DIAGNOSIS — L304 Erythema intertrigo: Secondary | ICD-10-CM

## 2020-11-09 NOTE — Telephone Encounter (Signed)
Requested medication (s) are due for refill today:no  Requested medication (s) are on the active medication list: yes   Future visit scheduled: no  Notes to clinic:  Medication not assigned to a protocol, review manually   Requested Prescriptions  Pending Prescriptions Disp Refills   clotrimazole-betamethasone (LOTRISONE) cream [Pharmacy Med Name: Clotrimazole-Betamethasone 1-0.05 % External Cream] 30 g 0    Sig: APPLY  CREAM TOPICALLY TWICE DAILY      Off-Protocol Failed - 11/08/2020  6:10 PM      Failed - Medication not assigned to a protocol, review manually.      Passed - Valid encounter within last 12 months    Recent Outpatient Visits           3 months ago Fall, subsequent encounter   [place], [place]   3 months ago Acute pain of right knee   [facility] [place], Kelby Aline, FNP   3 months ago No-show for appointment   [place], FNP   3 months ago Encounter for annual health examination   [place], [place]   4 months ago Exposure to COVID-19 virus   [facility], Clearnce Sorrel, [place]

## 2020-11-15 ENCOUNTER — Other Ambulatory Visit: Payer: Self-pay | Admitting: Physician Assistant

## 2020-12-01 ENCOUNTER — Telehealth: Payer: Self-pay

## 2020-12-01 DIAGNOSIS — E2839 Other primary ovarian failure: Secondary | ICD-10-CM

## 2020-12-01 DIAGNOSIS — Z1231 Encounter for screening mammogram for malignant neoplasm of breast: Secondary | ICD-10-CM

## 2020-12-01 NOTE — Telephone Encounter (Signed)
Copied from [place] [id number]. Topic: General - Other >> Nov 30, 2020  2:33 PM Alanda Slim E wrote: Reason for CRM: Pt would like an appt/order for a mammogram and bone density / please advise

## 2020-12-15 ENCOUNTER — Other Ambulatory Visit: Payer: Self-pay | Admitting: Physician Assistant

## 2020-12-15 DIAGNOSIS — E119 Type 2 diabetes mellitus without complications: Secondary | ICD-10-CM

## 2020-12-15 DIAGNOSIS — E559 Vitamin D deficiency, unspecified: Secondary | ICD-10-CM

## 2020-12-15 DIAGNOSIS — I10 Essential (primary) hypertension: Secondary | ICD-10-CM

## 2020-12-17 ENCOUNTER — Other Ambulatory Visit: Payer: Self-pay | Admitting: Physician Assistant

## 2020-12-17 NOTE — Telephone Encounter (Signed)
Last appointment:  08/06/2020 Next appointment:  07/05/2021  Results for orders placed or performed in visit on 07/27/20  CBC with Differential/Platelet  Result Value Ref Range   WBC 11.5 (H) 3.4 - 10.8 x10E3/uL   RBC 4.98 3.77 - 5.28 x10E6/uL   Hemoglobin 14.3 11.1 - 15.9 g/dL   Hematocrit 44.2 34.0 - 46.6 %   MCV 89 79 - 97 fL   MCH 28.7 26.6 - 33.0 pg   MCHC 32.4 31.5 - 35.7 g/dL   RDW 14.9 11.7 - 15.4 %   Platelets 315 150 - 450 x10E3/uL   Neutrophils 70 Not Estab. %   Lymphs 20 Not Estab. %   Monocytes 7 Not Estab. %   Eos 2 Not Estab. %   Basos 0 Not Estab. %   Neutrophils Absolute 8.0 (H) 1.4 - 7.0 x10E3/uL   Lymphocytes Absolute 2.4 0.7 - 3.1 x10E3/uL   Monocytes Absolute 0.8 0.1 - 0.9 x10E3/uL   EOS (ABSOLUTE) 0.3 0.0 - 0.4 x10E3/uL   Basophils Absolute 0.0 0.0 - 0.2 x10E3/uL   Immature Granulocytes 1 Not Estab. %   Immature Grans (Abs) 0.1 0.0 - 0.1 x10E3/uL  Comprehensive metabolic panel  Result Value Ref Range   Glucose 107 (H) 65 - 99 mg/dL   BUN 17 8 - 27 mg/dL   Creatinine, Ser 0.76 0.57 - 1.00 mg/dL   GFR calc non Af Amer 82 >59 mL/min/1.73   GFR calc Af Amer 95 >59 mL/min/1.73   BUN/Creatinine Ratio 22 12 - 28   Sodium 145 (H) 134 - 144 mmol/L   Potassium 4.0 3.5 - 5.2 mmol/L   Chloride 101 96 - 106 mmol/L   CO2 29 20 - 29 mmol/L   Calcium 9.8 8.7 - 10.3 mg/dL   Total Protein 7.6 6.0 - 8.5 g/dL   Albumin 4.1 3.8 - 4.8 g/dL   Globulin, Total 3.5 1.5 - 4.5 g/dL   Albumin/Globulin Ratio 1.2 1.2 - 2.2   Bilirubin Total 0.6 0.0 - 1.2 mg/dL   Alkaline Phosphatase 74 44 - 121 IU/L   AST 17 0 - 40 IU/L   ALT 18 0 - 32 IU/L  TSH  Result Value Ref Range   TSH 1.870 0.450 - 4.500 uIU/mL  Hemoglobin A1c  Result Value Ref Range   Hgb A1c MFr Bld 6.9 (H) 4.8 - 5.6 %   Est. average glucose Bld gHb Est-mCnc 151 mg/dL  Lipid Panel With LDL/HDL Ratio  Result Value Ref Range   Cholesterol, Total 116 100 - 199 mg/dL   Triglycerides 85 0 - 149 mg/dL   HDL 36 (L)  >39 mg/dL   VLDL Cholesterol Cal 17 5 - 40 mg/dL   LDL Chol Calc (NIH) 63 0 - 99 mg/dL   LDL/HDL Ratio 1.8 0.0 - 3.2 ratio  VITAMIN D 25 Hydroxy (Vit-D Deficiency, Fractures)  Result Value Ref Range   Vit D, 25-Hydroxy 10.4 (L) 30.0 - 100.0 ng/mL   [place],   Blood count is normal. Kidney and liver function are normal. Thyroid is normal. Sugar/A1c did increase slightly from 6.7 to now 6.9. Make sure to take metformin as prescribed. Limit fatty foods, red meats and sugars. Cholesterol is normal. Vit D is quite low at 10.4. I will send in Vit D supplement to take once weekly. Once Prescription is completed transition to 1000-2000 IU Vit DOTC daily.    Grace Bushy, [facility]

## 2020-12-29 ENCOUNTER — Other Ambulatory Visit: Payer: Self-pay | Admitting: Physician Assistant

## 2020-12-29 DIAGNOSIS — I10 Essential (primary) hypertension: Secondary | ICD-10-CM

## 2020-12-30 ENCOUNTER — Other Ambulatory Visit: Payer: Medicare Other

## 2021-03-19 ENCOUNTER — Other Ambulatory Visit: Payer: Self-pay | Admitting: Family Medicine

## 2021-03-19 DIAGNOSIS — E559 Vitamin D deficiency, unspecified: Secondary | ICD-10-CM

## 2021-03-19 NOTE — Telephone Encounter (Signed)
Requested medications are due for refill today yes  Requested medications are on the active medication list yes  Last refill 12/21/20  Last visit 07/27/20, lab 06/2020  Future visit scheduled 07/05/21  Notes to clinic Not Delegated.

## 2021-03-22 NOTE — Telephone Encounter (Signed)
Medication refill

## 2021-03-23 ENCOUNTER — Other Ambulatory Visit: Payer: Self-pay | Admitting: Family Medicine

## 2021-03-30 ENCOUNTER — Other Ambulatory Visit: Payer: Self-pay | Admitting: Family Medicine

## 2021-03-30 DIAGNOSIS — E119 Type 2 diabetes mellitus without complications: Secondary | ICD-10-CM

## 2021-03-31 NOTE — Telephone Encounter (Signed)
I called pt to make an appt for her 6 mo check up and refills.   I left a voicemail for her to call [facility] and make an appt for her 6 mo. Check up.    I will give her a 30 day courtesy supply of the Metformin 500 mg.

## 2021-04-06 ENCOUNTER — Telehealth: Payer: Self-pay | Admitting: Physician Assistant

## 2021-04-06 NOTE — Telephone Encounter (Signed)
Medication Refill - Medication: Vitamin B12  Has the patient contacted their pharmacy? Yes.   Pt calling stating that pharmacy has sent over a request and has not heard back. Please advise.  (Agent: If no, request that the patient contact the pharmacy for the refill.) (Agent: If yes, when and what did the pharmacy advise?)  Preferred Pharmacy (with phone number or street name):  [facility] [place] [address]  [place] [address]  Phone: [phone] Fax: [phone]  Hours: Not open 24 hours   Has the patient been seen for an appointment in the last year OR does the patient have an upcoming appointment? Yes.    Agent: Please be advised that RX refills may take up to 3 business days. We ask that you follow-up with your pharmacy.

## 2021-04-07 NOTE — Telephone Encounter (Signed)
Medication is not on patient's list. Patient needs to discuss with provider at her next follow up.

## 2021-04-27 ENCOUNTER — Encounter: Payer: Self-pay | Admitting: Family Medicine

## 2021-04-27 ENCOUNTER — Other Ambulatory Visit: Payer: Self-pay

## 2021-04-27 ENCOUNTER — Ambulatory Visit (INDEPENDENT_AMBULATORY_CARE_PROVIDER_SITE_OTHER): Payer: Medicare Other | Admitting: Family Medicine

## 2021-04-27 VITALS — BP 172/86 | HR 77 | Temp 98.3°F | Wt 292.0 lb

## 2021-04-27 DIAGNOSIS — Z23 Encounter for immunization: Secondary | ICD-10-CM | POA: Diagnosis not present

## 2021-04-27 DIAGNOSIS — E7800 Pure hypercholesterolemia, unspecified: Secondary | ICD-10-CM | POA: Diagnosis not present

## 2021-04-27 DIAGNOSIS — G4733 Obstructive sleep apnea (adult) (pediatric): Secondary | ICD-10-CM

## 2021-04-27 DIAGNOSIS — Z8673 Personal history of transient ischemic attack (TIA), and cerebral infarction without residual deficits: Secondary | ICD-10-CM

## 2021-04-27 DIAGNOSIS — Z6841 Body Mass Index (BMI) 40.0 and over, adult: Secondary | ICD-10-CM

## 2021-04-27 DIAGNOSIS — I10 Essential (primary) hypertension: Secondary | ICD-10-CM

## 2021-04-27 DIAGNOSIS — E119 Type 2 diabetes mellitus without complications: Secondary | ICD-10-CM

## 2021-04-27 NOTE — Progress Notes (Signed)
Established patient visit   Patient: Kristin Mora   DOB: 25-Dec-1955   65 y.o. Female  MRN: [id number] Visit Date: 04/27/2021  Today's healthcare provider: Wilhemena Durie, MD   No chief complaint on file.  Subjective    HPI  Overall patient feels well.  He has 2 daughters and 6 grandchildren. Blood pressure is up today but she states she has not taken her medications today.  Diabetes Mellitus Type II, follow-up  Lab Results  Component Value Date   HGBA1C 6.9 (H) 07/27/2020   HGBA1C 6.7 (A) 04/10/2020   HGBA1C 6.7 (A) 01/09/2020   Last seen for diabetes 10 months ago.  Management since then includes continuing the same treatment. She reports good compliance with treatment. She is not having side effects.   Home blood sugar records:  Patient unable to give readings  Episodes of hypoglycemia? No    Current insulin regiment: none Most Recent Eye Exam: due  --------------------------------------------------------------------------------------------------- Hypertension, follow-up  BP Readings from Last 3 Encounters:  04/27/21 (!) 172/86  08/06/20 104/61  07/30/20 (!) 126/55   Wt Readings from Last 3 Encounters:  04/27/21 292 lb (132.5 kg)  08/06/20 (!) 301 lb (136.5 kg)  07/27/20 (!) 300 lb 6.4 oz (136.3 kg)     She was last seen for hypertension 10 months ago.  BP at that visit was 122/78. Management since that visit includes; Stable. Continue Amlodipine 10mg , losartan-hctz 100-25mg . She reports good compliance with treatment. She is not having side effects.  She is not exercising. She is not adherent to low salt diet.   Outside blood pressures are not being checked.  She does not smoke.  Use of agents associated with hypertension: none.   --------------------------------------------------------------------------------------------------- Lipid/Cholesterol, follow-up  Last Lipid Panel: Lab Results  Component Value Date   CHOL 116 07/27/2020    LDLCALC 63 07/27/2020   HDL 36 (L) 07/27/2020   TRIG 85 07/27/2020    She was last seen for this 10 months ago.  Management since that visit includes; on atorvastatin.  She reports good compliance with treatment. She is not having side effects.   She is following a Regular diet. Current exercise: none  Last metabolic panel Lab Results  Component Value Date   GLUCOSE 107 (H) 07/27/2020   NA 145 (H) 07/27/2020   K 4.0 07/27/2020   BUN 17 07/27/2020   CREATININE 0.76 07/27/2020   GFRNONAA 82 07/27/2020   CALCIUM 9.8 07/27/2020   AST 17 07/27/2020   ALT 18 07/27/2020   The ASCVD Risk score (Arnett DK, et al., 2019) failed to calculate for the following reasons:   The valid total cholesterol range is 130 to 320 mg/dL  ---------------------------------------------------------------------------------------------------     Medications: Outpatient Medications Prior to Visit  Medication Sig   amLODipine (NORVASC) 10 MG tablet Take 1 tablet (10 mg total) by mouth daily. Please schedule office visit before any future refill.   atorvastatin (LIPITOR) 80 MG tablet Take 1 tablet by mouth once daily   clopidogrel (PLAVIX) 75 MG tablet Take 1 tablet (75 mg total) by mouth daily.   clotrimazole-betamethasone (LOTRISONE) cream APPLY  CREAM TOPICALLY TWICE DAILY   ibuprofen (ADVIL) 600 MG tablet Take 1 tablet (600 mg total) by mouth every 8 (eight) hours as needed for moderate pain.   losartan-hydrochlorothiazide (HYZAAR) 100-25 MG tablet Take 1 tablet by mouth once daily   metFORMIN (GLUCOPHAGE) 500 MG tablet TAKE 1 TABLET BY MOUTH TWICE DAILY WITH A MEAL.  NEEDS TO SCEDULE APPOINTMENT FOR FURTHER REFILLS.   Vitamin D, Ergocalciferol, (DRISDOL) 1.25 MG (50000 UNIT) CAPS capsule Take 1 capsule by mouth once a week   No facility-administered medications prior to visit.    Review of Systems  Constitutional:  Negative for appetite change, fatigue and fever.  Respiratory:  Positive for  shortness of breath.   Cardiovascular:  Positive for leg swelling (left ankle). Negative for chest pain and palpitations.  Gastrointestinal:  Negative for abdominal pain, nausea and vomiting.  Neurological:  Negative for dizziness, weakness and headaches.   Last hemoglobin A1c Lab Results  Component Value Date   HGBA1C 6.9 (H) 07/27/2020       Objective    BP (!) 172/86 (BP Location: Left Arm, Patient Position: Sitting, Cuff Size: Large)   Pulse 77   Temp 98.3 F (36.8 C) (Oral)   Wt 292 lb (132.5 kg)   SpO2 96%   BMI 53.41 kg/m  BP Readings from Last 3 Encounters:  04/27/21 (!) 172/86  08/06/20 104/61  07/30/20 (!) 126/55   Wt Readings from Last 3 Encounters:  04/27/21 292 lb (132.5 kg)  08/06/20 (!) 301 lb (136.5 kg)  07/27/20 (!) 300 lb 6.4 oz (136.3 kg)      Physical Exam Vitals reviewed.  Constitutional:      General: She is not in acute distress.    Appearance: She is well-developed. She is obese.  HENT:     Head: Normocephalic and atraumatic.     Right Ear: Hearing normal.     Left Ear: Hearing normal.     Nose: Nose normal.  Eyes:     General: Lids are normal. No scleral icterus.       Right eye: No discharge.        Left eye: No discharge.     Conjunctiva/sclera: Conjunctivae normal.  Cardiovascular:     Rate and Rhythm: Normal rate and regular rhythm.     Heart sounds: Normal heart sounds.  Pulmonary:     Effort: Pulmonary effort is normal. No respiratory distress.  Abdominal:     Palpations: Abdomen is soft.  Skin:    General: Skin is warm and dry.     Findings: No lesion or rash.  Neurological:     General: No focal deficit present.     Mental Status: She is alert and oriented to person, place, and time.  Psychiatric:        Mood and Affect: Mood normal.        Speech: Speech normal.        Behavior: Behavior normal.        Thought Content: Thought content normal.        Judgment: Judgment normal.      No results found for any visits  on 04/27/21.  Assessment & Plan     1. Type 2 diabetes mellitus without complication, without long-term current use of insulin (HCC) Goal A1c less than 7.  She is on metformin alone. - Lipid panel - TSH - CBC w/Diff/Platelet - Comprehensive Metabolic Panel (CMET) - Hemoglobin A1c  2. Essential (primary) hypertension States she has not taken her losartan HCT and amlodipine today - Lipid panel - TSH - CBC w/Diff/Platelet - Comprehensive Metabolic Panel (CMET) - Hemoglobin A1c  3. Hypercholesteremia On atorvastatin - Lipid panel - TSH - CBC w/Diff/Platelet - Comprehensive Metabolic Panel (CMET) - Hemoglobin A1c  4. Class 3 severe obesity due to excess calories with serious comorbidity and body mass  index (BMI) of 50.0 to 59.9 in adult [facility]) Diet and exercise discussed - Lipid panel - TSH - CBC w/Diff/Platelet - Comprehensive Metabolic Panel (CMET) - Hemoglobin A1c  5. OSA (obstructive sleep apnea)  - Lipid panel - TSH - CBC w/Diff/Platelet - Comprehensive Metabolic Panel (CMET) - Hemoglobin A1c  6. Need for influenza vaccination  - Flu Vaccine QUAD High Dose(Fluad)  7. History of CVA (cerebrovascular accident) Plavix and all risk factors treated.   No follow-ups on file.      I, Wilhemena Durie, MD, have reviewed all documentation for this visit. The documentation on 05/02/21 for the exam, diagnosis, procedures, and orders are all accurate and complete.    Emmelyn Schmale Cranford Mon, MD  [facility] [phone] (phone) [phone] (fax)  [place]

## 2021-06-02 ENCOUNTER — Telehealth: Payer: Self-pay

## 2021-06-02 NOTE — Telephone Encounter (Signed)
Copied from [place] [id number]. Topic: General - Inquiry >> Jun 01, 2021  4:49 PM Loma [place] wrote: Reason for CRM: pt wants get info on handicapp stickers, fu at [phone]

## 2021-06-02 NOTE — Telephone Encounter (Signed)
Called patient to confirm information. She is requesting a handicap form due to having difficulty walking a long distance.

## 2021-06-03 NOTE — Telephone Encounter (Signed)
Called pt to let her know we are mailing her paperwork for a handicap placard.  [facility] nurse may give results to patient if they return call to clinic, a CRM has been created.  KP

## 2021-06-17 ENCOUNTER — Other Ambulatory Visit: Payer: Self-pay | Admitting: Family Medicine

## 2021-06-17 DIAGNOSIS — I10 Essential (primary) hypertension: Secondary | ICD-10-CM

## 2021-06-17 NOTE — Telephone Encounter (Signed)
Requested medications are due for refill today.  yes  Requested medications are on the active medications list.  yes  Last refill. 01/04/2021 #30   Future visit scheduled.   yes  Notes to clinic.  Labs are expired.    Requested Prescriptions  Pending Prescriptions Disp Refills   losartan-hydrochlorothiazide (HYZAAR) 100-25 MG tablet [Pharmacy Med Name: Losartan Potassium-HCTZ 100-25 MG Oral Tablet] 90 tablet 0    Sig: TAKE 1 TABLET BY MOUTH ONCE DAILY. NEEDS TO BE SEEN FOR FURTHER REFILLS.     Cardiovascular: ARB + Diuretic Combos Failed - 06/17/2021  2:35 AM      Failed - K in normal range and within 180 days    Potassium  Date Value Ref Range Status  07/27/2020 4.0 3.5 - 5.2 mmol/L Final  10/01/2014 3.7 mmol/L Final    Comment:    3.5-5.1 NOTE: New Reference Range  09/02/14           Failed - Na in normal range and within 180 days    Sodium  Date Value Ref Range Status  07/27/2020 145 (H) 134 - 144 mmol/L Final  10/01/2014 140 mmol/L Final    Comment:    135-145 NOTE: New Reference Range  09/02/14           Failed - Cr in normal range and within 180 days    Creatinine  Date Value Ref Range Status  10/01/2014 0.81 mg/dL Final    Comment:    0.44-1.00 NOTE: New Reference Range  09/02/14    Creatinine, Ser  Date Value Ref Range Status  07/27/2020 0.76 0.57 - 1.00 mg/dL Final          Failed - Ca in normal range and within 180 days    Calcium  Date Value Ref Range Status  07/27/2020 9.8 8.7 - 10.3 mg/dL Final   Calcium, Total  Date Value Ref Range Status  10/01/2014 9.5 mg/dL Final    Comment:    8.9-10.3 NOTE: New Reference Range  09/02/14           Failed - Last BP in normal range    BP Readings from Last 1 Encounters:  04/27/21 (!) 172/86          Passed - Patient is not pregnant      Passed - Valid encounter within last 6 months    Recent Outpatient Visits           1 month ago Type 2 diabetes mellitus without complication,  without long-term current use of insulin [facility])   [facility] Jerrol Banana., MD   10 months ago Fall, subsequent encounter   [place], [place]   10 months ago Acute pain of right knee   [facility] Flinchum, Kelby Aline, FNP   10 months ago No-show for appointment   [facility] Flinchum, Kelby Aline, FNP   10 months ago Encounter for annual health examination   [facility] P[place], Clearnce Sorrel, [place]       Future Appointments             In 2 months [name], PA-C [facility], [place]

## 2021-06-28 ENCOUNTER — Other Ambulatory Visit: Payer: Self-pay | Admitting: Physician Assistant

## 2021-06-28 ENCOUNTER — Other Ambulatory Visit: Payer: Self-pay | Admitting: Family Medicine

## 2021-06-28 DIAGNOSIS — I10 Essential (primary) hypertension: Secondary | ICD-10-CM

## 2021-08-06 ENCOUNTER — Ambulatory Visit: Payer: Self-pay | Admitting: *Deleted

## 2021-08-06 NOTE — Telephone Encounter (Signed)
Patient called, left VM to return the call to the office to discuss symptoms with a nurse. Unable to reach patient after 3 attempts by [facility] NT, routing to the provider for resolution per protocol.    Summary: possible laryngitis   See message   ----- Message from Sharene Skeans sent at 08/06/2021  1:33 PM EST -----  Pt states her throat is hurting on the right side/ stated she gets larengitis sometimes and wants to know what a nurse can advise her to do or if she needs an appt/ please advise

## 2021-08-06 NOTE — Telephone Encounter (Signed)
I returned her call.   She called in c/o her throat hurting on the right side and wanted a nurse to tell her if she needed an appt or not.     I left a voicemail to call back.

## 2021-08-06 NOTE — Telephone Encounter (Signed)
I attempted again to return her call.   Voicemail left to call back.

## 2021-08-15 ENCOUNTER — Emergency Department: Payer: Medicare Other

## 2021-08-15 ENCOUNTER — Emergency Department
Admission: EM | Admit: 2021-08-15 | Discharge: 2021-08-15 | Disposition: A | Discharge status: Home / Self Care | Payer: Medicare Other | Attending: Emergency Medicine | Admitting: Emergency Medicine

## 2021-08-15 ENCOUNTER — Encounter: Payer: Self-pay | Admitting: Emergency Medicine

## 2021-08-15 ENCOUNTER — Other Ambulatory Visit: Payer: Self-pay

## 2021-08-15 DIAGNOSIS — M5432 Sciatica, left side: Secondary | ICD-10-CM

## 2021-08-15 DIAGNOSIS — J45909 Unspecified asthma, uncomplicated: Secondary | ICD-10-CM | POA: Insufficient documentation

## 2021-08-15 DIAGNOSIS — E119 Type 2 diabetes mellitus without complications: Secondary | ICD-10-CM | POA: Insufficient documentation

## 2021-08-15 DIAGNOSIS — I10 Essential (primary) hypertension: Secondary | ICD-10-CM | POA: Insufficient documentation

## 2021-08-15 DIAGNOSIS — M5442 Lumbago with sciatica, left side: Secondary | ICD-10-CM | POA: Diagnosis not present

## 2021-08-15 DIAGNOSIS — M549 Dorsalgia, unspecified: Secondary | ICD-10-CM | POA: Diagnosis present

## 2021-08-15 MED ORDER — LIDOCAINE 5 % EX PTCH: 1.0000 | MEDICATED_PATCH | CUTANEOUS | 0 refills | Status: DC

## 2021-08-15 MED ORDER — IBUPROFEN 400 MG PO TABS
400.0000 mg | ORAL_TABLET | Freq: Once | ORAL | Status: AC
  Administered 2021-08-15: 400 mg via ORAL
  Filled 2021-08-15: qty 1

## 2021-08-15 MED ORDER — OXYCODONE-ACETAMINOPHEN 5-325 MG PO TABS
1.0000 | ORAL_TABLET | Freq: Once | ORAL | Status: AC
  Administered 2021-08-15: 1 via ORAL
  Filled 2021-08-15: qty 1

## 2021-08-15 MED ORDER — LIDOCAINE 5 % EX PTCH
1.0000 | MEDICATED_PATCH | CUTANEOUS | Status: DC
  Administered 2021-08-15: 1 via TRANSDERMAL
  Filled 2021-08-15: qty 1

## 2021-08-15 NOTE — ED Triage Notes (Signed)
Pt to ED via POV with c/o left hip pain, pt was bending down to get apple sauce out of the refrigerator and felt a pain go down her left hip. Pt is able to bear weight for a brief moment, is slow with ambulation. No swelling or deformity seen.

## 2021-08-15 NOTE — ED Notes (Signed)
See triage note  presents with left hip pain  denies a fall  states developed pain after bending  down  is able to bear some wt with increased pain

## 2021-08-15 NOTE — ED Provider Notes (Signed)
[facility] Provider Note    Event Date/Time   First MD Initiated Contact with Patient 08/15/21 1347     (approximate)   History   Hip Pain   HPI  Kristin Mora is a 65 y.o. female past medical history of intracranial aneurysm, diabetes hypertension hyperlipidemia presents with left back pain.  Symptoms been going on for about a week.  Started after she was bending down and then stood up and noticed a shooting pain down her left buttock.  Pain is in the middle of her back and then radiates down to the left buttock up to the mid thigh.  She denies numbness or weakness.  No urinary difficulty including incontinence and no fecal incontinence.  She denies any abnormal sensation in the genital area with wiping.  No fevers chills.  She has no history of IV drug use.  No history of back issues in the past.  Has been taking Tylenol only for it.  Has been able to ambulate with her walker.    Past Medical History:  Diagnosis Date   Asthma    Brain aneurysm 04/2007   Diabetes mellitus without complication ([place])    controlled;   HLD (hyperlipidemia)    Hypertension    controlled with medication   Stroke [facility])    Vertigo     Patient Active Problem List   Diagnosis Date Noted   Fall 07/30/2020   Acute pain of right knee 07/30/2020   History of right knee surgery 07/30/2020   Screening for breast cancer 10/20/2017   OSA (obstructive sleep apnea) 07/27/2017   Axillary hidradenitis suppurativa 06/29/2016   Vertigo 09/14/2015   Chronic airway obstruction ([place]) 11/04/2014   Vitamin D deficiency 11/04/2014   Adiposity 11/04/2014   Hypercholesteremia 11/04/2014   Diabetes mellitus, type 2 ([place]) 10/01/2014   Essential (primary) hypertension 10/01/2014     Physical Exam  Triage Vital Signs: ED Triage Vitals  Enc Vitals Group     BP 08/15/21 1251 (!) 185/81     Pulse Rate 08/15/21 1251 66     Resp 08/15/21 1251 18     Temp 08/15/21 1251 98.5 F (36.9 C)      Temp Source 08/15/21 1251 Oral     SpO2 08/15/21 1251 99 %     Weight 08/15/21 1244 292 lb 1.8 oz (132.5 kg)     Height 08/15/21 1244 5\' 2"  (1.575 m)     Head Circumference --      Peak Flow --      Pain Score 08/15/21 1243 10     Pain Loc --      Pain Edu? --      Excl. in [place]? --     Most recent vital signs: Vitals:   08/15/21 1251  BP: (!) 185/81  Pulse: 66  Resp: 18  Temp: 98.5 F (36.9 C)  SpO2: 99%     General: Awake, no distress.  CV:  Good peripheral perfusion.  Resp:  Normal effort.  Abd:  No distention.  Neuro:             Awake, Alert, Oriented x 3  Other:  Tenderness to palpation in the lumbar midline and over the left SI joint and glutes 5 out of 5 strength with knee extension, plantarflexion dorsiflexion bilaterally Sensation grossly intact to light touch in the bilateral lower extremities   ED Results / Procedures / Treatments  Labs (all labs ordered are listed, but only abnormal  results are displayed) Labs Reviewed - No data to display   EKG     RADIOLOGY Reviewed the x-ray of the left hip and pelvis, fracture dislocation   PROCEDURES:     MEDICATIONS ORDERED IN ED: Medications  lidocaine (LIDODERM) 5 % 1 patch (has no administration in time range)  oxyCODONE-acetaminophen (PERCOCET/ROXICET) 5-325 MG per tablet 1 tablet (has no administration in time range)  ibuprofen (ADVIL) tablet 400 mg (has no administration in time range)     IMPRESSION / MDM / ASSESSMENT AND PLAN / ED COURSE  I reviewed the triage vital signs and the nursing notes.                              Differential diagnosis includes, but is not limited to, sciatica, degenerative disease, compression fracture, less likely cauda equina  Patient is a 66 year old female presents with about 1 week of back pain radiating to her left buttock and left thigh.  She has no red flag symptoms including fevers bowel or bladder incontinence numbness or progressive weakness.  Has  been able to ambulate but with pain and with her walker.  Has been taking Tylenol for it.  No history of back pain in the past.  This all started after she was bending down to get something off the floor.  On exam she has both lumbar midline but also tenderness over the SI joint.  Full strength and normal sensation in her lower extremities.  My suspicion for cauda equina or cord compression is low given lack of red flag symptoms.  Presentation is most consistent with sciatica, potentially degenerative disc disease.  Will treat with a short course of ibuprofen and Tylenol and Lidoderm patch.  Patient has an appointment with her PCP in 10 days.  We discussed that she may eventually need an MRI if pain is not improving.  Also discussed return for red flag symptoms of cauda equina.  She is stable for discharge.      FINAL CLINICAL IMPRESSION(S) / ED DIAGNOSES   Final diagnoses:  Sciatica of left side     Rx / DC Orders   ED Discharge Orders          Ordered    lidocaine (LIDODERM) 5 %  Every 24 hours        08/15/21 1419             Note:  This document was prepared using Dragon voice recognition software and may include unintentional dictation errors.   Rada Hay, MD 08/15/21 [phone]

## 2021-08-15 NOTE — Discharge Instructions (Addendum)
You can use 400 mg of ibuprofen every 6 hours in addition to 1 g of Tylenol every 8 hours.  You can also apply the Lidoderm patch to the area of pain and changes every 24 hours.  Please follow-up with your primary care provider.  If you are developing any numbness or weakness in your leg or having difficulty urinating or stooling on yourself, please return to the emergency department.

## 2021-08-20 ENCOUNTER — Ambulatory Visit: Payer: Self-pay

## 2021-08-20 NOTE — Telephone Encounter (Signed)
°  Chief Complaint: leg pain Symptoms: LLE pain Frequency: ongoing, went to ED on 08/15/21 Pertinent Negatives: NA Disposition: [] ED /[] Urgent Care (no appt availability in office) / [x] Appointment(In office/virtual)/ []  [facility]/ [] Home Care/ [] Refused Recommended Disposition /[]  Mobile Bus/ []  Follow-up with PCP Additional Notes: Spoke with pt's daughter. Advised her we can schedule appt sooner than 08/25/21 for pt to be seen but she wanted to wait until that appt and if pain gets too severe will call back and make an appt earlier.    Reason for Disposition  [1] MODERATE pain (e.g., interferes with normal activities, limping) AND [2] present > 3 days  Answer Assessment - Initial Assessment Questions 1. ONSET: "When did the pain start?"      Ongoing but got worse on 08/15/21 2. LOCATION: "Where is the pain located?"      LLE 3. PAIN: "How bad is the pain?"    (Scale 1-10; or mild, moderate, severe)   -  MILD (1-3): doesn't interfere with normal activities    -  MODERATE (4-7): interferes with normal activities (e.g., work or school) or awakens from sleep, limping    -  SEVERE (8-10): excruciating pain, unable to do any normal activities, unable to walk     moderate 5. CAUSE: "What do you think is causing the leg pain?"     Sciatica  6. OTHER SYMPTOMS: "Do you have any other symptoms?" (e.g., chest pain, back pain, breathing difficulty, swelling, rash, fever, numbness, weakness)     Weakness in LLE  Protocols used: Leg Pain-A-AH

## 2021-08-24 NOTE — Progress Notes (Signed)
I,Sha'taria Tyson,acting as a Education administrator for [employer], PA-C.,have documented all relevant documentation on the behalf of [name], PA-C,as directed by  [name], PA-C while in the presence of [name], PA-C.  Established Patient Office Visit  Subjective:  Patient ID: Kristin Mora, female    DOB: 02-20-1956  Age: 66 y.o. MRN: [id number]  CC:  Chief Complaint  Patient presents with   Diabetes   Hyperlipidemia   Hypertension   Follow-up    HPI Kristin Mora presents for chronic care follow-up.  Low back spasm [name] reports that last month she bent over at her fridge and she felt a sharp pain on the left side of her low back, unable to stand up straight. Pain radiates down the front of her left hip. She went to the ED, they r/o left hip abnormality, she is managing at home with motrin, tylenol, and lidocaine pain patches. Reports some improvement but still unable to stand straight, she has an old walker that she has been using to get around.   Urinary urgency/frequency --reports for the last year or so, more urinary urgency/ frequency. Denies dysuria.  Difficult to discern from patient history if additional urgency and near-incontinence is new since low back pain started, pt endorses that recently she has been unable sometimes to make it to the restroom. Denies changes in BM/ saddle numbness.  Diabetes Mellitus Type II, follow-up  Lab Results  Component Value Date   HGBA1C 6.1 (A) 08/25/2021   HGBA1C 6.9 (H) 07/27/2020   HGBA1C 6.7 (A) 04/10/2020   Last seen for diabetes 3 months ago.  Management since then includes continuing the same treatment. She reports excellent compliance with treatment. She is not having side effects.   Home blood sugar records: takes but unsure of numbers  Episodes of hypoglycemia? Unknown doesn't check    Current insulin regiment:none Most Recent Eye Exam: Needs to  update  --------------------------------------------------------------------------------------------------- Hypertension, follow-up  BP Readings from Last 3 Encounters:  08/25/21 (!) 215/94  08/15/21 (!) 185/81  04/27/21 (!) 172/86   Wt Readings from Last 3 Encounters:  08/25/21 289 lb 8 oz (131.3 kg)  08/15/21 292 lb 1.8 oz (132.5 kg)  04/27/21 292 lb (132.5 kg)     She was last seen for hypertension 3 months ago.  BP at that visit was 172/86. Management since that visit includes continue the same treatment. She reports excellent compliance with treatment. She is not having side effects.  She is not exercising. She is adherent to low salt diet. Except for when she eats chips.  Outside blood pressures are checks but does not know numbers  She does not smoke.  Use of agents associated with hypertension: none.   --confirmed with daughter, not taking amlodipine currently.  --------------------------------------------------------------------------------------------------- Lipid/Cholesterol, follow-up  Last Lipid Panel: Lab Results  Component Value Date   CHOL 116 07/27/2020   LDLCALC 63 07/27/2020   HDL 36 (L) 07/27/2020   TRIG 85 07/27/2020    She was last seen for this 3 months ago.  Management since that visit includes continue the same treatment.  She reports excellent compliance with treatment. She is not having side effects.   Symptoms: Yes appetite changes No foot ulcerations  No chest pain No chest pressure/discomfort  No dyspnea No orthopnea  No fatigue Yes lower extremity edema (both)  No palpitations No paroxysmal nocturnal dyspnea  No nausea No numbness or tingling of extremity  No polydipsia No polyuria  No speech difficulty No  syncope   She is following a Low Sodium, Diabetic diet. Current exercise: none  Last metabolic panel Lab Results  Component Value Date   GLUCOSE 107 (H) 07/27/2020   NA 145 (H) 07/27/2020   K 4.0 07/27/2020   BUN 17  07/27/2020   CREATININE 0.76 07/27/2020   GFRNONAA 82 07/27/2020   CALCIUM 9.8 07/27/2020   AST 17 07/27/2020   ALT 18 07/27/2020   The ASCVD Risk score (Arnett DK, et al., 2019) failed to calculate for the following reasons:   The valid systolic blood pressure range is 90 to 200 mmHg   The valid total cholesterol range is 130 to 320 mg/dL  ---------------------------------------------------------------------------------------------------   Past Medical History:  Diagnosis Date   Asthma    Brain aneurysm 04/2007   Diabetes mellitus without complication (HCC)    controlled;   HLD (hyperlipidemia)    Hypertension    controlled with medication   Stroke [facility])    Vertigo     Past Surgical History:  Procedure Laterality Date   CRANIOTOMY  04/2007   KNEE SURGERY     TONSILLECTOMY AND ADENOIDECTOMY     TUBAL LIGATION      Family History  Problem Relation Age of Onset   Heart attack Mother    Coronary artery disease Mother    Lung cancer Father    Diabetes Sister    Stroke Brother    Uterine cancer Sister    Lung cancer Sister    Bone cancer Brother     Social History   Socioeconomic History   Marital status: Legally Separated    Spouse name: Not on file   Number of children: 2   Years of education: Not on file   Highest education level: High school graduate  Occupational History   Occupation: disabled  Tobacco Use   Smoking status: Former    Types: Cigarettes    Quit date: 11/25/2009    Years since quitting: 11.7   Smokeless tobacco: Never  Vaping Use   Vaping Use: Never used  Substance and Sexual Activity   Alcohol use: No    Alcohol/week: 0.0 standard drinks   Drug use: No   Sexual activity: Not on file  Other Topics Concern   Not on file  Social History Narrative   Not on file   Social Determinants of Health   Financial Resource Strain: Not on file  Food Insecurity: Not on file  Transportation Needs: Not on file  Physical Activity: Not on file   Stress: Not on file  Social Connections: Not on file  Intimate Partner Violence: Not on file    Outpatient Medications Prior to Visit  Medication Sig Dispense Refill   atorvastatin (LIPITOR) 80 MG tablet Take 1 tablet by mouth once daily 90 tablet 0   clotrimazole-betamethasone (LOTRISONE) cream APPLY  CREAM TOPICALLY TWICE DAILY 30 g 0   ibuprofen (ADVIL) 600 MG tablet Take 1 tablet (600 mg total) by mouth every 8 (eight) hours as needed for moderate pain. 30 tablet 0   lidocaine (LIDODERM) 5 % Place 1 patch onto the skin daily. Remove & Discard patch within 12 hours or as directed by MD 30 patch 0   Vitamin D, Ergocalciferol, (DRISDOL) 1.25 MG (50000 UNIT) CAPS capsule Take 1 capsule by mouth once a week 12 capsule 0   amLODipine (NORVASC) 10 MG tablet Take 1 tablet (10 mg total) by mouth daily. Please schedule office visit before any future refill. 90 tablet 0  losartan-hydrochlorothiazide (HYZAAR) 100-25 MG tablet TAKE 1 TABLET BY MOUTH ONCE DAILY. NEEDS TO BE SEEN FOR FURTHER REFILLS. 90 tablet 0   metFORMIN (GLUCOPHAGE) 500 MG tablet TAKE 1 TABLET BY MOUTH TWICE DAILY WITH A MEAL. NEEDS TO SCEDULE APPOINTMENT FOR FURTHER REFILLS. 60 tablet 0   clopidogrel (PLAVIX) 75 MG tablet Take 1 tablet (75 mg total) by mouth daily. (Patient not taking: Reported on 08/25/2021) 90 tablet 3   No facility-administered medications prior to visit.    Allergies  Allergen Reactions   Lisinopril Swelling   Latex Rash    ROS Review of Systems  Constitutional:  Negative for fatigue and fever.  Respiratory:  Negative for cough and shortness of breath.   Cardiovascular:  Negative for chest pain and leg swelling.  Gastrointestinal:  Negative for abdominal pain.  Genitourinary:  Positive for frequency and urgency.  Musculoskeletal:  Positive for back pain and gait problem.  Neurological:  Negative for dizziness and headaches.     Objective:    Physical Exam Constitutional:      Appearance:  Normal appearance. She is not ill-appearing.  HENT:     Head: Normocephalic.  Eyes:     Conjunctiva/sclera: Conjunctivae normal.  Cardiovascular:     Rate and Rhythm: Normal rate and regular rhythm.     Pulses:          Dorsalis pedis pulses are 3+ on the right side and 3+ on the left side.     Heart sounds: Normal heart sounds.  Pulmonary:     Effort: Pulmonary effort is normal.     Breath sounds: Normal breath sounds.  Musculoskeletal:     Right lower leg: Edema present.     Left lower leg: Edema present.     Comments: Unable to fully eval low back/L hip sciatica d/t movement limitations. Neg straight leg raise on L from seated position. + pain on extension of lumbar spine Non pitting bilateral lower leg venous stasis edema  Feet:     Right foot:     Protective Sensation: 3 sites tested.  3 sites sensed.     Left foot:     Protective Sensation: 3 sites tested.  3 sites sensed.  Neurological:     Mental Status: She is oriented to person, place, and time.  Psychiatric:        Mood and Affect: Mood normal.        Behavior: Behavior normal.    BP (!) 215/94    Pulse 76    Ht 5\' 2"  (1.575 m)    Wt 289 lb 8 oz (131.3 kg)    SpO2 100%    BMI 52.95 kg/m  Wt Readings from Last 3 Encounters:  08/25/21 289 lb 8 oz (131.3 kg)  08/15/21 292 lb 1.8 oz (132.5 kg)  04/27/21 292 lb (132.5 kg)     Health Maintenance Due  Topic Date Due   COLONOSCOPY (Pts 45-35yrs Insurance coverage will need to be confirmed)  Never done   Zoster Vaccines- Shingrix (1 of 2) Never done   MAMMOGRAM  01/30/2012   OPHTHALMOLOGY EXAM  09/13/2012   DEXA SCAN  Never done   FOOT EXAM  06/24/2020   TETANUS/TDAP  12/26/2020    There are no preventive care reminders to display for this patient.  Lab Results  Component Value Date   TSH 1.870 07/27/2020   Lab Results  Component Value Date   WBC 11.5 (H) 07/27/2020   HGB 14.3 07/27/2020  HCT 44.2 07/27/2020   MCV 89 07/27/2020   PLT 315 07/27/2020    Lab Results  Component Value Date   NA 145 (H) 07/27/2020   K 4.0 07/27/2020   CO2 29 07/27/2020   GLUCOSE 107 (H) 07/27/2020   BUN 17 07/27/2020   CREATININE 0.76 07/27/2020   BILITOT 0.6 07/27/2020   ALKPHOS 74 07/27/2020   AST 17 07/27/2020   ALT 18 07/27/2020   PROT 7.6 07/27/2020   ALBUMIN 4.1 07/27/2020   CALCIUM 9.8 07/27/2020   ANIONGAP 8 04/03/2017   Lab Results  Component Value Date   CHOL 116 07/27/2020   Lab Results  Component Value Date   HDL 36 (L) 07/27/2020   Lab Results  Component Value Date   LDLCALC 63 07/27/2020   Lab Results  Component Value Date   TRIG 85 07/27/2020   Lab Results  Component Value Date   CHOLHDL 4.4 10/20/2017   Lab Results  Component Value Date   HGBA1C 6.1 (A) 08/25/2021      Assessment & Plan:   Problem List Items Addressed This Visit       Cardiovascular and Mediastinum   Hypertension associated with diabetes ([place])    Uncontrolled today. Pt states she is consistent with her medication, just hasn't taken yet today. --When speaking to daughter, she is taking losartan 100/hctz 25, but not amlodipine 10 mg. We will refill this. Reports her bp always goes high when she is in pain and she is in pain currently. Repeat was 215/94 after resting for 10 minutes after ambulating to restroom.   --restart amlodipine 10 mg  Advised her to check 2-3 times a week at home, f/u in 1 week for nurse visit to check BP. F/u with me 3 mo       Relevant Medications   losartan-hydrochlorothiazide (HYZAAR) 100-25 MG tablet   aspirin 81 MG EC tablet   metFORMIN (GLUCOPHAGE) 500 MG tablet   amLODipine (NORVASC) 10 MG tablet   Other Relevant Orders   Comprehensive Metabolic Panel (CMET)     Endocrine   Diabetes mellitus, type 2 ([place]) - Primary    Well controlled on metformin 500 mg bid.  Will recheck a1c 3 mo as not yet stable. --consider switching to ozempic for additional weight loss benefit      Relevant Medications    losartan-hydrochlorothiazide (HYZAAR) 100-25 MG tablet   aspirin 81 MG EC tablet   metFORMIN (GLUCOPHAGE) 500 MG tablet   Other Relevant Orders   POCT HgB A1C (Completed)   Comprehensive Metabolic Panel (CMET)   Urine Microalbumin w/creat. ratio     Musculoskeletal and Integument   Strain of lumbar region    Continue w/ pain control regimen. Referred to physical therapy. Unclear if urinary urgency is associated. Low back xray was not completed in ED. Lumbar films ordered.        Relevant Orders   Ambulatory referral to Physical Therapy   DG Lumbar Spine Complete     Other   Avitaminosis D    Historically, has taken 50,000 iu on a weekly basis previously Will recheck      Relevant Orders   Vitamin D (25 hydroxy)   Urinary urgency    Unclear if before low back pain started or just worse with low back pain Pt nearly incontinent in office trying to get to restroom  UA -leuks, nitrites, blood.  See low back strain note.  If lumbar films negative, can consider oxybutynin once BP controlled  Relevant Orders   POCT Urinalysis Dipstick (Completed)   DG Lumbar Spine Complete   Other Visit Diagnoses     Morbid obesity ([place])       Relevant Medications   metFORMIN (GLUCOPHAGE) 500 MG tablet   Other Relevant Orders   Lipid Profile   Comprehensive Metabolic Panel (CMET)   Encounter for screening for malignant neoplasm of colon       Relevant Orders   Cologuard        Follow-up: Return in about 3 months (around 11/25/2021) for hypertension, nurse visit for bp check 1 mo.    I, [name], PA-C have reviewed all documentation for this visit. The documentation on  08/25/2021 for the exam, diagnosis, procedures, and orders are all accurate and complete.  [name], PA-C [facility] [address] [place], [place], [address] Office: [phone] Fax: [phone]

## 2021-08-25 ENCOUNTER — Other Ambulatory Visit: Payer: Self-pay

## 2021-08-25 ENCOUNTER — Ambulatory Visit (INDEPENDENT_AMBULATORY_CARE_PROVIDER_SITE_OTHER): Payer: Medicare Other | Admitting: Physician Assistant

## 2021-08-25 ENCOUNTER — Encounter: Payer: Self-pay | Admitting: Physician Assistant

## 2021-08-25 VITALS — BP 215/94 | HR 76 | Ht 62.0 in | Wt 289.5 lb

## 2021-08-25 DIAGNOSIS — E559 Vitamin D deficiency, unspecified: Secondary | ICD-10-CM

## 2021-08-25 DIAGNOSIS — E119 Type 2 diabetes mellitus without complications: Secondary | ICD-10-CM | POA: Diagnosis not present

## 2021-08-25 DIAGNOSIS — R3915 Urgency of urination: Secondary | ICD-10-CM | POA: Diagnosis not present

## 2021-08-25 DIAGNOSIS — S39012A Strain of muscle, fascia and tendon of lower back, initial encounter: Secondary | ICD-10-CM

## 2021-08-25 DIAGNOSIS — E1159 Type 2 diabetes mellitus with other circulatory complications: Secondary | ICD-10-CM | POA: Diagnosis not present

## 2021-08-25 DIAGNOSIS — Z1211 Encounter for screening for malignant neoplasm of colon: Secondary | ICD-10-CM

## 2021-08-25 DIAGNOSIS — I152 Hypertension secondary to endocrine disorders: Secondary | ICD-10-CM

## 2021-08-25 LAB — POCT GLYCOSYLATED HEMOGLOBIN (HGB A1C): Hemoglobin A1C: 6.1 % — AB (ref 4.0–5.6)

## 2021-08-25 LAB — POCT URINALYSIS DIPSTICK
Bilirubin, UA: NEGATIVE
Blood, UA: NEGATIVE
Glucose, UA: NEGATIVE
Ketones, UA: NEGATIVE
Leukocytes, UA: NEGATIVE
Nitrite, UA: NEGATIVE
Protein, UA: POSITIVE — AB
Spec Grav, UA: 1.01 (ref 1.010–1.025)
Urobilinogen, UA: 0.2 E.U./dL
pH, UA: 8.5 — AB (ref 5.0–8.0)

## 2021-08-25 MED ORDER — AMLODIPINE BESYLATE 10 MG PO TABS: 10.0000 mg | ORAL_TABLET | Freq: Every day | ORAL | 1 refills | Status: DC

## 2021-08-25 MED ORDER — ASPIRIN 81 MG PO TBEC: 81.0000 mg | DELAYED_RELEASE_TABLET | Freq: Every day | ORAL | 3 refills | Status: DC

## 2021-08-25 MED ORDER — LOSARTAN POTASSIUM-HCTZ 100-25 MG PO TABS: ORAL_TABLET | ORAL | 1 refills | Status: DC

## 2021-08-25 MED ORDER — METFORMIN HCL 500 MG PO TABS: ORAL_TABLET | ORAL | 1 refills | Status: DC

## 2021-08-25 NOTE — Patient Instructions (Addendum)
Td (Tetanus, Diphtheria) Vaccine: What You Need to Know 1. Why get vaccinated? Td vaccine can prevent tetanus and diphtheria. Tetanus enters the body through cuts or wounds. Diphtheria spreads from person to person. TETANUS (T) causes painful stiffening of the muscles. Tetanus can lead to serious health problems, including being unable to open the mouth, having trouble swallowing and breathing, or death. DIPHTHERIA (D) can lead to difficulty breathing, heart failure, paralysis, or death. 2. Td vaccine Td is only for children 7 years and older, adolescents, and adults.  Td is usually given as a booster dose every 10 years, or after 5 years in the case of a severe or dirty wound or burn. Another vaccine, called "Tdap," may be used instead of Td. Tdap protects against pertussis, also known as "whooping cough," in addition to tetanus and diphtheria. Td may be given at the same time as other vaccines. 3. Talk with your health care provider Tell your vaccination provider if the person getting the vaccine: Has had an allergic reaction after a previous dose of any vaccine that protects against tetanus or diphtheria, or has any severe, life-threatening allergies Has ever had Guillain-Barr Syndrome (also called "GBS") Has had severe pain or swelling after a previous dose of any vaccine that protects against tetanus or diphtheria In some cases, your health care provider may decide to postpone Td vaccination until a future visit. People with minor illnesses, such as a cold, may be vaccinated. People who are moderately or severely ill should usually wait until they recover before getting Td vaccine.  Your health care provider can give you more information. 4. Risks of a vaccine reaction Pain, redness, or swelling where the shot was given, mild fever, headache, feeling tired, and nausea, vomiting, diarrhea, or stomachache sometimes happen after Td vaccination. People sometimes faint after medical procedures,  including vaccination. Tell your provider if you feel dizzy or have vision changes or ringing in the ears.  As with any medicine, there is a very remote chance of a vaccine causing a severe allergic reaction, other serious injury, or death. 5. What if there is a serious problem? An allergic reaction could occur after the vaccinated person leaves the clinic. If you see signs of a severe allergic reaction (hives, swelling of the face and throat, difficulty breathing, a fast heartbeat, dizziness, or weakness), call 9-1-1 and get the person to the nearest hospital.  For other signs that concern you, call your health care provider.  Adverse reactions should be reported to the Vaccine Adverse Event Reporting System (VAERS). Your health care provider will usually file this report, or you can do it yourself. Visit the VAERS website at www.vaers.SamedayNews.es or call 760-378-2921. VAERS is only for reporting reactions, and VAERS staff members do not give medical advice. 6. The National Vaccine Injury Compensation Program The [employer] Vaccine Injury Compensation Program (VICP) is a federal program that was created to compensate people who may have been injured by certain vaccines. Claims regarding alleged injury or death due to vaccination have a time limit for filing, which may be as short as two years. Visit the VICP website at GoldCloset.com.ee or call (681)013-7038 to learn about the program and about filing a claim. 7. How can I learn more? Ask your health care provider. Call your local or state health department. Visit the website of the Food and Drug Administration (FDA) for vaccine package inserts and additional information at TraderRating.uy. Contact the Centers for Disease Control and Prevention (CDC): Call 519-828-2045 (1-800-CDC-INFO) or Visit  CDC's website at http://hunter.com/. Vaccine Information Statement Td (Tetanus, Diphtheria) Vaccine  (01/31/2020) This information is not intended to replace advice given to you by your health care provider. Make sure you discuss any questions you have with your health care provider. Document Revised: 03/19/2020 Document Reviewed: 03/19/2020 Elsevier Patient Education  [place].  Recombinant Zoster (Shingles) Vaccine: What You Need to Know 1. Why get vaccinated? Recombinant zoster (shingles) vaccine can prevent shingles. Shingles (also called herpes zoster, or just zoster) is a painful skin rash, usually with blisters. In addition to the rash, shingles can cause fever, headache, chills, or upset stomach. Rarely, shingles can lead to complications such as pneumonia, hearing problems, blindness, brain inflammation (encephalitis), or death. The risk of shingles increases with age. The most common complication of shingles is long-term nerve pain called postherpetic neuralgia (PHN). PHN occurs in the areas where the shingles rash was and can last for months or years after the rash goes away. The pain from PHN can be severe and debilitating. The risk of PHN increases with age. An older adult with shingles is more likely to develop PHN and have longer lasting and more severe pain than a younger person. People with weakened immune systems also have a higher risk of getting shingles and complications from the disease. Shingles is caused by varicella-zoster virus, the same virus that causes chickenpox. After you have chickenpox, the virus stays in your body and can cause shingles later in life. Shingles cannot be passed from one person to another, but the virus that causes shingles can spread and cause chickenpox in someone who has never had chickenpox or has never received chickenpox vaccine. 2. Recombinant shingles vaccine Recombinant shingles vaccine provides strong protection against shingles. By preventing shingles, recombinant shingles vaccine also protects against PHN and other  complications. Recombinant shingles vaccine is recommended for: Adults 19 years and older Adults 19 years and older who have a weakened immune system because of disease or treatments Shingles vaccine is given as a two-dose series. For most people, the second dose should be given 2 to 6 months after the first dose. Some people who have or will have a weakened immune system can get the second dose 1 to 2 months after the first dose. Ask your health care provider for guidance. People who have had shingles in the past and people who have received varicella (chickenpox) vaccine are recommended to get recombinant shingles vaccine. The vaccine is also recommended for people who have already gotten another type of shingles vaccine, the live shingles vaccine. There is no live virus in recombinant shingles vaccine. Shingles vaccine may be given at the same time as other vaccines. 3. Talk with your health care provider Tell your vaccination provider if the person getting the vaccine: Has had an allergic reaction after a previous dose of recombinant shingles vaccine, or has any severe, life-threatening allergies Is currently experiencing an episode of shingles Is pregnant In some cases, your health care provider may decide to postpone shingles vaccination until a future visit. People with minor illnesses, such as a cold, may be vaccinated. People who are moderately or severely ill should usually wait until they recover before getting recombinant shingles vaccine. Your health care provider can give you more information. 4. Risks of a vaccine reaction A sore arm with mild or moderate pain is very common after recombinant shingles vaccine. Redness and swelling can also happen at the site of the injection. Tiredness, muscle pain, headache, shivering, fever, stomach pain, and  nausea are common after recombinant shingles vaccine. These side effects may temporarily prevent a vaccinated person from doing regular  activities. Symptoms usually go away on their own in 2 to 3 days. You should still get the second dose of recombinant shingles vaccine even if you had one of these reactions after the first dose. Guillain-Barr syndrome (GBS), a serious nervous system disorder, has been reported very rarely after recombinant zoster vaccine. People sometimes faint after medical procedures, including vaccination. Tell your provider if you feel dizzy or have vision changes or ringing in the ears. As with any medicine, there is a very remote chance of a vaccine causing a severe allergic reaction, other serious injury, or death. 5. What if there is a serious problem? An allergic reaction could occur after the vaccinated person leaves the clinic. If you see signs of a severe allergic reaction (hives, swelling of the face and throat, difficulty breathing, a fast heartbeat, dizziness, or weakness), call 9-1-1 and get the person to the nearest hospital. For other signs that concern you, call your health care provider. Adverse reactions should be reported to the Vaccine Adverse Event Reporting System (VAERS). Your health care provider will usually file this report, or you can do it yourself. Visit the VAERS website at www.vaers.SamedayNews.es or call (607)635-4742. VAERS is only for reporting reactions, and VAERS staff members do not give medical advice. 6. How can I learn more? Ask your health care provider. Call your local or state health department. Visit the website of the Food and Drug Administration (FDA) for vaccine package inserts and additional information at http://lopez-wang.org/. Contact the Centers for Disease Control and Prevention (CDC): Call 380-247-2977 (1-800-CDC-INFO) or Visit CDC's website at http://hunter.com/. Vaccine Information Statement Recombinant Zoster Vaccine (07/31/2020) This information is not intended to replace advice given to you by your health care provider. Make sure you  discuss any questions you have with your health care provider. Document Revised: 02/26/2021 Document Reviewed: 08/14/2020 Elsevier Patient Education  2022 [employer].

## 2021-08-25 NOTE — Assessment & Plan Note (Signed)
Historically, has taken 50,000 iu on a weekly basis previously ?Will recheck ?

## 2021-08-25 NOTE — Assessment & Plan Note (Signed)
Well controlled on metformin 500 mg bid.  ?Will recheck a1c 3 mo as not yet stable. ?--consider switching to ozempic for additional weight loss benefit ?

## 2021-08-25 NOTE — Assessment & Plan Note (Signed)
Continue w/ pain control regimen. ?Referred to physical therapy. ?Unclear if urinary urgency is associated. Low back xray was not completed in ED. ?Lumbar films ordered.  ? ?

## 2021-08-25 NOTE — Assessment & Plan Note (Addendum)
Unclear if before low back pain started or just worse with low back pain ?Pt nearly incontinent in office trying to get to restroom  ?UA -leuks, nitrites, blood.  ?See low back strain note.  ?If lumbar films negative, can consider oxybutynin once BP controlled  ?

## 2021-08-25 NOTE — Assessment & Plan Note (Addendum)
Uncontrolled today. Pt states she is consistent with her medication, just hasn't taken yet today. --When speaking to daughter, she is taking losartan 100/hctz 25, but not amlodipine 10 mg. We will refill this. ?Reports her bp always goes high when she is in pain and she is in pain currently. Repeat was 215/94 after resting for 10 minutes after ambulating to restroom.  ? ?--restart amlodipine 10 mg  ?Advised her to check 2-3 times a week at home, f/u in 1 week for nurse visit to check BP. ?F/u with me 3 mo ? ?

## 2021-08-26 ENCOUNTER — Other Ambulatory Visit: Payer: Self-pay | Admitting: Family Medicine

## 2021-08-26 DIAGNOSIS — E559 Vitamin D deficiency, unspecified: Secondary | ICD-10-CM

## 2021-08-26 LAB — MICROALBUMIN / CREATININE URINE RATIO

## 2021-08-27 NOTE — Telephone Encounter (Signed)
Requested medication (s) are due for refill today: yes ? ?Requested medication (s) are on the active medication list: yes ? ?Last refill:  12/21/20 #12 with 0 RF ? ?Future visit scheduled: 09/01/21 ? ?Notes to clinic:  This med does not have a protocol to follow, please assess. ? ? ?  ? ?Requested Prescriptions  ?Pending Prescriptions Disp Refills  ? Vitamin D, Ergocalciferol, (DRISDOL) 1.25 MG (50000 UNIT) CAPS capsule [Pharmacy Med Name: Vitamin D (Ergocalciferol) 50000 UNIT Oral Capsule] 12 capsule 0  ?  Sig: Take 1 capsule by mouth once a week  ?  ? Endocrinology:  Vitamins - Vitamin D Supplementation 2 Failed - 08/26/2021  5:30 PM  ?  ?  Failed - Manual Review: Route requests for 50,000 IU strength to the provider  ?  ?  Passed - Ca in normal range and within 360 days  ?  Calcium  ?Date Value Ref Range Status  ?08/25/2021 WILL FOLLOW  Preliminary  ? ?Calcium, Total  ?Date Value Ref Range Status  ?10/01/2014 9.5 mg/dL Final  ?  Comment:  ?  8.9-10.3 ?NOTE: New Reference Range ? 09/02/14 ?  ?  ?  ?  ?  Passed - Vitamin D in normal range and within 360 days  ?  Vit D, 25-Hydroxy  ?Date Value Ref Range Status  ?08/25/2021 WILL FOLLOW  Preliminary  ?  ?  ?  ?  Passed - Valid encounter within last 12 months  ?  Recent Outpatient Visits   ? ?      ? 2 days ago Type 2 diabetes mellitus without complication, without long-term current use of insulin ([place])  ? [facility] Thedore Mins, Ria Comment, PA-C  ? 4 months ago Type 2 diabetes mellitus without complication, without long-term current use of insulin ([place])  ? [facility] Jerrol Banana., MD  ? 1 year ago Fall, subsequent encounter  ? [place], [place]  ? 1 year ago Acute pain of right knee  ? [place] Fl[place], Kelby Aline, FNP  ? 1 year ago No-show for appointment  ? [place], FNP  ? ?  ?  ?Future Appointments   ? ?        ? In 3 months Drubel, Delman Cheadle [facility], [facility]  ? ?  ? ?  ?  ?  ? ? ?

## 2021-08-29 LAB — LIPID PANEL

## 2021-08-29 LAB — COMPREHENSIVE METABOLIC PANEL

## 2021-09-01 ENCOUNTER — Ambulatory Visit
Admission: RE | Admit: 2021-09-01 | Discharge: 2021-09-01 | Disposition: A | Discharge status: Home / Self Care | Payer: Medicare Other | Attending: Physician Assistant | Admitting: Physician Assistant

## 2021-09-01 ENCOUNTER — Ambulatory Visit
Admission: RE | Admit: 2021-09-01 | Discharge: 2021-09-01 | Disposition: A | Discharge status: Home / Self Care | Payer: Medicare Other | Source: Ambulatory Visit | Attending: Physician Assistant | Admitting: Physician Assistant

## 2021-09-01 ENCOUNTER — Ambulatory Visit: Payer: Medicare Other | Admitting: Physician Assistant

## 2021-09-01 ENCOUNTER — Other Ambulatory Visit: Payer: Self-pay

## 2021-09-01 VITALS — BP 145/71

## 2021-09-01 DIAGNOSIS — E7800 Pure hypercholesterolemia, unspecified: Secondary | ICD-10-CM

## 2021-09-01 DIAGNOSIS — I152 Hypertension secondary to endocrine disorders: Secondary | ICD-10-CM

## 2021-09-01 DIAGNOSIS — E119 Type 2 diabetes mellitus without complications: Secondary | ICD-10-CM

## 2021-09-01 DIAGNOSIS — E1159 Type 2 diabetes mellitus with other circulatory complications: Secondary | ICD-10-CM

## 2021-09-01 DIAGNOSIS — E559 Vitamin D deficiency, unspecified: Secondary | ICD-10-CM

## 2021-09-01 DIAGNOSIS — R3915 Urgency of urination: Secondary | ICD-10-CM | POA: Diagnosis present

## 2021-09-01 DIAGNOSIS — S39012A Strain of muscle, fascia and tendon of lower back, initial encounter: Secondary | ICD-10-CM | POA: Insufficient documentation

## 2021-09-02 ENCOUNTER — Other Ambulatory Visit: Payer: Self-pay | Admitting: Physician Assistant

## 2021-09-02 DIAGNOSIS — N3941 Urge incontinence: Secondary | ICD-10-CM

## 2021-09-02 LAB — MICROALBUMIN / CREATININE URINE RATIO
Creatinine, Urine: 87.9 mg/dL
Microalb/Creat Ratio: 23 mg/g creat (ref 0–29)
Microalbumin, Urine: 20.4 ug/mL

## 2021-09-02 LAB — COMPREHENSIVE METABOLIC PANEL
ALT: 22 IU/L (ref 0–32)
AST: 21 IU/L (ref 0–40)
Albumin/Globulin Ratio: 1.3 (ref 1.2–2.2)
Albumin: 4.2 g/dL (ref 3.8–4.8)
Alkaline Phosphatase: 65 IU/L (ref 44–121)
BUN/Creatinine Ratio: 22 (ref 12–28)
BUN: 16 mg/dL (ref 8–27)
Bilirubin Total: 0.7 mg/dL (ref 0.0–1.2)
CO2: 26 mmol/L (ref 20–29)
Calcium: 9.7 mg/dL (ref 8.7–10.3)
Chloride: 101 mmol/L (ref 96–106)
Creatinine, Ser: 0.73 mg/dL (ref 0.57–1.00)
Globulin, Total: 3.2 g/dL (ref 1.5–4.5)
Glucose: 103 mg/dL — ABNORMAL HIGH (ref 70–99)
Potassium: 4 mmol/L (ref 3.5–5.2)
Sodium: 143 mmol/L (ref 134–144)
Total Protein: 7.4 g/dL (ref 6.0–8.5)
eGFR: 90 mL/min/{1.73_m2} (ref >59)

## 2021-09-02 LAB — LIPID PANEL
Chol/HDL Ratio: 4.9 ratio — ABNORMAL HIGH (ref 0.0–4.4)
Cholesterol, Total: 167 mg/dL (ref 100–199)
HDL: 34 mg/dL — ABNORMAL LOW (ref >39)
LDL Chol Calc (NIH): 110 mg/dL — ABNORMAL HIGH (ref 0–99)
Triglycerides: 124 mg/dL (ref 0–149)
VLDL Cholesterol Cal: 23 mg/dL (ref 5–40)

## 2021-09-02 LAB — VITAMIN D 25 HYDROXY (VIT D DEFICIENCY, FRACTURES): Vit D, 25-Hydroxy: 28.3 ng/mL — ABNORMAL LOW (ref 30.0–100.0)

## 2021-09-02 MED ORDER — OXYBUTYNIN CHLORIDE 5 MG PO TABS: 5.0000 mg | ORAL_TABLET | Freq: Two times a day (BID) | ORAL | 1 refills | Status: DC

## 2021-09-15 ENCOUNTER — Ambulatory Visit: Payer: Medicare Other | Admitting: Physician Assistant

## 2021-09-15 LAB — COMPREHENSIVE METABOLIC PANEL

## 2021-09-15 LAB — LIPID PANEL

## 2021-09-15 LAB — MICROALBUMIN / CREATININE URINE RATIO

## 2021-09-15 LAB — VITAMIN D 25 HYDROXY (VIT D DEFICIENCY, FRACTURES)

## 2021-11-02 ENCOUNTER — Telehealth: Payer: Self-pay | Admitting: Physician Assistant

## 2021-11-02 NOTE — Telephone Encounter (Signed)
Left message for patient to call back and schedule Medicare Annual Wellness Visit (AWV) in office.  ? ?If unable to come into the office for AWV,  please offer to do virtually or by telephone. ? ?Last AWV: 06/30/2020 ? ?Please schedule at anytime with [facility] Health Advisor. ? ?30 minute appointment for Virtual or phone ?45 minute appointment for in office or Initial virtual/phone ? ?Any questions, please contact me at [phone]   ?

## 2021-11-24 NOTE — Progress Notes (Deleted)
I,Sha'taria Deauna Yaw,acting as a Education administrator for [employer], PA-C.,have documented all relevant documentation on the behalf of [name], PA-C,as directed by  [name], PA-C while in the presence of [name], PA-C.   Established patient visit   Patient: Kristin Mora   DOB: 05/20/1956   66 y.o. Female  MRN: [id number] Visit Date: 11/25/2021  Today's healthcare provider: [name], PA-C   No chief complaint on file.  Subjective    HPI  Hypertension, follow-up  BP Readings from Last 3 Encounters:  09/01/21 (!) 145/71  08/25/21 (!) 215/94  08/15/21 (!) 185/81   Wt Readings from Last 3 Encounters:  08/25/21 289 lb 8 oz (131.3 kg)  08/15/21 292 lb 1.8 oz (132.5 kg)  04/27/21 292 lb (132.5 kg)     She was last seen for hypertension 3 months ago.  BP at that visit was 145/71. Management since that visit includes restart amlodipine 10 mg .  She reports {excellent/good/fair/poor:19665} compliance with treatment. She {is/is not:9024} having side effects. {document side effects if present:1} She is following a {diet:21022986} diet. She {is/is not:9024} exercising. She {does/does not:200015} smoke.  Use of agents associated with hypertension: {bp agents assoc with hypertension:511::"none"}.   Outside blood pressures are {***enter patient reported home BP readings, or 'not being checked':1}. Symptoms: {Yes/No:20286} chest pain {Yes/No:20286} chest pressure  {Yes/No:20286} palpitations {Yes/No:20286} syncope  {Yes/No:20286} dyspnea {Yes/No:20286} orthopnea  {Yes/No:20286} paroxysmal nocturnal dyspnea {Yes/No:20286} lower extremity edema   Pertinent labs Lab Results  Component Value Date   CHOL 167 09/01/2021   HDL 34 (L) 09/01/2021   LDLCALC 110 (H) 09/01/2021   TRIG 124 09/01/2021   CHOLHDL 4.9 (H) 09/01/2021   Lab Results  Component Value Date   NA 143 09/01/2021   K 4.0 09/01/2021   CREATININE 0.73 09/01/2021   EGFR 90 09/01/2021   GLUCOSE 103  (H) 09/01/2021   TSH 1.870 07/27/2020     The 10-year ASCVD risk score (Arnett DK, et al., 2019) is: 45.8%  ---------------------------------------------------------------------------------------------------  Diabetes Mellitus Type II, Follow-up  Lab Results  Component Value Date   HGBA1C 6.1 (A) 08/25/2021   HGBA1C 6.9 (H) 07/27/2020   HGBA1C 6.7 (A) 04/10/2020   Wt Readings from Last 3 Encounters:  08/25/21 289 lb 8 oz (131.3 kg)  08/15/21 292 lb 1.8 oz (132.5 kg)  04/27/21 292 lb (132.5 kg)   Last seen for diabetes 3 months ago.  Management since then includes  metformin 500 mg bid and consider switching to ozempic for additional weight loss benefit. She reports {excellent/good/fair/poor:19665} compliance with treatment. She {is/is not:21021397} having side effects. {document side effects if present:1} Symptoms: {Yes/No:20286} fatigue {Yes/No:20286} foot ulcerations  {Yes/No:20286} appetite changes {Yes/No:20286} nausea  {Yes/No:20286} paresthesia of the feet  {Yes/No:20286} polydipsia  {Yes/No:20286} polyuria {Yes/No:20286} visual disturbances   {Yes/No:20286} vomiting     Home blood sugar records: {diabetes glucometry results:16657}  Episodes of hypoglycemia? {Yes/No:20286} {enter symptoms and frequency of symptoms if yes:1}   Current insulin regiment: {enter 'none' or type of insulin and number of units taken with each dose of each insulin formulation that the patient is taking:1} Most Recent Eye Exam: *** {Current exercise:16438:::1} {Current diet habits:16563:::1}  Pertinent Labs: Lab Results  Component Value Date   CHOL 167 09/01/2021   HDL 34 (L) 09/01/2021   LDLCALC 110 (H) 09/01/2021   TRIG 124 09/01/2021   CHOLHDL 4.9 (H) 09/01/2021   Lab Results  Component Value Date   NA 143 09/01/2021   K  4.0 09/01/2021   CREATININE 0.73 09/01/2021   EGFR 90 09/01/2021   LABMICR 20.4 09/01/2021      ---------------------------------------------------------------------------------------------------   Medications: Outpatient Medications Prior to Visit  Medication Sig   amLODipine (NORVASC) 10 MG tablet Take 1 tablet (10 mg total) by mouth daily.   aspirin 81 MG EC tablet Take 1 tablet (81 mg total) by mouth daily. Swallow whole.   atorvastatin (LIPITOR) 80 MG tablet Take 1 tablet by mouth once daily   clotrimazole-betamethasone (LOTRISONE) cream APPLY  CREAM TOPICALLY TWICE DAILY   ibuprofen (ADVIL) 600 MG tablet Take 1 tablet (600 mg total) by mouth every 8 (eight) hours as needed for moderate pain.   lidocaine (LIDODERM) 5 % Place 1 patch onto the skin daily. Remove & Discard patch within 12 hours or as directed by MD   losartan-hydrochlorothiazide (HYZAAR) 100-25 MG tablet TAKE 1 TABLET BY MOUTH ONCE DAILY. NEEDS TO BE SEEN FOR FURTHER REFILLS.   metFORMIN (GLUCOPHAGE) 500 MG tablet TAKE 1 TABLET BY MOUTH TWICE DAILY WITH A MEAL.   oxybutynin (DITROPAN) 5 MG tablet Take 1 tablet (5 mg total) by mouth 2 (two) times daily.   Vitamin D, Ergocalciferol, (DRISDOL) 1.25 MG (50000 UNIT) CAPS capsule Take 1 capsule by mouth once a week   No facility-administered medications prior to visit.    Review of Systems  {Labs  Heme  Chem  Endocrine  Serology  Results Review (optional):23779}   Objective    There were no vitals taken for this visit. {Show previous vital signs (optional):23777}  Physical Exam  ***  No results found for any visits on 11/25/21.  Assessment & Plan     ***  No follow-ups on file.      {provider attestation***:1}   [name], PA-C  [facility] [phone] (phone) [phone] (fax)  [place]

## 2021-11-25 ENCOUNTER — Ambulatory Visit: Payer: Medicare Other | Admitting: Physician Assistant

## 2021-11-29 ENCOUNTER — Ambulatory Visit: Payer: Self-pay

## 2021-11-29 NOTE — Telephone Encounter (Signed)
Pt is calling because she has a rash under her Right arm. Pt would like to know what was the name of the cream that was prescribed to her for the itching   Chief Complaint: "I have a red itchy rash I had before on my right arm and breast. The Lotrisone cream cleared it up." Asking for refill. Symptoms: Itching Frequency: Last week Pertinent Negatives: Patient denies fever Disposition: '[]'$ ED /'[]'$ Urgent Care (no appt availability in office) / '[]'$ Appointment(In office/virtual)/ '[]'$  [place] [facility]/ '[]'$ Home Care/ '[]'$ Refused Recommended Disposition /'[]'$ [place] Mobile [facility]/ '[x]'$  Follow-up with PCP Additional Notes: Please advise pt.  Answer Assessment - Initial Assessment Questions 1. APPEARANCE of RASH: "Describe the rash."      Skin is red 2. LOCATION: "Where is the rash located?"      Right arm and breast 3. NUMBER: "How many spots are there?"      3 4. SIZE: "How big are the spots?" (Inches, centimeters or compare to size of a coin)      Hand size 5. ONSET: "When did the rash start?"      Last week 6. ITCHING: "Does the rash itch?" If Yes, ask: "How bad is the itch?"  (Scale 0-10; or none, mild, moderate, severe)     Moderate 7. PAIN: "Does the rash hurt?" If Yes, ask: "How bad is the pain?"  (Scale 0-10; or none, mild, moderate, severe)    - NONE (0): no pain    - MILD (1-3): doesn't interfere with normal activities     - MODERATE (4-7): interferes with normal activities or awakens from sleep     - SEVERE (8-10): excruciating pain, unable to do any normal activities     Mild 8. OTHER SYMPTOMS: "Do you have any other symptoms?" (e.g., fever)     No 9. PREGNANCY: "Is there any chance you are pregnant?" "When was your last menstrual period?"     No  Protocols used: Rash or Redness - Localized-A-AH

## 2021-11-29 NOTE — Progress Notes (Unsigned)
I,Sha'taria Tyson,acting as a Education administrator for [employer], PA-C.,have documented all relevant documentation on the behalf of [name], PA-C,as directed by  [name], PA-C while in the presence of [name], PA-C.  Established patient visit   Patient: Kristin Mora   DOB: [birth year]   66 y.o. Female  MRN: [id number] Visit Date: 11/30/2021  Today's healthcare provider: [name], PA-C   No chief complaint on file.  Subjective    HPI  Hypertension, follow-up  BP Readings from Last 3 Encounters:  09/01/21 (!) 145/71  08/25/21 (!) 215/94  08/15/21 (!) 185/81   Wt Readings from Last 3 Encounters:  08/25/21 289 lb 8 oz (131.3 kg)  08/15/21 292 lb 1.8 oz (132.5 kg)  04/27/21 292 lb (132.5 kg)     She was last seen for hypertension {NUMBERS 1-12:18279} {days/wks/mos/yrs:310907} ago.  BP at that visit was ***. Management since that visit includes ***.  She reports {excellent/good/fair/poor:19665} compliance with treatment. She {is/is not:9024} having side effects. {document side effects if present:1} She is following a {diet:21022986} diet. She {is/is not:9024} exercising. She {does/does not:200015} smoke.  Use of agents associated with hypertension: {bp agents assoc with hypertension:511::"none"}.   Outside blood pressures are {***enter patient reported home BP readings, or 'not being checked':1}. Symptoms: {Yes/No:20286} chest pain {Yes/No:20286} chest pressure  {Yes/No:20286} palpitations {Yes/No:20286} syncope  {Yes/No:20286} dyspnea {Yes/No:20286} orthopnea  {Yes/No:20286} paroxysmal nocturnal dyspnea {Yes/No:20286} lower extremity edema   Pertinent labs Lab Results  Component Value Date   CHOL 167 09/01/2021   HDL 34 (L) 09/01/2021   LDLCALC 110 (H) 09/01/2021   TRIG 124 09/01/2021   CHOLHDL 4.9 (H) 09/01/2021   Lab Results  Component Value Date   NA 143 09/01/2021   K 4.0 09/01/2021   CREATININE 0.73 09/01/2021   EGFR 90 09/01/2021    GLUCOSE 103 (H) 09/01/2021   TSH 1.870 07/27/2020     The 10-year ASCVD risk score (Arnett DK, et al., 2019) is: 45.8%  --------------------------------------------------------------------------------------------------- Rash: Patient complains of rash involving the {body location:12896}. Rash started {numbers; 0-10:33138} {time units w/ wo plural:11} ago. Appearance of rash at onset: {rash appear:16498}. Rash {has/not:18111} changed over time Initial distribution: {body location:12896}.  Discomfort associated with rash: {rash dc:16501}.  Associated symptoms: {rash assoc:16502}. Denies: {rash assoc:16502}. Patient {has/not:18111} had previous evaluation of rash. Patient {has/not:18111} had previous treatment.  Response to treatment: ***. Patient {has/not:18111} had contacts with similar rash. Patient {has/not:18111} identified precipitant. Patient {has/not:18111} had new exposures (soaps, lotions, laundry detergents, foods, medications, plants, insects or animals.)   Medications: Outpatient Medications Prior to Visit  Medication Sig   amLODipine (NORVASC) 10 MG tablet Take 1 tablet (10 mg total) by mouth daily.   aspirin 81 MG EC tablet Take 1 tablet (81 mg total) by mouth daily. Swallow whole.   atorvastatin (LIPITOR) 80 MG tablet Take 1 tablet by mouth once daily   clotrimazole-betamethasone (LOTRISONE) cream APPLY  CREAM TOPICALLY TWICE DAILY   ibuprofen (ADVIL) 600 MG tablet Take 1 tablet (600 mg total) by mouth every 8 (eight) hours as needed for moderate pain.   lidocaine (LIDODERM) 5 % Place 1 patch onto the skin daily. Remove & Discard patch within 12 hours or as directed by MD   losartan-hydrochlorothiazide (HYZAAR) 100-25 MG tablet TAKE 1 TABLET BY MOUTH ONCE DAILY. NEEDS TO BE SEEN FOR FURTHER REFILLS.   metFORMIN (GLUCOPHAGE) 500 MG tablet TAKE 1 TABLET BY MOUTH TWICE DAILY WITH A MEAL.   oxybutynin (DITROPAN) 5 MG tablet  Take 1 tablet (5 mg total) by mouth 2 (two) times daily.    Vitamin D, Ergocalciferol, (DRISDOL) 1.25 MG (50000 UNIT) CAPS capsule Take 1 capsule by mouth once a week   No facility-administered medications prior to visit.    Review of Systems  {Labs  Heme  Chem  Endocrine  Serology  Results Review (optional):23779}   Objective    There were no vitals taken for this visit. {Show previous vital signs (optional):23777}  Physical Exam  ***  No results found for any visits on 11/30/21.  Assessment & Plan     ***  No follow-ups on file.      {provider attestation***:1}   [name], PA-C  [facility] [phone] (phone) [phone] (fax)  [place]

## 2021-11-30 ENCOUNTER — Ambulatory Visit (INDEPENDENT_AMBULATORY_CARE_PROVIDER_SITE_OTHER): Payer: Medicare Other | Admitting: Physician Assistant

## 2021-11-30 ENCOUNTER — Encounter: Payer: Self-pay | Admitting: Physician Assistant

## 2021-11-30 VITALS — BP 209/87 | HR 85 | Ht 62.0 in | Wt 281.0 lb

## 2021-11-30 DIAGNOSIS — I152 Hypertension secondary to endocrine disorders: Secondary | ICD-10-CM | POA: Diagnosis not present

## 2021-11-30 DIAGNOSIS — L304 Erythema intertrigo: Secondary | ICD-10-CM

## 2021-11-30 DIAGNOSIS — E1159 Type 2 diabetes mellitus with other circulatory complications: Secondary | ICD-10-CM | POA: Diagnosis not present

## 2021-11-30 MED ORDER — NYSTATIN 100000 UNIT/GM EX POWD: 1.0000 "application " | Freq: Three times a day (TID) | CUTANEOUS | 1 refills | Status: DC | PRN

## 2021-11-30 MED ORDER — BLOOD PRESSURE DIGITAL SOLN KIT: PACK | 0 refills | Status: DC

## 2021-11-30 MED ORDER — NYSTATIN 100000 UNIT/GM EX OINT: 1.0000 "application " | TOPICAL_OINTMENT | Freq: Two times a day (BID) | CUTANEOUS | 1 refills | Status: DC

## 2021-11-30 NOTE — Assessment & Plan Note (Signed)
Severe.  Advised no deodorant ; rx nystatin ointment to use BID, and if she knows she is going out, will sweat, can use nystatin powder up to TID. When resolved, can use powder PRN.  If no improvement in 1-2 weeks, would refer to derm

## 2021-11-30 NOTE — Assessment & Plan Note (Addendum)
Again uncontrolled despite pt reporting she is being consistent with medication. Pressures are equal bilateral arms. No symptoms of headache, dizziness, syncope, changes in vision. Strict ED precautions Advised risks of very high blood pressure Urgent referral to cardiology for assistance w/ htn management

## 2021-12-21 ENCOUNTER — Telehealth: Payer: Self-pay | Admitting: Physician Assistant

## 2021-12-24 ENCOUNTER — Other Ambulatory Visit: Payer: Self-pay | Admitting: *Deleted

## 2021-12-24 ENCOUNTER — Other Ambulatory Visit: Payer: Self-pay | Admitting: Physician Assistant

## 2021-12-24 ENCOUNTER — Ambulatory Visit: Payer: Self-pay | Admitting: *Deleted

## 2021-12-24 DIAGNOSIS — L304 Erythema intertrigo: Secondary | ICD-10-CM

## 2021-12-24 NOTE — Telephone Encounter (Signed)
LMTCB-Ok for [facility] nurse to give providers message.

## 2021-12-24 NOTE — Telephone Encounter (Signed)
Summary: Rash advice   Pt was seen in office 3 weeks ago for rash under her arm. Pt reports that the rash is not getting any better. Please advise        Chief Complaint: rash under right arm not getting better Symptoms: redness remains and itching. Has been applying nystatin ointment and helps but has not gotten rid of rash. Patient did not receive nystatin powder as recommended per OV note on 11/30/21. Requesting nystatin powder to be sent to pharmacy  Frequency: na Pertinent Negatives: Patient denies na Disposition: '[]'$ ED /'[]'$ Urgent Care (no appt availability in office) / '[]'$ Appointment(In office/virtual)/ '[]'$  [place] [facility]/ '[]'$ Home Care/ '[]'$ Refused Recommended Disposition /'[]'$ [place] [facility]/ '[x]'$  Follow-up with PCP Additional Notes:   Please send another Rx for nystatin powder due to patient did not receive from pharmacy.      Reason for Disposition  Mild localized rash  Answer Assessment - Initial Assessment Questions 1. APPEARANCE of RASH: "Describe the rash."      Red under right arm 2. LOCATION: "Where is the rash located?"      Right under arm  3. NUMBER: "How many spots are there?"      na 4. SIZE: "How big are the spots?" (Inches, centimeters or compare to size of a coin)      na 5. ONSET: "When did the rash start?"      Has been seen by PCP 6. ITCHING: "Does the rash itch?" If Yes, ask: "How bad is the itch?"  (Scale 0-10; or none, mild, moderate, severe)     Yes 7. PAIN: "Does the rash hurt?" If Yes, ask: "How bad is the pain?"  (Scale 0-10; or none, mild, moderate, severe)    - NONE (0): no pain    - MILD (1-3): doesn't interfere with normal activities     - MODERATE (4-7): interferes with normal activities or awakens from sleep     - SEVERE (8-10): excruciating pain, unable to do any normal activities     na 8. OTHER SYMPTOMS: "Do you have any other symptoms?" (e.g., fever)     Not going away  9. PREGNANCY: "Is there any chance you are pregnant?" "When  was your last menstrual period?"     na  Protocols used: Rash or Redness - Localized-A-AH

## 2021-12-24 NOTE — Telephone Encounter (Signed)
Requested medications are due for refill today.  yes  Requested medications are on the active medications list.  yes  Last refill. 11/30/2021 30g 1 refill  Future visit scheduled.   yes  Notes to clinic.  No protocol  - please review for refill.    Requested Prescriptions  Pending Prescriptions Disp Refills   nystatin (MYCOSTATIN/NYSTOP) powder 30 g 1    Sig: Apply 1 Application topically 3 (three) times daily as needed. To right armpit and under b/l breasts     Off-Protocol Failed - 12/24/2021  3:02 PM      Failed - Medication not assigned to a protocol, review manually.      Passed - Valid encounter within last 12 months    Recent Outpatient Visits           3 weeks ago Hypertension associated with diabetes [facility])   [facility] [name], PA-C   4 months ago Type 2 diabetes mellitus without complication, without long-term current use of insulin [facility])   [facility] Thedore Mins, [place], PA-C   8 months ago Type 2 diabetes mellitus without complication, without long-term current use of insulin [facility])   [facility] Jerrol Banana., MD   1 year ago Fall, subsequent encounter   [place], [place]   1 year ago Acute pain of right knee   [place], Kelby Aline, FNP       Future Appointments             In 1 week Ag[place]-Et[place], Aaron Edelman, MD [facility]   In 2 weeks [name], PA-C [employer] [employer], [facility]

## 2021-12-24 NOTE — Progress Notes (Signed)
Severe intertrigo/fungal infection not responding to topical therapy

## 2021-12-29 ENCOUNTER — Encounter: Payer: Self-pay | Admitting: Physician Assistant

## 2021-12-29 ENCOUNTER — Ambulatory Visit (INDEPENDENT_AMBULATORY_CARE_PROVIDER_SITE_OTHER): Payer: Medicare Other | Admitting: Physician Assistant

## 2021-12-29 VITALS — BP 142/80 | HR 72 | Ht 62.0 in | Wt 281.5 lb

## 2021-12-29 DIAGNOSIS — Z1211 Encounter for screening for malignant neoplasm of colon: Secondary | ICD-10-CM | POA: Diagnosis not present

## 2021-12-29 DIAGNOSIS — E1159 Type 2 diabetes mellitus with other circulatory complications: Secondary | ICD-10-CM

## 2021-12-29 DIAGNOSIS — I152 Hypertension secondary to endocrine disorders: Secondary | ICD-10-CM

## 2021-12-29 DIAGNOSIS — L304 Erythema intertrigo: Secondary | ICD-10-CM | POA: Diagnosis not present

## 2021-12-29 DIAGNOSIS — Z1231 Encounter for screening mammogram for malignant neoplasm of breast: Secondary | ICD-10-CM

## 2021-12-29 MED ORDER — KETOCONAZOLE 2 % EX CREA: 1.0000 | TOPICAL_CREAM | Freq: Every day | CUTANEOUS | 2 refills | Status: AC

## 2021-12-29 MED ORDER — BLOOD PRESSURE DIGITAL SOLN KIT: PACK | 0 refills | Status: AC

## 2021-12-29 MED ORDER — HYDROCORTISONE 1 % EX LOTN: 1.0000 | TOPICAL_LOTION | Freq: Two times a day (BID) | CUTANEOUS | 1 refills | Status: DC

## 2021-12-29 MED ORDER — LOSARTAN POTASSIUM-HCTZ 100-25 MG PO TABS: ORAL_TABLET | ORAL | 1 refills | Status: DC

## 2021-12-29 NOTE — Assessment & Plan Note (Signed)
No improvement. Already referred to derm for assistance. Switch to ketoconazole topically w/ hydrocortisone for itching. Advised pt and daughter to monitor for worsening. At this time still does not appear to have an overlying bacterial infection

## 2021-12-29 NOTE — Progress Notes (Signed)
I,Sha'taria Tyson,acting as a Education administrator for [employer], PA-C.,have documented all relevant documentation on the behalf of Kristin Kirschner, PA-C,as directed by  Kristin Kirschner, PA-C while in the presence of Kristin Kirschner, PA-C.  Established patient visit   Patient: Kristin Mora   DOB: Jul 16, 1955   66 y.o. Female  MRN: [id number] Visit Date: 12/29/2021  Today's healthcare provider: [name], PA-C   Cc. Rash f/u  Subjective    HPI   Follow up for rash  The patient was last seen for this 1 months ago. Changes made at last visit include advised no deodorant ; rx nystatin ointment to use BID, and if she knows she is going out, will sweat, can use nystatin powder up to TID. If no improvement in 1-2 weeks, would refer to derm  She reports excellent compliance with treatment. She feels that condition is  but seems to be spreading . She is not having side effects.   Denies pain, denies edema, denies change in ROM of arm, denies fevers. -----------------------------------------------------------------------------------------   Medications: Outpatient Medications Prior to Visit  Medication Sig   amLODipine (NORVASC) 10 MG tablet Take 1 tablet (10 mg total) by mouth daily.   aspirin 81 MG EC tablet Take 1 tablet (81 mg total) by mouth daily. Swallow whole.   atorvastatin (LIPITOR) 80 MG tablet Take 1 tablet by mouth once daily   Blood Pressure Monitoring (BLOOD PRESSURE DIGITAL SOLN) KIT Dispense one automatic/digital blood pressure machine; check blood pressure twice daily in AM and PM   ibuprofen (ADVIL) 600 MG tablet Take 1 tablet (600 mg total) by mouth every 8 (eight) hours as needed for moderate pain.   lidocaine (LIDODERM) 5 % Place 1 patch onto the skin daily. Remove & Discard patch within 12 hours or as directed by MD   metFORMIN (GLUCOPHAGE) 500 MG tablet TAKE 1 TABLET BY MOUTH TWICE DAILY WITH A MEAL.   nystatin (MYCOSTATIN/NYSTOP) powder Apply 1 application.  topically 3 (three) times daily as needed. To right armpit and under b/l breasts   oxybutynin (DITROPAN) 5 MG tablet Take 1 tablet (5 mg total) by mouth 2 (two) times daily.   Vitamin D, Ergocalciferol, (DRISDOL) 1.25 MG (50000 UNIT) CAPS capsule Take 1 capsule by mouth once a week   [DISCONTINUED] clotrimazole-betamethasone (LOTRISONE) cream APPLY  CREAM TOPICALLY TWICE DAILY   [DISCONTINUED] losartan-hydrochlorothiazide (HYZAAR) 100-25 MG tablet TAKE 1 TABLET BY MOUTH ONCE DAILY. NEEDS TO BE SEEN FOR FURTHER REFILLS.   [DISCONTINUED] nystatin ointment (MYCOSTATIN) Apply 1 application. topically 2 (two) times daily. To right armpit and under bilateral breasts   No facility-administered medications prior to visit.    Review of Systems  Constitutional:  Negative for fatigue and fever.  Respiratory:  Negative for cough and shortness of breath.   Cardiovascular:  Negative for chest pain and leg swelling.  Gastrointestinal:  Negative for abdominal pain.  Skin:  Positive for rash.  Neurological:  Negative for dizziness and headaches.       Objective    Blood pressure (!) 153/85, pulse 83, height _0  (1.575 m), weight 281 lb 8 oz (127.7 kg), SpO2 100 %.   Physical Exam Vitals reviewed.  Constitutional:      Appearance: She is not ill-appearing.  HENT:     Head: Normocephalic.  Eyes:     Conjunctiva/sclera: Conjunctivae normal.  Cardiovascular:     Rate and Rhythm: Normal rate.  Pulmonary:     Effort: Pulmonary effort is normal. No respiratory  distress.  Skin:         Comments: R axilla extending to underneath R breast w/ scattered papules across R side of abdomen there is an erythematous rash w/ satellite papules   Neurological:     General: No focal deficit present.     Mental Status: She is alert and oriented to person, place, and time.  Psychiatric:        Mood and Affect: Mood normal.        Behavior: Behavior normal.     No results found for any visits on 12/29/21.   Assessment & Plan     Problem List Items Addressed This Visit       Cardiovascular and Mediastinum   Hypertension associated with diabetes ([place])    Improved but still elevated. Continue current medications. Pt has upcoming cardiologist appt for assistance w/ htn control and cardiac eval.       Relevant Medications   losartan-hydrochlorothiazide (HYZAAR) 100-25 MG tablet     Musculoskeletal and Integument   Intertrigo - Primary    No improvement. Already referred to derm for assistance. Switch to ketoconazole topically w/ hydrocortisone for itching. Advised pt and daughter to monitor for worsening. At this time still does not appear to have an overlying bacterial infection      Relevant Medications   ketoconazole (NIZORAL) 2 % cream   hydrocortisone 1 % lotion   Other Visit Diagnoses     Colon cancer screening       Relevant Orders   Ambulatory referral to Gastroenterology   Breast cancer screening by mammogram       Relevant Orders   MM 3D SCREEN BREAST BILATERAL        Return in about 8 weeks (around 02/23/2022) for DMII, hypertension.      I, Kristin Kirschner, PA-C have reviewed all documentation for this visit. The documentation on  12/29/2021 for the exam, diagnosis, procedures, and orders are all accurate and complete.  Kristin Kirschner, PA-C [facility] [address] [place], [place], [address] Office: [phone] Fax: [place]

## 2021-12-29 NOTE — Assessment & Plan Note (Signed)
Improved but still elevated. Continue current medications. Pt has upcoming cardiologist appt for assistance w/ htn control and cardiac eval.

## 2021-12-29 NOTE — Patient Instructions (Signed)
Please contact [phone] to schedule your mammogram. They will ask which location you prefer to be seen in. You have two options listed below.  1) [facility] located at [address] [place], [place] [address] 2) [facility] [place] located at [address] [place], [place] [address]  Please feel free to contact us if you have any further questions or concerns  

## 2021-12-30 ENCOUNTER — Other Ambulatory Visit: Payer: Self-pay

## 2021-12-30 DIAGNOSIS — Z1211 Encounter for screening for malignant neoplasm of colon: Secondary | ICD-10-CM

## 2021-12-30 MED ORDER — NA SULFATE-K SULFATE-MG SULF 17.5-3.13-1.6 GM/177ML PO SOLN: 1.0000 | Freq: Once | ORAL | 0 refills | Status: AC

## 2021-12-30 NOTE — Progress Notes (Signed)
Gastroenterology Pre-Procedure Review  Request Date: 01/19/2022 Requesting Physician: Dr. Vicente Males  PATIENT REVIEW QUESTIONS: The patient responded to the following health history questions as indicated:    1. Are you having any GI issues? no 2. Do you have a personal history of Polyps? no 3. Do you have a family history of Colon Cancer or Polyps? no 4. Diabetes Mellitus? Yes type 2  5. Joint replacements in the past 12 months?no 6. Major health problems in the past 3 months?no 7. Any artificial heart valves, MVP, or defibrillator?no    MEDICATIONS & ALLERGIES:    Patient reports the following regarding taking any anticoagulation/antiplatelet therapy:   Plavix, Coumadin, Eliquis, Xarelto, Lovenox, Pradaxa, Brilinta, or Effient? no Aspirin? no  Patient confirms/reports the following medications:  Current Outpatient Medications  Medication Sig Dispense Refill   amLODipine (NORVASC) 10 MG tablet Take 1 tablet (10 mg total) by mouth daily. 90 tablet 1   aspirin 81 MG EC tablet Take 1 tablet (81 mg total) by mouth daily. Swallow whole. 90 tablet 3   atorvastatin (LIPITOR) 80 MG tablet Take 1 tablet by mouth once daily 90 tablet 0   Blood Pressure Monitoring (BLOOD PRESSURE DIGITAL SOLN) KIT Dispense one automatic/digital blood pressure machine; check blood pressure twice daily in AM and PM 1 kit 0   hydrocortisone 1 % lotion Apply 1 Application topically 2 (two) times daily. For itching 118 mL 1   ibuprofen (ADVIL) 600 MG tablet Take 1 tablet (600 mg total) by mouth every 8 (eight) hours as needed for moderate pain. 30 tablet 0   ketoconazole (NIZORAL) 2 % cream Apply 1 Application topically daily. On R armpit, under R breast 30 g 2   lidocaine (LIDODERM) 5 % Place 1 patch onto the skin daily. Remove & Discard patch within 12 hours or as directed by MD 30 patch 0   losartan-hydrochlorothiazide (HYZAAR) 100-25 MG tablet TAKE 1 TABLET BY MOUTH ONCE DAILY. 90 tablet 1   metFORMIN (GLUCOPHAGE) 500  MG tablet TAKE 1 TABLET BY MOUTH TWICE DAILY WITH A MEAL. 180 tablet 1   nystatin (MYCOSTATIN/NYSTOP) powder Apply 1 application. topically 3 (three) times daily as needed. To right armpit and under b/l breasts 30 g 1   oxybutynin (DITROPAN) 5 MG tablet Take 1 tablet (5 mg total) by mouth 2 (two) times daily. 60 tablet 1   Vitamin D, Ergocalciferol, (DRISDOL) 1.25 MG (50000 UNIT) CAPS capsule Take 1 capsule by mouth once a week 12 capsule 1   No current facility-administered medications for this visit.    Patient confirms/reports the following allergies:  Allergies  Allergen Reactions   Lisinopril Swelling   Latex Rash    No orders of the defined types were placed in this encounter.   AUTHORIZATION INFORMATION Primary Insurance: 1D#: Group #:  Secondary Insurance: 1D#: Group #:  SCHEDULE INFORMATION: Date: 01/19/2022 Time: Location:[facility]

## 2022-01-03 ENCOUNTER — Encounter: Payer: Self-pay | Admitting: Cardiology

## 2022-01-03 ENCOUNTER — Ambulatory Visit (INDEPENDENT_AMBULATORY_CARE_PROVIDER_SITE_OTHER): Payer: Medicare Other | Admitting: Cardiology

## 2022-01-03 VITALS — BP 136/78 | HR 91 | Ht 62.0 in | Wt 280.4 lb

## 2022-01-03 DIAGNOSIS — G4733 Obstructive sleep apnea (adult) (pediatric): Secondary | ICD-10-CM

## 2022-01-03 DIAGNOSIS — R0602 Shortness of breath: Secondary | ICD-10-CM | POA: Diagnosis not present

## 2022-01-03 DIAGNOSIS — I10 Essential (primary) hypertension: Secondary | ICD-10-CM

## 2022-01-03 DIAGNOSIS — E7800 Pure hypercholesterolemia, unspecified: Secondary | ICD-10-CM | POA: Diagnosis not present

## 2022-01-03 MED ORDER — SEMAGLUTIDE(0.25 OR 0.5MG/DOS) 2 MG/3ML ~~LOC~~ SOPN: 0.2500 mg | PEN_INJECTOR | SUBCUTANEOUS | 1 refills | Status: DC

## 2022-01-03 NOTE — Progress Notes (Signed)
Cardiology Office Note:    Date:  01/03/2022   ID:  Kristin Mora, DOB 09/22/1955, MRN [id number]  PCP:  Drubel, Lindsay, PA-C   [facility] Providers Cardiologist:   Agbor-Etang, MD     Referring MD: Drubel, Lindsay, PA-C   Chief Complaint  Patient presents with   OTHER    HTN no complaints today. Meds reviewed verbally with pt.    History of Present Illness:    Kristin Mora is a 66 y.o. female with a hx of hypertension, hyperlipidemia, diabetes, CVA-hemorrhagic, brain aneurysm s/p repair 2008 who presents due to elevated blood pressures and fatigue.  She has been diagnosed with hypertension for several years now, presents today with daughter who confirms patient is not compliant with her medications.  Systolic blood pressures at home about 2 weeks ago was in the 200s.  Followed up with primary care provider, medication compliance recommended.  She has been taking medications as prescribed, blood pressures have improved, currently systolics in the 130s.  She endorses eating salty diet.  Also endorses shortness of breath, snoring and fatigue.  Denies chest pain.  Past Medical History:  Diagnosis Date   Asthma    Brain aneurysm 04/2007   Diabetes mellitus without complication (HCC)    controlled;   HLD (hyperlipidemia)    Hypertension    controlled with medication   Stroke (HCC)    Vertigo     Past Surgical History:  Procedure Laterality Date   CRANIOTOMY  04/2007   KNEE SURGERY     TONSILLECTOMY AND ADENOIDECTOMY     TUBAL LIGATION      Current Medications: Current Meds  Medication Sig   amLODipine (NORVASC) 10 MG tablet Take 1 tablet (10 mg total) by mouth daily.   atorvastatin (LIPITOR) 80 MG tablet Take 1 tablet by mouth once daily   Blood Pressure Monitoring (BLOOD PRESSURE DIGITAL SOLN) KIT Dispense one automatic/digital blood pressure machine; check blood pressure twice daily in AM and PM   hydrocortisone 1 % lotion Apply 1 Application  topically 2 (two) times daily. For itching   ibuprofen (ADVIL) 600 MG tablet Take 1 tablet (600 mg total) by mouth every 8 (eight) hours as needed for moderate pain.   ketoconazole (NIZORAL) 2 % cream Apply 1 Application topically daily. On R armpit, under R breast   lidocaine (LIDODERM) 5 % Place 1 patch onto the skin daily. Remove & Discard patch within 12 hours or as directed by MD   losartan-hydrochlorothiazide (HYZAAR) 100-25 MG tablet TAKE 1 TABLET BY MOUTH ONCE DAILY.   metFORMIN (GLUCOPHAGE) 500 MG tablet TAKE 1 TABLET BY MOUTH TWICE DAILY WITH A MEAL.   nystatin (MYCOSTATIN/NYSTOP) powder Apply 1 application. topically 3 (three) times daily as needed. To right armpit and under b/l breasts   oxybutynin (DITROPAN) 5 MG tablet Take 1 tablet (5 mg total) by mouth 2 (two) times daily.   Semaglutide,0.25 or 0.5MG/DOS, 2 MG/3ML SOPN Inject 0.25 mg into the skin once a week. For 4 weeks, then increase to 0.5 MG once a week for 4 weeks.   Vitamin D, Ergocalciferol, (DRISDOL) 1.25 MG (50000 UNIT) CAPS capsule Take 1 capsule by mouth once a week     Allergies:   Lisinopril and Latex   Social History   Socioeconomic History   Marital status: Legally Separated    Spouse name: Not on file   Number of children: 2   Years of education: Not on file   Highest education level:   High school graduate  Occupational History   Occupation: disabled  Tobacco Use   Smoking status: Former    Types: Cigarettes    Quit date: 11/25/2009    Years since quitting: 12.1   Smokeless tobacco: Never  Vaping Use   Vaping Use: Never used  Substance and Sexual Activity   Alcohol use: No    Alcohol/week: 0.0 standard drinks of alcohol   Drug use: No   Sexual activity: Not on file  Other Topics Concern   Not on file  Social History Narrative   Not on file   Social Determinants of Health   Financial Resource Strain: Low Risk  (06/30/2020)   Overall Financial Resource Strain (CARDIA)    Difficulty of Paying  Living Expenses: Not hard at all  Food Insecurity: No Food Insecurity (06/30/2020)   Hunger Vital Sign    Worried About Running Out of Food in the Last Year: Never true    [place] in the Last Year: Never true  Transportation Needs: No Transportation Needs (06/30/2020)   PRAPARE - Hydrologist (Medical): No    Lack of Transportation (Non-Medical): No  Physical Activity: Inactive (06/30/2020)   Exercise Vital Sign    Days of Exercise per Week: 0 days    Minutes of Exercise per Session: 0 min  Stress: No Stress Concern Present (06/30/2020)   [place]    Feeling of Stress : Not at all  Social Connections: Moderately Isolated (06/30/2020)   Social Connection and Isolation Panel [NHANES]    Frequency of Communication with Friends and Family: Three times a week    Frequency of Social Gatherings with Friends and Family: More than three times a week    Attends Religious Services: More than 4 times per year    Active Member of [employer] or Organizations: No    Attends Archivist Meetings: Never    Marital Status: Separated     Family History: The patient's family history includes Bone cancer in her brother; Coronary artery disease in her mother; Diabetes in her sister; Heart Problems in her sister; Heart attack in her mother; Lung cancer in her father and sister; Stroke in her brother; Uterine cancer in her sister.  ROS:   Please see the history of present illness.     All other systems reviewed and are negative.  EKGs/Labs/Other Studies Reviewed:    The following studies were reviewed today:   EKG:  EKG is  ordered today.  The ekg ordered today demonstrates sinus rhythm, PVCs  Recent Labs: 09/01/2021: ALT 22; BUN 16; Creatinine, Ser 0.73; Potassium 4.0; Sodium 143  Recent Lipid Panel    Component Value Date/Time   CHOL 167 09/01/2021 1441   TRIG 124 09/01/2021 1441   HDL 34 (L)  09/01/2021 1441   CHOLHDL 4.9 (H) 09/01/2021 1441   CHOLHDL 5.1 09/16/2015 0613   VLDL 23 09/16/2015 0613   LDLCALC 110 (H) 09/01/2021 1441     Risk Assessment/Calculations:          Physical Exam:    VS:  BP 136/78 (BP Location: Right Arm, Patient Position: Sitting, Cuff Size: Large)   Pulse 91   Ht 5' 2" (1.575 m)   Wt 280 lb 6 oz (127.2 kg)   SpO2 98%   BMI 51.28 kg/m     Wt Readings from Last 3 Encounters:  01/03/22 280 lb 6 oz (127.2 kg)  12/29/21 281 lb 8 oz (127.7 kg)  11/30/21 281 lb (127.5 kg)     GEN:  Well nourished, well developed in no acute distress HEENT: Normal NECK: No JVD; No carotid bruits LYMPHATICS: No lymphadenopathy CARDIAC: RRR, no murmurs, rubs, gallops RESPIRATORY:  Clear to auscultation without rales, wheezing or rhonchi  ABDOMEN: Soft, non-tender, non-distended MUSCULOSKELETAL:  No edema; No deformity  SKIN: Warm and dry NEUROLOGIC:  Alert and oriented x 3 PSYCHIATRIC:  Normal affect   ASSESSMENT:    1. Primary hypertension   2. Pure hypercholesterolemia   3. OSA (obstructive sleep apnea)   4. Morbid obesity ([place])   5. Shortness of breath    PLAN:    In order of problems listed above:  Hypertension, medication noncompliance.  Patient counseled on taking medications as prescribed, low-salt diet advised.  Blood pressures much improved, currently systolics of 676H.  Continue HCTZ, losartan, amlodipine. Hyperlipidemia, continue high intensity statin 80 mg daily. OSA, noncompliant with CPAP, follow-up with sleep specialist for retesting consideration. Morbid obesity, diabetic.  Low-calorie diet, start Ozempic. Shortness of breath, morbid obesity, untreated sleep apnea could be contributing.  Get echocardiogram.  Follow-up after echo.       Medication Adjustments/Labs and Tests Ordered: Current medicines are reviewed at length with the patient today.  Concerns regarding medicines are outlined above.  Orders Placed This Encounter   Procedures   Ambulatory referral to Pulmonology   EKG 12-Lead   ECHOCARDIOGRAM COMPLETE   Meds ordered this encounter  Medications   Semaglutide,0.25 or 0.5MG/DOS, 2 MG/3ML SOPN    Sig: Inject 0.25 mg into the skin once a week. For 4 weeks, then increase to 0.5 MG once a week for 4 weeks.    Dispense:  3 mL    Refill:  1    Patient Instructions  Medication Instructions:   Your physician has recommended you make the following change in your medication:  Start taking Ozempic:  Inject 0.25 MG into skin once a week, for 4 weeks.     Inject 0.50 MG into skin once a week, for 4 weeks. (Call or send [place] a MyChart message before you run out of this dose so we can send the next dose in)     Inject 1.0 MG into skin once a week, for 4 weeks.     Inject 2.0 MG into skin once a week, and continue at this dose.    *If you need a refill on your cardiac medications before your next appointment, please call your pharmacy*   Testing/Procedures:  Your physician has requested that you have an echocardiogram. Echocardiography is a painless test that uses sound waves to create images of your heart. It provides your doctor with information about the size and shape of your heart and how well your heart's chambers and valves are working. This procedure takes approximately one hour. There are no restrictions for this procedure.    Follow-Up: At [facility], you and your health needs are our priority.  As part of our continuing mission to provide you with exceptional heart care, we have created designated Provider Care Teams.  These Care Teams include your primary Cardiologist (physician) and Advanced Practice Providers (APPs -  Physician Assistants and Nurse Practitioners) who all work together to provide you with the care you need, when you need it.  We recommend signing up for the patient portal called "MyChart".  Sign up information is provided on this After Visit Summary.  MyChart is used  to  connect with patients for Virtual Visits (Telemedicine).  Patients are able to view lab/test results, encounter notes, upcoming appointments, etc.  Non-urgent messages can be sent to your provider as well.   To learn more about what you can do with MyChart, go to [email or web address].      Your next appointment:    Follow up after testing   The format for your next appointment:   In Person  Provider:    Dr. Rosiland Oz   Other Instructions   Important Information About Sugar         Signed, Kate Sable, MD  01/03/2022 2:27 PM    [place]

## 2022-01-03 NOTE — Patient Instructions (Addendum)
Medication Instructions:   Your physician has recommended you make the following change in your medication:  Start taking Ozempic:  Inject 0.25 MG into skin once a week, for 4 weeks.     Inject 0.50 MG into skin once a week, for 4 weeks. (Call or send [place] a MyChart message before you run out of this dose so we can send the next dose in)     Inject 1.0 MG into skin once a week, for 4 weeks.     Inject 2.0 MG into skin once a week, and continue at this dose.    *If you need a refill on your cardiac medications before your next appointment, please call your pharmacy*   Testing/Procedures:  Your physician has requested that you have an echocardiogram. Echocardiography is a painless test that uses sound waves to create images of your heart. It provides your doctor with information about the size and shape of your heart and how well your heart's chambers and valves are working. This procedure takes approximately one hour. There are no restrictions for this procedure.    Follow-Up: At [facility], you and your health needs are our priority.  As part of our continuing mission to provide you with exceptional heart care, we have created designated Provider Care Teams.  These Care Teams include your primary Cardiologist (physician) and Advanced Practice Providers (APPs -  Physician Assistants and Nurse Practitioners) who all work together to provide you with the care you need, when you need it.  We recommend signing up for the patient portal called "MyChart".  Sign up information is provided on this After Visit Summary.  MyChart is used to connect with patients for Virtual Visits (Telemedicine).  Patients are able to view lab/test results, encounter notes, upcoming appointments, etc.  Non-urgent messages can be sent to your provider as well.   To learn more about what you can do with MyChart, go to [email or web address].      Your next appointment:    Follow up after testing   The  format for your next appointment:   In Person  Provider:    Dr. Garen Lah ONLY   Other Instructions   Important Information About Sugar

## 2022-01-12 ENCOUNTER — Ambulatory Visit: Payer: Medicare Other | Admitting: Physician Assistant

## 2022-01-12 ENCOUNTER — Ambulatory Visit (INDEPENDENT_AMBULATORY_CARE_PROVIDER_SITE_OTHER): Payer: Medicare Other | Admitting: Dermatology

## 2022-01-12 DIAGNOSIS — L304 Erythema intertrigo: Secondary | ICD-10-CM

## 2022-01-12 DIAGNOSIS — B354 Tinea corporis: Secondary | ICD-10-CM

## 2022-01-12 DIAGNOSIS — L089 Local infection of the skin and subcutaneous tissue, unspecified: Secondary | ICD-10-CM | POA: Diagnosis not present

## 2022-01-12 DIAGNOSIS — B009 Herpesviral infection, unspecified: Secondary | ICD-10-CM

## 2022-01-12 MED ORDER — VALACYCLOVIR HCL 1 G PO TABS: ORAL_TABLET | ORAL | 0 refills | Status: DC

## 2022-01-12 MED ORDER — CEPHALEXIN 500 MG PO CAPS: 500.0000 mg | ORAL_CAPSULE | Freq: Three times a day (TID) | ORAL | 0 refills | Status: AC

## 2022-01-12 MED ORDER — TERBINAFINE HCL 1 % EX CREA: 1.0000 | TOPICAL_CREAM | Freq: Two times a day (BID) | CUTANEOUS | 2 refills | Status: AC

## 2022-01-12 MED ORDER — MUPIROCIN 2 % EX OINT: 1.0000 | TOPICAL_OINTMENT | Freq: Three times a day (TID) | CUTANEOUS | 2 refills | Status: DC

## 2022-01-12 NOTE — Patient Instructions (Addendum)
Take valacyclovir 2 tablets now, and take 2 more tablets tomorrow morning.  Take cephalexin tablet three times a day for 5 days  Apply mupirocin ointment to lip 3 times a day  Apply terbinafine cream, ketoconazole cream, and hydrocortisone 1% cream twice a day to rash at right underarm and right under breast  If rash is not significantly better in 1 week, please send [place] a message or call to let us know and we may consider a pill antifungal.  We will call with results from the tests in the next 1-2 weeks.    Due to recent changes in healthcare laws, you may see results of your pathology and/or laboratory studies on MyChart before the doctors have had a chance to review them. We understand that in some cases there may be results that are confusing or concerning to you. Please understand that not all results are received at the same time and often the doctors may need to interpret multiple results in order to provide you with the best plan of care or course of treatment. Therefore, we ask that you please give [place] 2 business days to thoroughly review all your results before contacting the office for clarification. Should we see a critical lab result, you will be contacted sooner.   If You Need Anything After Your Visit  If you have any questions or concerns for your doctor, please call our main line at [phone] and press option 4 to reach your doctor's medical assistant. If no one answers, please leave a voicemail as directed and we will return your call as soon as possible. Messages left after 4 pm will be answered the following business day.   You may also send [place] a message via [place]. We typically respond to MyChart messages within 1-2 business days.  For prescription refills, please ask your pharmacy to contact our office. Our fax number is [phone].  If you have an urgent issue when the clinic is closed that cannot wait until the next business day, you can page your doctor at the  number below.    Please note that while we do our best to be available for urgent issues outside of office hours, we are not available 24/7.   If you have an urgent issue and are unable to reach [place], you may choose to seek medical care at your doctor's office, retail clinic, urgent care center, or emergency room.  If you have a medical emergency, please immediately call 911 or go to the emergency department.  Pager Numbers  - Dr. Nehemiah Massed: [phone]  - Dr. Laurence Ferrari: [phone]  - Dr. Nicole Kindred: [phone]  In the event of inclement weather, please call our main line at [phone] for an update on the status of any delays or closures.  Dermatology Medication Tips: Please keep the boxes that topical medications come in in order to help keep track of the instructions about where and how to use these. Pharmacies typically print the medication instructions only on the boxes and not directly on the medication tubes.   If your medication is too expensive, please contact our office at [phone] option 4 or send [place] a message through [place].   We are unable to tell what your co-pay for medications will be in advance as this is different depending on your insurance coverage. However, we may be able to find a substitute medication at lower cost or fill out paperwork to get insurance to cover a needed medication.   If a prior authorization is  required to get your medication covered by your insurance company, please allow [place] 1-2 business days to complete this process.  Drug prices often vary depending on where the prescription is filled and some pharmacies may offer cheaper prices.  The website www.goodrx.com contains coupons for medications through different pharmacies. The prices here do not account for what the cost may be with help from insurance (it may be cheaper with your insurance), but the website can give you the price if you did not use any insurance.  - You can print the associated  coupon and take it with your prescription to the pharmacy.  - You may also stop by our office during regular business hours and pick up a GoodRx coupon card.  - If you need your prescription sent electronically to a different pharmacy, notify our office through [facility] or by phone at [phone] option 4.     Si Usted Necesita Algo Despus de Su Visita  Tambin puede enviarnos un mensaje a travs de Pharmacist, community. Por lo general respondemos a los mensajes de MyChart en el transcurso de 1 a 2 das hbiles.  Para renovar recetas, por favor pida a su farmacia que se ponga en contacto con nuestra oficina. Harland Dingwall de fax es [place] [phone].  Si tiene un asunto urgente cuando la clnica est cerrada y que no puede esperar hasta el siguiente da hbil, puede llamar/localizar a su doctor(a) al nmero que aparece a continuacin.   Por favor, tenga en cuenta que aunque hacemos todo lo posible para estar disponibles para asuntos urgentes fuera del horario de [place], no estamos disponibles las 24 horas del da, los 7 das de la [place].   Si tiene un problema urgente y no puede comunicarse con nosotros, puede optar por buscar atencin mdica  en el consultorio de su doctor(a), en una clnica privada, en un centro de atencin urgente o en una sala de emergencias.  Si tiene Engineering geologist, por favor llame inmediatamente al 911 o vaya a la sala de emergencias.  Nmeros de bper  - Dr. Nehemiah Massed: [phone]  - Dra. Moye: [phone]  - Dra. Nicole Kindred: [phone]  En caso de inclemencias del [place], por favor llame a Johnsie Kindred principal al [phone] para una actualizacin sobre el [place] de cualquier retraso o cierre.  Consejos para la medicacin en dermatologa: Por favor, guarde las cajas en las que vienen los medicamentos de uso tpico para ayudarle a seguir las instrucciones sobre dnde y cmo usarlos. Las farmacias generalmente imprimen las instrucciones del  medicamento slo en las cajas y no directamente en los tubos del [place].   Si su medicamento es muy caro, por favor, pngase en contacto con Zigmund Daniel llamando al [phone] y presione la opcin 4 o envenos un mensaje a travs de Pharmacist, community.   No podemos decirle cul ser su copago por los medicamentos por adelantado ya que esto es diferente dependiendo de la cobertura de su seguro. Sin embargo, es posible que podamos encontrar un medicamento sustituto a Electrical engineer un formulario para que el seguro cubra el medicamento que se considera necesario.   Si se requiere una autorizacin previa para que su compaa de seguros [place] su medicamento, por favor permtanos de 1 a 2 das hbiles para completar este proceso.  Los precios de los medicamentos varan con frecuencia dependiendo del Environmental consultant de dnde se surte la receta y alguna farmacias pueden ofrecer precios ms baratos.  El sitio web www.goodrx.com tiene cupones para medicamentos de  diferentes farmacias. Los precios aqu no tienen en cuenta lo que podra costar con la ayuda del seguro (puede ser ms barato con su seguro), pero el sitio web puede darle el precio si no utiliz Research scientist (physical sciences).  - Puede imprimir el cupn correspondiente y llevarlo con su receta a la farmacia.  - Tambin puede pasar por nuestra oficina durante el horario de atencin regular y Charity fundraiser una tarjeta de cupones de GoodRx.  - Si necesita que su receta se enve electrnicamente a una farmacia diferente, informe a nuestra oficina a travs de MyChart de [place] o por telfono llamando al [phone] y presione la opcin 4.

## 2022-01-12 NOTE — Progress Notes (Signed)
New Patient Visit  Subjective  Kristin Mora is a 66 y.o. female who presents for the following: New Patient (Initial Visit) (Here today with concerns about rash under breast and under arms. Reports pcp prescribed ketoconazole and hydrocoritisone to use and it has helped some, but still not clear. She also reports a bump at right lower lip she noticed 3 days ago. She states she has been picking at it and it will not go away. She also reports several bumps at abdomen and upper thighs she would like checked. ).  The following portions of the chart were reviewed this encounter and updated as appropriate:   Tobacco  Allergies  Meds  Problems  Med Hx  Surg Hx  Fam Hx      Review of Systems:  No other skin or systemic complaints except as noted in HPI or Assessment and Plan.  Objective  Well appearing patient in no apparent distress; mood and affect are within normal limits.  A focused examination was performed including right lower lip, abdomen, thighs, under arms, right breast. Relevant physical exam findings are noted in the Assessment and Plan.  right lower vermillion lip, abdomen, b/l upper thighs Multiple scattered pustules at abdomen, legs  Lip with crusted erosion  b/l  inframammary fold and axilla Erythematous macerated well-demarcated slightly scaly patches     Assessment & Plan  Local infection of skin and subcutaneous tissue right lower vermillion lip, abdomen, b/l upper thighs  Reports hx of cold sores   Will test lip to r/o cold sore - PCR HSV  Will culture at lip to r/o impetigo Will culture left abdomen pustule  Take Valacyclovir 2 tablets now, and take 2 more tablets tomorrow morning.  Take Cephalexin tablet three times a day for 5 days  Apply Mupirocin ointment to lip 3 times a day  We will call with results from the tests in the next 1-2 weeks.   Anaerobic and Aerobic Culture - right lower vermillion lip, abdomen, b/l upper thighs  Anaerobic  and Aerobic Culture - right lower vermillion lip, abdomen, b/l upper thighs  valACYclovir (VALTREX) 1000 MG tablet - right lower vermillion lip, abdomen, b/l upper thighs Take 2 tablets by mouth with water, then take 2 more tablets in 12 hours.  mupirocin ointment (BACTROBAN) 2 % - right lower vermillion lip, abdomen, b/l upper thighs Apply 1 Application topically 3 (three) times daily. Apply to right lower lip  cephALEXin (KEFLEX) 500 MG capsule - right lower vermillion lip, abdomen, b/l upper thighs Take 1 capsule (500 mg total) by mouth 3 (three) times daily for 5 days.  HSV and VZV PCR Panel - right lower vermillion lip, abdomen, b/l upper thighs  Tinea corporis b/l  inframammary fold and axilla  > intertrigo  For rash right breast and right under arm  Continue ketoconazole 2 % cream apply bid to rash  Continue Hydrocortisone 1 % lotion apply bid to rash  Start Terbinafine Cream (otc) apply bid to rash   Patient instructed if rash is not significantly better in 1 week, please send [place] a message or call to let us know and we may consider a pill antifungal.  terbinafine (LAMISIL) 1 % cream - b/l  inframammary fold and axilla Apply 1 Application topically 2 (two) times daily. To affected areas of rash under breast and under arms  HSV (herpes simplex virus) infection Left Lower Vermilion Lip  Chronic and persistent condition with duration or expected duration over one year. Condition  is bothersome/symptomatic for patient. Currently flared.  Has known history of cold sores  Herpes Simplex Virus = Cold Sores = Fever Blisters is a chronic recurring blistering; scabbing sore-producing viral infection that is recurrent usually in the same area triggered by stress, sun/UV exposure and trauma.  It is infectious and can be spread from person to person by direct contact.  It is not curable, but is treatable with topical and oral medication.  Start valacyclovir 2 grams now, repeat 2 grams  in the morning. Take with a glass of water.   Return for 2 week follow up on rash / infection .  I, Ruthell Rummage, CMA, am acting as scribe for [name], MD.  Documentation: I have reviewed the above documentation for accuracy and completeness, and I agree with the above.  [name], MD

## 2022-01-14 ENCOUNTER — Ambulatory Visit: Payer: Self-pay | Admitting: *Deleted

## 2022-01-14 NOTE — Telephone Encounter (Signed)
  Chief Complaint: right wrist pain Symptoms: right wrist swollen around whole wrist. Can hardly move . Can not pick up glass or objects. Dull pain .  Frequency: 2 days ago no worse Pertinent Negatives: Patient denies fever, no swelling in hand or fingers . Did not injury wrist. No other joint pain reported  Disposition: '[]'$ ED /'[x]'$ Urgent Care (no appt availability in office) / '[]'$ Appointment(In office/virtual)/ '[]'$  [place] [facility]/ '[]'$ Home Care/ '[]'$ Refused Recommended Disposition /'[]'$ [facility]/ '[]'$  Follow-up with PCP Additional Notes:   No available appt today. Recommended UC or ED.     Reason for Disposition  SEVERE swelling (e.g., can barely bend or move wrist joint)  Answer Assessment - Initial Assessment Questions 1. ONSET: "When did the swelling start?" (e.g., minutes, hours, days, weeks)     2 days  2. LOCATION: "What part of the wrist is swollen?"  "Are both wrists swollen or just one wrist?"     Right swollen around whole wrist  3. SEVERITY: "How bad is the swelling?"    * NONE: No joint swelling.   * SKIN ONLY: Localized; small area of puffy or swollen skin.   * BALL OR LUMP: There is a small firm ball or lump; size of a pea, marble, or grape.   * MILD: Joint looks or feels mildly swollen or puffy.   * MODERATE: Swollen; interferes with normal activities (e.g., work or school); decreased range of movement.   * SEVERE: Very swollen; can't move swollen joint at all; unable to hold a cup of water.     severe 4. RECURRENT SYMPTOM: "Have you had wrist swelling before?" If Yes, ask: "When was the last time?" "What happened that time?"     no 5. CAUSE: "What do you think is causing the wrist swelling?" (e.g., arthritis, ganglion cyst, insect bite, recent injury)     Not sure  6. OTHER SYMPTOMS: "Do you have any other symptoms?" (e.g., fever, hand pain)     Hurting like a toothache 7. PREGNANCY: "Is there any chance you are pregnant?" "When was your last menstrual  period?"     na  Protocols used: Wrist Swelling-A-AH

## 2022-01-16 LAB — HSV AND VZV PCR PANEL
HSV 2 DNA: NEGATIVE
HSV-1 DNA: POSITIVE — AB
Varicella-Zoster, PCR: NEGATIVE

## 2022-01-18 ENCOUNTER — Telehealth: Payer: Self-pay

## 2022-01-18 ENCOUNTER — Encounter: Payer: Self-pay | Admitting: Dermatology

## 2022-01-18 ENCOUNTER — Encounter: Payer: Self-pay | Admitting: Gastroenterology

## 2022-01-18 LAB — ANAEROBIC AND AEROBIC CULTURE

## 2022-01-18 NOTE — Telephone Encounter (Addendum)
Called and discussed results with patient. She reports she has improved and is still finishing antibiotic. She will call us after if she notices any new spots. ----- Message from Alfonso Patten, MD sent at 01/18/2022  1:58 PM EDT ----- Culture grew Staph aureus --> already treated with cephalexin. If she is doing well with no new spots, no additional treatment needed.  MAs please call. Thank you!

## 2022-01-18 NOTE — Telephone Encounter (Addendum)
Called and discussed results with patient. She verbalized understanding and denies further questions.   ----- Message from Alfonso Patten, MD sent at 01/17/2022  6:32 PM EDT ----- Testing showed a cold sore at her lip. Already treated. No additional treatment needed right now. Can discuss treatment for future cold sores at her follow-up.  MAs please call. Thank you!

## 2022-01-19 ENCOUNTER — Ambulatory Visit
Admission: RE | Admit: 2022-01-19 | Discharge: 2022-01-19 | Disposition: A | Discharge status: Home / Self Care | Payer: Medicare Other | Attending: Gastroenterology | Admitting: Gastroenterology

## 2022-01-19 ENCOUNTER — Encounter: Payer: Self-pay | Admitting: Gastroenterology

## 2022-01-19 ENCOUNTER — Ambulatory Visit: Payer: Medicare Other | Admitting: Anesthesiology

## 2022-01-19 ENCOUNTER — Other Ambulatory Visit: Payer: Self-pay

## 2022-01-19 ENCOUNTER — Encounter
Admission: RE | Disposition: A | Discharge status: Home / Self Care | Payer: Self-pay | Source: Home / Self Care | Attending: Gastroenterology

## 2022-01-19 DIAGNOSIS — Z6841 Body Mass Index (BMI) 40.0 and over, adult: Secondary | ICD-10-CM | POA: Insufficient documentation

## 2022-01-19 DIAGNOSIS — I10 Essential (primary) hypertension: Secondary | ICD-10-CM | POA: Insufficient documentation

## 2022-01-19 DIAGNOSIS — E119 Type 2 diabetes mellitus without complications: Secondary | ICD-10-CM | POA: Diagnosis not present

## 2022-01-19 DIAGNOSIS — Z87891 Personal history of nicotine dependence: Secondary | ICD-10-CM | POA: Insufficient documentation

## 2022-01-19 DIAGNOSIS — D126 Benign neoplasm of colon, unspecified: Secondary | ICD-10-CM | POA: Diagnosis not present

## 2022-01-19 DIAGNOSIS — Z1211 Encounter for screening for malignant neoplasm of colon: Secondary | ICD-10-CM | POA: Insufficient documentation

## 2022-01-19 DIAGNOSIS — Z8673 Personal history of transient ischemic attack (TIA), and cerebral infarction without residual deficits: Secondary | ICD-10-CM | POA: Diagnosis not present

## 2022-01-19 DIAGNOSIS — J449 Chronic obstructive pulmonary disease, unspecified: Secondary | ICD-10-CM | POA: Diagnosis not present

## 2022-01-19 DIAGNOSIS — D122 Benign neoplasm of ascending colon: Secondary | ICD-10-CM | POA: Diagnosis not present

## 2022-01-19 DIAGNOSIS — D125 Benign neoplasm of sigmoid colon: Secondary | ICD-10-CM | POA: Diagnosis not present

## 2022-01-19 DIAGNOSIS — D123 Benign neoplasm of transverse colon: Secondary | ICD-10-CM | POA: Insufficient documentation

## 2022-01-19 HISTORY — PX: COLONOSCOPY WITH PROPOFOL: SHX5780

## 2022-01-19 LAB — GLUCOSE, CAPILLARY: Glucose-Capillary: 105 mg/dL — ABNORMAL HIGH (ref 70–99)

## 2022-01-19 SURGERY — COLONOSCOPY WITH PROPOFOL
Anesthesia: General

## 2022-01-19 MED ORDER — PROPOFOL 500 MG/50ML IV EMUL
INTRAVENOUS | Status: DC | PRN
  Administered 2022-01-19: 50 ug/kg/min via INTRAVENOUS

## 2022-01-19 MED ORDER — MIDAZOLAM HCL 2 MG/2ML IJ SOLN
INTRAMUSCULAR | Status: AC
  Filled 2022-01-19: qty 2

## 2022-01-19 MED ORDER — PROPOFOL 1000 MG/100ML IV EMUL
INTRAVENOUS | Status: AC
  Filled 2022-01-19: qty 100

## 2022-01-19 MED ORDER — SODIUM CHLORIDE (PF) 0.9 % IJ SOLN
INTRAMUSCULAR | Status: DC | PRN
  Administered 2022-01-19: 3 mL

## 2022-01-19 MED ORDER — SODIUM CHLORIDE 0.9 % IV SOLN: INTRAVENOUS | Status: DC

## 2022-01-19 MED ORDER — PROPOFOL 10 MG/ML IV BOLUS
INTRAVENOUS | Status: DC | PRN
  Administered 2022-01-19: 50 mg via INTRAVENOUS

## 2022-01-19 MED ORDER — LIDOCAINE HCL (PF) 2 % IJ SOLN
INTRAMUSCULAR | Status: AC
  Filled 2022-01-19: qty 5

## 2022-01-19 MED ORDER — LIDOCAINE HCL (CARDIAC) PF 100 MG/5ML IV SOSY
PREFILLED_SYRINGE | INTRAVENOUS | Status: DC | PRN
  Administered 2022-01-19: 50 mg via INTRAVENOUS

## 2022-01-19 MED ORDER — MIDAZOLAM HCL 2 MG/2ML IJ SOLN
INTRAMUSCULAR | Status: DC | PRN
  Administered 2022-01-19: 2 mg via INTRAVENOUS

## 2022-01-19 NOTE — Transfer of Care (Signed)
Immediate Anesthesia Transfer of Care Note  Patient: Kristin Mora  Procedure(s) Performed: COLONOSCOPY WITH PROPOFOL  Patient Location: PACU  Anesthesia Type:General  Level of Consciousness: sedated  Airway & Oxygen Therapy: Patient Spontanous Breathing and Patient connected to nasal cannula oxygen  Post-op Assessment: Report given to RN and Post -op Vital signs reviewed and stable  Post vital signs: Reviewed and stable  Last Vitals:  Vitals Value Taken Time  BP 111/74 01/19/22 1131  Temp 35.7 C 01/19/22 1131  Pulse 86 01/19/22 1131  Resp 20 01/19/22 1131  SpO2 100 % 01/19/22 1131  Vitals shown include unvalidated device data.  Last Pain:  Vitals:   01/19/22 1131  TempSrc: Temporal  PainSc:          Complications: No notable events documented.

## 2022-01-19 NOTE — Anesthesia Preprocedure Evaluation (Signed)
Anesthesia Evaluation  Patient identified by MRN, date of birth, ID band Patient awake    Reviewed: Allergy & Precautions, NPO status , Patient's Chart, lab work & pertinent test results  Airway Mallampati: III  TM Distance: >3 FB     Dental  (+) Edentulous Upper, Partial Lower, Poor Dentition, Dental Advisory Given   Pulmonary asthma , sleep apnea , COPD,  COPD inhaler, former smoker,     + decreased breath sounds      Cardiovascular Exercise Tolerance: Poor hypertension, Pt. on medications + DOE  + dysrhythmias  Rhythm:Regular Rate:Normal     Neuro/Psych Hx of cerebral aneurysm... CVA    GI/Hepatic negative GI ROS, Neg liver ROS,   Endo/Other  diabetes, Type 2Morbid obesity  Renal/GU   negative genitourinary   Musculoskeletal   Abdominal (+) + obese,   Peds negative pediatric ROS (+)  Hematology negative hematology ROS (+)   Anesthesia Other Findings   Reproductive/Obstetrics                             Anesthesia Physical Anesthesia Plan  ASA: 3  Anesthesia Plan: General   Post-op Pain Management:    Induction: Intravenous  PONV Risk Score and Plan:   Airway Management Planned: Natural Airway  Additional Equipment:   Intra-op Plan:   Post-operative Plan:   Informed Consent: I have reviewed the patients History and Physical, chart, labs and discussed the procedure including the risks, benefits and alternatives for the proposed anesthesia with the patient or authorized representative who has indicated his/her understanding and acceptance.       Plan Discussed with: CRNA and Surgeon  Anesthesia Plan Comments:         Anesthesia Quick Evaluation

## 2022-01-19 NOTE — Op Note (Signed)
[facility] Gastroenterology Patient Name: Kristin Mora Procedure Date: 01/19/2022 11:04 AM MRN: [id number] Account #: [id number] Date of Birth: 06/21/1956 Admit Type: Outpatient Age: 66 Room: [facility] ENDO ROOM 2 Gender: Female Note Status: Finalized Instrument Name: Park Meo [id number] Procedure:             Colonoscopy Indications:           Screening for colorectal malignant neoplasm Providers:             Jonathon Bellows MD, MD Medicines:             Monitored Anesthesia Care Complications:         No immediate complications. Procedure:             Pre-Anesthesia Assessment:                        - Prior to the procedure, a History and Physical was                         performed, and patient medications, allergies and                         sensitivities were reviewed. The patient's tolerance                         of previous anesthesia was reviewed.                        - The risks and benefits of the procedure and the                         sedation options and risks were discussed with the                         patient. All questions were answered and informed                         consent was obtained.                        - ASA Grade Assessment: II - A patient with mild                         systemic disease.                        After obtaining informed consent, the colonoscope was                         passed under direct vision. Throughout the procedure,                         the patient's blood pressure, pulse, and oxygen                         saturations were monitored continuously. The                         Colonoscope was introduced through the anus and  advanced to the the cecum, identified by the                         appendiceal orifice. The colonoscopy was performed                         with ease. The patient tolerated the procedure well.                         The quality of the bowel  preparation was excellent. Findings:      The perianal and digital rectal examinations were normal.      Two sessile polyps were found in the sigmoid colon. The polyps were 4 to       5 mm in size. These polyps were removed with a cold snare. Resection and       retrieval were complete.      A 3 mm polyp was found in the transverse colon. The polyp was sessile.       The polyp was removed with a jumbo cold forceps. Resection and retrieval       were complete.      A 12 mm polyp was found in the mid ascending colon. The polyp was       sessile. Preparations were made for mucosal resection. Saline was       injected to raise the lesion. Snare mucosal resection was performed.       Resection and retrieval were complete. To close a defect after mucosal       resection, one hemostatic clip was successfully placed. There was no       bleeding during, or at the end, of the procedure. Borders of polyp       marked using blue or nbi light      The exam was otherwise without abnormality.      The exam was otherwise without abnormality on direct and retroflexion       views. Impression:            - Two 4 to 5 mm polyps in the sigmoid colon, removed                         with a cold snare. Resected and retrieved.                        - One 3 mm polyp in the transverse colon, removed with                         a jumbo cold forceps. Resected and retrieved.                        - One 12 mm polyp in the mid ascending colon, removed                         with mucosal resection. Resected and retrieved. Clip                         was placed.                        - The examination was otherwise normal.                        -  The examination was otherwise normal on direct and                         retroflexion views.                        - Mucosal resection was performed. Resection and                         retrieval were complete. Recommendation:        - Discharge patient to home  (with escort).                        - Resume previous diet.                        - Continue present medications.                        - Await pathology results.                        - Repeat colonoscopy in 3 years for surveillance. Procedure Code(s):     --- Professional ---                        [phone], Colonoscopy, flexible; with endoscopic mucosal                         resection                        45385, 47, Colonoscopy, flexible; with removal of                         tumor(s), polyp(s), or other lesion(s) by snare                         technique                        45380, 7, Colonoscopy, flexible; with biopsy, single                         or multiple Diagnosis Code(s):     --- Professional ---                        K63.5, Polyp of colon                        Z12.11, Encounter for screening for malignant neoplasm                         of colon CPT copyright 2019 American Medical Association. All rights reserved. The codes documented in this report are preliminary and upon coder review may  be revised to meet current compliance requirements. Jonathon Bellows, MD Jonathon Bellows MD, MD 01/19/2022 11:29:29 AM This report has been signed electronically. Number of Addenda: 0 Note Initiated On: 01/19/2022 11:04 AM Scope Withdrawal Time: 0 hours 12 minutes 28 seconds  Total Procedure Duration: 0 hours 17 minutes 34 seconds  Estimated Blood Loss:  Estimated blood loss: none.  [facility]

## 2022-01-19 NOTE — Anesthesia Postprocedure Evaluation (Signed)
Anesthesia Post Note  Patient: Kristin Mora  Procedure(s) Performed: COLONOSCOPY WITH PROPOFOL  Patient location during evaluation: PACU Anesthesia Type: General Level of consciousness: awake Pain management: pain level controlled Vital Signs Assessment: post-procedure vital signs reviewed and stable Respiratory status: spontaneous breathing and respiratory function stable Cardiovascular status: stable Anesthetic complications: no   No notable events documented.   Last Vitals:  Vitals:   01/19/22 1131 01/19/22 1141  BP: 111/74 126/74  Pulse: 81 86  Resp: 18 15  Temp: (!) 35.7 C   SpO2: 97% 98%    Last Pain:  Vitals:   01/19/22 1141  TempSrc:   PainSc: 0-No pain                 VAN STAVEREN,Dealva Lafoy

## 2022-01-19 NOTE — H&P (Signed)
Jonathon Bellows, MD [address] [place], [place], [place], [address] [address], [place], [place], [place], [address] Phone: [phone]  Fax: [phone]  Primary Care Physician:  [name], PA-C   Pre-Procedure History & Physical: HPI:  Kristin Mora is a 66 y.o. female is here for an colonoscopy.   Past Medical History:  Diagnosis Date   Asthma    Brain aneurysm 04/2007   Diabetes mellitus without complication ([place])    controlled;   HLD (hyperlipidemia)    Hypertension    controlled with medication   Stroke [facility])    Vertigo     Past Surgical History:  Procedure Laterality Date   CRANIOTOMY  04/28/2007   KNEE SURGERY     TONSILLECTOMY     TONSILLECTOMY AND ADENOIDECTOMY     TUBAL LIGATION      Prior to Admission medications   Medication Sig Start Date End Date Taking? Authorizing Provider  amLODipine (NORVASC) 10 MG tablet Take 1 tablet (10 mg total) by mouth daily. 08/25/21  Yes Drubel, Ria Comment, PA-C  losartan-hydrochlorothiazide (HYZAAR) 100-25 MG tablet TAKE 1 TABLET BY MOUTH ONCE DAILY. 12/29/21  Yes [name], PA-C  Vitamin D, Ergocalciferol, (DRISDOL) 1.25 MG (50000 UNIT) CAPS capsule Take 1 capsule by mouth once a week 09/02/21  Yes Drubel, Ria Comment, PA-C  atorvastatin (LIPITOR) 80 MG tablet Take 1 tablet by mouth once daily 06/29/21   Virginia Crews, MD  Blood Pressure Monitoring (BLOOD PRESSURE DIGITAL SOLN) KIT Dispense one automatic/digital blood pressure machine; check blood pressure twice daily in AM and PM 12/29/21   Drubel, Ria Comment, PA-C  hydrocortisone 1 % lotion Apply 1 Application topically 2 (two) times daily. For itching 12/29/21   Drubel, Ria Comment, PA-C  ibuprofen (ADVIL) 600 MG tablet Take 1 tablet (600 mg total) by mouth every 8 (eight) hours as needed for moderate pain. 07/30/20   Flinchum, Kelby Aline, FNP  ketoconazole (NIZORAL) 2 % cream Apply 1 Application topically daily. On R armpit, under R breast 12/29/21   Drubel, Ria Comment,  PA-C  lidocaine (LIDODERM) 5 % Place 1 patch onto the skin daily. Remove & Discard patch within 12 hours or as directed by MD 08/15/21   Rada Hay, MD  metFORMIN (GLUCOPHAGE) 500 MG tablet TAKE 1 TABLET BY MOUTH TWICE DAILY WITH A MEAL. 08/25/21   [name], PA-C  mupirocin ointment (BACTROBAN) 2 % Apply 1 Application topically 3 (three) times daily. Apply to right lower lip 01/12/22   Laurence Ferrari, [place], MD  nystatin (MYCOSTATIN/NYSTOP) powder Apply 1 application. topically 3 (three) times daily as needed. To right armpit and under b/l breasts 11/30/21   Drubel, Ria Comment, PA-C  oxybutynin (DITROPAN) 5 MG tablet Take 1 tablet (5 mg total) by mouth 2 (two) times daily. 09/02/21   [name], PA-C  Semaglutide,0.25 or 0.5MG/DOS, 2 MG/3ML SOPN Inject 0.25 mg into the skin once a week. For 4 weeks, then increase to 0.5 MG once a week for 4 weeks. 01/03/22   Kate Sable, MD  terbinafine (LAMISIL) 1 % cream Apply 1 Application topically 2 (two) times daily. To affected areas of rash under breast and under arms 01/12/22   Moye, [place], MD  valACYclovir (VALTREX) 1000 MG tablet Take 2 tablets by mouth with water, then take 2 more tablets in 12 hours. 01/12/22   Moye, [place], MD    Allergies as of 12/30/2021 - Review Complete 12/30/2021  Allergen Reaction Noted   Lisinopril Swelling 11/04/2014   Latex  Rash 01/07/2015    Family History  Problem Relation Age of Onset   Heart attack Mother    Coronary artery disease Mother    Lung cancer Father    Diabetes Sister    Heart Problems Sister    Uterine cancer Sister    Lung cancer Sister    Stroke Brother    Bone cancer Brother     Social History   Socioeconomic History   Marital status: Legally Separated    Spouse name: Not on file   Number of children: 2   Years of education: Not on file   Highest education level: High school graduate  Occupational History   Occupation: disabled  Tobacco Use   Smoking status: Former     Types: Cigarettes    Quit date: 11/25/2009    Years since quitting: 12.1   Smokeless tobacco: Never  Vaping Use   Vaping Use: Never used  Substance and Sexual Activity   Alcohol use: No    Alcohol/week: 0.0 standard drinks of alcohol   Drug use: No   Sexual activity: Not on file  Other Topics Concern   Not on file  Social History Narrative   Not on file   Social Determinants of Health   Financial Resource Strain: Low Risk  (06/30/2020)   Overall Financial Resource Strain (CARDIA)    Difficulty of Paying Living Expenses: Not hard at all  Food Insecurity: No Food Insecurity (06/30/2020)   Hunger Vital Sign    Worried About Running Out of Food in the Last Year: Never true    [place] in the Last Year: Never true  Transportation Needs: No Transportation Needs (06/30/2020)   PRAPARE - Hydrologist (Medical): No    Lack of Transportation (Non-Medical): No  Physical Activity: Inactive (06/30/2020)   Exercise Vital Sign    Days of Exercise per Week: 0 days    Minutes of Exercise per Session: 0 min  Stress: No Stress Concern Present (06/30/2020)   [place]    Feeling of Stress : Not at all  Social Connections: Moderately Isolated (06/30/2020)   Social Connection and Isolation Panel [NHANES]    Frequency of Communication with Friends and Family: Three times a week    Frequency of Social Gatherings with Friends and Family: More than three times a week    Attends Religious Services: More than 4 times per year    Active Member of Clubs or Organizations: No    Attends Archivist Meetings: Never    Marital Status: Separated  Intimate Partner Violence: Not At Risk (06/30/2020)   Humiliation, Afraid, Rape, and Kick questionnaire    Fear of Current or Ex-Partner: No    Emotionally Abused: No    Physically Abused: No    Sexually Abused: No    Review of Systems: See HPI, otherwise  negative ROS  Physical Exam: BP (!) 160/70   Pulse 81   Temp (!) 97 F (36.1 C) (Temporal)   Resp 20   Ht 5' 2" (1.575 m)   Wt 127 kg   SpO2 100%   BMI 51.21 kg/m  General:   Alert,  pleasant and cooperative in NAD Head:  Normocephalic and atraumatic. Neck:  Supple; no masses or thyromegaly. Lungs:  Clear throughout to auscultation, normal respiratory effort.    Heart:  +S1, +S2, Regular rate and rhythm, No edema. Abdomen:  Soft, nontender and nondistended.  Normal bowel sounds, without guarding, and without rebound.   Neurologic:  Alert and  oriented x4;  grossly normal neurologically.  Impression/Plan: Erienne Y Marland is here for an colonoscopy to be performed for Screening colonoscopy average risk   Risks, benefits, limitations, and alternatives regarding  colonoscopy have been reviewed with the patient.  Questions have been answered.  All parties agreeable.    , MD  01/19/2022, 10:54 AM  

## 2022-01-20 ENCOUNTER — Encounter: Payer: Self-pay | Admitting: Gastroenterology

## 2022-01-20 LAB — SURGICAL PATHOLOGY

## 2022-01-21 ENCOUNTER — Ambulatory Visit
Admission: RE | Admit: 2022-01-21 | Discharge: 2022-01-21 | Disposition: A | Discharge status: Home / Self Care | Payer: Medicare Other | Source: Ambulatory Visit | Attending: Physician Assistant | Admitting: Physician Assistant

## 2022-01-21 DIAGNOSIS — Z1231 Encounter for screening mammogram for malignant neoplasm of breast: Secondary | ICD-10-CM | POA: Diagnosis present

## 2022-01-24 ENCOUNTER — Other Ambulatory Visit: Payer: Self-pay | Admitting: Physician Assistant

## 2022-01-24 ENCOUNTER — Encounter: Payer: Self-pay | Admitting: Primary Care

## 2022-01-24 ENCOUNTER — Telehealth: Payer: Self-pay | Admitting: Physician Assistant

## 2022-01-24 ENCOUNTER — Ambulatory Visit (INDEPENDENT_AMBULATORY_CARE_PROVIDER_SITE_OTHER): Payer: Medicare Other | Admitting: Primary Care

## 2022-01-24 VITALS — BP 140/80 | HR 72 | Temp 98.3°F | Ht 62.0 in | Wt 277.6 lb

## 2022-01-24 DIAGNOSIS — G4733 Obstructive sleep apnea (adult) (pediatric): Secondary | ICD-10-CM | POA: Diagnosis not present

## 2022-01-24 DIAGNOSIS — E119 Type 2 diabetes mellitus without complications: Secondary | ICD-10-CM

## 2022-01-24 NOTE — Telephone Encounter (Signed)
Copied from English [id number]. Topic: Medicare AWV >> Jan 24, 2022  1:52 PM Jae Dire wrote: Reason for CRM:  No answer unable to leave a message for patient to call back and schedule Medicare Annual Wellness Visit (AWV) in office.   If unable to come into the office for AWV,  please offer to do virtually or by telephone.  Last AWV: 06/30/2020  Please schedule at anytime with [facility] Advisor.  30 minute appointment for Virtual or phone 45 minute appointment for in office or Initial virtual/phone  Any questions, please contact me at [phone]

## 2022-01-24 NOTE — Telephone Encounter (Signed)
Medication Refill - Medication: metFORMIN (GLUCOPHAGE) 500 MG tablet  Has the patient contacted their pharmacy? Yes.   (Agent: If no, request that the patient contact the pharmacy for the refill. If patient does not wish to contact the pharmacy document the reason why and proceed with request.) (Agent: If yes, when and what did the pharmacy advise?)  Preferred Pharmacy (with phone number or street name):  [facility] ([facility] [place] [address]  [place] [address]  Phone: [phone] Fax: [phone]   Has the patient been seen for an appointment in the last year OR does the patient have an upcoming appointment? Yes.    Agent: Please be advised that RX refills may take up to 3 business days. We ask that you follow-up with your pharmacy.

## 2022-01-24 NOTE — Patient Instructions (Addendum)
  Sleep apnea is defined as a period of 10 seconds or longer that you stop breathing while sleeping.  This can happen several times throughout the night.  When it happens more than 5 times on average an hour this is a diagnosis of sleep apnea  Mild sleep apnea- 5-15 apneic events an hour Moderate sleep apnea- 15-30 apneic events an hour Severe sleep apnea - over 30 apneic events an hour  Risk of untreated sleep apnea include cardiac arrhythmias, stroke, pulmonary hypertension and diabetes  Treatment options include weight loss, oral appliance, CPAP therapy or referral to ENT for possible surgical options  Recommendations Maintain normal BMI Focus on side sleeping position or elevate head of bed 30 degrees Do not drive experiencing excessive daytime sleepiness or fatigue Do not take sedating medication or drink alcohol in excess prior to bedtime  Orders Split night sleep study (ordered)  Follow-up Please call to schedule follow-up 1 to 2 weeks after completing sleep study to review results and treatment options if needed

## 2022-01-24 NOTE — Assessment & Plan Note (Signed)
-   HX severe OSA, AHI 77.8/hr (original sleep study is not in chart, unknown date). She underwent titration study in February 2019 showing that she needed BIPAP @ 20/15cm h20. She did not tolerate BIPAP and stopped wearing 2 years ago. She has symptoms of snoring, restless sleep and daytime sleepiness. Epworth score 7/24. BMI 50. Needs repeat PSG/ CPAP titration. Ordering split night sleep study.  Reviewed risks of untreated sleep apnea including cardiac arrhythmias, stroke, pulmonary hypertension and diabetes.  We briefly reviewed treatment options including weight loss, oral appliance, CPAP therapy or referral to ENT for possible surgical options.  She is working on weight loss and recently started on Ozempic.  Advised against driving experiencing excessive daytime sleepiness or fatigue.  Follow-up 1 to 2 weeks after sleep study to review results and treatment options if needed.

## 2022-01-24 NOTE — Progress Notes (Signed)
Recent  '@Patient'  ID: Kristin Mora, female    DOB: [birth year], 66 y.o.   MRN: [id number]  Chief Complaint  Patient presents with   sleep consult    Prior sleep study-not wearing cpap. C/o snoring, daytime sleepiness and restless sleep.     Referring provider: Kate Sable, MD  HPI: 66 year old female, former smoker quit in 2011.  Past medical history significant for HTN, chronic airway obstruction, type 2 diabetes, hypercholesteremia.  01/24/2022 Patient presents today for sleep consult. Accompanied by her daughter. Referred by cardiology d/t OSA. She has symptoms of snoring and fragmented sleep. She had PSG that showed severe OSA, AHI 77.8/hr (results/date are not in chart). She had CPAP titration study in February 2019 requiring BIPAP @ 20/15cm h20.  She has not worn BIPAP in 2 years and returned machine. She told me that she had difficulty tolerting mask. She had used a full face mask but never tried any other types of masks. She currently goes to bed between 10 and 11 PM.  It takes her on average 30 minutes to fall asleep.  She wakes up 4-5 times a night to use the restroom.  She starts her day at 9 AM. She is open to re-trying BIPAP if needed. She started on Ozempic to help with weight loss. Epworth score 7. Denies symptoms of narcolepsy, cataplexy and sleep walking.   Sleep questionnaire Symptoms- snoring, restless sleep  Previous sleep study- PSG severe OSA, AHI 77.8/hr. CPAP Titration in Feb 2013 >> AHI 20.3 requiring BIPAP 20/15 cm h20  Typical bedtime- 10-11pm Time to fall asleep- 30 mins Nocturnal awakenings- 4 times Start of day/out of bed- 9am Weight changes in the last 2 years- NA Do you operate heavy machinery- No Do you wear CPAP - No Do you wear oxygen - No Epworth score - 7 Sleep aids or SSRIs - No    Immunization History  Administered Date(s) Administered   Fluad Quad(high Dose 65+) 03/27/2019, 04/10/2020, 04/27/2021   Influenza,inj,Quad PF,6+ Mos  05/26/2015   Pneumococcal Conjugate-13 07/27/2020   Pneumococcal Polysaccharide-23 04/18/2012, 06/25/2019   Tdap 12/27/2010    Past Medical History:  Diagnosis Date   Asthma    Brain aneurysm 04/2007   Diabetes mellitus without complication ([place])    controlled;   HLD (hyperlipidemia)    Hypertension    controlled with medication   Stroke ([place])    Vertigo     Tobacco History: Social History   Tobacco Use  Smoking Status Former   Packs/day: 2.00   Years: 12.00   Total pack years: 24.00   Types: Cigarettes   Quit date: 11/25/2009   Years since quitting: 12.1  Smokeless Tobacco Never   Counseling given: Not Answered   Outpatient Medications Prior to Visit  Medication Sig Dispense Refill   amLODipine (NORVASC) 10 MG tablet Take 1 tablet (10 mg total) by mouth daily. 90 tablet 1   atorvastatin (LIPITOR) 80 MG tablet Take 1 tablet by mouth once daily 90 tablet 0   Blood Pressure Monitoring (BLOOD PRESSURE DIGITAL SOLN) KIT Dispense one automatic/digital blood pressure machine; check blood pressure twice daily in AM and PM 1 kit 0   hydrocortisone 1 % lotion Apply 1 Application topically 2 (two) times daily. For itching 118 mL 1   ibuprofen (ADVIL) 600 MG tablet Take 1 tablet (600 mg total) by mouth every 8 (eight) hours as needed for moderate pain. 30 tablet 0   ketoconazole (NIZORAL) 2 % cream Apply 1 Application topically daily.  On R armpit, under R breast 30 g 2   lidocaine (LIDODERM) 5 % Place 1 patch onto the skin daily. Remove & Discard patch within 12 hours or as directed by MD 30 patch 0   losartan-hydrochlorothiazide (HYZAAR) 100-25 MG tablet TAKE 1 TABLET BY MOUTH ONCE DAILY. 90 tablet 1   metFORMIN (GLUCOPHAGE) 500 MG tablet TAKE 1 TABLET BY MOUTH TWICE DAILY WITH A MEAL. 180 tablet 1   mupirocin ointment (BACTROBAN) 2 % Apply 1 Application topically 3 (three) times daily. Apply to right lower lip 30 g 2   nystatin (MYCOSTATIN/NYSTOP) powder Apply 1 application.  topically 3 (three) times daily as needed. To right armpit and under b/l breasts 30 g 1   oxybutynin (DITROPAN) 5 MG tablet Take 1 tablet (5 mg total) by mouth 2 (two) times daily. 60 tablet 1   Semaglutide,0.25 or 0.5MG/DOS, 2 MG/3ML SOPN Inject 0.25 mg into the skin once a week. For 4 weeks, then increase to 0.5 MG once a week for 4 weeks. 3 mL 1   terbinafine (LAMISIL) 1 % cream Apply 1 Application topically 2 (two) times daily. To affected areas of rash under breast and under arms 30 g 2   valACYclovir (VALTREX) 1000 MG tablet Take 2 tablets by mouth with water, then take 2 more tablets in 12 hours. 4 tablet 0   Vitamin D, Ergocalciferol, (DRISDOL) 1.25 MG (50000 UNIT) CAPS capsule Take 1 capsule by mouth once a week 12 capsule 1   No facility-administered medications prior to visit.      Review of Systems  Review of Systems  Constitutional:  Positive for fatigue.  Respiratory: Negative.    Cardiovascular: Negative.   Psychiatric/Behavioral:  Positive for sleep disturbance.      Physical Exam  BP 140/80 (BP Location: Left Arm, Cuff Size: Large)   Pulse 72   Temp 98.3 F (36.8 C) (Temporal)   Ht '5\' 2"'  (1.575 m)   Wt 277 lb 9.6 oz (125.9 kg)   SpO2 97%   BMI 50.77 kg/m  Physical Exam Constitutional:      Appearance: Normal appearance. She is obese.  HENT:     Head: Normocephalic and atraumatic.     Mouth/Throat:     Mouth: Mucous membranes are moist.     Pharynx: Oropharynx is clear.     Comments: Mallampati class III Cardiovascular:     Rate and Rhythm: Normal rate and regular rhythm.     Comments: RRR Pulmonary:     Effort: Pulmonary effort is normal.     Breath sounds: Normal breath sounds.  Musculoskeletal:        General: Normal range of motion.  Skin:    General: Skin is warm and dry.  Neurological:     General: No focal deficit present.     Mental Status: She is alert and oriented to person, place, and time. Mental status is at baseline.  Psychiatric:         Mood and Affect: Mood normal.        Behavior: Behavior normal.        Thought Content: Thought content normal.        Judgment: Judgment normal.      Lab Results:  CBC    Component Value Date/Time   WBC 11.5 (H) 07/27/2020 1356   WBC 10.5 04/03/2017 0150   RBC 4.98 07/27/2020 1356   RBC 5.37 (H) 04/03/2017 0150   HGB 14.3 07/27/2020 1356   HCT 44.2 07/27/2020  1356   PLT 315 07/27/2020 1356   MCV 89 07/27/2020 1356   MCV 88 10/01/2014 0757   MCH 28.7 07/27/2020 1356   MCH 28.4 04/03/2017 0150   MCHC 32.4 07/27/2020 1356   MCHC 33.6 04/03/2017 0150   RDW 14.9 07/27/2020 1356   RDW 15.8 (H) 10/01/2014 0757   LYMPHSABS 2.4 07/27/2020 1356   MONOABS 0.9 04/03/2017 0150   EOSABS 0.3 07/27/2020 1356   BASOSABS 0.0 07/27/2020 1356    BMET    Component Value Date/Time   NA 143 09/01/2021 1441   NA 140 10/01/2014 0757   K 4.0 09/01/2021 1441   K 3.7 10/01/2014 0757   CL 101 09/01/2021 1441   CL 100 (L) 10/01/2014 0757   CO2 26 09/01/2021 1441   CO2 31 10/01/2014 0757   GLUCOSE 103 (H) 09/01/2021 1441   GLUCOSE 128 (H) 04/03/2017 0150   GLUCOSE 124 (H) 10/01/2014 0757   BUN 16 09/01/2021 1441   BUN 23 (H) 10/01/2014 0757   CREATININE 0.73 09/01/2021 1441   CREATININE 0.81 10/01/2014 0757   CALCIUM 9.7 09/01/2021 1441   CALCIUM 9.5 10/01/2014 0757   GFRNONAA 82 07/27/2020 1356   GFRNONAA >60 10/01/2014 0757   GFRAA 95 07/27/2020 1356   GFRAA >60 10/01/2014 0757    BNP No results found for: "BNP"  ProBNP No results found for: "PROBNP"  Imaging: No results found.   Assessment & Plan:   OSA (obstructive sleep apnea) - HX severe OSA, AHI 77.8/hr (original sleep study is not in chart, unknown date). She underwent titration study in February 2019 showing that she needed BIPAP @ 20/15cm h20. She did not tolerate BIPAP and stopped wearing 2 years ago. She has symptoms of snoring, restless sleep and daytime sleepiness. Epworth score 7/24. BMI 50. Needs repeat  PSG/ CPAP titration. Ordering split night sleep study.  Reviewed risks of untreated sleep apnea including cardiac arrhythmias, stroke, pulmonary hypertension and diabetes.  We briefly reviewed treatment options including weight loss, oral appliance, CPAP therapy or referral to ENT for possible surgical options.  She is working on weight loss and recently started on Ozempic.  Advised against driving experiencing excessive daytime sleepiness or fatigue.  Follow-up 1 to 2 weeks after sleep study to review results and treatment options if needed.  Martyn Ehrich, NP 01/24/2022

## 2022-01-25 ENCOUNTER — Inpatient Hospital Stay
Admission: RE | Admit: 2022-01-25 | Discharge: 2022-01-25 | Disposition: A | Discharge status: Home / Self Care | Payer: Self-pay | Source: Ambulatory Visit | Attending: *Deleted | Admitting: *Deleted

## 2022-01-25 ENCOUNTER — Ambulatory Visit (INDEPENDENT_AMBULATORY_CARE_PROVIDER_SITE_OTHER): Payer: Medicare Other | Admitting: Dermatology

## 2022-01-25 ENCOUNTER — Other Ambulatory Visit: Payer: Self-pay | Admitting: *Deleted

## 2022-01-25 DIAGNOSIS — Z1231 Encounter for screening mammogram for malignant neoplasm of breast: Secondary | ICD-10-CM

## 2022-01-25 DIAGNOSIS — L304 Erythema intertrigo: Secondary | ICD-10-CM | POA: Diagnosis not present

## 2022-01-25 DIAGNOSIS — L732 Hidradenitis suppurativa: Secondary | ICD-10-CM

## 2022-01-25 DIAGNOSIS — B009 Herpesviral infection, unspecified: Secondary | ICD-10-CM

## 2022-01-25 DIAGNOSIS — L089 Local infection of the skin and subcutaneous tissue, unspecified: Secondary | ICD-10-CM | POA: Diagnosis not present

## 2022-01-25 MED ORDER — VALACYCLOVIR HCL 1 G PO TABS: ORAL_TABLET | ORAL | 1 refills | Status: DC

## 2022-01-25 MED ORDER — MICONAZOLE-ZINC OXIDE-PETROLAT 0.25-15-81.35 % EX OINT: TOPICAL_OINTMENT | CUTANEOUS | 2 refills | Status: DC

## 2022-01-25 NOTE — Telephone Encounter (Signed)
Requested medication (s) are due for refill today: yes  Requested medication (s) are on the active medication list: yes  Last refill:  08/25/21 #180 with 1 RF  Future visit scheduled: 02/09/22, seen 12/29/21  Notes to clinic:  Failed protocol of labs within 12 months, CBC 06/2020, ordered 04/27/2021 but not drawn, has upcoming appt, please assess.       Requested Prescriptions  Pending Prescriptions Disp Refills   metFORMIN (GLUCOPHAGE) 500 MG tablet 180 tablet 1    Sig: TAKE 1 TABLET BY MOUTH TWICE DAILY WITH A MEAL.     Endocrinology:  Diabetes - Biguanides Failed - 01/24/2022 10:12 AM      Failed - B12 Level in normal range and within 720 days    No results found for: "VITAMINB12"       Failed - CBC within normal limits and completed in the last 12 months    WBC  Date Value Ref Range Status  07/27/2020 11.5 (H) 3.4 - 10.8 x10E3/uL Final  04/03/2017 10.5 3.6 - 11.0 K/uL Final   RBC  Date Value Ref Range Status  07/27/2020 4.98 3.77 - 5.28 x10E6/uL Final  04/03/2017 5.37 (H) 3.80 - 5.20 MIL/uL Final   Hemoglobin  Date Value Ref Range Status  07/27/2020 14.3 11.1 - 15.9 g/dL Final   Hematocrit  Date Value Ref Range Status  07/27/2020 44.2 34.0 - 46.6 % Final   MCHC  Date Value Ref Range Status  07/27/2020 32.4 31.5 - 35.7 g/dL Final  04/03/2017 33.6 32.0 - 36.0 g/dL Final   [facility]  Date Value Ref Range Status  07/27/2020 28.7 26.6 - 33.0 pg Final  04/03/2017 28.4 26.0 - 34.0 pg Final   MCV  Date Value Ref Range Status  07/27/2020 89 79 - 97 fL Final  10/01/2014 88 80 - 100 fL Final   No results found for: "PLTCOUNTKUC", "LABPLAT", "POCPLA" RDW  Date Value Ref Range Status  07/27/2020 14.9 11.7 - 15.4 % Final  10/01/2014 15.8 (H) 11.5 - 14.5 % Final         Passed - Cr in normal range and within 360 days    Creatinine  Date Value Ref Range Status  10/01/2014 0.81 mg/dL Final    Comment:    0.44-1.00 NOTE: New Reference Range  09/02/14    Creatinine, Ser   Date Value Ref Range Status  09/01/2021 0.73 0.57 - 1.00 mg/dL Final         Passed - HBA1C is between 0 and 7.9 and within 180 days    Hemoglobin A1C  Date Value Ref Range Status  08/25/2021 6.1 (A) 4.0 - 5.6 % Final  06/29/2016 6.5  Final   Hgb A1c MFr Bld  Date Value Ref Range Status  07/27/2020 6.9 (H) 4.8 - 5.6 % Final    Comment:             Prediabetes: 5.7 - 6.4          Diabetes: >6.4          Glycemic control for adults with diabetes: <7.0          Passed - eGFR in normal range and within 360 days    EGFR (African American)  Date Value Ref Range Status  10/01/2014 >60  Final   GFR calc Af Amer  Date Value Ref Range Status  07/27/2020 95 >59 mL/min/1.73 Final    Comment:    **In accordance with recommendations from the NKF-ASN Task force,**  [facility] is in the process of updating its eGFR calculation to the   2021 CKD-EPI creatinine equation that estimates kidney function   without a race variable.    EGFR (Non-African Amer.)  Date Value Ref Range Status  10/01/2014 >60  Final    Comment:    eGFR values <57m/min/1.73 m2 may be an indication of chronic kidney disease (CKD). Calculated eGFR is useful in patients with stable renal function. The eGFR calculation will not be reliable in acutely ill patients when serum creatinine is changing rapidly. It is not useful in patients on dialysis. The eGFR calculation may not be applicable to patients at the low and high extremes of body sizes, pregnant women, and vegetarians.    GFR calc non Af Amer  Date Value Ref Range Status  07/27/2020 82 >59 mL/min/1.73 Final   eGFR  Date Value Ref Range Status  09/01/2021 90 >59 mL/min/1.73 Final         Passed - Valid encounter within last 6 months    Recent Outpatient Visits           [facility] [place] [facility]   1 month ago Hypertension associated with diabetes ([facility]   [facility]  [facility]-C   5 months ago Type 2 diabetes mellitus without complication, without long-term current use of insulin ([facility]   [facility] [place] PA-C   9 months ago Type 2 diabetes mellitus without complication, without long-term current use of insulin ([facility]   [facility]   1 year ago Fall, subsequent encounter   [facility] Sorrel [place]      Future Appointments             In 2 weeks DThedore Mins LRia Comment PA-C [facility] [place]  In 1 month [place], BAaron Edelman MD [facility] LG[facility]

## 2022-01-25 NOTE — Patient Instructions (Signed)
Due to recent changes in healthcare laws, you may see results of your pathology and/or laboratory studies on MyChart before the doctors have had a chance to review them. We understand that in some cases there may be results that are confusing or concerning to you. Please understand that not all results are received at the same time and often the doctors may need to interpret multiple results in order to provide you with the best plan of care or course of treatment. Therefore, we ask that you please give us 2 business days to thoroughly review all your results before contacting the office for clarification. Should we see a critical lab result, you will be contacted sooner.   If You Need Anything After Your Visit  If you have any questions or concerns for your doctor, please call our main line at [phone] and press option 4 to reach your doctor's medical assistant. If no one answers, please leave a voicemail as directed and we will return your call as soon as possible. Messages left after 4 pm will be answered the following business day.   You may also send us a message via MyChart. We typically respond to MyChart messages within 1-2 business days.  For prescription refills, please ask your pharmacy to contact our office. Our fax number is [phone].  If you have an urgent issue when the clinic is closed that cannot wait until the next business day, you can page your doctor at the number below.    Please note that while we do our best to be available for urgent issues outside of office hours, we are not available 24/7.   If you have an urgent issue and are unable to reach us, you may choose to seek medical care at your doctor's office, retail clinic, urgent care center, or emergency room.  If you have a medical emergency, please immediately call 911 or go to the emergency department.  Pager Numbers  - Dr. Kowalski: [phone]  - Dr. Moye: [phone]  - Dr. Stewart:  [phone]  In the event of inclement weather, please call our main line at [phone] for an update on the status of any delays or closures.  Dermatology Medication Tips: Please keep the boxes that topical medications come in in order to help keep track of the instructions about where and how to use these. Pharmacies typically print the medication instructions only on the boxes and not directly on the medication tubes.   If your medication is too expensive, please contact our office at [phone] option 4 or send us a message through MyChart.   We are unable to tell what your co-pay for medications will be in advance as this is different depending on your insurance coverage. However, we may be able to find a substitute medication at lower cost or fill out paperwork to get insurance to cover a needed medication.   If a prior authorization is required to get your medication covered by your insurance company, please allow us 1-2 business days to complete this process.  Drug prices often vary depending on where the prescription is filled and some pharmacies may offer cheaper prices.  The website www.goodrx.com contains coupons for medications through different pharmacies. The prices here do not account for what the cost may be with help from insurance (it may be cheaper with your insurance), but the website can give you the price if you did not use any insurance.  - You can print the associated coupon and take it with   your prescription to the pharmacy.  - You may also stop by our office during regular business hours and pick up a GoodRx coupon card.  - If you need your prescription sent electronically to a different pharmacy, notify our office through [facility] MyChart or by phone at [phone] option 4.     Si Usted Necesita Algo Despus de Su Visita  Tambin puede enviarnos un mensaje a travs de MyChart. Por lo general respondemos a los mensajes de MyChart en el transcurso de 1 a 2  das hbiles.  Para renovar recetas, por favor pida a su farmacia que se ponga en contacto con nuestra oficina. Nuestro nmero de fax es el [phone].  Si tiene un asunto urgente cuando la clnica est cerrada y que no puede esperar hasta el siguiente da hbil, puede llamar/localizar a su doctor(a) al nmero que aparece a continuacin.   Por favor, tenga en cuenta que aunque hacemos todo lo posible para estar disponibles para asuntos urgentes fuera del horario de oficina, no estamos disponibles las 24 horas del da, los 7 das de la semana.   Si tiene un problema urgente y no puede comunicarse con nosotros, puede optar por buscar atencin mdica  en el consultorio de su doctor(a), en una clnica privada, en un centro de atencin urgente o en una sala de emergencias.  Si tiene una emergencia mdica, por favor llame inmediatamente al 911 o vaya a la sala de emergencias.  Nmeros de bper  - Dr. Kowalski: [phone]  - Dra. Moye: [phone]  - Dra. Stewart: [phone]  En caso de inclemencias del tiempo, por favor llame a nuestra lnea principal al [phone] para una actualizacin sobre el estado de cualquier retraso o cierre.  Consejos para la medicacin en dermatologa: Por favor, guarde las cajas en las que vienen los medicamentos de uso tpico para ayudarle a seguir las instrucciones sobre dnde y cmo usarlos. Las farmacias generalmente imprimen las instrucciones del medicamento slo en las cajas y no directamente en los tubos del medicamento.   Si su medicamento es muy caro, por favor, pngase en contacto con nuestra oficina llamando al [phone] y presione la opcin 4 o envenos un mensaje a travs de MyChart.   No podemos decirle cul ser su copago por los medicamentos por adelantado ya que esto es diferente dependiendo de la cobertura de su seguro. Sin embargo, es posible que podamos encontrar un medicamento sustituto a menor costo o llenar un formulario para que el  seguro cubra el medicamento que se considera necesario.   Si se requiere una autorizacin previa para que su compaa de seguros cubra su medicamento, por favor permtanos de 1 a 2 das hbiles para completar este proceso.  Los precios de los medicamentos varan con frecuencia dependiendo del lugar de dnde se surte la receta y alguna farmacias pueden ofrecer precios ms baratos.  El sitio web www.goodrx.com tiene cupones para medicamentos de diferentes farmacias. Los precios aqu no tienen en cuenta lo que podra costar con la ayuda del seguro (puede ser ms barato con su seguro), pero el sitio web puede darle el precio si no utiliz ningn seguro.  - Puede imprimir el cupn correspondiente y llevarlo con su receta a la farmacia.  - Tambin puede pasar por nuestra oficina durante el horario de atencin regular y recoger una tarjeta de cupones de GoodRx.  - Si necesita que su receta se enve electrnicamente a una farmacia diferente, informe a nuestra oficina a travs de MyChart de [place]   o por telfono llamando al [phone] y presione la opcin 4.  

## 2022-01-25 NOTE — Progress Notes (Unsigned)
   Follow-Up Visit   Subjective  Kristin Mora is a 66 y.o. female who presents for the following: Follow-up (2 weeks f/u rash on beneath the breast and right axilla treating with Ketoconazole cream and Hydrocortisone cream with a good response ).   The following portions of the chart were reviewed this encounter and updated as appropriate:   Tobacco  Allergies  Meds  Problems  Med Hx  Surg Hx  Fam Hx      Review of Systems:  No other skin or systemic complaints except as noted in HPI or Assessment and Plan.  Objective  Well appearing patient in no apparent distress; mood and affect are within normal limits.  A focused examination was performed including left arm, right arm,abdomen,lip. Relevant physical exam findings are noted in the Assessment and Plan.  Right Axilla Old scarring   right inframammary, right axilla Erythematous patch with fissure at the right inframammary, erythematous patch at the right axilla   right lower vermillion lip, abdomen, b/l upper thighs Clear skin    Assessment & Plan  Hidradenitis suppurativa Right Axilla  Old scarring from HS. Not currently active.  Intertrigo right inframammary, right axilla  Intertrigo is a chronic recurrent rash that occurs in skin fold areas that may be associated with friction; heat; moisture; yeast; fungus; and bacteria.  It is exacerbated by increased movement / activity; sweating; and higher atmospheric temperature.   Start Miconazole-Zinc oxide-Petrolat ointment apply to affected skin until clear D/c ketoconazole cream and hydrocortisone cream  Related Medications ketoconazole (NIZORAL) 2 % cream Apply 1 Application topically daily. On R armpit, under R breast  hydrocortisone 1 % lotion Apply 1 Application topically 2 (two) times daily. For itching  Miconazole-Zinc Oxide-Petrolat 0.25-15-81.35 % OINT Apply to affected skin twice a day until clear  Local infection of skin and subcutaneous  tissue  Related Medications mupirocin ointment (BACTROBAN) 2 % Apply 1 Application topically 3 (three) times daily. Apply to right lower lip  Herpes simplex right lower vermillion lip, abdomen, b/l upper thighs  Chronic condition with duration or expected duration over one year. Currently well-controlled.  Herpes Simplex Virus = Cold Sores = Fever Blisters is a chronic recurring blistering; scabbing sore-producing viral infection that is recurrent usually in the same area triggered by stress, sun/UV exposure and trauma.  It is infectious and can be spread from person to person by direct contact.  It is not curable, but is treatable with topical and oral medication.   At first sign of new cold sore, take valacyclovir 2 grams, repeat 2 grams 12 hours later. Take with a glass of water.    Related Medications valACYclovir (VALTREX) 1000 MG tablet Take 2 tablets by mouth with water, then take 2 more tablets in 12 hours.   Return if symptoms worsen or fail to improve.  I, Kristin Mora, CMA, am acting as scribe for [name], MD .   Documentation: I have reviewed the above documentation for accuracy and completeness, and I agree with the above.  [name], MD

## 2022-01-26 ENCOUNTER — Telehealth: Payer: Self-pay

## 2022-01-26 ENCOUNTER — Encounter: Payer: Self-pay | Admitting: Dermatology

## 2022-01-26 NOTE — Telephone Encounter (Signed)
Patient's daughter called regarding Miconazole-Zinc oxide-Petrolat ointment not being covered by insurance.   Any alternative that can be sent in for patient?

## 2022-01-26 NOTE — Telephone Encounter (Signed)
Recommend ketoconazole cream (she has) followed by triple paste or desitin twice a day until rash is clear. Thank you!

## 2022-01-27 NOTE — Telephone Encounter (Signed)
Patient advised of information per Dr. Moye. aw 

## 2022-01-27 NOTE — Progress Notes (Signed)
Reviewed and agree with assessment/plan.   Chesley Mires, MD [facility] Pulmonary/Critical Care 01/27/2022, 9:57 AM Pager:  [phone]

## 2022-02-03 ENCOUNTER — Ambulatory Visit (INDEPENDENT_AMBULATORY_CARE_PROVIDER_SITE_OTHER): Payer: Medicare Other

## 2022-02-03 DIAGNOSIS — R0602 Shortness of breath: Secondary | ICD-10-CM

## 2022-02-03 LAB — ECHOCARDIOGRAM COMPLETE
AR max vel: 2.43 cm2
AV Area VTI: 2.56 cm2
AV Area mean vel: 2.22 cm2
AV Mean grad: 7 mmHg
AV Peak grad: 12.4 mmHg
AV Vena cont: 0.2 cm
Ao pk vel: 1.76 m/s
Area-P 1/2: 3.68 cm2
P 1/2 time: 813 msec
S' Lateral: 3.1 cm

## 2022-02-03 MED ORDER — PERFLUTREN LIPID MICROSPHERE
1.0000 mL | INTRAVENOUS | Status: AC | PRN
  Administered 2022-02-03: 2 mL via INTRAVENOUS

## 2022-02-08 ENCOUNTER — Telehealth: Payer: Self-pay | Admitting: Physician Assistant

## 2022-02-08 NOTE — Telephone Encounter (Signed)
Copied from [place] [id number]. Topic: Medicare AWV >> Feb 08, 2022 11:44 AM Jae Dire wrote: Reason for CRM:  No answer unable to leave a message for patient to call back and schedule Medicare Annual Wellness Visit (AWV) in office.   If unable to come into the office for AWV,  please offer to do virtually or by telephone.  Last AWV: 06/30/2020  Please schedule at anytime with [facility] Health Advisor.  30 minute appointment for Virtual or phone 45 minute appointment for in office or Initial virtual/phone  Any questions, please contact me at [phone]

## 2022-02-09 ENCOUNTER — Ambulatory Visit: Payer: Medicare Other | Admitting: Physician Assistant

## 2022-02-09 NOTE — Progress Notes (Deleted)
      Established patient visit   Patient: Kristin Mora   DOB: Aug 04, 1955   66 y.o. Female  MRN: [id number] Visit Date: 02/09/2022  Today's healthcare provider: [name], PA-C   No chief complaint on file.  Subjective    HPI  ***  Medications: Outpatient Medications Prior to Visit  Medication Sig   amLODipine (NORVASC) 10 MG tablet Take 1 tablet (10 mg total) by mouth daily.   atorvastatin (LIPITOR) 80 MG tablet Take 1 tablet by mouth once daily   Blood Pressure Monitoring (BLOOD PRESSURE DIGITAL SOLN) KIT Dispense one automatic/digital blood pressure machine; check blood pressure twice daily in AM and PM   hydrocortisone 1 % lotion Apply 1 Application topically 2 (two) times daily. For itching   ibuprofen (ADVIL) 600 MG tablet Take 1 tablet (600 mg total) by mouth every 8 (eight) hours as needed for moderate pain.   ketoconazole (NIZORAL) 2 % cream Apply 1 Application topically daily. On R armpit, under R breast   lidocaine (LIDODERM) 5 % Place 1 patch onto the skin daily. Remove & Discard patch within 12 hours or as directed by MD   losartan-hydrochlorothiazide (HYZAAR) 100-25 MG tablet TAKE 1 TABLET BY MOUTH ONCE DAILY.   metFORMIN (GLUCOPHAGE) 500 MG tablet TAKE 1 TABLET BY MOUTH TWICE DAILY WITH A MEAL.   Miconazole-Zinc Oxide-Petrolat 0.25-15-81.35 % OINT Apply to affected skin twice a day until clear   mupirocin ointment (BACTROBAN) 2 % Apply 1 Application topically 3 (three) times daily. Apply to right lower lip   oxybutynin (DITROPAN) 5 MG tablet Take 1 tablet (5 mg total) by mouth 2 (two) times daily.   Semaglutide,0.25 or 0.5MG /DOS, 2 MG/3ML SOPN Inject 0.25 mg into the skin once a week. For 4 weeks, then increase to 0.5 MG once a week for 4 weeks.   terbinafine (LAMISIL) 1 % cream Apply 1 Application topically 2 (two) times daily. To affected areas of rash under breast and under arms   valACYclovir (VALTREX) 1000 MG tablet Take 2 tablets by mouth with water, then  take 2 more tablets in 12 hours.   Vitamin D, Ergocalciferol, (DRISDOL) 1.25 MG (50000 UNIT) CAPS capsule Take 1 capsule by mouth once a week   No facility-administered medications prior to visit.    Review of Systems  {Labs  Heme  Chem  Endocrine  Serology  Results Review (optional):23779}   Objective    There were no vitals taken for this visit. {Show previous vital signs (optional):23777}  Physical Exam  ***  No results found for any visits on 02/09/22.  Assessment & Plan     ***  No follow-ups on file.      {provider attestation***:1}   [name], PA-C  [facility] [phone] (phone) [phone] (fax)  [place]

## 2022-02-22 ENCOUNTER — Other Ambulatory Visit: Payer: Self-pay | Admitting: Physician Assistant

## 2022-02-22 ENCOUNTER — Other Ambulatory Visit: Payer: Self-pay | Admitting: Family Medicine

## 2022-02-22 DIAGNOSIS — I152 Hypertension secondary to endocrine disorders: Secondary | ICD-10-CM

## 2022-02-25 ENCOUNTER — Encounter: Payer: Self-pay | Admitting: Cardiology

## 2022-02-25 ENCOUNTER — Ambulatory Visit: Payer: Medicare Other | Attending: Cardiology | Admitting: Cardiology

## 2022-02-25 VITALS — BP 120/60 | HR 70 | Ht 62.0 in | Wt 271.0 lb

## 2022-02-25 DIAGNOSIS — E7800 Pure hypercholesterolemia, unspecified: Secondary | ICD-10-CM

## 2022-02-25 DIAGNOSIS — G4733 Obstructive sleep apnea (adult) (pediatric): Secondary | ICD-10-CM | POA: Diagnosis not present

## 2022-02-25 DIAGNOSIS — I10 Essential (primary) hypertension: Secondary | ICD-10-CM | POA: Diagnosis not present

## 2022-02-25 DIAGNOSIS — R0602 Shortness of breath: Secondary | ICD-10-CM

## 2022-02-25 NOTE — Progress Notes (Signed)
Cardiology Office Note:    Date:  02/25/2022   ID:  Kristin Mora, DOB 19-Jul-1955, MRN [id number]  PCP:  [name], [place] Prov[facility] Cardiologist:  Kate Sable, MD     Referring MD: [name], PA-C   Chief Complaint  Patient presents with   Other    Follow up post ECHO. Meds reviewed verbally with patient.     History of Present Illness:    Kristin Mora is a 66 y.o. female with a hx of hypertension, hyperlipidemia, diabetes, CVA-hemorrhagic, brain aneurysm s/p repair 2008, OSA, morbid obesity who presents for follow-up.  Previously seen due to elevated blood pressures and fatigue.  BP elevation was due to medication noncompliance, patient endorsed now taking medications as prescribed.  Was also referred to see sleep specialist, repeat sleep study is being planned.  She will likely need CPAP.  Echo obtained due to shortness of breath.  Ozempic was started to help with weight loss.  She has lost about 6 pounds since last visit, very excited about weight loss, states feeling better.  Tolerating all meds without any adverse effects.  Has no concerns at this time.  Prior notes Echo 01/2022 EF 55 to 60%  Past Medical History:  Diagnosis Date   Asthma    Brain aneurysm 04/2007   Diabetes mellitus without complication ([place])    controlled;   HLD (hyperlipidemia)    Hypertension    controlled with medication   Stroke [facility])    Vertigo     Past Surgical History:  Procedure Laterality Date   COLONOSCOPY WITH PROPOFOL N/A 01/19/2022   Procedure: COLONOSCOPY WITH PROPOFOL;  Surgeon: Jonathon Bellows, MD;  Location: [facility] ENDOSCOPY;  Service: Gastroenterology;  Laterality: N/A;   CRANIOTOMY  04/28/2007   KNEE SURGERY     TONSILLECTOMY     TONSILLECTOMY AND ADENOIDECTOMY     TUBAL LIGATION      Current Medications: Current Meds  Medication Sig   amLODipine (NORVASC) 10 MG tablet Take 1 tablet by mouth once daily   atorvastatin (LIPITOR) 80 MG  tablet Take 1 tablet by mouth once daily   Blood Pressure Monitoring (BLOOD PRESSURE DIGITAL SOLN) KIT Dispense one automatic/digital blood pressure machine; check blood pressure twice daily in AM and PM   hydrocortisone 1 % lotion Apply 1 Application topically 2 (two) times daily. For itching   ibuprofen (ADVIL) 600 MG tablet Take 1 tablet (600 mg total) by mouth every 8 (eight) hours as needed for moderate pain.   ketoconazole (NIZORAL) 2 % cream Apply 1 Application topically daily. On R armpit, under R breast   lidocaine (LIDODERM) 5 % Place 1 patch onto the skin daily. Remove & Discard patch within 12 hours or as directed by MD   losartan-hydrochlorothiazide (HYZAAR) 100-25 MG tablet TAKE 1 TABLET BY MOUTH ONCE DAILY.   metFORMIN (GLUCOPHAGE) 500 MG tablet TAKE 1 TABLET BY MOUTH TWICE DAILY WITH A MEAL.   Miconazole-Zinc Oxide-Petrolat 0.25-15-81.35 % OINT Apply to affected skin twice a day until clear   mupirocin ointment (BACTROBAN) 2 % Apply 1 Application topically 3 (three) times daily. Apply to right lower lip   oxybutynin (DITROPAN) 5 MG tablet Take 1 tablet (5 mg total) by mouth 2 (two) times daily.   Semaglutide,0.25 or 0.5MG/DOS, 2 MG/3ML SOPN Inject 0.25 mg into the skin once a week. For 4 weeks, then increase to 0.5 MG once a week for 4 weeks.   terbinafine (LAMISIL) 1 % cream  Apply 1 Application topically 2 (two) times daily. To affected areas of rash under breast and under arms   valACYclovir (VALTREX) 1000 MG tablet Take 2 tablets by mouth with water, then take 2 more tablets in 12 hours.   Vitamin D, Ergocalciferol, (DRISDOL) 1.25 MG (50000 UNIT) CAPS capsule Take 1 capsule by mouth once a week     Allergies:   Lisinopril and Latex   Social History   Socioeconomic History   Marital status: Legally Separated    Spouse name: Not on file   Number of children: 2   Years of education: Not on file   Highest education level: High school graduate  Occupational History    Occupation: disabled  Tobacco Use   Smoking status: Former    Packs/day: 2.00    Years: 12.00    Total pack years: 24.00    Types: Cigarettes    Quit date: 11/25/2009    Years since quitting: 12.2   Smokeless tobacco: Never  Vaping Use   Vaping Use: Never used  Substance and Sexual Activity   Alcohol use: No    Alcohol/week: 0.0 standard drinks of alcohol   Drug use: No   Sexual activity: Not on file  Other Topics Concern   Not on file  Social History Narrative   Not on file   Social Determinants of Health   Financial Resource Strain: Low Risk  (06/30/2020)   Overall Financial Resource Strain (CARDIA)    Difficulty of Paying Living Expenses: Not hard at all  Food Insecurity: No Food Insecurity (06/30/2020)   Hunger Vital Sign    Worried About Running Out of Food in the Last Year: Never true    [place] in the Last Year: Never true  Transportation Needs: No Transportation Needs (06/30/2020)   PRAPARE - Hydrologist (Medical): No    Lack of Transportation (Non-Medical): No  Physical Activity: Inactive (06/30/2020)   Exercise Vital Sign    Days of Exercise per Week: 0 days    Minutes of Exercise per Session: 0 min  Stress: No Stress Concern Present (06/30/2020)   [place]    Feeling of Stress : Not at all  Social Connections: Moderately Isolated (06/30/2020)   Social Connection and Isolation Panel [NHANES]    Frequency of Communication with Friends and Family: Three times a week    Frequency of Social Gatherings with Friends and Family: More than three times a week    Attends Religious Services: More than 4 times per year    Active Member of [employer] or Organizations: No    Attends Archivist Meetings: Never    Marital Status: Separated     Family History: The patient's family history includes Bone cancer in her brother; Coronary artery disease in her mother; Diabetes in  her sister; Heart Problems in her sister; Heart attack in her mother; Lung cancer in her father and sister; Stroke in her brother; Uterine cancer in her sister.  ROS:   Please see the history of present illness.     All other systems reviewed and are negative.  EKGs/Labs/Other Studies Reviewed:    The following studies were reviewed today:   EKG:  EKG not  ordered today.  Recent Labs: 09/01/2021: ALT 22; BUN 16; Creatinine, Ser 0.73; Potassium 4.0; Sodium 143  Recent Lipid Panel    Component Value Date/Time   CHOL 167 09/01/2021 1441  TRIG 124 09/01/2021 1441   HDL 34 (L) 09/01/2021 1441   CHOLHDL 4.9 (H) 09/01/2021 1441   CHOLHDL 5.1 09/16/2015 0613   VLDL 23 09/16/2015 0613   LDLCALC 110 (H) 09/01/2021 1441     Risk Assessment/Calculations:          Physical Exam:    VS:  BP 120/60 (BP Location: Right Arm, Patient Position: Sitting, Cuff Size: Large)   Pulse 70   Ht '5\' 2"'  (1.575 m)   Wt 271 lb (122.9 kg)   SpO2 95%   BMI 49.57 kg/m     Wt Readings from Last 3 Encounters:  02/25/22 271 lb (122.9 kg)  01/24/22 277 lb 9.6 oz (125.9 kg)  01/19/22 280 lb (127 kg)     GEN:  Well nourished, well developed in no acute distress HEENT: Normal NECK: No JVD; No carotid bruits CARDIAC: RRR, no murmurs, rubs, gallops RESPIRATORY:  Clear to auscultation without rales, wheezing or rhonchi  ABDOMEN: Soft, non-tender, non-distended MUSCULOSKELETAL:  No edema; No deformity  SKIN: Warm and dry NEUROLOGIC:  Alert and oriented x 3 PSYCHIATRIC:  Normal affect   ASSESSMENT:    1. Primary hypertension   2. Pure hypercholesterolemia   3. Morbid obesity ([place])   4. OSA (obstructive sleep apnea)   5. Shortness of breath     PLAN:    In order of problems listed above:  Hypertension, BP controlled.  Continue HCTZ, losartan, amlodipine. Hyperlipidemia, continue high intensity statin 80 mg daily.  Repeat lipid panel in 6 months Morbid obesity, diabetic.  Lost 6 pounds since  last visit, continue Ozempic.  Continue low-calorie diet. OSA, sleep study being planned by sleep specialist.  Advised to follow recommendations. Shortness of breath, likely from morbid obesity and OSA.  Echo with normal EF.  Management of obesity and OSA as above.  Follow-up in 6 months.     Medication Adjustments/Labs and Tests Ordered: Current medicines are reviewed at length with the patient today.  Concerns regarding medicines are outlined above.  Orders Placed This Encounter  Procedures   Lipid panel   No orders of the defined types were placed in this encounter.   Patient Instructions  Medication Instructions:   Your physician recommends that you continue on your current medications as directed. Please refer to the Current Medication list given to you today.   *If you need a refill on your cardiac medications before your next appointment, please call your pharmacy*   Lab Work:  Your physician recommends that you return for a FASTING lipid profile: IN 6 MONTHS prior to your follow up appointment  - You will need to be fasting. Please do not have anything to eat or drink after midnight the morning you have the lab work. You may only have water or black coffee with no cream or sugar.   - Please go to the [facility]. You will check in at the front desk to the right as you walk into the atrium. Valet Parking is offered if needed. - No appointment needed. You may go any day between 7 am and 6 pm.     Follow-Up: At [facility], you and your health needs are our priority.  As part of our continuing mission to provide you with exceptional heart care, we have created designated Provider Care Teams.  These Care Teams include your primary Cardiologist (physician) and Advanced Practice Providers (APPs -  Physician Assistants and Nurse Practitioners) who all work together to provide you  with the care you need, when you need it.  We recommend signing up for the patient  portal called "MyChart".  Sign up information is provided on this After Visit Summary.  MyChart is used to connect with patients for Virtual Visits (Telemedicine).  Patients are able to view lab/test results, encounter notes, upcoming appointments, etc.  Non-urgent messages can be sent to your provider as well.   To learn more about what you can do with MyChart, go to [email or web address].    Your next appointment:   6 month(s)  The format for your next appointment:   In Person  Provider:   Kate Sable, MD    Other Instructions   Important Information About Sugar         Signed, Kate Sable, MD  02/25/2022 11:25 AM    [place]

## 2022-02-25 NOTE — Patient Instructions (Addendum)
Medication Instructions:   Your physician recommends that you continue on your current medications as directed. Please refer to the Current Medication list given to you today.   *If you need a refill on your cardiac medications before your next appointment, please call your pharmacy*   Lab Work:  Your physician recommends that you return for a FASTING lipid profile: IN 6 MONTHS prior to your follow up appointment  - You will need to be fasting. Please do not have anything to eat or drink after midnight the morning you have the lab work. You may only have water or black coffee with no cream or sugar.   - Please go to the [facility]. You will check in at the front desk to the right as you walk into the atrium. Valet Parking is offered if needed. - No appointment needed. You may go any day between 7 am and 6 pm.     Follow-Up: At [facility], you and your health needs are our priority.  As part of our continuing mission to provide you with exceptional heart care, we have created designated Provider Care Teams.  These Care Teams include your primary Cardiologist (physician) and Advanced Practice Providers (APPs -  Physician Assistants and Nurse Practitioners) who all work together to provide you with the care you need, when you need it.  We recommend signing up for the patient portal called "MyChart".  Sign up information is provided on this After Visit Summary.  MyChart is used to connect with patients for Virtual Visits (Telemedicine).  Patients are able to view lab/test results, encounter notes, upcoming appointments, etc.  Non-urgent messages can be sent to your provider as well.   To learn more about what you can do with MyChart, go to [email or web address].    Your next appointment:   6 month(s)  The format for your next appointment:   In Person  Provider:   Kate Sable, MD    Other Instructions   Important Information About Sugar

## 2022-03-03 NOTE — Progress Notes (Deleted)
Established patient visit   Patient: Kristin Mora   DOB: Sep 14, 1955   66 y.o. Female  MRN: [id number] Visit Date: 03/04/2022  Today's healthcare provider: [name], PA-C   No chief complaint on file.  Subjective    HPI  Diabetes Mellitus Type II, Follow-up  Lab Results  Component Value Date   HGBA1C 6.1 (A) 08/25/2021   HGBA1C 6.9 (H) 07/27/2020   HGBA1C 6.7 (A) 04/10/2020   Wt Readings from Last 3 Encounters:  02/25/22 271 lb (122.9 kg)  01/24/22 277 lb 9.6 oz (125.9 kg)  01/19/22 280 lb (127 kg)   Last seen for diabetes {1-12:18279} {days/wks/mos/yrs:310907} ago.  Management since then includes ***. She reports {excellent/good/fair/poor:19665} compliance with treatment. She {is/is not:21021397} having side effects. {document side effects if present:1} Symptoms: {Yes/No:20286} fatigue {Yes/No:20286} foot ulcerations  {Yes/No:20286} appetite changes {Yes/No:20286} nausea  {Yes/No:20286} paresthesia of the feet  {Yes/No:20286} polydipsia  {Yes/No:20286} polyuria {Yes/No:20286} visual disturbances   {Yes/No:20286} vomiting     Home blood sugar records: {diabetes glucometry results:16657}  Episodes of hypoglycemia? {Yes/No:20286} {enter symptoms and frequency of symptoms if yes:1}   Current insulin regiment: {enter 'none' or type of insulin and number of units taken with each dose of each insulin formulation that the patient is taking:1} Most Recent Eye Exam: *** {Current exercise:16438:::1} {Current diet habits:16563:::1}  Pertinent Labs: Lab Results  Component Value Date   CHOL 167 09/01/2021   HDL 34 (L) 09/01/2021   LDLCALC 110 (H) 09/01/2021   TRIG 124 09/01/2021   CHOLHDL 4.9 (H) 09/01/2021   Lab Results  Component Value Date   NA 143 09/01/2021   K 4.0 09/01/2021   CREATININE 0.73 09/01/2021   EGFR 90 09/01/2021   LABMICR 20.4 09/01/2021      ---------------------------------------------------------------------------------------------------  Medications: Outpatient Medications Prior to Visit  Medication Sig   amLODipine (NORVASC) 10 MG tablet Take 1 tablet by mouth once daily   atorvastatin (LIPITOR) 80 MG tablet Take 1 tablet by mouth once daily   Blood Pressure Monitoring (BLOOD PRESSURE DIGITAL SOLN) KIT Dispense one automatic/digital blood pressure machine; check blood pressure twice daily in AM and PM   hydrocortisone 1 % lotion Apply 1 Application topically 2 (two) times daily. For itching   ibuprofen (ADVIL) 600 MG tablet Take 1 tablet (600 mg total) by mouth every 8 (eight) hours as needed for moderate pain.   ketoconazole (NIZORAL) 2 % cream Apply 1 Application topically daily. On R armpit, under R breast   lidocaine (LIDODERM) 5 % Place 1 patch onto the skin daily. Remove & Discard patch within 12 hours or as directed by MD   losartan-hydrochlorothiazide (HYZAAR) 100-25 MG tablet TAKE 1 TABLET BY MOUTH ONCE DAILY.   metFORMIN (GLUCOPHAGE) 500 MG tablet TAKE 1 TABLET BY MOUTH TWICE DAILY WITH A MEAL.   Miconazole-Zinc Oxide-Petrolat 0.25-15-81.35 % OINT Apply to affected skin twice a day until clear   mupirocin ointment (BACTROBAN) 2 % Apply 1 Application topically 3 (three) times daily. Apply to right lower lip   oxybutynin (DITROPAN) 5 MG tablet Take 1 tablet (5 mg total) by mouth 2 (two) times daily.   Semaglutide,0.25 or 0.5MG/DOS, 2 MG/3ML SOPN Inject 0.25 mg into the skin once a week. For 4 weeks, then increase to 0.5 MG once a week for 4 weeks.   terbinafine (LAMISIL) 1 % cream Apply 1 Application topically 2 (two) times daily. To affected areas of rash under breast and under arms   valACYclovir (VALTREX)  1000 MG tablet Take 2 tablets by mouth with water, then take 2 more tablets in 12 hours.   Vitamin D, Ergocalciferol, (DRISDOL) 1.25 MG (50000 UNIT) CAPS capsule Take 1 capsule by mouth once a week   No  facility-administered medications prior to visit.    Review of Systems  {Labs  Heme  Chem  Endocrine  Serology  Results Review (optional):23779}   Objective    There were no vitals taken for this visit. {Show previous vital signs (optional):23777}  Physical Exam  ***  No results found for any visits on 03/04/22.  Assessment & Plan     ***  No follow-ups on file.      {provider attestation***:1}   [name], PA-C  [facility] [phone] (phone) [phone] (fax)  [place]

## 2022-03-04 ENCOUNTER — Ambulatory Visit: Payer: Medicare Other | Admitting: Physician Assistant

## 2022-03-21 ENCOUNTER — Telehealth: Payer: Self-pay | Admitting: Physician Assistant

## 2022-03-21 NOTE — Telephone Encounter (Signed)
Copied from [place] [id number]. Topic: Medicare AWV >> Mar 21, 2022  2:41 PM Jae Dire wrote: Reason for CRM: No answer unable to leave a message for patient to call back and schedule Medicare Annual Wellness Visit (AWV) in office.   If unable to come into the office for AWV,  please offer to do virtually or by telephone.  Last AWV: : 06/30/2020  Please schedule at anytime with [facility] Health Advisor.  30 minute appointment for Virtual or phone 45 minute appointment for in office or Initial virtual/phone  Any questions, please contact me at [phone]

## 2022-04-01 ENCOUNTER — Ambulatory Visit (INDEPENDENT_AMBULATORY_CARE_PROVIDER_SITE_OTHER): Payer: Medicare Other | Admitting: Physician Assistant

## 2022-04-01 ENCOUNTER — Encounter: Payer: Self-pay | Admitting: Physician Assistant

## 2022-04-01 VITALS — BP 168/89 | HR 74 | Wt 265.1 lb

## 2022-04-01 DIAGNOSIS — E119 Type 2 diabetes mellitus without complications: Secondary | ICD-10-CM | POA: Diagnosis not present

## 2022-04-01 DIAGNOSIS — E1159 Type 2 diabetes mellitus with other circulatory complications: Secondary | ICD-10-CM

## 2022-04-01 DIAGNOSIS — Z23 Encounter for immunization: Secondary | ICD-10-CM

## 2022-04-01 DIAGNOSIS — Z0000 Encounter for general adult medical examination without abnormal findings: Secondary | ICD-10-CM | POA: Diagnosis not present

## 2022-04-01 DIAGNOSIS — I152 Hypertension secondary to endocrine disorders: Secondary | ICD-10-CM

## 2022-04-01 DIAGNOSIS — Z780 Asymptomatic menopausal state: Secondary | ICD-10-CM

## 2022-04-01 DIAGNOSIS — R051 Acute cough: Secondary | ICD-10-CM

## 2022-04-01 DIAGNOSIS — Z13820 Encounter for screening for osteoporosis: Secondary | ICD-10-CM

## 2022-04-01 LAB — POCT GLYCOSYLATED HEMOGLOBIN (HGB A1C): Hemoglobin A1C: 6.2 % — AB (ref 4.0–5.6)

## 2022-04-01 MED ORDER — SEMAGLUTIDE (1 MG/DOSE) 4 MG/3ML ~~LOC~~ SOPN: 1.0000 mg | PEN_INJECTOR | SUBCUTANEOUS | 1 refills | Status: DC

## 2022-04-01 NOTE — Assessment & Plan Note (Addendum)
Follows with cardiology Manages with amlodipine 10 mg, losartan hctz 100-25 mg Elevated in office but well controlled last mo at cardio visit-- advised pt check again at home May be 2/2 cough medication otc Reviewed last cmp

## 2022-04-01 NOTE — Assessment & Plan Note (Addendum)
Pt manages with metformin 500 mg BID and recently added ( from cardio) ozempic now taking  a1c today 6.2% sent in the next dose of ozempic -- 1 mg, advised after 4 injections of 0.5 mg to increase to 1 mg weekly Foot exam today uacr utd optho reminded F/u 4 mo

## 2022-04-01 NOTE — Assessment & Plan Note (Signed)
Resolving, continue otc cough medicine for the next 1-2 days, fluids, rest Call office if any sob, wheezing

## 2022-04-01 NOTE — Progress Notes (Signed)
I,Sha'taria Tyson,acting as a Education administrator for [employer], PA-C.,have documented all relevant documentation on the behalf of [name], PA-C,as directed by  [name], PA-C while in the presence of [name], PA-C.   Annual Wellness Visit     Patient: Kristin Mora, Female    DOB: December 16, 1955, 65 y.o.   MRN: [id number] Visit Date: 04/01/2022  Today's Provider: [name], PA-C   Cc. awv  Subjective    Kristin Mora is a 66 y.o. female who presents today for her Annual Wellness Visit. She reports consuming a general diet. The patient does not participate in regular exercise at present. She generally feels well. She reports sleeping well. She does not have additional problems to discuss today.   HPI  Pt reports a cough for the last week or so, denies sob, wheezing. Sometimes productive. She is taking coricidin cough otc. Denies  fevers, chills.   Medications: Outpatient Medications Prior to Visit  Medication Sig   amLODipine (NORVASC) 10 MG tablet Take 1 tablet by mouth once daily   atorvastatin (LIPITOR) 80 MG tablet Take 1 tablet by mouth once daily   Blood Pressure Monitoring (BLOOD PRESSURE DIGITAL SOLN) KIT Dispense one automatic/digital blood pressure machine; check blood pressure twice daily in AM and PM   hydrocortisone 1 % lotion Apply 1 Application topically 2 (two) times daily. For itching   ibuprofen (ADVIL) 600 MG tablet Take 1 tablet (600 mg total) by mouth every 8 (eight) hours as needed for moderate pain.   ketoconazole (NIZORAL) 2 % cream Apply 1 Application topically daily. On R armpit, under R breast   lidocaine (LIDODERM) 5 % Place 1 patch onto the skin daily. Remove & Discard patch within 12 hours or as directed by MD   losartan-hydrochlorothiazide (HYZAAR) 100-25 MG tablet TAKE 1 TABLET BY MOUTH ONCE DAILY.   metFORMIN (GLUCOPHAGE) 500 MG tablet TAKE 1 TABLET BY MOUTH TWICE DAILY WITH A MEAL.   Miconazole-Zinc Oxide-Petrolat 0.25-15-81.35  % OINT Apply to affected skin twice a day until clear   mupirocin ointment (BACTROBAN) 2 % Apply 1 Application topically 3 (three) times daily. Apply to right lower lip   oxybutynin (DITROPAN) 5 MG tablet Take 1 tablet (5 mg total) by mouth 2 (two) times daily.   terbinafine (LAMISIL) 1 % cream Apply 1 Application topically 2 (two) times daily. To affected areas of rash under breast and under arms   valACYclovir (VALTREX) 1000 MG tablet Take 2 tablets by mouth with water, then take 2 more tablets in 12 hours.   Vitamin D, Ergocalciferol, (DRISDOL) 1.25 MG (50000 UNIT) CAPS capsule Take 1 capsule by mouth once a week   [DISCONTINUED] Semaglutide,0.25 or 0.5MG/DOS, 2 MG/3ML SOPN Inject 0.25 mg into the skin once a week. For 4 weeks, then increase to 0.5 MG once a week for 4 weeks.   No facility-administered medications prior to visit.    Allergies  Allergen Reactions   Lisinopril Swelling   Latex Rash    Patient Care Team: [name], PA-C as PCP - General (Physician Assistant) Kate Sable, MD as PCP - Cardiology (Cardiology) Idelle Leech, OD (Optometry)  Review of Systems  Constitutional:  Negative for fatigue and fever.  Respiratory:  Positive for cough. Negative for shortness of breath.   Cardiovascular:  Negative for chest pain and leg swelling.  Gastrointestinal:  Negative for abdominal pain.  Neurological:  Negative for dizziness and headaches.       Objective  Blood pressure (!) 168/89, pulse 74, weight 265 lb 1.6 oz (120.2 kg), SpO2 99 %.    Physical Exam Constitutional:      General: She is awake.     Appearance: She is well-developed.  HENT:     Head: Normocephalic.  Eyes:     Conjunctiva/sclera: Conjunctivae normal.  Cardiovascular:     Rate and Rhythm: Normal rate and regular rhythm.     Pulses:          Dorsalis pedis pulses are 3+ on the right side and 3+ on the left side.       Posterior tibial pulses are 3+ on the right side and 3+ on the  left side.     Heart sounds: Normal heart sounds.  Pulmonary:     Effort: Pulmonary effort is normal.     Breath sounds: Normal breath sounds.  Musculoskeletal:     Right lower leg: No edema.     Left lower leg: No edema.  Feet:     Right foot:     Protective Sensation: 3 sites tested.  3 sites sensed.     Skin integrity: Skin integrity normal.     Toenail Condition: Right toenails are normal.     Left foot:     Protective Sensation: 3 sites tested.  3 sites sensed.     Skin integrity: Skin integrity normal.     Toenail Condition: Left toenails are normal.  Skin:    General: Skin is warm.  Neurological:     Mental Status: She is alert and oriented to person, place, and time.  Psychiatric:        Attention and Perception: Attention normal.        Mood and Affect: Mood normal.        Speech: Speech normal.        Behavior: Behavior is cooperative.    Most recent functional status assessment:    08/25/2021    2:46 PM  In your present state of health, do you have any difficulty performing the following activities:  Hearing? 0  Vision? 0  Difficulty concentrating or making decisions? 0  Walking or climbing stairs? 1  Dressing or bathing? 1  Doing errands, shopping? 0   Most recent fall risk assessment:    04/01/2022    4:03 PM  [place] in the past year? 0  Number falls in past yr: 0  Injury with Fall? 0  Risk for fall due to : No Fall Risks  Follow up Falls evaluation completed    Most recent depression screenings:    04/01/2022    4:03 PM 08/25/2021    2:46 PM  PHQ 2/9 Scores  PHQ - 2 Score 0 0  PHQ- 9 Score 6 3   Most recent cognitive screening:     No data to display         Most recent Audit-C alcohol use screening    04/01/2022    4:03 PM  Alcohol Use Disorder Test (AUDIT)  1. How often do you have a drink containing alcohol? 0  2. How many drinks containing alcohol do you have on a typical day when you are drinking? 0  3. How often do you  have six or more drinks on one occasion? 0  AUDIT-C Score 0   A score of 3 or more in women, and 4 or more in men indicates increased risk for alcohol abuse, EXCEPT if all of the points  are from question 1   Results for orders placed or performed in visit on 04/01/22  POCT HgB A1C  Result Value Ref Range   Hemoglobin A1C 6.2 (A) 4.0 - 5.6 %   HbA1c POC (<> result, manual entry)     HbA1c, POC (prediabetic range)     HbA1c, POC (controlled diabetic range)      Assessment & Plan     Annual wellness visit done today including the all of the following: Reviewed patient's Family Medical History Reviewed and updated list of patient's medical providers Assessed patient's functional ability Established a written schedule for health screening [place] Completed and Reviewed  Exercise Activities and Dietary recommendations  Goals      Exercise 3x per week (30 min per time)     Recommend to exercise for 3 days a week for at least 30 minutes at a time.         Immunization History  Administered Date(s) Administered   Fluad Quad(high Dose 65+) 03/27/2019, 04/10/2020, 04/27/2021, 04/01/2022   Influenza,inj,Quad PF,6+ Mos 05/26/2015   Pneumococcal Conjugate-13 07/27/2020   Pneumococcal Polysaccharide-23 04/18/2012, 06/25/2019   Tdap 12/27/2010    Health Maintenance  Topic Date Due   Zoster Vaccines- Shingrix (1 of 2) Never done   OPHTHALMOLOGY EXAM  09/13/2012   DEXA SCAN  Never done   TETANUS/TDAP  12/30/2022 (Originally 12/26/2020)   Diabetic kidney evaluation - GFR measurement  09/02/2022   Diabetic kidney evaluation - Urine ACR  09/02/2022   HEMOGLOBIN A1C  10/01/2022   FOOT EXAM  04/02/2023   MAMMOGRAM  01/22/2024   COLONOSCOPY (Pts 45-26yr Insurance coverage will need to be confirmed)  01/19/2025   Pneumonia Vaccine 66 Years old  Completed   INFLUENZA VACCINE  Completed   Hepatitis C Screening  Completed   HPV VACCINES  Aged Out   COVID-19  Vaccine  Discontinued     Discussed health benefits of physical activity, and encouraged her to engage in regular exercise appropriate for her age and condition.    Problem List Items Addressed This Visit       Cardiovascular and Mediastinum   Hypertension associated with diabetes ([place]    Follows with cardiology Manages with amlodipine 10 mg, losartan hctz 100-25 mg Elevated in office but well controlled last mo at cardio visit-- advised pt check again at home May be 2/2 cough medication otc Reviewed last cmp      Relevant Medications   Semaglutide, 1 MG/DOSE, 4 MG/3ML SOPN     Endocrine   Diabetes mellitus, type 2 ([place]    Pt manages with metformin 500 mg BID and recently added ( from cardio) ozempic now taking  a1c today 6.2% sent in the next dose of ozempic -- 1 mg, advised after 4 injections of 0.5 mg to increase to 1 mg weekly Foot exam today uacr utd optho reminded F/u 4 mo       Relevant Medications   Semaglutide, 1 MG/DOSE, 4 MG/3ML SOPN   Other Relevant Orders   POCT HgB A1C (Completed)     Other   Acute cough    Resolving, continue otc cough medicine for the next 1-2 days, fluids, rest Call office if any sob, wheezing      Other Visit Diagnoses     Encounter for annual wellness exam in Medicare patient    -  Primary   Encounter for osteoporosis screening in asymptomatic postmenopausal patient       Relevant Orders  DG Bone Density   Need for immunization against influenza       Relevant Orders   Flu Vaccine QUAD High Dose(Fluad) (Completed)        Return in about 4 months (around 08/02/2022) for chronic conditions.     I, [name], PA-C have reviewed all documentation for this visit. The documentation on  04/01/2022  for the exam, diagnosis, procedures, and orders are all accurate and complete.  [name], PA-C [facility] [address] [place], [place], [address] Office: [phone] Fax: [place]

## 2022-04-26 ENCOUNTER — Ambulatory Visit: Payer: Medicare Other | Admitting: Primary Care

## 2022-04-27 ENCOUNTER — Other Ambulatory Visit: Payer: Self-pay | Admitting: Physician Assistant

## 2022-05-31 ENCOUNTER — Other Ambulatory Visit: Payer: Self-pay | Admitting: Physician Assistant

## 2022-05-31 ENCOUNTER — Ambulatory Visit: Payer: Medicare Other

## 2022-05-31 DIAGNOSIS — E119 Type 2 diabetes mellitus without complications: Secondary | ICD-10-CM

## 2022-05-31 NOTE — Telephone Encounter (Signed)
Requested Prescriptions  Pending Prescriptions Disp Refills   OZEMPIC, 1 MG/DOSE, 4 MG/3ML SOPN [Pharmacy Med Name: Ozempic (1 MG/DOSE) 4 MG/3ML Subcutaneous Solution Pen-injector] 3 mL 0    Sig: INJECT '1MG'$  INTO THE SKIN ONCE PER WEEK. START AFTER 4 DOSES OF 0.'5MG'$      Endocrinology:  Diabetes - GLP-1 Receptor Agonists - semaglutide Failed - 05/31/2022  3:07 PM      Failed - HBA1C in normal range and within 180 days    Hemoglobin A1C  Date Value Ref Range Status  04/01/2022 6.2 (A) 4.0 - 5.6 % Final  06/29/2016 6.5  Final   Hgb A1c MFr Bld  Date Value Ref Range Status  07/27/2020 6.9 (H) 4.8 - 5.6 % Final    Comment:             Prediabetes: 5.7 - 6.4          Diabetes: >6.4          Glycemic control for adults with diabetes: <7.0          Passed - Cr in normal range and within 360 days    Creatinine  Date Value Ref Range Status  10/01/2014 0.81 mg/dL Final    Comment:    0.44-1.00 NOTE: New Reference Range  09/02/14    Creatinine, Ser  Date Value Ref Range Status  09/01/2021 0.73 0.57 - 1.00 mg/dL Final         Passed - Valid encounter within last 6 months    Recent Outpatient Visits           2 months ago Encounter for annual wellness exam in Medicare patient   [facility] [place], [place], PA-C   5 months ago [place] Thedore Mins, [place], PA-C   6 months ago Hypertension associated with diabetes [facility])   [name], [facility]   9 months ago Type 2 diabetes mellitus without complication, without long-term current use of insulin [facility])   [facility] Thedore Mins, [place], PA-C   1 year ago Type 2 diabetes mellitus without complication, without long-term current use of insulin [facility])   [facility] Jerrol Banana., MD       Future Appointments             In 2 months Thedore Mins, Ria Comment, PA-C [facility], [place]   In 3 months Kate Sable, MD [place]. [place]

## 2022-06-15 ENCOUNTER — Other Ambulatory Visit: Payer: Medicare Other

## 2022-06-16 ENCOUNTER — Telehealth: Payer: Self-pay | Admitting: Physician Assistant

## 2022-06-16 NOTE — Telephone Encounter (Signed)
Patient received a letter from [facility] advising her to contact her PCP and request a PA for OZEMPIC, 2 MG/DOSE, 4 MG/3ML SOPN. Patient states she does not take '2MG'$  but '1MG'$ . Patient states the letter reflects if a PA is not initiated medication will no longer be covered effective 06/26/2022. Patient would like a follow up call regarding the status

## 2022-06-17 NOTE — Telephone Encounter (Signed)
Do you know if a PA was started?

## 2022-06-22 ENCOUNTER — Ambulatory Visit: Payer: Medicare Other

## 2022-06-23 ENCOUNTER — Telehealth: Payer: Self-pay

## 2022-06-23 ENCOUNTER — Other Ambulatory Visit: Payer: Self-pay | Admitting: Physician Assistant

## 2022-06-23 ENCOUNTER — Other Ambulatory Visit: Payer: Self-pay | Admitting: Family Medicine

## 2022-06-23 DIAGNOSIS — E119 Type 2 diabetes mellitus without complications: Secondary | ICD-10-CM

## 2022-06-23 MED ORDER — OZEMPIC (2 MG/DOSE) 8 MG/3ML ~~LOC~~ SOPN: 2.0000 mg | PEN_INJECTOR | SUBCUTANEOUS | 0 refills | Status: DC

## 2022-06-23 NOTE — Telephone Encounter (Signed)
Copied from [place] [id number]. Topic: General - Other >> Jun 23, 2022  2:40 PM Everette C wrote: Reason for CRM: The patient has called to request completion of a prior authorization for their prescription for Semaglutide, 2 MG/DOSE, (OZEMPIC, 2 MG/DOSE,) 8 MG/3ML SOPN [[id number]]  Please contact the patient further when possible to discuss completion of the prior authorization

## 2022-06-24 NOTE — Telephone Encounter (Signed)
PA completed.

## 2022-06-28 ENCOUNTER — Other Ambulatory Visit: Payer: Self-pay | Admitting: Dermatology

## 2022-06-28 DIAGNOSIS — L089 Local infection of the skin and subcutaneous tissue, unspecified: Secondary | ICD-10-CM

## 2022-06-29 NOTE — Telephone Encounter (Signed)
Pharmacy states patient has already picked up

## 2022-06-29 NOTE — Telephone Encounter (Signed)
PA approved.

## 2022-07-08 ENCOUNTER — Encounter: Payer: Self-pay | Admitting: Physician Assistant

## 2022-07-08 ENCOUNTER — Ambulatory Visit (INDEPENDENT_AMBULATORY_CARE_PROVIDER_SITE_OTHER): Payer: 59 | Admitting: Physician Assistant

## 2022-07-08 VITALS — BP 154/83 | HR 71 | Temp 98.0°F | Resp 16 | Wt 242.0 lb

## 2022-07-08 DIAGNOSIS — E1169 Type 2 diabetes mellitus with other specified complication: Secondary | ICD-10-CM

## 2022-07-08 DIAGNOSIS — E119 Type 2 diabetes mellitus without complications: Secondary | ICD-10-CM

## 2022-07-08 DIAGNOSIS — I152 Hypertension secondary to endocrine disorders: Secondary | ICD-10-CM

## 2022-07-08 DIAGNOSIS — E1159 Type 2 diabetes mellitus with other circulatory complications: Secondary | ICD-10-CM

## 2022-07-08 DIAGNOSIS — E7800 Pure hypercholesterolemia, unspecified: Secondary | ICD-10-CM | POA: Diagnosis not present

## 2022-07-08 DIAGNOSIS — E559 Vitamin D deficiency, unspecified: Secondary | ICD-10-CM

## 2022-07-08 DIAGNOSIS — E785 Hyperlipidemia, unspecified: Secondary | ICD-10-CM

## 2022-07-08 NOTE — Assessment & Plan Note (Signed)
Manages with metformin 500 mg bid and ozempic 1 mg weekly Pt's weight 10/23 265 lbs was today 242 lbs Last A1c 6.2 % ordered repeat today Foot exam utd Uacr utd Needs optho appt On statin on ace/arb F/u 6 mo pending A1c

## 2022-07-08 NOTE — Assessment & Plan Note (Signed)
Managed with atorvastatin 80 mg LDL goal < 70 Will repeat fasting lipids/cmp

## 2022-07-08 NOTE — Assessment & Plan Note (Signed)
Managed with amlodipine 10 mg, losartan hctz 100-25, follows with cardiology Elevated in office today, encouraged pt to keep cardio appt and check at home  F/u 6 mo or earlier if noticing elevated numbers at home

## 2022-07-08 NOTE — Progress Notes (Signed)
I,Joseline E Rosas,acting as a scribe for [employer], PA-C.,have documented all relevant documentation on the behalf of [name], PA-C,as directed by  [name], PA-C while in the presence of [name], PA-C.   Established patient visit   Patient: Kristin Mora   DOB: 15-Aug-1955   67 y.o. Female  MRN: [id number] Visit Date: 07/08/2022  Today's healthcare provider: [name], PA-C   Chief Complaint  Patient presents with   Follow-Up chronic Disease   Subjective    HPI  Diabetes Mellitus Type II, follow-up  Lab Results  Component Value Date   HGBA1C 6.2 (A) 04/01/2022   HGBA1C 6.1 (A) 08/25/2021   HGBA1C 6.9 (H) 07/27/2020   Last seen for diabetes 3 months ago.  Management since then includes continuing the same treatment. She reports excellent compliance with treatment. She is not having side effects.  Home blood sugar records:  not being checked  Most Recent Eye Exam: not UTD  --------------------------------------------------------------------------------------------------- Hypertension, follow-up  BP Readings from Last 3 Encounters:  07/08/22 (!) 154/83  04/01/22 (!) 168/89  02/25/22 120/60   Wt Readings from Last 3 Encounters:  07/08/22 242 lb (109.8 kg)  04/01/22 265 lb 1.6 oz (120.2 kg)  02/25/22 271 lb (122.9 kg)     She was last seen for hypertension 2-3 months ago.  BP at that visit was 168/89. Management since that visit includes none. She reports excellent compliance with treatment.  Outside blood pressures are not being checked.BP cuff at home is broke  She does not smoke.  Medications: Outpatient Medications Prior to Visit  Medication Sig   amLODipine (NORVASC) 10 MG tablet Take 1 tablet by mouth once daily   atorvastatin (LIPITOR) 80 MG tablet Take 1 tablet by mouth once daily   Blood Pressure Monitoring (BLOOD PRESSURE DIGITAL SOLN) KIT Dispense one automatic/digital blood pressure machine; check blood pressure  twice daily in AM and PM   hydrocortisone 1 % lotion Apply 1 Application topically 2 (two) times daily. For itching   ketoconazole (NIZORAL) 2 % cream Apply 1 Application topically daily. On R armpit, under R breast   losartan-hydrochlorothiazide (HYZAAR) 100-25 MG tablet TAKE 1 TABLET BY MOUTH ONCE DAILY.   metFORMIN (GLUCOPHAGE) 500 MG tablet TAKE 1 TABLET BY MOUTH TWICE DAILY WITH A MEAL.   Miconazole-Zinc Oxide-Petrolat 0.25-15-81.35 % OINT Apply to affected skin twice a day until clear   oxybutynin (DITROPAN) 5 MG tablet Take 1 tablet (5 mg total) by mouth 2 (two) times daily.   Semaglutide, 2 MG/DOSE, (OZEMPIC, 2 MG/DOSE,) 8 MG/3ML SOPN Inject 2 mg into the skin once a week.   terbinafine (LAMISIL) 1 % cream Apply 1 Application topically 2 (two) times daily. To affected areas of rash under breast and under arms   valACYclovir (VALTREX) 1000 MG tablet Take 2 tablets by mouth with water, then take 2 more tablets in 12 hours.   Vitamin D, Ergocalciferol, (DRISDOL) 1.25 MG (50000 UNIT) CAPS capsule Take 1 capsule by mouth once a week   [DISCONTINUED] ibuprofen (ADVIL) 600 MG tablet Take 1 tablet (600 mg total) by mouth every 8 (eight) hours as needed for moderate pain.   [DISCONTINUED] lidocaine (LIDODERM) 5 % Place 1 patch onto the skin daily. Remove & Discard patch within 12 hours or as directed by MD   [DISCONTINUED] mupirocin ointment (BACTROBAN) 2 % Apply 1 Application topically 3 (three) times daily. Apply to right lower lip   No facility-administered medications prior to visit.  Review of Systems  Constitutional:  Negative for fatigue and fever.  Respiratory:  Negative for cough and shortness of breath.   Cardiovascular:  Negative for chest pain and leg swelling.  Gastrointestinal:  Negative for abdominal pain.  Neurological:  Negative for dizziness and headaches.      Objective    BP (!) 154/83 (BP Location: Left Arm, Patient Position: Sitting, Cuff Size: Large)   Pulse 71    Temp 98 F (36.7 C) (Oral)   Resp 16   Wt 242 lb (109.8 kg)   BMI 44.26 kg/m    Physical Exam Constitutional:      General: She is awake.     Appearance: She is well-developed.  HENT:     Head: Normocephalic.  Eyes:     Conjunctiva/sclera: Conjunctivae normal.  Cardiovascular:     Rate and Rhythm: Normal rate and regular rhythm.     Heart sounds: Normal heart sounds.  Pulmonary:     Effort: Pulmonary effort is normal.     Breath sounds: Normal breath sounds.  Musculoskeletal:     Right lower leg: No edema.     Left lower leg: No edema.  Skin:    General: Skin is warm.  Neurological:     Mental Status: She is alert and oriented to person, place, and time.  Psychiatric:        Attention and Perception: Attention normal.        Mood and Affect: Mood normal.        Speech: Speech normal.        Behavior: Behavior is cooperative.      No results found for any visits on 07/08/22.  Assessment & Plan      Problem List Items Addressed This Visit       Cardiovascular and Mediastinum   Hypertension associated with diabetes ([place]) - Primary    Managed with amlodipine 10 mg, losartan hctz 100-25, follows with cardiology Elevated in office today, encouraged pt to keep cardio appt and check at home  F/u 6 mo or earlier if noticing elevated numbers at home      Relevant Orders   CBC w/Diff/Platelet   Comprehensive Metabolic Panel (CMET)     Endocrine   Hyperlipidemia associated with type 2 diabetes mellitus ([place])    Managed with atorvastatin 80 mg LDL goal < 70 Will repeat fasting lipids/cmp      Relevant Orders   Lipid Profile   Diabetes mellitus, type 2 ([place])    Manages with metformin 500 mg bid and ozempic 1 mg weekly Pt's weight 10/23 265 lbs was today 242 lbs Last A1c 6.2 % ordered repeat today Foot exam utd Uacr utd Needs optho appt On statin on ace/arb F/u 6 mo pending A1c      Relevant Orders   CBC w/Diff/Platelet   Comprehensive Metabolic Panel  (CMET)   HgB A1c     Other   Avitaminosis D   Relevant Orders   Vitamin D (25 hydroxy)      Return in about 6 months (around 01/06/2023) for chronic conditions, CPE.      I, [name], PA-C have reviewed all documentation for this visit. The documentation on  07/08/22  for the exam, diagnosis, procedures, and orders are all accurate and complete.  [name], PA-C [facility] [address] [place], [place], [address] Office: [phone] Fax: [place]

## 2022-07-09 LAB — COMPREHENSIVE METABOLIC PANEL
ALT: 34 IU/L — ABNORMAL HIGH (ref 0–32)
AST: 27 IU/L (ref 0–40)
Albumin/Globulin Ratio: 1.3 (ref 1.2–2.2)
Albumin: 4.2 g/dL (ref 3.9–4.9)
Alkaline Phosphatase: 66 IU/L (ref 44–121)
BUN/Creatinine Ratio: 23 (ref 12–28)
BUN: 16 mg/dL (ref 8–27)
Bilirubin Total: 0.8 mg/dL (ref 0.0–1.2)
CO2: 27 mmol/L (ref 20–29)
Calcium: 10 mg/dL (ref 8.7–10.3)
Chloride: 101 mmol/L (ref 96–106)
Creatinine, Ser: 0.71 mg/dL (ref 0.57–1.00)
Globulin, Total: 3.2 g/dL (ref 1.5–4.5)
Glucose: 86 mg/dL (ref 70–99)
Potassium: 4.3 mmol/L (ref 3.5–5.2)
Sodium: 144 mmol/L (ref 134–144)
Total Protein: 7.4 g/dL (ref 6.0–8.5)
eGFR: 93 mL/min/{1.73_m2} (ref >59)

## 2022-07-09 LAB — CBC WITH DIFFERENTIAL/PLATELET
Basophils Absolute: 0.1 10*3/uL (ref 0.0–0.2)
Basos: 1 %
EOS (ABSOLUTE): 0.4 10*3/uL (ref 0.0–0.4)
Eos: 4 %
Hematocrit: 43.3 % (ref 34.0–46.6)
Hemoglobin: 14.3 g/dL (ref 11.1–15.9)
Immature Grans (Abs): 0 10*3/uL (ref 0.0–0.1)
Immature Granulocytes: 0 %
Lymphocytes Absolute: 2.8 10*3/uL (ref 0.7–3.1)
Lymphs: 26 %
MCH: 29 pg (ref 26.6–33.0)
MCHC: 33 g/dL (ref 31.5–35.7)
MCV: 88 fL (ref 79–97)
Monocytes Absolute: 0.8 10*3/uL (ref 0.1–0.9)
Monocytes: 7 %
Neutrophils Absolute: 6.8 10*3/uL (ref 1.4–7.0)
Neutrophils: 62 %
Platelets: 278 10*3/uL (ref 150–450)
RBC: 4.93 x10E6/uL (ref 3.77–5.28)
RDW: 15.7 % — ABNORMAL HIGH (ref 11.7–15.4)
WBC: 10.8 10*3/uL (ref 3.4–10.8)

## 2022-07-09 LAB — LIPID PANEL
Chol/HDL Ratio: 3.3 ratio (ref 0.0–4.4)
Cholesterol, Total: 117 mg/dL (ref 100–199)
HDL: 36 mg/dL — ABNORMAL LOW (ref >39)
LDL Chol Calc (NIH): 67 mg/dL (ref 0–99)
Triglycerides: 67 mg/dL (ref 0–149)
VLDL Cholesterol Cal: 14 mg/dL (ref 5–40)

## 2022-07-09 LAB — VITAMIN D 25 HYDROXY (VIT D DEFICIENCY, FRACTURES): Vit D, 25-Hydroxy: 24.1 ng/mL — ABNORMAL LOW (ref 30.0–100.0)

## 2022-07-09 LAB — HEMOGLOBIN A1C
Est. average glucose Bld gHb Est-mCnc: 128 mg/dL
Hgb A1c MFr Bld: 6.1 % — ABNORMAL HIGH (ref 4.8–5.6)

## 2022-07-11 ENCOUNTER — Other Ambulatory Visit: Payer: Self-pay | Admitting: Physician Assistant

## 2022-07-11 DIAGNOSIS — E559 Vitamin D deficiency, unspecified: Secondary | ICD-10-CM

## 2022-07-26 ENCOUNTER — Telehealth: Payer: Self-pay | Admitting: Physician Assistant

## 2022-07-26 ENCOUNTER — Other Ambulatory Visit: Payer: Self-pay | Admitting: Physician Assistant

## 2022-07-26 DIAGNOSIS — I152 Hypertension secondary to endocrine disorders: Secondary | ICD-10-CM

## 2022-07-26 DIAGNOSIS — E119 Type 2 diabetes mellitus without complications: Secondary | ICD-10-CM

## 2022-07-26 NOTE — Telephone Encounter (Signed)
Both medications already requested by the pharmacy today, awaiting approval.

## 2022-07-26 NOTE — Telephone Encounter (Signed)
Copied from [place] [id number]. Topic: General - Other >> Jul 26, 2022 11:34 AM Everette C wrote: Reason for CRM: Medication Refill - Medication: losartan-hydrochlorothiazide (HYZAAR) 100-25 MG tablet [[id number]]  metFORMIN (GLUCOPHAGE) 500 MG tablet [[id number]]  Has the patient contacted their pharmacy? Yes.   (Agent: If no, request that the patient contact the pharmacy for the refill. If patient does not wish to contact the pharmacy document the reason why and proceed with request.) (Agent: If yes, when and what did the pharmacy advise?)  Preferred Pharmacy (with phone number or street name): [place] ([place] [place] [address] [place] ([place]) [place] [address] Phone: [phone] Fax: [phone] Hours: Not open 24 hours   Has the patient been seen for an appointment in the last year OR does the patient have an upcoming appointment? Yes.    Agent: Please be advised that RX refills may take up to 3 business days. We ask that you follow-up with your pharmacy.

## 2022-08-02 ENCOUNTER — Ambulatory Visit: Payer: Medicare Other | Admitting: Physician Assistant

## 2022-08-09 ENCOUNTER — Encounter: Payer: Self-pay | Admitting: Physician Assistant

## 2022-08-17 ENCOUNTER — Ambulatory Visit (INDEPENDENT_AMBULATORY_CARE_PROVIDER_SITE_OTHER): Payer: 59 | Admitting: Physician Assistant

## 2022-08-17 ENCOUNTER — Encounter: Payer: Self-pay | Admitting: Physician Assistant

## 2022-08-17 VITALS — BP 147/84 | HR 85 | Temp 97.6°F | Ht 62.0 in | Wt 237.0 lb

## 2022-08-17 DIAGNOSIS — R21 Rash and other nonspecific skin eruption: Secondary | ICD-10-CM | POA: Diagnosis not present

## 2022-08-17 MED ORDER — TRIAMCINOLONE ACETONIDE 0.025 % EX OINT: 1.0000 | TOPICAL_OINTMENT | Freq: Two times a day (BID) | CUTANEOUS | 0 refills | Status: DC

## 2022-08-17 NOTE — Progress Notes (Signed)
Established patient visit   Patient: Kristin Mora   DOB: Apr 23, 1956   67 y.o. Female  MRN: [id number] Visit Date: 08/17/2022  Today's healthcare provider: [name], PA-C   Cc. Blisters on hands  Subjective    HPI  Pt reports blisters on the palms of her hands that have popped x 2 days. Denies pain ,redness, itching. Denies similar lesions on feet or in mouth. Denies fever.   Medications: Outpatient Medications Prior to Visit  Medication Sig   amLODipine (NORVASC) 10 MG tablet Take 1 tablet by mouth once daily   atorvastatin (LIPITOR) 80 MG tablet Take 1 tablet by mouth once daily   Blood Pressure Monitoring (BLOOD PRESSURE DIGITAL SOLN) KIT Dispense one automatic/digital blood pressure machine; check blood pressure twice daily in AM and PM   hydrocortisone 1 % lotion Apply 1 Application topically 2 (two) times daily. For itching   ketoconazole (NIZORAL) 2 % cream Apply 1 Application topically daily. On R armpit, under R breast   losartan-hydrochlorothiazide (HYZAAR) 100-25 MG tablet TAKE 1 TABLET BY MOUTH ONCE DAILY . APPOINTMENT REQUIRED FOR FUTURE REFILLS   metFORMIN (GLUCOPHAGE) 500 MG tablet TAKE 1 TABLET BY MOUTH TWICE DAILY WITH A MEAL   Miconazole-Zinc Oxide-Petrolat 0.25-15-81.35 % OINT Apply to affected skin twice a day until clear   oxybutynin (DITROPAN) 5 MG tablet Take 1 tablet (5 mg total) by mouth 2 (two) times daily.   Semaglutide, 2 MG/DOSE, (OZEMPIC, 2 MG/DOSE,) 8 MG/3ML SOPN Inject 2 mg into the skin once a week.   terbinafine (LAMISIL) 1 % cream Apply 1 Application topically 2 (two) times daily. To affected areas of rash under breast and under arms   Vitamin D, Ergocalciferol, (DRISDOL) 1.25 MG (50000 UNIT) CAPS capsule Take 1 capsule by mouth once a week   [DISCONTINUED] valACYclovir (VALTREX) 1000 MG tablet Take 2 tablets by mouth with water, then take 2 more tablets in 12 hours.   No facility-administered medications prior to visit.    Review  of Systems  Constitutional:  Negative for fatigue and fever.  Respiratory:  Negative for cough and shortness of breath.   Cardiovascular:  Negative for chest pain and leg swelling.  Gastrointestinal:  Negative for abdominal pain.  Skin:  Positive for rash.  Neurological:  Negative for dizziness and headaches.      Objective    BP (!) 147/84 (BP Location: Right Arm, Patient Position: Sitting, Cuff Size: Large)   Pulse 85   Temp 97.6 F (36.4 C)   Ht 5' 2"$  (1.575 m)   Wt 237 lb (107.5 kg)   SpO2 97%   BMI 43.35 kg/m    Physical Exam Vitals reviewed.  Constitutional:      Appearance: She is not ill-appearing.  HENT:     Head: Normocephalic.     Mouth/Throat:     Comments: No apparent blisters Eyes:     Conjunctiva/sclera: Conjunctivae normal.  Cardiovascular:     Rate and Rhythm: Normal rate.  Pulmonary:     Effort: Pulmonary effort is normal. No respiratory distress.  Skin:    Comments: What appears as popped blisters- circular patches of rough skin on b/l palms. Some hyperpigmentation associated. No erythema. nontender  Neurological:     General: No focal deficit present.     Mental Status: She is alert and oriented to person, place, and time.  Psychiatric:        Mood and Affect: Mood normal.  Behavior: Behavior normal.      No results found for any visits on 08/17/22.  Assessment & Plan     Blisters on hands Unclear etiology Rx triamcinalone advised pt to monitor for recurrence  Return if symptoms worsen or fail to improve.      I, [name], PA-C have reviewed all documentation for this visit. The documentation on  08/17/22  for the exam, diagnosis, procedures, and orders are all accurate and complete.  [name], PA-C [facility] [address] [place], [place], [address] Office: [phone] Fax: [place]

## 2022-08-23 ENCOUNTER — Other Ambulatory Visit: Payer: Medicare Other

## 2022-09-02 ENCOUNTER — Encounter: Payer: Self-pay | Admitting: Cardiology

## 2022-09-02 ENCOUNTER — Ambulatory Visit: Payer: 59 | Attending: Cardiology | Admitting: Cardiology

## 2022-09-24 ENCOUNTER — Other Ambulatory Visit: Payer: Self-pay | Admitting: Family Medicine

## 2022-10-06 ENCOUNTER — Other Ambulatory Visit: Payer: Self-pay

## 2022-10-06 NOTE — Progress Notes (Deleted)
Established patient visit   Patient: Kristin Mora   DOB: 1956/05/08   67 y.o. Female  MRN: [id number] Visit Date: 10/07/2022  Today's healthcare provider: Alfredia Ferguson, PA-C   No chief complaint on file.  Subjective    HPI  Diabetes Mellitus Type II, Follow-up  Lab Results  Component Value Date   HGBA1C 6.1 (H) 07/08/2022   HGBA1C 6.2 (A) 04/01/2022   HGBA1C 6.1 (A) 08/25/2021   Wt Readings from Last 3 Encounters:  08/17/22 237 lb (107.5 kg)  07/08/22 242 lb (109.8 kg)  04/01/22 265 lb 1.6 oz (120.2 kg)   Last seen for diabetes 4 months ago.  Management since then includes continue metformin 500 mg bid and ozempic 1 mg weekly . She reports {excellent/good/fair/poor:19665} compliance with treatment. She {is/is not:21021397} having side effects. {document side effects if present:1} Symptoms: {Yes/No:20286} fatigue {Yes/No:20286} foot ulcerations  {Yes/No:20286} appetite changes {Yes/No:20286} nausea  {Yes/No:20286} paresthesia of the feet  {Yes/No:20286} polydipsia  {Yes/No:20286} polyuria {Yes/No:20286} visual disturbances   {Yes/No:20286} vomiting     Home blood sugar records: {diabetes glucometry results:16657}  Episodes of hypoglycemia? {Yes/No:20286} {enter symptoms and frequency of symptoms if yes:1}   Current insulin regiment: {enter 'none' or type of insulin and number of units taken with each dose of each insulin formulation that the patient is taking:1} Most Recent Eye Exam: *** {Current exercise:16438:::1} {Current diet habits:16563:::1}  Pertinent Labs: Lab Results  Component Value Date   CHOL 117 07/08/2022   HDL 36 (L) 07/08/2022   LDLCALC 67 07/08/2022   TRIG 67 07/08/2022   CHOLHDL 3.3 07/08/2022   Lab Results  Component Value Date   NA 144 07/08/2022   K 4.3 07/08/2022   CREATININE 0.71 07/08/2022   EGFR 93 07/08/2022   LABMICR 20.4 09/01/2021   MICRALBCREAT 23 09/01/2021      ---------------------------------------------------------------------------------------------------   Medications: Outpatient Medications Prior to Visit  Medication Sig   amLODipine (NORVASC) 10 MG tablet Take 1 tablet by mouth once daily   atorvastatin (LIPITOR) 80 MG tablet Take 1 tablet by mouth once daily   Blood Pressure Monitoring (BLOOD PRESSURE DIGITAL SOLN) KIT Dispense one automatic/digital blood pressure machine; check blood pressure twice daily in AM and PM   hydrocortisone 1 % lotion Apply 1 Application topically 2 (two) times daily. For itching   ketoconazole (NIZORAL) 2 % cream Apply 1 Application topically daily. On R armpit, under R breast   losartan-hydrochlorothiazide (HYZAAR) 100-25 MG tablet TAKE 1 TABLET BY MOUTH ONCE DAILY . APPOINTMENT REQUIRED FOR FUTURE REFILLS   metFORMIN (GLUCOPHAGE) 500 MG tablet TAKE 1 TABLET BY MOUTH TWICE DAILY WITH A MEAL   Miconazole-Zinc Oxide-Petrolat 0.25-15-81.35 % OINT Apply to affected skin twice a day until clear   oxybutynin (DITROPAN) 5 MG tablet Take 1 tablet (5 mg total) by mouth 2 (two) times daily.   OZEMPIC, 2 MG/DOSE, 8 MG/3ML SOPN INJECT 2MG  SUBCUTANEOUSLY ONCE A WEEK   terbinafine (LAMISIL) 1 % cream Apply 1 Application topically 2 (two) times daily. To affected areas of rash under breast and under arms   triamcinolone (KENALOG) 0.025 % ointment Apply 1 Application topically 2 (two) times daily.   Vitamin D, Ergocalciferol, (DRISDOL) 1.25 MG (50000 UNIT) CAPS capsule Take 1 capsule by mouth once a week   No facility-administered medications prior to visit.    Review of Systems  {Labs  Heme  Chem  Endocrine  Serology  Results Review (optional):23779}   Objective    There  were no vitals taken for this visit. {Show previous vital signs (optional):23777}  Physical Exam  ***  No results found for any visits on 10/07/22.  Assessment & Plan     ***  No follow-ups on file.      {provider  attestation***:1}   Alfredia Ferguson, PA-C  [facility] [phone] (phone) [phone] (fax)  [facility]

## 2022-10-07 ENCOUNTER — Ambulatory Visit: Payer: 59 | Admitting: Physician Assistant

## 2022-10-08 ENCOUNTER — Other Ambulatory Visit: Payer: Self-pay | Admitting: Physician Assistant

## 2022-10-08 DIAGNOSIS — E1159 Type 2 diabetes mellitus with other circulatory complications: Secondary | ICD-10-CM

## 2022-10-21 ENCOUNTER — Ambulatory Visit: Payer: Self-pay

## 2022-10-21 ENCOUNTER — Encounter: Payer: Self-pay | Admitting: Family Medicine

## 2022-10-21 ENCOUNTER — Ambulatory Visit (INDEPENDENT_AMBULATORY_CARE_PROVIDER_SITE_OTHER): Payer: 59 | Admitting: Family Medicine

## 2022-10-21 VITALS — BP 174/75 | HR 72 | Temp 98.2°F | Resp 12 | Wt 229.0 lb

## 2022-10-21 DIAGNOSIS — E1159 Type 2 diabetes mellitus with other circulatory complications: Secondary | ICD-10-CM

## 2022-10-21 DIAGNOSIS — K5903 Drug induced constipation: Secondary | ICD-10-CM | POA: Insufficient documentation

## 2022-10-21 DIAGNOSIS — M545 Low back pain, unspecified: Secondary | ICD-10-CM | POA: Diagnosis not present

## 2022-10-21 DIAGNOSIS — I152 Hypertension secondary to endocrine disorders: Secondary | ICD-10-CM

## 2022-10-21 DIAGNOSIS — E559 Vitamin D deficiency, unspecified: Secondary | ICD-10-CM | POA: Insufficient documentation

## 2022-10-21 MED ORDER — VITAMIN D (ERGOCALCIFEROL) 1.25 MG (50000 UNIT) PO CAPS: 50000.0000 [IU] | ORAL_CAPSULE | ORAL | 1 refills | Status: DC

## 2022-10-21 MED ORDER — HYDRALAZINE HCL 25 MG PO TABS: 25.0000 mg | ORAL_TABLET | Freq: Three times a day (TID) | ORAL | 1 refills | Status: DC

## 2022-10-21 MED ORDER — LACTULOSE 10 GM/15ML PO SOLN: 10.0000 g | Freq: Two times a day (BID) | ORAL | 0 refills | Status: AC | PRN

## 2022-10-21 MED ORDER — TRAMADOL HCL 50 MG PO TABS: 50.0000 mg | ORAL_TABLET | Freq: Four times a day (QID) | ORAL | 0 refills | Status: AC | PRN

## 2022-10-21 NOTE — Assessment & Plan Note (Signed)
Acute, unknown cause Recommend use of bowel regimen as well as low dose tramadol to assist Continue to monitor s/s with PCP next week  Negative CVA tenderness Denies falls/injury May need PT/imaging at later date

## 2022-10-21 NOTE — Assessment & Plan Note (Signed)
Chronic, not on supplementation Restart and plan for labs with PCP

## 2022-10-21 NOTE — Assessment & Plan Note (Signed)
Chronic, remains elevated Recommend addition of hydral 25 mg tid if able to assist previous medications Goal of <119/<79 given DM

## 2022-10-21 NOTE — Telephone Encounter (Signed)
  Chief Complaint: back pain Symptoms: lower back pain, 8/10, unsure if r/t bursitis or constipation, LVM 4 days ago Frequency: 2 days  Pertinent Negatives: NA Disposition: [] ED /[] Urgent Care (no appt availability in office) / [x] Appointment(In office/virtual)/ []  [place] [facility]/ [] Home Care/ [] Refused Recommended Disposition /[] [place] [facility]/ []  Follow-up with PCP Additional Notes: pt states she has taken Miralax last week and had BM 4 days ago but hasn't taken any more since. Pt wanting to be seen, scheduled OV today at 1600 with Robynn Pane, NP  Reason for Disposition  [1] SEVERE back pain (e.g., excruciating, unable to do any normal activities) AND [2] not improved 2 hours after pain medicine  Answer Assessment - Initial Assessment Questions 1. ONSET: "When did the pain begin?"      2 days  2. LOCATION: "Where does it hurt?" (upper, mid or lower back)     Lower back  3. SEVERITY: "How bad is the pain?"  (e.g., Scale 1-10; mild, moderate, or severe)   - MILD (1-3): Doesn't interfere with normal activities.    - MODERATE (4-7): Interferes with normal activities or awakens from sleep.    - SEVERE (8-10): Excruciating pain, unable to do any normal activities.      8/10 4. PATTERN: "Is the pain constant?" (e.g., yes, no; constant, intermittent)      Constant  6. CAUSE:  "What do you think is causing the back pain?"      Unsure if bursitis or constipation 10. OTHER SYMPTOMS: "Do you have any other symptoms?" (e.g., fever, abdomen pain, burning with urination, blood in urine)       LBM 4 days  Protocols used: Back Pain-A-AH

## 2022-10-21 NOTE — Progress Notes (Signed)
I,J'ya E Hunter,acting as a scribe for Jacky Kindle, FNP.,have documented all relevant documentation on the behalf of Jacky Kindle, FNP,as directed by  Jacky Kindle, FNP while in the presence of Jacky Kindle, FNP.   Established patient visit   Patient: Kristin Mora   DOB: 1955/12/30   67 y.o. Female  MRN: [id number] Visit Date: 10/21/2022  Today's healthcare provider: Jacky Kindle, FNP  Introduced to nurse practitioner role and practice setting.  All questions answered.  Discussed provider/patient relationship and expectations.  Chief Complaint  Patient presents with   Back Pain   Subjective    HPI  Back Pain  She reports recurrent back pain. She reports that last year she was diagnosed with bursitis. There was not an injury that may have caused the pain. The most recent episode started about a year ago and is staying constant. The pain is located in the right lumbar areawithout radiation. It is described as aching and pinching, is 8/10 in intensity, occurring constantly. Symptoms are worse in the: all day  Aggravating factors: bending backwards, bending forwards, sitting, standing, and walking Relieving factors:   .  She has tried NSAIDs and topical anesthetics with no relief.   Associated symptoms: No abdominal pain No bowel incontinence, last bowel movement 4 days ago  No chest pain No dysuria   No fever No headaches  No joint pains No pelvic pain  No weakness in leg  No tingling in lower extremities  No urinary incontinence No weight loss    -----------------------------------------------------------------------------------------   Medications: Outpatient Medications Prior to Visit  Medication Sig   amLODipine (NORVASC) 10 MG tablet Take 1 tablet by mouth once daily   atorvastatin (LIPITOR) 80 MG tablet Take 1 tablet by mouth once daily   Blood Pressure Monitoring (BLOOD PRESSURE DIGITAL SOLN) KIT Dispense one automatic/digital blood pressure machine; check  blood pressure twice daily in AM and PM   hydrocortisone 1 % lotion Apply 1 Application topically 2 (two) times daily. For itching   ketoconazole (NIZORAL) 2 % cream Apply 1 Application topically daily. On R armpit, under R breast   losartan-hydrochlorothiazide (HYZAAR) 100-25 MG tablet TAKE 1 TABLET BY MOUTH ONCE DAILY . APPOINTMENT REQUIRED FOR FUTURE REFILLS   metFORMIN (GLUCOPHAGE) 500 MG tablet TAKE 1 TABLET BY MOUTH TWICE DAILY WITH A MEAL   Miconazole-Zinc Oxide-Petrolat 0.25-15-81.35 % OINT Apply to affected skin twice a day until clear   oxybutynin (DITROPAN) 5 MG tablet Take 1 tablet (5 mg total) by mouth 2 (two) times daily.   OZEMPIC, 2 MG/DOSE, 8 MG/3ML SOPN INJECT 2MG  SUBCUTANEOUSLY ONCE A WEEK   terbinafine (LAMISIL) 1 % cream Apply 1 Application topically 2 (two) times daily. To affected areas of rash under breast and under arms   triamcinolone (KENALOG) 0.025 % ointment Apply 1 Application topically 2 (two) times daily.   [DISCONTINUED] Vitamin D, Ergocalciferol, (DRISDOL) 1.25 MG (50000 UNIT) CAPS capsule Take 1 capsule by mouth once a week   No facility-administered medications prior to visit.    Review of Systems    Objective    BP (!) 174/75 (BP Location: Right Arm, Patient Position: Sitting, Cuff Size: Large)   Pulse 72   Temp 98.2 F (36.8 C) (Oral)   Resp 12   Wt 229 lb (103.9 kg)   SpO2 100%   BMI 41.88 kg/m   Physical Exam Vitals and nursing note reviewed.  Constitutional:      General: She  is not in acute distress.    Appearance: Normal appearance. She is obese. She is not ill-appearing, toxic-appearing or diaphoretic.  HENT:     Head: Normocephalic and atraumatic.  Cardiovascular:     Rate and Rhythm: Normal rate and regular rhythm.     Pulses: Normal pulses.     Heart sounds: Normal heart sounds. No murmur heard.    No friction rub. No gallop.  Pulmonary:     Effort: Pulmonary effort is normal. No respiratory distress.     Breath sounds: Normal  breath sounds. No stridor. No wheezing, rhonchi or rales.  Chest:     Chest wall: No tenderness.  Abdominal:     General: Bowel sounds are normal. There is no distension.     Palpations: Abdomen is soft.     Tenderness: There is no abdominal tenderness. There is no right CVA tenderness, left CVA tenderness or guarding.  Musculoskeletal:        General: Tenderness present. No swelling, deformity or signs of injury. Normal range of motion.       Arms:     Right lower leg: No edema.     Left lower leg: No edema.  Skin:    General: Skin is warm and dry.     Capillary Refill: Capillary refill takes less than 2 seconds.     Coloration: Skin is not jaundiced or pale.     Findings: No bruising, erythema, lesion or rash.  Neurological:     General: No focal deficit present.     Mental Status: She is alert and oriented to person, place, and time. Mental status is at baseline.     Cranial Nerves: No cranial nerve deficit.     Sensory: No sensory deficit.     Motor: No weakness.     Coordination: Coordination normal.  Psychiatric:        Mood and Affect: Mood normal.        Behavior: Behavior normal.        Thought Content: Thought content normal.        Judgment: Judgment normal.     No results found for any visits on 10/21/22.  Assessment & Plan     Problem List Items Addressed This Visit       Cardiovascular and Mediastinum   Hypertension associated with diabetes (HCC)    Chronic, remains elevated Recommend addition of hydral 25 mg tid if able to assist previous medications Goal of <119/<79 given DM      Relevant Medications   hydrALAZINE (APRESOLINE) 25 MG tablet   Other Relevant Orders   Hemoglobin A1c   Urine Microalbumin w/creat. ratio     Digestive   Drug-induced constipation    Acute on chronic, no BM x 4 days Normal faint BS No sick contacts No travel Recommend use of PRN lactulose to assist Iso of ozempic for DM/weight mgmt         Other   Acute  right-sided low back pain without sciatica - Primary    Acute, unknown cause Recommend use of bowel regimen as well as low dose tramadol to assist Continue to monitor s/s with PCP next week  Negative CVA tenderness Denies falls/injury May need PT/imaging at later date      Relevant Medications   traMADol (ULTRAM) 50 MG tablet   Vitamin D deficiency    Chronic, not on supplementation Restart and plan for labs with PCP      Relevant Medications   Vitamin  D, Ergocalciferol, (DRISDOL) 1.25 MG (50000 UNIT) CAPS capsule   Appt with PCP 4/30    I, Jacky Kindle, FNP, have reviewed all documentation for this visit. The documentation on 10/21/22 for the exam, diagnosis, procedures, and orders are all accurate and complete.  Jacky Kindle, FNP  [facility] [phone] (phone) [phone] (fax)  [facility]

## 2022-10-21 NOTE — Patient Instructions (Signed)
3 medications to pick up -constipation liquid -as needed pain pills for back -new blood pressure medication

## 2022-10-21 NOTE — Assessment & Plan Note (Signed)
Acute on chronic, no BM x 4 days Normal faint BS No sick contacts No travel Recommend use of PRN lactulose to assist Iso of ozempic for DM/weight mgmt

## 2022-10-22 LAB — MICROALBUMIN / CREATININE URINE RATIO
Creatinine, Urine: 127 mg/dL
Microalb/Creat Ratio: 30 mg/g creat — ABNORMAL HIGH (ref 0–29)
Microalbumin, Urine: 38.6 ug/mL

## 2022-10-22 LAB — HEMOGLOBIN A1C
Est. average glucose Bld gHb Est-mCnc: 126 mg/dL
Hgb A1c MFr Bld: 6 % — ABNORMAL HIGH (ref 4.8–5.6)

## 2022-10-23 NOTE — Progress Notes (Signed)
A1c remains well controlled; however, slight increase in urine micro. Continue to recommend balanced, lower carb meals. Smaller meal size, adding snacks. Choosing water as drink of choice and increasing purposeful exercise.

## 2022-10-25 ENCOUNTER — Ambulatory Visit: Payer: 59 | Admitting: Physician Assistant

## 2022-11-04 ENCOUNTER — Ambulatory Visit (INDEPENDENT_AMBULATORY_CARE_PROVIDER_SITE_OTHER): Payer: 59 | Admitting: Physician Assistant

## 2022-11-04 ENCOUNTER — Encounter: Payer: Self-pay | Admitting: Physician Assistant

## 2022-11-04 VITALS — BP 136/53 | HR 74 | Ht 62.0 in | Wt 224.0 lb

## 2022-11-04 DIAGNOSIS — I152 Hypertension secondary to endocrine disorders: Secondary | ICD-10-CM | POA: Diagnosis not present

## 2022-11-04 DIAGNOSIS — E1159 Type 2 diabetes mellitus with other circulatory complications: Secondary | ICD-10-CM | POA: Diagnosis not present

## 2022-11-04 DIAGNOSIS — M545 Low back pain, unspecified: Secondary | ICD-10-CM | POA: Diagnosis not present

## 2022-11-04 NOTE — Assessment & Plan Note (Signed)
Now managed with norvasc 10, losartan 100 hctz 25 and hydral 25 tid.  Happy with range currently.  Given pt's mobility and fall risk concerned further lowering would result in hypotension and falls

## 2022-11-04 NOTE — Progress Notes (Signed)
Established patient visit   Patient: Kristin Mora   DOB: 1955/10/15   67 y.o. Female  MRN: [id number] Visit Date: 11/04/2022  Today's healthcare provider: Alfredia Ferguson, PA-C   Chief Complaint  Patient presents with   Follow-up   Subjective    HPI   Hypertension, follow-up  BP Readings from Last 3 Encounters:  11/04/22 (!) 136/53  10/21/22 (!) 174/75  08/17/22 (!) 147/84   Wt Readings from Last 3 Encounters:  11/04/22 224 lb (101.6 kg)  10/21/22 229 lb (103.9 kg)  08/17/22 237 lb (107.5 kg)     Pt is compliant with addition of hydralazine. No dizziness.  Pertinent labs Lab Results  Component Value Date   CHOL 117 07/08/2022   HDL 36 (L) 07/08/2022   LDLCALC 67 07/08/2022   TRIG 67 07/08/2022   CHOLHDL 3.3 07/08/2022   Lab Results  Component Value Date   NA 144 07/08/2022   K 4.3 07/08/2022   CREATININE 0.71 07/08/2022   EGFR 93 07/08/2022   GLUCOSE 86 07/08/2022   TSH 1.870 07/27/2020     The ASCVD Risk score (Arnett DK, et al., 2019) failed to calculate for the following reasons:   The patient has a prior MI or stroke diagnosis  --------------------------------------------------------------------------------------------------- Reports her back pain is improved.  Medications: Outpatient Medications Prior to Visit  Medication Sig   amLODipine (NORVASC) 10 MG tablet Take 1 tablet by mouth once daily   atorvastatin (LIPITOR) 80 MG tablet Take 1 tablet by mouth once daily   Blood Pressure Monitoring (BLOOD PRESSURE DIGITAL SOLN) KIT Dispense one automatic/digital blood pressure machine; check blood pressure twice daily in AM and PM   hydrALAZINE (APRESOLINE) 25 MG tablet Take 1 tablet (25 mg total) by mouth 3 (three) times daily.   hydrocortisone 1 % lotion Apply 1 Application topically 2 (two) times daily. For itching   ketoconazole (NIZORAL) 2 % cream Apply 1 Application topically daily. On R armpit, under R breast   lactulose (CHRONULAC) 10  GM/15ML solution Take 15 mLs (10 g total) by mouth 2 (two) times daily as needed for severe constipation.   losartan-hydrochlorothiazide (HYZAAR) 100-25 MG tablet TAKE 1 TABLET BY MOUTH ONCE DAILY . APPOINTMENT REQUIRED FOR FUTURE REFILLS   metFORMIN (GLUCOPHAGE) 500 MG tablet TAKE 1 TABLET BY MOUTH TWICE DAILY WITH A MEAL   Miconazole-Zinc Oxide-Petrolat 0.25-15-81.35 % OINT Apply to affected skin twice a day until clear   oxybutynin (DITROPAN) 5 MG tablet Take 1 tablet (5 mg total) by mouth 2 (two) times daily.   OZEMPIC, 2 MG/DOSE, 8 MG/3ML SOPN INJECT 2MG  SUBCUTANEOUSLY ONCE A WEEK   terbinafine (LAMISIL) 1 % cream Apply 1 Application topically 2 (two) times daily. To affected areas of rash under breast and under arms   triamcinolone (KENALOG) 0.025 % ointment Apply 1 Application topically 2 (two) times daily.   Vitamin D, Ergocalciferol, (DRISDOL) 1.25 MG (50000 UNIT) CAPS capsule Take 1 capsule (50,000 Units total) by mouth once a week.   No facility-administered medications prior to visit.    Review of Systems  Constitutional:  Negative for fatigue and fever.  Respiratory:  Negative for cough and shortness of breath.   Cardiovascular:  Negative for chest pain and leg swelling.  Gastrointestinal:  Negative for abdominal pain.  Neurological:  Negative for dizziness and headaches.       Objective    BP (!) 136/53   Pulse 74   Ht 5\' 2"  (1.575 m)  Wt 224 lb (101.6 kg)   SpO2 98%   BMI 40.97 kg/m   Physical Exam Constitutional:      General: She is awake.     Appearance: She is well-developed.  HENT:     Head: Normocephalic.  Eyes:     Conjunctiva/sclera: Conjunctivae normal.  Cardiovascular:     Rate and Rhythm: Normal rate and regular rhythm.     Heart sounds: Normal heart sounds.  Pulmonary:     Effort: Pulmonary effort is normal.     Breath sounds: Normal breath sounds.  Musculoskeletal:     Right lower leg: No edema.     Left lower leg: No edema.  Skin:     General: Skin is warm.  Neurological:     Mental Status: She is alert and oriented to person, place, and time.  Psychiatric:        Attention and Perception: Attention normal.        Mood and Affect: Mood normal.        Speech: Speech normal.        Behavior: Behavior is cooperative.      No results found for any visits on 11/04/22.  Assessment & Plan     Problem List Items Addressed This Visit       Cardiovascular and Mediastinum   Hypertension associated with diabetes (HCC) - Primary    Now managed with norvasc 10, losartan 100 hctz 25 and hydral 25 tid.  Happy with range currently.  Given pt's mobility and fall risk concerned further lowering would result in hypotension and falls        Other   Acute right-sided low back pain without sciatica   2. Acute right-sided low back pain without sciatica Resolved  Return in about 5 months (around 04/06/2023) for DMII.      I, Alfredia Ferguson, PA-C have reviewed all documentation for this visit. The documentation on  11/04/22   for the exam, diagnosis, procedures, and orders are all accurate and complete.  Alfredia Ferguson, PA-C [facility] [address] [place], [place], [address] Office: [phone] Fax: [phone]   [facility]

## 2022-11-07 ENCOUNTER — Ambulatory Visit: Payer: Self-pay

## 2022-11-07 ENCOUNTER — Telehealth: Payer: Self-pay

## 2022-11-07 ENCOUNTER — Other Ambulatory Visit: Payer: Self-pay | Admitting: Physician Assistant

## 2022-11-07 DIAGNOSIS — M25552 Pain in left hip: Secondary | ICD-10-CM

## 2022-11-07 DIAGNOSIS — R5381 Other malaise: Secondary | ICD-10-CM

## 2022-11-07 DIAGNOSIS — G8929 Other chronic pain: Secondary | ICD-10-CM

## 2022-11-07 NOTE — Telephone Encounter (Signed)
  Chief Complaint: hip pain Symptoms: L hip pain, 8/10, constant Frequency: last night Pertinent Negatives: Patient denies swelling or radiates into LLE Disposition: [] ED /[] Urgent Care (no appt availability in office) / [x] Appointment(In office/virtual)/ []  [facility]/ [] Home Care/ [] Refused Recommended Disposition /[] [place] [facility]/ []  Follow-up with PCP Additional Notes: pt states this pain is similar to R hip pain she been dealing with long time and asking for muscle relaxer. No muscle relaxers no longer on med list and pt agreed to appt. Scheduled for tomorrow with PCP at 1500. Also advised pt of PT referral had been placed.   Reason for Disposition  [1] SEVERE pain (e.g., excruciating, unable to do any normal activities) AND [2] not improved after 2 hours of pain medicine  Answer Assessment - Initial Assessment Questions 1. LOCATION and RADIATION: "Where is the pain located?"      L hip  3. SEVERITY: "How bad is the pain?" "What does it keep you from doing?"   (Scale 1-10; or mild, moderate, severe)   -  MILD (1-3): doesn't interfere with normal activities    -  MODERATE (4-7): interferes with normal activities (e.g., work or school) or awakens from sleep, limping    -  SEVERE (8-10): excruciating pain, unable to do any normal activities, unable to walk     8/10 4. ONSET: "When did the pain start?" "Does it come and go, or is it there all the time?"     Last night  7. AGGRAVATING FACTORS: "What makes the hip pain worse?" (e.g., walking, climbing stairs, running)     Walking  8. OTHER SYMPTOMS: "Do you have any other symptoms?" (e.g., back pain, pain shooting down leg,  fever, rash)     no  Protocols used: Hip Pain-A-AH

## 2022-11-07 NOTE — Telephone Encounter (Signed)
Copied from CRM [id number]. Topic: General - Other >> Nov 07, 2022  8:30 AM Franchot Heidelberg wrote: Reason for CRM: Pt wants to proceed forward with receiving Physical Therapy for her hip, says she discussed this with her PCP.

## 2022-11-08 ENCOUNTER — Ambulatory Visit: Payer: 59 | Admitting: Physician Assistant

## 2022-11-08 NOTE — Progress Notes (Deleted)
      Established patient visit   Patient: Kristin Mora   DOB: Dec 04, 1955   67 y.o. Female  MRN: [id number] Visit Date: 11/08/2022  Today's healthcare provider: Alfredia Ferguson, PA-C   No chief complaint on file.  Subjective    Hip Pain  The pain is present in the left hip.   ***  Medications: Outpatient Medications Prior to Visit  Medication Sig  . amLODipine (NORVASC) 10 MG tablet Take 1 tablet by mouth once daily  . atorvastatin (LIPITOR) 80 MG tablet Take 1 tablet by mouth once daily  . Blood Pressure Monitoring (BLOOD PRESSURE DIGITAL SOLN) KIT Dispense one automatic/digital blood pressure machine; check blood pressure twice daily in AM and PM  . hydrALAZINE (APRESOLINE) 25 MG tablet Take 1 tablet (25 mg total) by mouth 3 (three) times daily.  . hydrocortisone 1 % lotion Apply 1 Application topically 2 (two) times daily. For itching  . ketoconazole (NIZORAL) 2 % cream Apply 1 Application topically daily. On R armpit, under R breast  . lactulose (CHRONULAC) 10 GM/15ML solution Take 15 mLs (10 g total) by mouth 2 (two) times daily as needed for severe constipation.  Marland Kitchen losartan-hydrochlorothiazide (HYZAAR) 100-25 MG tablet TAKE 1 TABLET BY MOUTH ONCE DAILY . APPOINTMENT REQUIRED FOR FUTURE REFILLS  . metFORMIN (GLUCOPHAGE) 500 MG tablet TAKE 1 TABLET BY MOUTH TWICE DAILY WITH A MEAL  . Miconazole-Zinc Oxide-Petrolat 0.25-15-81.35 % OINT Apply to affected skin twice a day until clear  . oxybutynin (DITROPAN) 5 MG tablet Take 1 tablet (5 mg total) by mouth 2 (two) times daily.  Marland Kitchen OZEMPIC, 2 MG/DOSE, 8 MG/3ML SOPN INJECT 2MG  SUBCUTANEOUSLY ONCE A WEEK  . terbinafine (LAMISIL) 1 % cream Apply 1 Application topically 2 (two) times daily. To affected areas of rash under breast and under arms  . triamcinolone (KENALOG) 0.025 % ointment Apply 1 Application topically 2 (two) times daily.  . Vitamin D, Ergocalciferol, (DRISDOL) 1.25 MG (50000 UNIT) CAPS capsule Take 1 capsule (50,000  Units total) by mouth once a week.   No facility-administered medications prior to visit.    Review of Systems  {Labs  Heme  Chem  Endocrine  Serology  Results Review (optional):23779}   Objective    There were no vitals taken for this visit. {Show previous vital signs (optional):23777}  Physical Exam  ***  No results found for any visits on 11/08/22.  Assessment & Plan     ***  No follow-ups on file.      {provider attestation***:1}   Alfredia Ferguson, PA-C  [facility] [phone] (phone) [phone] (fax)  [facility]

## 2022-11-11 ENCOUNTER — Encounter: Payer: Self-pay | Admitting: Physician Assistant

## 2022-11-11 ENCOUNTER — Ambulatory Visit (INDEPENDENT_AMBULATORY_CARE_PROVIDER_SITE_OTHER): Payer: 59 | Admitting: Physician Assistant

## 2022-11-11 VITALS — BP 119/54 | HR 82 | Temp 97.8°F | Resp 12 | Wt 224.0 lb

## 2022-11-11 DIAGNOSIS — M25552 Pain in left hip: Secondary | ICD-10-CM | POA: Diagnosis not present

## 2022-11-11 MED ORDER — LIDOCAINE 5 % EX PTCH: 1.0000 | MEDICATED_PATCH | CUTANEOUS | 0 refills | Status: AC

## 2022-11-11 NOTE — Progress Notes (Signed)
I,Sulibeya S Dimas,acting as a Neurosurgeon for [employer], PA-C.,have documented all relevant documentation on the behalf of Alfredia Ferguson, PA-C,as directed by  Alfredia Ferguson, PA-C while in the presence of Alfredia Ferguson, PA-C.     Established patient visit   Patient: Kristin Mora   DOB: 03-22-1956   67 y.o. Female  MRN: [id number] Visit Date: 11/11/2022  Today's healthcare provider: Alfredia Ferguson, PA-C   Chief Complaint  Patient presents with   Hip Pain   Subjective    Hip Pain  There was no injury mechanism. The pain is present in the left hip. The quality of the pain is described as stabbing and aching. The pain is mild. The pain has been Improving since onset. She reports no foreign bodies present. The symptoms are aggravated by movement. She has tried NSAIDs (patches) for the symptoms. The treatment provided moderate relief.   Medications: Outpatient Medications Prior to Visit  Medication Sig   amLODipine (NORVASC) 10 MG tablet Take 1 tablet by mouth once daily   atorvastatin (LIPITOR) 80 MG tablet Take 1 tablet by mouth once daily   Blood Pressure Monitoring (BLOOD PRESSURE DIGITAL SOLN) KIT Dispense one automatic/digital blood pressure machine; check blood pressure twice daily in AM and PM   hydrALAZINE (APRESOLINE) 25 MG tablet Take 1 tablet (25 mg total) by mouth 3 (three) times daily.   hydrocortisone 1 % lotion Apply 1 Application topically 2 (two) times daily. For itching   ketoconazole (NIZORAL) 2 % cream Apply 1 Application topically daily. On R armpit, under R breast   lactulose (CHRONULAC) 10 GM/15ML solution Take 15 mLs (10 g total) by mouth 2 (two) times daily as needed for severe constipation.   losartan-hydrochlorothiazide (HYZAAR) 100-25 MG tablet TAKE 1 TABLET BY MOUTH ONCE DAILY . APPOINTMENT REQUIRED FOR FUTURE REFILLS   metFORMIN (GLUCOPHAGE) 500 MG tablet TAKE 1 TABLET BY MOUTH TWICE DAILY WITH A MEAL   Miconazole-Zinc Oxide-Petrolat 0.25-15-81.35 %  OINT Apply to affected skin twice a day until clear   oxybutynin (DITROPAN) 5 MG tablet Take 1 tablet (5 mg total) by mouth 2 (two) times daily.   OZEMPIC, 2 MG/DOSE, 8 MG/3ML SOPN INJECT 2MG  SUBCUTANEOUSLY ONCE A WEEK   terbinafine (LAMISIL) 1 % cream Apply 1 Application topically 2 (two) times daily. To affected areas of rash under breast and under arms   triamcinolone (KENALOG) 0.025 % ointment Apply 1 Application topically 2 (two) times daily.   Vitamin D, Ergocalciferol, (DRISDOL) 1.25 MG (50000 UNIT) CAPS capsule Take 1 capsule (50,000 Units total) by mouth once a week.   No facility-administered medications prior to visit.    Review of Systems  Constitutional:  Negative for chills and fever.  Respiratory:  Negative for shortness of breath.   Cardiovascular:  Negative for chest pain and leg swelling.  Musculoskeletal:  Positive for arthralgias and myalgias.      Objective    BP (!) 119/54 (BP Location: Right Arm, Patient Position: Sitting, Cuff Size: Large)   Pulse 82   Temp 97.8 F (36.6 C)   Resp 12   Wt 224 lb (101.6 kg)   SpO2 99%   BMI 40.97 kg/m    Physical Exam Vitals reviewed.  Constitutional:      Appearance: She is not ill-appearing.  HENT:     Head: Normocephalic.  Eyes:     Conjunctiva/sclera: Conjunctivae normal.  Cardiovascular:     Rate and Rhythm: Normal rate.  Pulmonary:     Effort: Pulmonary  effort is normal. No respiratory distress.  Neurological:     General: No focal deficit present.     Mental Status: She is alert and oriented to person, place, and time.  Psychiatric:        Mood and Affect: Mood normal.        Behavior: Behavior normal.      No results found for any visits on 11/11/22.  Assessment & Plan     1. Left hip pain Chronic. Pain is managed with lidocaine patches, refilled. Physical therapy rx is in already   Return if symptoms worsen or fail to improve.      I, Alfredia Ferguson, PA-C have reviewed all documentation  for this visit. The documentation on  11/11/22   for the exam, diagnosis, procedures, and orders are all accurate and complete.  Alfredia Ferguson, PA-C [facility] [address] [place], [place], [address] Office: [phone] Fax: [phone]   [facility]

## 2022-11-15 ENCOUNTER — Telehealth: Payer: Self-pay | Admitting: Physician Assistant

## 2022-11-15 NOTE — Telephone Encounter (Signed)
Covermymeds requestiong prior authorization Key: [id number] Name: Sprenkle Lidocaine 5% Patches

## 2022-11-30 IMAGING — CR DG LUMBAR SPINE COMPLETE 4+V
1 series · 5 of 5 positions shown · non-contrast
Comparison: X-ray lumbar 04/03/2017.

CLINICAL DATA: Low back pain; urinary urgency-frequency. Lower back
pain radiates down left knee for 2 weeks.

EXAM:
LUMBAR SPINE - COMPLETE 4+ VIEW

[Series 1: dg lumbar spine complete 4 +v · 0.14mm/px · 5 of 5 slices shown]
[im 1/5]
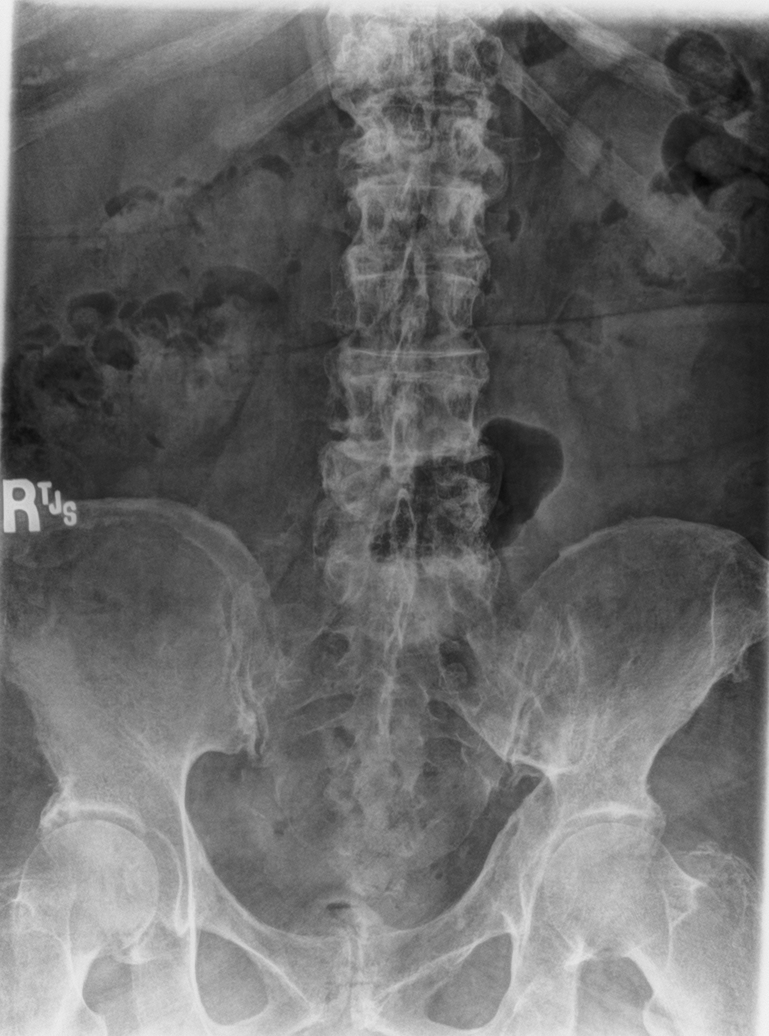
[im 2/5]
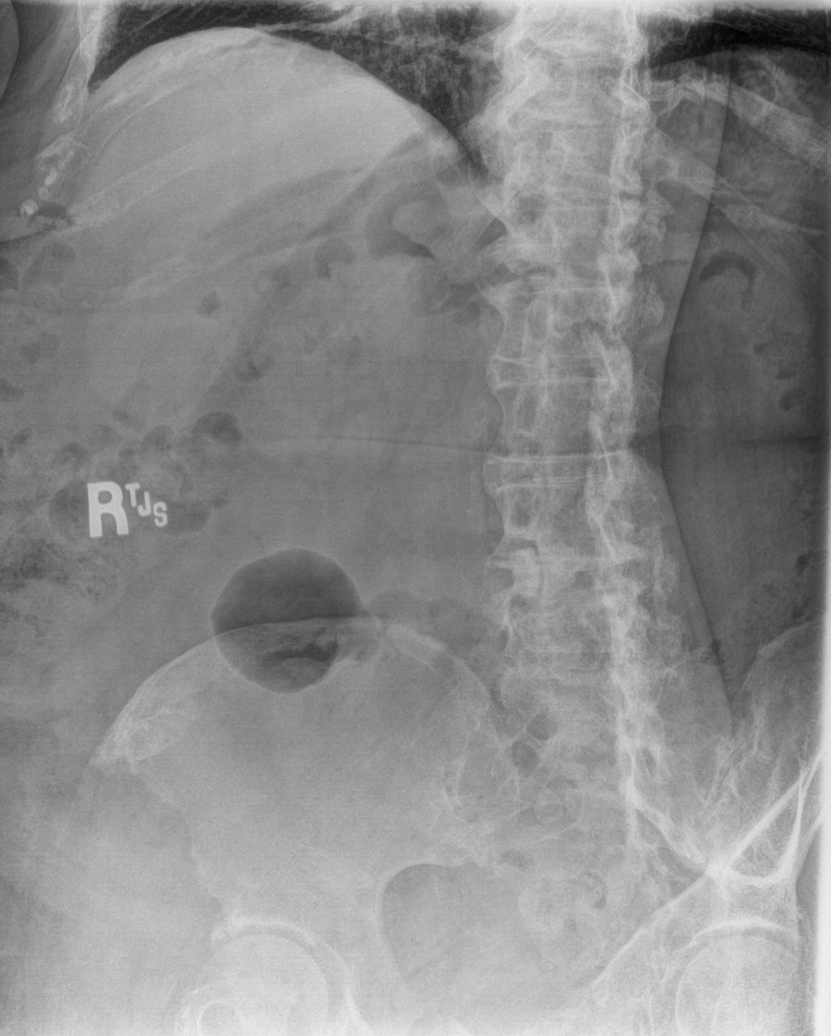
[im 3/5]
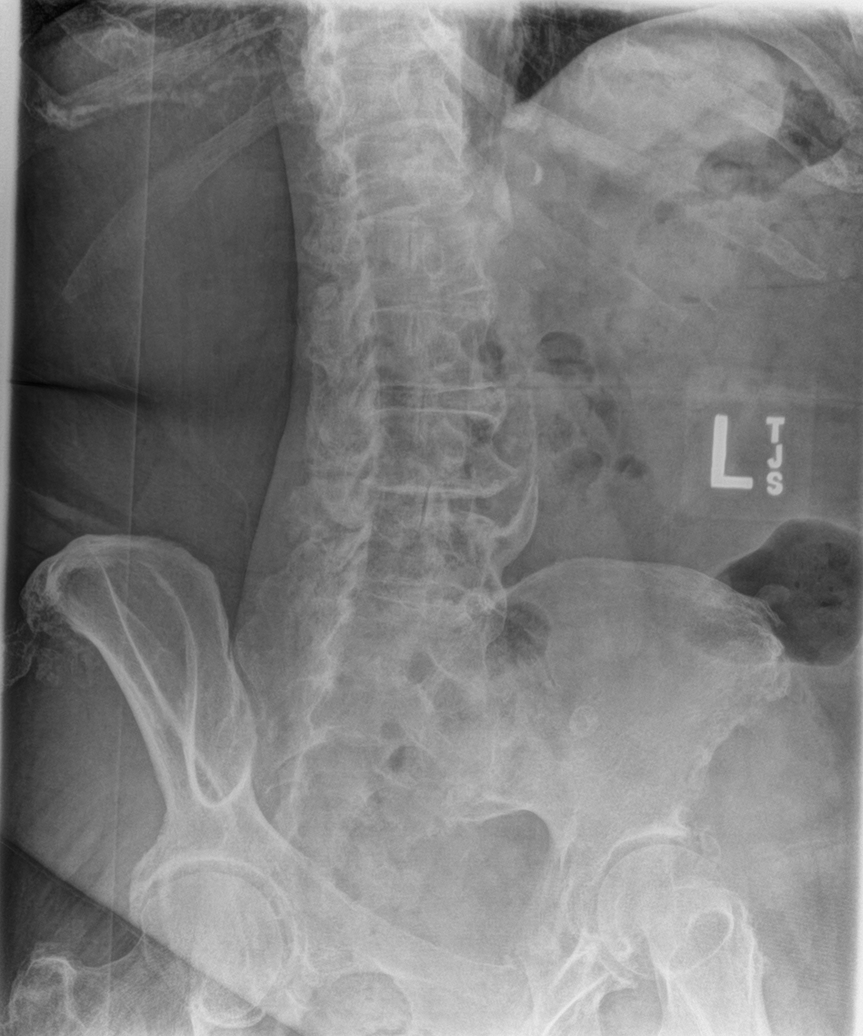
[im 4/5]
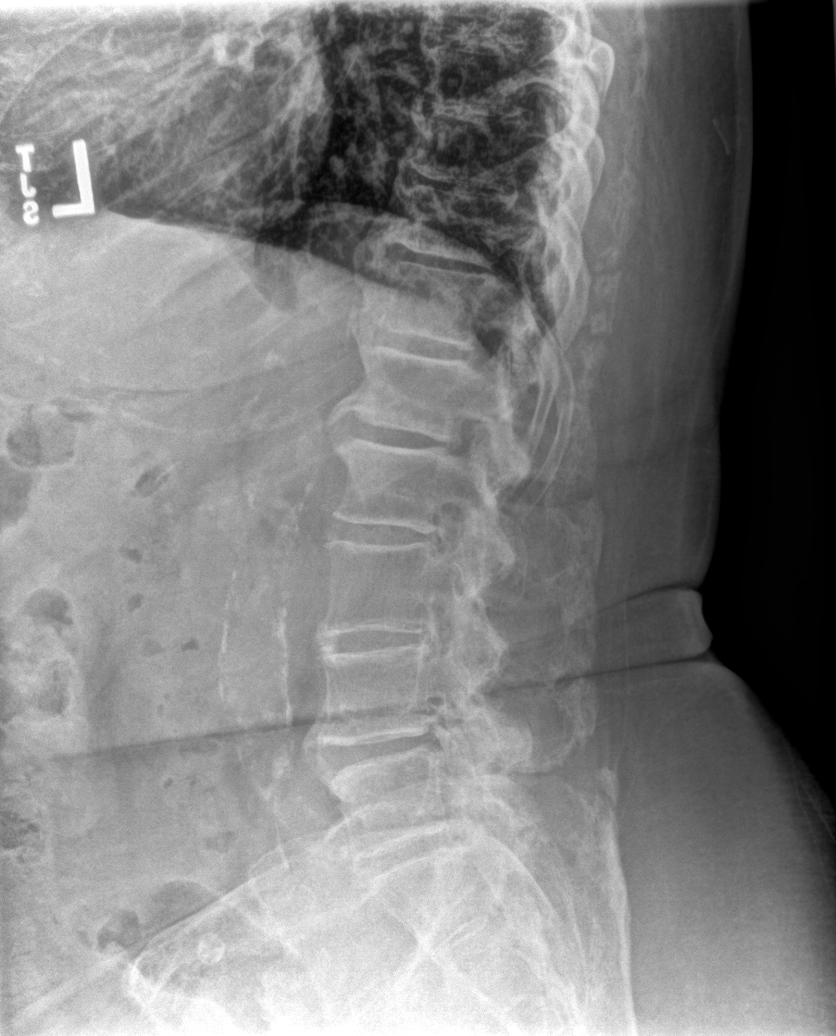
[im 5/5]
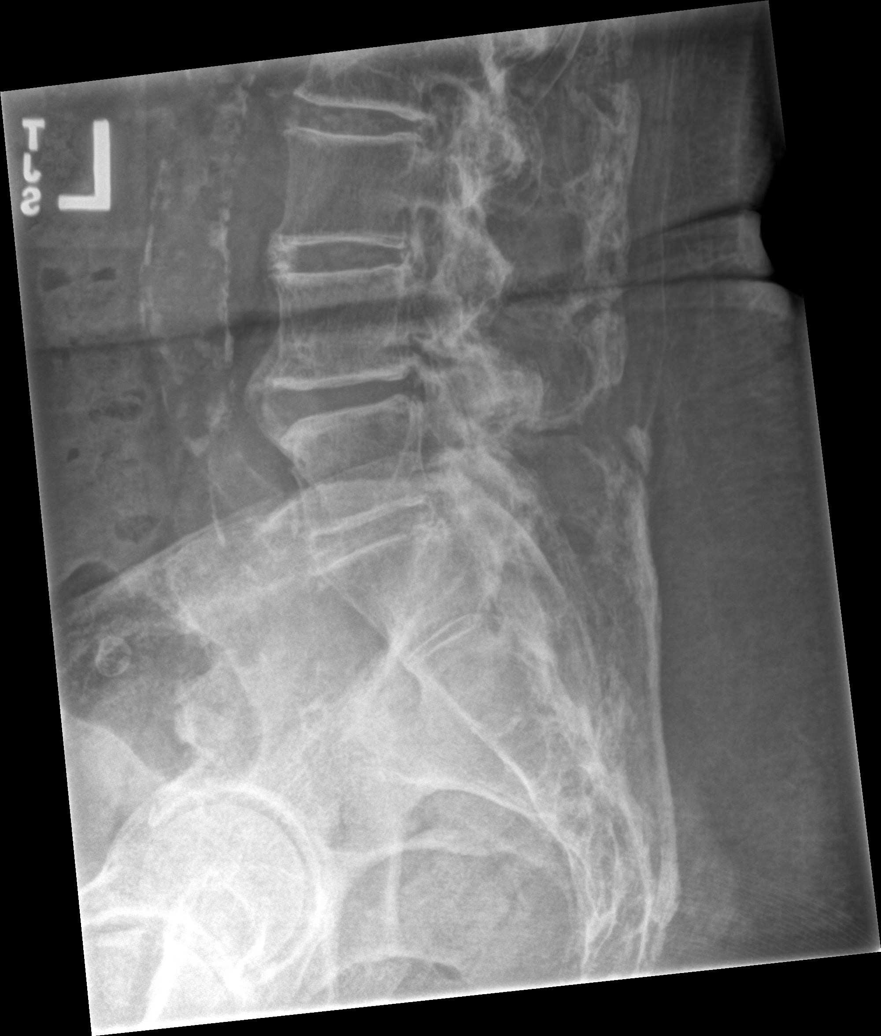

[5 of 5 positions shown; findings below may reference images not displayed]

FINDINGS: Normal lumbar alignment.  Negative for fracture or mass.

Mild disc degeneration in the lumbar spine. Diffuse facet
arthropathy. No pars defect

Atherosclerotic calcification aorta without dilatation
IMPRESSION: Multilevel disc and facet degeneration. No acute abnormality and no
interval change from the prior study

## 2022-12-05 ENCOUNTER — Telehealth: Payer: Self-pay

## 2022-12-05 ENCOUNTER — Inpatient Hospital Stay: Admission: RE | Admit: 2022-12-05 | Payer: 59 | Source: Ambulatory Visit

## 2022-12-05 NOTE — Telephone Encounter (Signed)
Copied from CRM [id number]. Topic: General - Other >> Dec 05, 2022  4:41 PM Ja-Kwan M wrote: Reason for CRM: Pt reports that she wanted to make provider aware that she stopped taking the hydrALAZINE (APRESOLINE) 25 MG tablet because it was causing her to be light headed.

## 2022-12-06 NOTE — Telephone Encounter (Signed)
Patient was advised and stated she would make an appointment

## 2022-12-06 NOTE — Telephone Encounter (Signed)
PA expired. Renewed today.

## 2022-12-06 NOTE — Telephone Encounter (Signed)
This medication or product was previously approved on [id number] from 2022-11-29 to 2023-06-27. [facility] pharmacy advised. Patient advised.

## 2022-12-07 NOTE — Progress Notes (Deleted)
     I,Rosalynd Mcwright,acting as a Neurosurgeon for [employer], PA-C.,have documented all relevant documentation on the behalf of Alfredia Ferguson, PA-C,as directed by  Alfredia Ferguson, PA-C while in the presence of Alfredia Ferguson, PA-C.   Established patient visit   Patient: Kristin Mora   DOB: 02-09-1956   67 y.o. Female  MRN: [id number] Visit Date: 12/08/2022  Today's healthcare provider: Alfredia Ferguson, PA-C   No chief complaint on file.  Subjective    HPI  ***  Medications: Outpatient Medications Prior to Visit  Medication Sig   amLODipine (NORVASC) 10 MG tablet Take 1 tablet by mouth once daily   atorvastatin (LIPITOR) 80 MG tablet Take 1 tablet by mouth once daily   Blood Pressure Monitoring (BLOOD PRESSURE DIGITAL SOLN) KIT Dispense one automatic/digital blood pressure machine; check blood pressure twice daily in AM and PM   hydrALAZINE (APRESOLINE) 25 MG tablet Take 1 tablet (25 mg total) by mouth 3 (three) times daily.   hydrocortisone 1 % lotion Apply 1 Application topically 2 (two) times daily. For itching   ketoconazole (NIZORAL) 2 % cream Apply 1 Application topically daily. On R armpit, under R breast   lactulose (CHRONULAC) 10 GM/15ML solution Take 15 mLs (10 g total) by mouth 2 (two) times daily as needed for severe constipation.   lidocaine (LIDODERM) 5 % Place 1 patch onto the skin daily. Remove & Discard patch within 12 hours or as directed by MD   losartan-hydrochlorothiazide (HYZAAR) 100-25 MG tablet TAKE 1 TABLET BY MOUTH ONCE DAILY . APPOINTMENT REQUIRED FOR FUTURE REFILLS   metFORMIN (GLUCOPHAGE) 500 MG tablet TAKE 1 TABLET BY MOUTH TWICE DAILY WITH A MEAL   Miconazole-Zinc Oxide-Petrolat 0.25-15-81.35 % OINT Apply to affected skin twice a day until clear   oxybutynin (DITROPAN) 5 MG tablet Take 1 tablet (5 mg total) by mouth 2 (two) times daily.   OZEMPIC, 2 MG/DOSE, 8 MG/3ML SOPN INJECT 2MG  SUBCUTANEOUSLY ONCE A WEEK   terbinafine (LAMISIL) 1 % cream Apply 1  Application topically 2 (two) times daily. To affected areas of rash under breast and under arms   triamcinolone (KENALOG) 0.025 % ointment Apply 1 Application topically 2 (two) times daily.   Vitamin D, Ergocalciferol, (DRISDOL) 1.25 MG (50000 UNIT) CAPS capsule Take 1 capsule (50,000 Units total) by mouth once a week.   No facility-administered medications prior to visit.    Review of Systems  {Labs  Heme  Chem  Endocrine  Serology  Results Review (optional):23779}   Objective    There were no vitals taken for this visit. {Show previous Bronislaw Switzer signs (optional):23777}  Physical Exam  ***  No results found for any visits on 12/08/22.  Assessment & Plan     ***  No follow-ups on file.      {provider attestation***:1}   Alfredia Ferguson, PA-C  [facility] [phone] (phone) [phone] (fax)  [facility]

## 2022-12-08 ENCOUNTER — Ambulatory Visit: Payer: 59 | Admitting: Physician Assistant

## 2022-12-12 ENCOUNTER — Encounter: Payer: Self-pay | Admitting: Physician Assistant

## 2022-12-12 ENCOUNTER — Ambulatory Visit (INDEPENDENT_AMBULATORY_CARE_PROVIDER_SITE_OTHER): Payer: 59 | Admitting: Physician Assistant

## 2022-12-12 VITALS — BP 118/69 | HR 81 | Temp 97.9°F | Resp 14 | Ht 62.0 in | Wt 220.2 lb

## 2022-12-12 DIAGNOSIS — I152 Hypertension secondary to endocrine disorders: Secondary | ICD-10-CM

## 2022-12-12 DIAGNOSIS — E1159 Type 2 diabetes mellitus with other circulatory complications: Secondary | ICD-10-CM | POA: Diagnosis not present

## 2022-12-12 NOTE — Assessment & Plan Note (Signed)
In range  Was likely hypotensive w/ hydral given her liquid diet. Ok to d/c Rec checking  bp weekly, f/b with office if cont dizziness

## 2022-12-12 NOTE — Progress Notes (Signed)
I,Vanessa  Vital,acting as a Neurosurgeon for [employer], PA-C.,have documented all relevant documentation on the behalf of Alfredia Ferguson, PA-C,as directed by  Alfredia Ferguson, PA-C while in the presence of Alfredia Ferguson, PA-C.   Established patient visit   Patient: Kristin Mora   DOB: [birth year]   67 y.o. Female  MRN: [id number] Visit Date: 12/12/2022  Today's healthcare provider: Alfredia Ferguson, PA-C   Cc. dizziness  Subjective    HPI  Pt reports last week feeling dizzy so she stopped her hydralazine. She has not checked her bp. She recently had dental surgery and has only been able to eat liquids.  Denies dizziness since stopping the hydralazine.   Medications: Outpatient Medications Prior to Visit  Medication Sig   amLODipine (NORVASC) 10 MG tablet Take 1 tablet by mouth once daily   atorvastatin (LIPITOR) 80 MG tablet Take 1 tablet by mouth once daily   Blood Pressure Monitoring (BLOOD PRESSURE DIGITAL SOLN) KIT Dispense one automatic/digital blood pressure machine; check blood pressure twice daily in AM and PM   hydrocortisone 1 % lotion Apply 1 Application topically 2 (two) times daily. For itching   ketoconazole (NIZORAL) 2 % cream Apply 1 Application topically daily. On R armpit, under R breast   lactulose (CHRONULAC) 10 GM/15ML solution Take 15 mLs (10 g total) by mouth 2 (two) times daily as needed for severe constipation.   lidocaine (LIDODERM) 5 % Place 1 patch onto the skin daily. Remove & Discard patch within 12 hours or as directed by MD   losartan-hydrochlorothiazide (HYZAAR) 100-25 MG tablet TAKE 1 TABLET BY MOUTH ONCE DAILY . APPOINTMENT REQUIRED FOR FUTURE REFILLS   metFORMIN (GLUCOPHAGE) 500 MG tablet TAKE 1 TABLET BY MOUTH TWICE DAILY WITH A MEAL   Miconazole-Zinc Oxide-Petrolat 0.25-15-81.35 % OINT Apply to affected skin twice a day until clear   oxybutynin (DITROPAN) 5 MG tablet Take 1 tablet (5 mg total) by mouth 2 (two) times daily.   OZEMPIC, 2  MG/DOSE, 8 MG/3ML SOPN INJECT 2MG  SUBCUTANEOUSLY ONCE A WEEK   terbinafine (LAMISIL) 1 % cream Apply 1 Application topically 2 (two) times daily. To affected areas of rash under breast and under arms   triamcinolone (KENALOG) 0.025 % ointment Apply 1 Application topically 2 (two) times daily.   Vitamin D, Ergocalciferol, (DRISDOL) 1.25 MG (50000 UNIT) CAPS capsule Take 1 capsule (50,000 Units total) by mouth once a week.   [DISCONTINUED] hydrALAZINE (APRESOLINE) 25 MG tablet Take 1 tablet (25 mg total) by mouth 3 (three) times daily.   No facility-administered medications prior to visit.    Review of Systems  All other systems reviewed and are negative.    Objective    BP 118/69 (BP Location: Left Arm, Patient Position: Sitting, Cuff Size: Large)   Pulse 81   Temp 97.9 F (36.6 C) (Oral)   Resp 14   Ht 5\' 2"  (1.575 m)   Wt 220 lb 3.2 oz (99.9 kg)   SpO2 100%   BMI 40.28 kg/m    Physical Exam Vitals reviewed.  Constitutional:      Appearance: She is not ill-appearing.  HENT:     Head: Normocephalic.  Eyes:     Conjunctiva/sclera: Conjunctivae normal.  Cardiovascular:     Rate and Rhythm: Normal rate.  Pulmonary:     Effort: Pulmonary effort is normal. No respiratory distress.  Neurological:     General: No focal deficit present.     Mental Status: She is alert and  oriented to person, place, and time.  Psychiatric:        Mood and Affect: Mood normal.        Behavior: Behavior normal.     No results found for any visits on 12/12/22.  Assessment & Plan     Problem List Items Addressed This Visit       Cardiovascular and Mediastinum   Hypertension associated with diabetes (HCC) - Primary    In range  Was likely hypotensive w/ hydral given her liquid diet. Ok to d/c Rec checking  bp weekly, f/b with office if cont dizziness        Return if symptoms worsen or fail to improve.      I, Alfredia Ferguson, PA-C have reviewed all documentation for this visit. The  documentation on  12/12/22   for the exam, diagnosis, procedures, and orders are all accurate and complete.  Alfredia Ferguson, PA-C [facility] [address] [place], [place], [address] Office: [phone] Fax: [phone]   [facility]

## 2022-12-16 ENCOUNTER — Other Ambulatory Visit: Payer: Self-pay | Admitting: Family Medicine

## 2022-12-27 ENCOUNTER — Other Ambulatory Visit: Payer: Self-pay | Admitting: Family Medicine

## 2022-12-27 DIAGNOSIS — E559 Vitamin D deficiency, unspecified: Secondary | ICD-10-CM

## 2023-01-02 ENCOUNTER — Telehealth: Payer: Self-pay | Admitting: Physician Assistant

## 2023-01-02 ENCOUNTER — Other Ambulatory Visit: Payer: Self-pay | Admitting: Physician Assistant

## 2023-01-02 ENCOUNTER — Other Ambulatory Visit: Payer: Self-pay | Admitting: Family Medicine

## 2023-01-02 DIAGNOSIS — E559 Vitamin D deficiency, unspecified: Secondary | ICD-10-CM

## 2023-01-02 NOTE — Telephone Encounter (Signed)
Requested by pharmacy today in refill encounter.

## 2023-01-02 NOTE — Telephone Encounter (Signed)
Medication Refill - Medication:  OZEMPIC, 2 MG/DOSE, 8 MG/3ML SOPN  Vitamin D, Ergocalciferol, (DRISDOL) 1.25 MG (50000 UNIT) CAPS capsule   Has the patient contacted their pharmacy? No. (Agent: If no, request that the patient contact the pharmacy for the refill. If patient does not wish to contact the pharmacy document the reason why and proceed with request.) (Agent: If yes, when and what did the pharmacy advise?)  Preferred Pharmacy (with phone number or street name):  [id number] - [place] ([address]), [place] - [address]   Has the patient been seen for an appointment in the last year OR does the patient have an upcoming appointment? Yes.    Agent: Please be advised that RX refills may take up to 3 business days. We ask that you follow-up with your pharmacy.

## 2023-01-05 NOTE — Therapy (Deleted)
OUTPATIENT PHYSICAL THERAPY EVALUATION   Patient Name: Kristin Mora MRN: [id number] DOB:September 19, 1955, 67 y.o., female Today's Date: 01/05/2023  END OF SESSION:   Past Medical History:  Diagnosis Date   Asthma    Brain aneurysm 04/2007   Diabetes mellitus without complication (HCC)    controlled;   HLD (hyperlipidemia)    Hypertension    controlled with medication   Stroke [facility])    Vertigo    Past Surgical History:  Procedure Laterality Date   COLONOSCOPY WITH PROPOFOL N/A 01/19/2022   Procedure: COLONOSCOPY WITH PROPOFOL;  Surgeon: Wyline Mood, MD;  Location: [facility] ENDOSCOPY;  Service: Gastroenterology;  Laterality: N/A;   CRANIOTOMY  04/28/2007   KNEE SURGERY     TONSILLECTOMY     TONSILLECTOMY AND ADENOIDECTOMY     TUBAL LIGATION     Patient Active Problem List   Diagnosis Date Noted   Vitamin D deficiency 10/21/2022   Acute right-sided low back pain without sciatica 10/21/2022   Drug-induced constipation 10/21/2022   Intertrigo 11/30/2021   Urinary urgency 08/25/2021   Strain of lumbar region 08/25/2021   Fall 07/30/2020   Acute pain of right knee 07/30/2020   History of right knee surgery 07/30/2020   Screening for breast cancer 10/20/2017   OSA (obstructive sleep apnea) 07/27/2017   Axillary hidradenitis suppurativa 06/29/2016   Vertigo 09/14/2015   Chronic airway obstruction (HCC) 11/04/2014   Acute cough 11/04/2014   Avitaminosis D 11/04/2014   Adiposity 11/04/2014   Hyperlipidemia associated with type 2 diabetes mellitus (HCC) 11/04/2014   Diabetes mellitus, type 2 (HCC) 10/01/2014   Hypertension associated with diabetes (HCC) 10/01/2014    PCP: Alfredia Ferguson, PA-C  REFERRING PROVIDER: Alfredia Ferguson, PA-C  REFERRING DIAG: chronic low back pain, unspecified back pain laterality, unspecified whether sciatica present, physical deconditioning, morbid obesity, left hip pain  Rationale for Evaluation and Treatment: Rehabilitation  THERAPY DIAG:   No diagnosis found.  ONSET DATE: ***  SUBJECTIVE:                                                                                                                                                                                           SUBJECTIVE STATEMENT: ***  Provider notes from PT order:  "Overall deconditioning 2/2 immobility and obesity.  She has lost a considerable amount of weight and is able to move much better but distances > 100 feet are still very fatiguing."  PERTINENT HISTORY:  Patient is a 67 y.o. female who presents to outpatient physical therapy with a referral for medical diagnosis chronic low back pain, unspecified back pain laterality, unspecified whether sciatica present, physical deconditioning,  morbid obesity, left hip pain. This patient's chief complaints consist of ***, leading to the following functional deficits: ***. Patient has Chronic airway obstruction (HCC); Acute cough; Avitaminosis D; Adiposity; Hyperlipidemia associated with type 2 diabetes mellitus (HCC); Diabetes mellitus, type 2 (HCC); Hypertension associated with diabetes (HCC); Vertigo; Axillary hidradenitis suppurativa; OSA (obstructive sleep apnea); Screening for breast cancer; Fall; Acute pain of right knee; History of right knee surgery; Urinary urgency; Strain of lumbar region; Intertrigo; Vitamin D deficiency; Acute right-sided low back pain without sciatica; and Drug-induced constipation on their problem list. Patient  has a past medical history of Asthma, Brain aneurysm (04/2007), Diabetes mellitus without complication (HCC), HLD (hyperlipidemia), Hypertension, Stroke (HCC), and Vertigo. Patient  has a past surgical history that includes Tubal ligation; Tonsillectomy and adenoidectomy; Craniotomy (04/28/2007); Knee surgery; Tonsillectomy; and Colonoscopy with propofol (N/A, 01/19/2022). Patient denies hx of {redflags:27294}  PAIN:  Are you having pain? Yes NPRS: Current: ***/10,  Best: ***/10, Worst:  ***/10. Pain location: *** Pain description: *** Aggravating factors: *** Relieving factors: ***   FUNCTIONAL LIMITATIONS: ***  LEISURE: ***  PRECAUTIONS: {Therapy precautions:24002}  WEIGHT BEARING RESTRICTIONS: {Yes ***/No:24003}  FALLS:  Has patient fallen in last 6 months? {fallsyesno:27318}  LIVING ENVIRONMENT: Lives with: {[facility] lives with:25569::"lives with their family"} Lives in: {Lives in:25570} Stairs: {opstairs:27293} Has following equipment at home: {Assistive devices:23999}  OCCUPATION: ***  PLOF: {PLOF:24004}  PATIENT GOALS: ***  NEXT MD VISIT: ***   OBJECTIVE  DIAGNOSTIC FINDINGS:  Lumbar xray report from 09/01/2021:  CLINICAL DATA:  Low back pain; urinary urgency-frequency. Lower back pain radiates down left knee for 2 weeks.   EXAM: LUMBAR SPINE - COMPLETE 4+ VIEW   COMPARISON:  X-ray lumbar 04/03/2017.   FINDINGS: Normal lumbar alignment.  Negative for fracture or mass.   Mild disc degeneration in the lumbar spine. Diffuse facet arthropathy. No pars defect   Atherosclerotic calcification aorta without dilatation   IMPRESSION: Multilevel disc and facet degeneration. No acute abnormality and no interval change from the prior study     Electronically Signed   By: Marlan Palau M.D.   On: 09/02/2021 08:44  Left hip xray report from 08/15/2022:  CLINICAL DATA:  Hip pain, pain while bending over   EXAM: DG HIP (WITH OR WITHOUT PELVIS) 2-3V LEFT   COMPARISON:  04/03/2017   FINDINGS: No displaced fracture or dislocation of the left hip. Mild superior joint space loss and acetabular osteophytosis, symmetric to the right hip seen in single frontal view only. Heterotopic ossification about the right greater than left iliac crests. Nonobstructive pattern of overlying bowel gas.   IMPRESSION: No displaced fracture or dislocation of the left hip. Mild superior joint space loss and acetabular osteophytosis.     Electronically Signed    By: Jearld Lesch M.D.   On: 08/15/2021 13:45  SELF- REPORTED FUNCTION FOTO score: ***/100 (NOC-Neuromuscular disorder questionnaire)  OBSERVATION/INSPECTION Posture Posture (seated): forward head, rounded shoulders, slumped in sitting.  Posture (standing): *** Posture correction: *** Anthropometrics Tremor: none Body composition: *** Muscle bulk: *** Skin: The incision sites appear to be healing well with no excessive redness, warmth, drainage or signs of infection present.  *** Edema: *** Functional Mobility Bed mobility: *** Transfers: *** Gait: grossly WFL for household and short community ambulation. More detailed gait analysis deferred to later date as needed. *** Stairs: ***  SPINE MOTION  LUMBAR SPINE AROM *Indicates pain Flexion: *** Extension: *** Side Flexion:   R ***  L *** Rotation:  R ***  L *** Side glide:  R *** L ***   NEUROLOGICAL  Upper Motor Neuron Screen Babinski, Hoffman's and Clonus (ankle) negative bilaterally.  Dermatomes C2-T1 appears equal and intact to light touch except the following: *** L2-S2 appears equal and intact to light touch except the following: *** Deep Tendon Reflexes R/L  ***+/***+ Biceps brachii reflex (C5, C6) ***+/***+ Brachioradialis reflex (C6) ***+/***+ Triceps brachii reflex (C7) ***+/***+ Quadriceps reflex (L4) ***+/***+ Achilles reflex (S1)  SPINE MOTION  CERVICAL SPINE AROM *Indicates pain Flexion: *** Extension: *** Side Flexion:   R ***  L *** Rotation:  R *** L ***   PERIPHERAL JOINT MOTION (in degrees)  ACTIVE RANGE OF MOTION (AROM) *Indicates pain Date Date Date  Joint/Motion R/L R/L R/L  Shoulder     Flexion / / /  Extension / / /  Abduction  / / /  External rotation / / /  Internal rotation / / /  Elbow     Flexion  / / /  Extension  / / /  Wrist     Flexion / / /  Extension  / / /  Radial deviation / / /  Ulnar deviation / / /  Pronation / / /  Supination / / /  Hip      Flexion / / /  Extension  / / /  Abduction / / /  Adduction / / /  External rotation / / /  Internal rotation  / / /  Knee     Extension / / /  Flexoin / / /  Ankle/Foot     Dorsiflexion (knee ext) / / /  Dorsiflexion (knee flex) / / /  Plantarflexion / / /  Everison / / /  Inversion / / /  Great toe extension / / /  Great toe flexion / / /  Comments:   PASSIVE RANGE OF MOTION (PROM) *Indicates pain Date Date Date  Joint/Motion R/L R/L R/L  Shoulder     Flexion / / /  Extension / / /  Abduction  / / /  External rotation / / /  Internal rotation / / /  Elbow     Flexion  / / /  Extension  / / /  Wrist     Flexion / / /  Extension  / / /  Radial deviation / / /  Ulnar deviation / / /  Pronation / / /  Supination / / /  Hip     Flexion  / / /  Extension  / / /  Abduction / / /  Adduction / / /  External rotation / / /  Internal rotation  / / /  Knee     Extension / / /  Flexion / / /  Ankle/Foot     Dorsiflexion (knee ext) / / /  Dorsiflexion (knee flex) / / /  Plantarflexion / / /  Everison / / /  Inversion / / /  Great toe extension / / /  Great toe flexion / / /  Comments:   MUSCLE PERFORMANCE (MMT):  *Indicates pain Date Date Date  Joint/Motion R/L R/L R/L  Shoulder     Flexion / / /  Abduction (C5) / / /  External rotation / / /  Internal rotation / / /  Extension / / /  Elbow     Flexion (C6) / / /  Extension (C7) / / /  Wrist  Flexion (C7) / / /  Extension (C6) / / /  Radial deviation / / /  Ulnar deviation (C8) / / /  Pronation / / /  Supination / / /  Hand     Thumb extension (C8) / / /  Finger abduction (T1) / / /  Grip (C8) / / /  Hip     Flexion (L1, L2) / / /  Extension (knee ext) / / /  Extension (knee flex) / / /  Abduction / / /  Adduction / / /  External rotation / / /  Internal rotation  / / /  Knee     Extension (L3) / / /  Flexion (S2) / / /  Ankle/Foot     Dorsiflexion (L4) / / /  Great toe extension  (L5) / / /  Eversion (S1) / / /  Plantarflexion (S1) / / /  Inversion / / /  Pronation / / /  Great toe flexion / / /  Comments:   SPECIAL TESTS:  .Neurodynamictests .NeurodynamicUE .NeurodynamicLE .CspineInstability .CSPINESPECIALTESTS .SHOULDERSPECIALTESTCLUSTERS .HIPSPECIALTESTS .SIJSPECIALTESTS   SHOULDER SPECIAL TESTS RTC, Impingement, Anterior Instability (macrotrauma), Labral Tear: Painful arc test: R = ***, L = ***. Drop arm test: R = ***, L = ***. Hawkins-Kennedy test: R = ***, L = ***. Infraspinatus test: R = ***, L = ***. Apprehension test: R = ***, L = ***. Relocation test: R = ***, L = ***. Active compression test: R = ***, L = ***.  ACCESSORY MOTION: ***  PALPATION: ***  SUSTAINED POSITIONS TESTING:  ***  REPEATED MOTIONS TESTING: ***  FUNCTIONAL/BALANCE TESTS: Five Time Sit to Stand (5TSTS): *** seconds Functional Gait Assessment (FGA): ***/30 (see details above) Ten meter walking trial ( ): *** m/s Six Minute Walk Test ( ): *** feet Timed Up and Go (TUG): *** seconds   Dynamic Gait Index: ***/24 BERG Balance Scale: ***/56 Tinetti/POMA: ***/28 Timed Up and GO: *** seconds (average of 3 trials) Trial 1: *** Trial 2: *** Trial 3: *** Romberg test: -Narrow stance, eyes open: *** seconds -Narrow stance, eyes closed: *** seconds Sharpened Romberg test: -Tandem stance, eyes open: *** seconds -Tandem stance, eyes closed: *** seconds  Narrow stance, firm surface, eyes open: *** seconds Narrow stance, firm surface, eyes closed: *** seconds Narrow stance, compliant surface, eyes open: *** seconds Narrow stance, compliant surface, eyes closed: *** seconds Single leg stance, firm surface, eyes open: R= *** seconds, L= *** seconds Single leg stance, compliant surface, eyes open: R= *** seconds, L= *** seconds Gait speed: *** m/s Functional reach test: *** inches      TODAY'S TREATMENT:       PATIENT EDUCATION:  Education  details: *** Person educated: {Person educated:25204} Education method: {Education Method:25205} Education comprehension: {Education Comprehension:25206}  HOME EXERCISE PROGRAM: ***  ASSESSMENT:  CLINICAL IMPRESSION: Patient is a 67 y.o. female referred to outpatient physical therapy with a medical diagnosis of chronic low back pain, unspecified back pain laterality, unspecified whether sciatica present, physical deconditioning, morbid obesity, left hip pain who presents with signs and symptoms consistent with ***. Patient presents with significant *** impairments that are limiting ability to complete *** without difficulty. Patient will benefit from skilled physical therapy intervention to address current body structure impairments and activity limitations to improve function and work towards goals set in current POC in order to return to prior level of function or maximal functional improvement.     OBJECTIVE IMPAIRMENTS: {opptimpairments:25111}.   ACTIVITY LIMITATIONS: {activitylimitations:27494}  PARTICIPATION LIMITATIONS: {participationrestrictions:25113}  PERSONAL FACTORS: {Personal factors:25162} are also affecting patient's functional outcome.   REHAB POTENTIAL: {rehabpotential:25112}  CLINICAL DECISION MAKING: {clinical decision making:25114}  EVALUATION COMPLEXITY: {Evaluation complexity:25115}   GOALS: Goals reviewed with patient? {yes/no:20286}  SHORT TERM GOALS: Target date: 01/19/2023  Patient will be independent with initial home exercise program for self-management of symptoms. Baseline: {HEPbaseline4:27310} (01/05/23); Goal status: INITIAL   LONG TERM GOALS: Target date: 03/30/2023  Patient will be independent with a long-term home exercise program for self-management of symptoms.  Baseline: {HEPbaseline4:27310} (01/05/23); Goal status: INITIAL  2.  Patient will demonstrate improved FOTO to equal or greater than *** by visit #*** to demonstrate improvement  in overall condition and self-reported functional ability.  Baseline: *** (01/05/23); Goal status: INITIAL  3.  *** Baseline: *** (01/05/23); Goal status: INITIAL  4.  *** Baseline: *** (01/05/23); Goal status: INITIAL  5.  Patient will complete community, work and/or recreational activities without limitation due to current condition.  Baseline: *** (01/05/23); Goal status: INITIAL  6.  *** Baseline: *** Goal status: INITIAL   PLAN:  PT FREQUENCY: {rehab frequency:25116}  PT DURATION: {rehab duration:25117}  PLANNED INTERVENTIONS: {rehab planned interventions:25118::"Therapeutic exercises","Therapeutic activity","Neuromuscular re-education","Balance training","Gait training","Patient/Family education","Self Care","Joint mobilization"}.  PLAN FOR NEXT SESSION: ***   Cira Rue, PT, DPT 01/05/2023, 5:08 PM   [facility] [address] [place], [place] [address] P: [phone] I F: [phone]

## 2023-01-08 ENCOUNTER — Other Ambulatory Visit: Payer: Self-pay | Admitting: Physician Assistant

## 2023-01-08 DIAGNOSIS — E7800 Pure hypercholesterolemia, unspecified: Secondary | ICD-10-CM

## 2023-01-09 ENCOUNTER — Ambulatory Visit: Payer: 59

## 2023-01-09 VITALS — Ht 62.0 in | Wt 220.0 lb

## 2023-01-09 DIAGNOSIS — Z0000 Encounter for general adult medical examination without abnormal findings: Secondary | ICD-10-CM | POA: Diagnosis not present

## 2023-01-09 NOTE — Progress Notes (Signed)
Subjective:   Kristin Mora is a 67 y.o. female who presents for Medicare Annual (Subsequent) preventive examination.  Visit Complete: Virtual  I connected with  Barbette Reichmann on 01/09/23 by a audio enabled telemedicine application and verified that I am speaking with the correct person using two identifiers.  Patient Location: Home  Provider Location: Office/Clinic  I discussed the limitations of evaluation and management by telemedicine. The patient expressed understanding and agreed to proceed.  Patient Medicare AWV questionnaire was completed by the patient on (not done); I have confirmed that all information answered by patient is correct and no changes since this date.  Review of Systems    Cardiac Risk Factors include: advanced age (>8men, >77 women);diabetes mellitus;hypertension;obesity (BMI >30kg/m2);sedentary lifestyle    Objective:    Today's Vitals   01/09/23 1335  Weight: 220 lb (99.8 kg)  Height: 5\' 2"  (1.575 m)   Body mass index is 40.24 kg/m.     01/09/2023    1:46 PM 01/19/2022    9:38 AM 08/15/2021   12:45 PM 06/30/2020    3:26 PM 04/03/2017    1:07 AM 01/24/2017    5:50 PM 07/10/2016    2:15 PM  Advanced Directives  Does Patient Have a Medical Advance Directive? No No No No No No No  Would patient like information on creating a medical advance directive?  No - Patient declined  No - Patient declined No - Patient declined No - Patient declined No - Patient declined    Current Medications (verified) Outpatient Encounter Medications as of 01/09/2023  Medication Sig   amLODipine (NORVASC) 10 MG tablet Take 1 tablet by mouth once daily   atorvastatin (LIPITOR) 80 MG tablet Take 1 tablet by mouth once daily   Blood Pressure Monitoring (BLOOD PRESSURE DIGITAL SOLN) KIT Dispense one automatic/digital blood pressure machine; check blood pressure twice daily in AM and PM   hydrocortisone 1 % lotion Apply 1 Application topically 2 (two) times daily. For itching    ketoconazole (NIZORAL) 2 % cream Apply 1 Application topically daily. On R armpit, under R breast   lactulose (CHRONULAC) 10 GM/15ML solution Take 15 mLs (10 g total) by mouth 2 (two) times daily as needed for severe constipation.   lidocaine (LIDODERM) 5 % Place 1 patch onto the skin daily. Remove & Discard patch within 12 hours or as directed by MD   losartan-hydrochlorothiazide (HYZAAR) 100-25 MG tablet TAKE 1 TABLET BY MOUTH ONCE DAILY . APPOINTMENT REQUIRED FOR FUTURE REFILLS   metFORMIN (GLUCOPHAGE) 500 MG tablet TAKE 1 TABLET BY MOUTH TWICE DAILY WITH A MEAL   Miconazole-Zinc Oxide-Petrolat 0.25-15-81.35 % OINT Apply to affected skin twice a day until clear   oxybutynin (DITROPAN) 5 MG tablet Take 1 tablet (5 mg total) by mouth 2 (two) times daily.   OZEMPIC, 2 MG/DOSE, 8 MG/3ML SOPN INJECT 1 DOSE (2MG ) SUBCUTANEOUSLY ONCE A WEEK   terbinafine (LAMISIL) 1 % cream Apply 1 Application topically 2 (two) times daily. To affected areas of rash under breast and under arms   triamcinolone (KENALOG) 0.025 % ointment Apply 1 Application topically 2 (two) times daily.   Vitamin D, Ergocalciferol, (DRISDOL) 1.25 MG (50000 UNIT) CAPS capsule Take 1 capsule by mouth once a week   No facility-administered encounter medications on file as of 01/09/2023.    Allergies (verified) Lisinopril and Latex   History: Past Medical History:  Diagnosis Date   Asthma    Brain aneurysm 04/2007   Diabetes mellitus  without complication (HCC)    controlled;   HLD (hyperlipidemia)    Hypertension    controlled with medication   Stroke [facility])    Vertigo    Past Surgical History:  Procedure Laterality Date   COLONOSCOPY WITH PROPOFOL N/A 01/19/2022   Procedure: COLONOSCOPY WITH PROPOFOL;  Surgeon: Wyline Mood, MD;  Location: [facility] ENDOSCOPY;  Service: Gastroenterology;  Laterality: N/A;   CRANIOTOMY  04/28/2007   KNEE SURGERY     TONSILLECTOMY     TONSILLECTOMY AND ADENOIDECTOMY     TUBAL LIGATION      Family History  Problem Relation Age of Onset   Heart attack Mother    Coronary artery disease Mother    Lung cancer Father    Diabetes Sister    Heart Problems Sister    Uterine cancer Sister    Lung cancer Sister    Stroke Brother    Bone cancer Brother    Social History   Socioeconomic History   Marital status: Legally Separated    Spouse name: Not on file   Number of children: 2   Years of education: Not on file   Highest education level: High school graduate  Occupational History   Occupation: disabled  Tobacco Use   Smoking status: Former    Current packs/day: 0.00    Average packs/day: 2.0 packs/day for 12.0 years (24.0 ttl pk-yrs)    Types: Cigarettes    Start date: 11/25/1997    Quit date: 11/25/2009    Years since quitting: 13.1   Smokeless tobacco: Never  Vaping Use   Vaping status: Never Used  Substance and Sexual Activity   Alcohol use: No    Alcohol/week: 0.0 standard drinks of alcohol   Drug use: No   Sexual activity: Not on file  Other Topics Concern   Not on file  Social History Narrative   Not on file   Social Determinants of Health   Financial Resource Strain: Low Risk  (01/09/2023)   Overall Financial Resource Strain (CARDIA)    Difficulty of Paying Living Expenses: Not hard at all  Food Insecurity: No Food Insecurity (06/30/2020)   Hunger Vital Sign    Worried About Running Out of Food in the Last Year: Never true    Ran Out of Food in the Last Year: Never true  Transportation Needs: No Transportation Needs (01/09/2023)   PRAPARE - Administrator, Civil Service (Medical): No    Lack of Transportation (Non-Medical): No  Physical Activity: Inactive (01/09/2023)   Exercise Vital Sign    Days of Exercise per Week: 0 days    Minutes of Exercise per Session: 0 min  Stress: No Stress Concern Present (01/09/2023)   [employer] of Occupational Health - Occupational Stress Questionnaire    Feeling of Stress : Not at all  Social  Connections: Moderately Isolated (01/09/2023)   Social Connection and Isolation Panel [NHANES]    Frequency of Communication with Friends and Family: More than three times a week    Frequency of Social Gatherings with Friends and Family: More than three times a week    Attends Religious Services: More than 4 times per year    Active Member of [employer] or Organizations: No    Attends Banker Meetings: Never    Marital Status: Separated    Tobacco Counseling Counseling given: Not Answered   Clinical Intake:  Pre-visit preparation completed: Yes  Pain : No/denies pain     BMI - recorded:  40.24 Nutritional Status: BMI > 30  Obese Nutritional Risks: None Diabetes: Yes CBG done?: No Did pt. bring in CBG monitor from home?: No  How often do you need to have someone help you when you read instructions, pamphlets, or other written materials from your doctor or pharmacy?: 1 - Never  Interpreter Needed?: No  Comments: daughter lives with her Information entered by :: B.Lajean Boese,LPN   Activities of Daily Living    01/09/2023    1:46 PM 12/12/2022    4:14 PM  In your present state of health, do you have any difficulty performing the following activities:  Hearing? 0 0  Vision? 0 0  Difficulty concentrating or making decisions? 0 0  Walking or climbing stairs? 0 0  Dressing or bathing? 0 0  Doing errands, shopping? 0 0  Preparing Food and eating ? N   Using the Toilet? N   In the past six months, have you accidently leaked urine? N   Do you have problems with loss of bowel control? N   Managing your Medications? N   Managing your Finances? N   Housekeeping or managing your Housekeeping? Y     Patient Care Team: Alfredia Ferguson, PA-C as PCP - General (Physician Assistant) Debbe Odea, MD as PCP - Cardiology (Cardiology) Domingo Madeira, OD (Optometry)  Indicate any recent Medical Services you may have received from other than [facility] providers in the past year  (date may be approximate).     Assessment:   This is a routine wellness examination for Kellyanne.  Hearing/Vision screen Hearing Screening - Comments:: Adequate hearing Vision Screening - Comments:: Adequate vision w/glasses Dr Nice&gt; Eye  Dietary issues and exercise activities discussed:     Goals Addressed             This Visit's Progress    Exercise 3x per week (30 min per time)   Not on track    Recommend to exercise for 3 days a week for at least 30 minutes at a time.       Depression Screen    01/09/2023    1:43 PM 12/12/2022    4:14 PM 10/21/2022    4:11 PM 08/17/2022    3:44 PM 07/08/2022    2:49 PM 04/01/2022    4:03 PM 08/25/2021    2:46 PM  PHQ 2/9 Scores  PHQ - 2 Score 0 0 0 0 0 0 0  PHQ- 9 Score  0 1 0 0 6 3    Fall Risk    01/09/2023    1:42 PM 12/12/2022    4:14 PM 11/04/2022    4:31 PM 11/04/2022    4:13 PM 10/21/2022    4:10 PM  Fall Risk   Falls in the past year? 0 0 0 0 0  Comment   Pt has not fallen but is a high fall risk 2/2 gait and balance    Number falls in past yr: 0 0 0 0 0  Injury with Fall? 0 0 0 0 0  Risk for fall due to : No Fall Risks No Fall Risks Impaired balance/gait;Impaired mobility;Orthopedic patient  Impaired balance/gait;Impaired mobility  Follow up Education provided;Falls prevention discussed Falls evaluation completed       MEDICARE RISK AT HOME:   TIMED UP AND GO:  Was the test performed?  No    Cognitive Function:        01/09/2023    1:49 PM  6CIT Screen  What Year? 0  points  What month? 0 points  What time? 0 points  Count back from 20 0 points  Months in reverse 4 points  Repeat phrase 2 points  Total Score 6 points    Immunizations Immunization History  Administered Date(s) Administered   Fluad Quad(high Dose 65+) 03/27/2019, 04/10/2020, 04/27/2021, 04/01/2022   Influenza,inj,Quad PF,6+ Mos 05/26/2015   Pneumococcal Conjugate-13 07/27/2020   Pneumococcal Polysaccharide-23 04/18/2012,  06/25/2019   Tdap 12/27/2010    TDAP status: Up to date  Flu Vaccine status: Up to date  Pneumococcal vaccine status: Up to date  Covid-19 vaccine status: Declined, Education has been provided regarding the importance of this vaccine but patient still declined. Advised may receive this vaccine at local pharmacy or Health Dept.or vaccine clinic. Aware to provide a copy of the vaccination record if obtained from local pharmacy or Health Dept. Verbalized acceptance and understanding.  Qualifies for Shingles Vaccine? Yes   Zostavax completed No   Shingrix Completed?: No.    Education has been provided regarding the importance of this vaccine. Patient has been advised to call insurance company to determine out of pocket expense if they have not yet received this vaccine. Advised may also receive vaccine at local pharmacy or Health Dept. Verbalized acceptance and understanding.  Screening Tests Health Maintenance  Topic Date Due   Zoster Vaccines- Shingrix (1 of 2) Never done   OPHTHALMOLOGY EXAM  09/13/2012   DEXA SCAN  Never done   DTaP/Tdap/Td (2 - Td or Tdap) 12/26/2020   MAMMOGRAM  01/22/2023   INFLUENZA VACCINE  01/26/2023   FOOT EXAM  04/02/2023   HEMOGLOBIN A1C  04/22/2023   Diabetic kidney evaluation - eGFR measurement  07/09/2023   Diabetic kidney evaluation - Urine ACR  10/21/2023   Medicare Annual Wellness (AWV)  01/09/2024   Colonoscopy  01/19/2025   Pneumonia Vaccine 44+ Years old  Completed   Hepatitis C Screening  Completed   HPV VACCINES  Aged Out   Lung Cancer Screening  Discontinued   COVID-19 Vaccine  Discontinued    Health Maintenance  Health Maintenance Due  Topic Date Due   Zoster Vaccines- Shingrix (1 of 2) Never done   OPHTHALMOLOGY EXAM  09/13/2012   DEXA SCAN  Never done   DTaP/Tdap/Td (2 - Td or Tdap) 12/26/2020    Colorectal cancer screening: Type of screening: Colonoscopy. Completed yes. Repeat every 5 years  Mammogram status: Ordered yes. Pt  provided with contact info and advised to call to schedule appt.   Bone Density status: Ordered yes. Pt provided with contact info and advised to call to schedule appt.  Lung Cancer Screening: (Low Dose CT Chest recommended if Age 92-80 years, 20 pack-year currently smoking OR have quit w/in 15years.) does not qualify.   Lung Cancer Screening Referral: no  Additional Screening:  Hepatitis C Screening: does not qualify; Completed yes  Vision Screening: Recommended annual ophthalmology exams for early detection of glaucoma and other disorders of the eye. Is the patient up to date with their annual eye exam?  Yes  Who is the provider or what is the name of the office in which the patient attends annual eye exams? [facility] If pt is not established with a provider, would they like to be referred to a provider to establish care? No .   Dental Screening: Recommended annual dental exams for proper oral hygiene  Diabetic Foot Exam: Diabetic Foot Exam: Completed yes  Community Resource Referral / Chronic Care Management: CRR required this  visit?  No   CCM required this visit?  No     Plan:     I have personally reviewed and noted the following in the patient's chart:   Medical and social history Use of alcohol, tobacco or illicit drugs  Current medications and supplements including opioid prescriptions. Patient is not currently taking opioid prescriptions. Functional ability and status Nutritional status Physical activity Advanced directives List of other physicians Hospitalizations, surgeries, and ER visits in previous 12 months Vitals Screenings to include cognitive, depression, and falls Referrals and appointments  In addition, I have reviewed and discussed with patient certain preventive protocols, quality metrics, and best practice recommendations. A written personalized care plan for preventive services as well as general preventive health recommendations were provided  to patient.     Sue Lush, LPN   [id number]/[id number]   After Visit Summary: (MyChart) Due to this being a telephonic visit, the after visit summary with patients personalized plan was offered to patient via MyChart   Nurse Notes: The patient states they are doing well and has no concerns or questions at this time.

## 2023-01-09 NOTE — Patient Instructions (Signed)
Kristin Mora , Thank you for taking time to come for your Medicare Wellness Visit. I appreciate your ongoing commitment to your health goals. Please review the following plan we discussed and let me know if I can assist you in the future.   These are the goals we discussed:  Goals      Exercise 3x per week (30 min per time)     Recommend to exercise for 3 days a week for at least 30 minutes at a time.         This is a list of the screening recommended for you and due dates:  Health Maintenance  Topic Date Due   Zoster (Shingles) Vaccine (1 of 2) Never done   Eye exam for diabetics  09/13/2012   DEXA scan (bone density measurement)  Never done   DTaP/Tdap/Td vaccine (2 - Td or Tdap) 12/26/2020   Mammogram  01/22/2023   Flu Shot  01/26/2023   Complete foot exam   04/02/2023   Hemoglobin A1C  04/22/2023   Yearly kidney function blood test for diabetes  07/09/2023   Yearly kidney health urinalysis for diabetes  10/21/2023   Medicare Annual Wellness Visit  01/09/2024   Colon Cancer Screening  01/19/2025   Pneumonia Vaccine  Completed   Hepatitis C Screening  Completed   HPV Vaccine  Aged Out   Screening for Lung Cancer  Discontinued   COVID-19 Vaccine  Discontinued    Advanced directives: no  Conditions/risks identified: low falls risk  Next appointment: Follow up in one year for your annual wellness visit 01/10/2024 @ 3:00pm telephone   Preventive Care 65 Years and Older, Female Preventive care refers to lifestyle choices and visits with your health care provider that can promote health and wellness. What does preventive care include? A yearly physical exam. This is also called an annual well check. Dental exams once or twice a year. Routine eye exams. Ask your health care provider how often you should have your eyes checked. Personal lifestyle choices, including: Daily care of your teeth and gums. Regular physical activity. Eating a healthy diet. Avoiding tobacco and  drug use. Limiting alcohol use. Practicing safe sex. Taking low-dose aspirin every day. Taking vitamin and mineral supplements as recommended by your health care provider. What happens during an annual well check? The services and screenings done by your health care provider during your annual well check will depend on your age, overall health, lifestyle risk factors, and family history of disease. Counseling  Your health care provider may ask you questions about your: Alcohol use. Tobacco use. Drug use. Emotional well-being. Home and relationship well-being. Sexual activity. Eating habits. History of falls. Memory and ability to understand (cognition). Work and work Astronomer. Reproductive health. Screening  You may have the following tests or measurements: Height, weight, and BMI. Blood pressure. Lipid and cholesterol levels. These may be checked every 5 years, or more frequently if you are over 33 years old. Skin check. Lung cancer screening. You may have this screening every year starting at age 56 if you have a 30-pack-year history of smoking and currently smoke or have quit within the past 15 years. Fecal occult blood test (FOBT) of the stool. You may have this test every year starting at age 39. Flexible sigmoidoscopy or colonoscopy. You may have a sigmoidoscopy every 5 years or a colonoscopy every 10 years starting at age 24. Hepatitis C blood test. Hepatitis B blood test. Sexually transmitted disease (STD) testing. Diabetes screening. This  is done by checking your blood sugar (glucose) after you have not eaten for a while (fasting). You may have this done every 1-3 years. Bone density scan. This is done to screen for osteoporosis. You may have this done starting at age 28. Mammogram. This may be done every 1-2 years. Talk to your health care provider about how often you should have regular mammograms. Talk with your health care provider about your test results, treatment  options, and if necessary, the need for more tests. Vaccines  Your health care provider may recommend certain vaccines, such as: Influenza vaccine. This is recommended every year. Tetanus, diphtheria, and acellular pertussis (Tdap, Td) vaccine. You may need a Td booster every 10 years. Zoster vaccine. You may need this after age 81. Pneumococcal 13-valent conjugate (PCV13) vaccine. One dose is recommended after age 23. Pneumococcal polysaccharide (PPSV23) vaccine. One dose is recommended after age 64. Talk to your health care provider about which screenings and vaccines you need and how often you need them. This information is not intended to replace advice given to you by your health care provider. Make sure you discuss any questions you have with your health care provider. Document Released: 07/10/2015 Document Revised: 03/02/2016 Document Reviewed: 04/14/2015 Elsevier Interactive Patient Education  2017 [employer].  Fall Prevention in the Home Falls can cause injuries. They can happen to people of all ages. There are many things you can do to make your home safe and to help prevent falls. What can I do on the outside of my home? Regularly fix the edges of walkways and driveways and fix any cracks. Remove anything that might make you trip as you walk through a door, such as a raised step or threshold. Trim any bushes or trees on the path to your home. Use bright outdoor lighting. Clear any walking paths of anything that might make someone trip, such as rocks or tools. Regularly check to see if handrails are loose or broken. Make sure that both sides of any steps have handrails. Any raised decks and porches should have guardrails on the edges. Have any leaves, snow, or ice cleared regularly. Use sand or salt on walking paths during winter. Clean up any spills in your garage right away. This includes oil or grease spills. What can I do in the bathroom? Use night lights. Install grab  bars by the toilet and in the tub and shower. Do not use towel bars as grab bars. Use non-skid mats or decals in the tub or shower. If you need to sit down in the shower, use a plastic, non-slip stool. Keep the floor dry. Clean up any water that spills on the floor as soon as it happens. Remove soap buildup in the tub or shower regularly. Attach bath mats securely with double-sided non-slip rug tape. Do not have throw rugs and other things on the floor that can make you trip. What can I do in the bedroom? Use night lights. Make sure that you have a light by your bed that is easy to reach. Do not use any sheets or blankets that are too big for your bed. They should not hang down onto the floor. Have a firm chair that has side arms. You can use this for support while you get dressed. Do not have throw rugs and other things on the floor that can make you trip. What can I do in the kitchen? Clean up any spills right away. Avoid walking on wet floors. Keep items that  you use a lot in easy-to-reach places. If you need to reach something above you, use a strong step stool that has a grab bar. Keep electrical cords out of the way. Do not use floor polish or wax that makes floors slippery. If you must use wax, use non-skid floor wax. Do not have throw rugs and other things on the floor that can make you trip. What can I do with my stairs? Do not leave any items on the stairs. Make sure that there are handrails on both sides of the stairs and use them. Fix handrails that are broken or loose. Make sure that handrails are as long as the stairways. Check any carpeting to make sure that it is firmly attached to the stairs. Fix any carpet that is loose or worn. Avoid having throw rugs at the top or bottom of the stairs. If you do have throw rugs, attach them to the floor with carpet tape. Make sure that you have a light switch at the top of the stairs and the bottom of the stairs. If you do not have them,  ask someone to add them for you. What else can I do to help prevent falls? Wear shoes that: Do not have high heels. Have rubber bottoms. Are comfortable and fit you well. Are closed at the toe. Do not wear sandals. If you use a stepladder: Make sure that it is fully opened. Do not climb a closed stepladder. Make sure that both sides of the stepladder are locked into place. Ask someone to hold it for you, if possible. Clearly mark and make sure that you can see: Any grab bars or handrails. First and last steps. Where the edge of each step is. Use tools that help you move around (mobility aids) if they are needed. These include: Canes. Walkers. Scooters. Crutches. Turn on the lights when you go into a dark area. Replace any light bulbs as soon as they burn out. Set up your furniture so you have a clear path. Avoid moving your furniture around. If any of your floors are uneven, fix them. If there are any pets around you, be aware of where they are. Review your medicines with your doctor. Some medicines can make you feel dizzy. This can increase your chance of falling. Ask your doctor what other things that you can do to help prevent falls. This information is not intended to replace advice given to you by your health care provider. Make sure you discuss any questions you have with your health care provider. Document Released: 04/09/2009 Document Revised: 11/19/2015 Document Reviewed: 07/18/2014 Elsevier Interactive Patient Education  2017 [employer].

## 2023-01-11 ENCOUNTER — Ambulatory Visit: Payer: 59 | Admitting: Physical Therapy

## 2023-01-11 NOTE — Therapy (Deleted)
OUTPATIENT PHYSICAL THERAPY EVALUATION   Patient Name: Kristin Mora MRN: [id number] DOB:11-24-1955, 67 y.o., female Today's Date: 01/11/2023  END OF SESSION:   Past Medical History:  Diagnosis Date   Asthma    Brain aneurysm 04/2007   Diabetes mellitus without complication (HCC)    controlled;   HLD (hyperlipidemia)    Hypertension    controlled with medication   Stroke [facility])    Vertigo    Past Surgical History:  Procedure Laterality Date   COLONOSCOPY WITH PROPOFOL N/A 01/19/2022   Procedure: COLONOSCOPY WITH PROPOFOL;  Surgeon: Wyline Mood, MD;  Location: [facility] ENDOSCOPY;  Service: Gastroenterology;  Laterality: N/A;   CRANIOTOMY  04/28/2007   KNEE SURGERY     TONSILLECTOMY     TONSILLECTOMY AND ADENOIDECTOMY     TUBAL LIGATION     Patient Active Problem List   Diagnosis Date Noted   Vitamin D deficiency 10/21/2022   Acute right-sided low back pain without sciatica 10/21/2022   Drug-induced constipation 10/21/2022   Intertrigo 11/30/2021   Urinary urgency 08/25/2021   Strain of lumbar region 08/25/2021   Fall 07/30/2020   Acute pain of right knee 07/30/2020   History of right knee surgery 07/30/2020   Screening for breast cancer 10/20/2017   OSA (obstructive sleep apnea) 07/27/2017   Axillary hidradenitis suppurativa 06/29/2016   Vertigo 09/14/2015   Chronic airway obstruction (HCC) 11/04/2014   Acute cough 11/04/2014   Avitaminosis D 11/04/2014   Adiposity 11/04/2014   Hyperlipidemia associated with type 2 diabetes mellitus (HCC) 11/04/2014   Diabetes mellitus, type 2 (HCC) 10/01/2014   Hypertension associated with diabetes (HCC) 10/01/2014    PCP: Alfredia Ferguson, PA-C  REFERRING PROVIDER: Alfredia Ferguson, PA-C  REFERRING DIAG: chronic low back pain, unspecified back pain laterality, unspecified whether sciatica present, physical deconditioning, morbid obesity, left hip pain  Rationale for Evaluation and Treatment: Rehabilitation  THERAPY DIAG:   No diagnosis found.  ONSET DATE: ***  SUBJECTIVE:                                                                                                                                                                                           SUBJECTIVE STATEMENT: ***  Provider notes from PT order:  "Overall deconditioning 2/2 immobility and obesity.  She has lost a considerable amount of weight and is able to move much better but distances > 100 feet are still very fatiguing."  PERTINENT HISTORY:  Patient is a 67 y.o. female who presents to outpatient physical therapy with a referral for medical diagnosis chronic low back pain, unspecified back pain laterality, unspecified whether sciatica present, physical deconditioning,  morbid obesity, left hip pain. This patient's chief complaints consist of ***, leading to the following functional deficits: ***. Patient has Chronic airway obstruction (HCC); Acute cough; Avitaminosis D; Adiposity; Hyperlipidemia associated with type 2 diabetes mellitus (HCC); Diabetes mellitus, type 2 (HCC); Hypertension associated with diabetes (HCC); Vertigo; Axillary hidradenitis suppurativa; OSA (obstructive sleep apnea); Screening for breast cancer; Fall; Acute pain of right knee; History of right knee surgery; Urinary urgency; Strain of lumbar region; Intertrigo; Vitamin D deficiency; Acute right-sided low back pain without sciatica; and Drug-induced constipation on their problem list. Patient  has a past medical history of Asthma, Brain aneurysm (04/2007), Diabetes mellitus without complication (HCC), HLD (hyperlipidemia), Hypertension, Stroke (HCC), and Vertigo. Patient  has a past surgical history that includes Tubal ligation; Tonsillectomy and adenoidectomy; Craniotomy (04/28/2007); Knee surgery; Tonsillectomy; and Colonoscopy with propofol (N/A, 01/19/2022). Patient denies hx of {redflags:27294}  PAIN:  Are you having pain? Yes NPRS: Current: ***/10,  Best: ***/10, Worst:  ***/10. Pain location: *** Pain description: *** Aggravating factors: *** Relieving factors: ***   FUNCTIONAL LIMITATIONS: ***  LEISURE: ***  PRECAUTIONS: {Therapy precautions:24002}  WEIGHT BEARING RESTRICTIONS: {Yes ***/No:24003}  FALLS:  Has patient fallen in last 6 months? {fallsyesno:27318}  LIVING ENVIRONMENT: Lives with: {[facility] lives with:25569::"lives with their family"} Lives in: {Lives in:25570} Stairs: {opstairs:27293} Has following equipment at home: {Assistive devices:23999}  OCCUPATION: ***  PLOF: {PLOF:24004}  PATIENT GOALS: ***  NEXT MD VISIT: ***   OBJECTIVE  DIAGNOSTIC FINDINGS:  Lumbar xray report from 09/01/2021:  CLINICAL DATA:  Low back pain; urinary urgency-frequency. Lower back pain radiates down left knee for 2 weeks.   EXAM: LUMBAR SPINE - COMPLETE 4+ VIEW   COMPARISON:  X-ray lumbar 04/03/2017.   FINDINGS: Normal lumbar alignment.  Negative for fracture or mass.   Mild disc degeneration in the lumbar spine. Diffuse facet arthropathy. No pars defect   Atherosclerotic calcification aorta without dilatation   IMPRESSION: Multilevel disc and facet degeneration. No acute abnormality and no interval change from the prior study     Electronically Signed   By: Marlan Palau M.D.   On: 09/02/2021 08:44  Left hip xray report from 08/15/2022:  CLINICAL DATA:  Hip pain, pain while bending over   EXAM: DG HIP (WITH OR WITHOUT PELVIS) 2-3V LEFT   COMPARISON:  04/03/2017   FINDINGS: No displaced fracture or dislocation of the left hip. Mild superior joint space loss and acetabular osteophytosis, symmetric to the right hip seen in single frontal view only. Heterotopic ossification about the right greater than left iliac crests. Nonobstructive pattern of overlying bowel gas.   IMPRESSION: No displaced fracture or dislocation of the left hip. Mild superior joint space loss and acetabular osteophytosis.     Electronically Signed    By: Jearld Lesch M.D.   On: 08/15/2021 13:45  SELF- REPORTED FUNCTION FOTO score: ***/100 (NOC-Neuromuscular disorder questionnaire)  OBSERVATION/INSPECTION Posture Posture (seated): forward head, rounded shoulders, slumped in sitting.  Posture (standing): *** Posture correction: *** Anthropometrics Tremor: none Body composition: *** Muscle bulk: *** Skin: The incision sites appear to be healing well with no excessive redness, warmth, drainage or signs of infection present.  *** Edema: *** Functional Mobility Bed mobility: *** Transfers: *** Gait: grossly WFL for household and short community ambulation. More detailed gait analysis deferred to later date as needed. *** Stairs: ***  SPINE MOTION  LUMBAR SPINE AROM *Indicates pain Flexion: *** Extension: *** Side Flexion:   R ***  L *** Rotation:  R ***  L *** Side glide:  R *** L ***   NEUROLOGICAL  Upper Motor Neuron Screen Babinski, Hoffman's and Clonus (ankle) negative bilaterally.  Dermatomes C2-T1 appears equal and intact to light touch except the following: *** L2-S2 appears equal and intact to light touch except the following: *** Deep Tendon Reflexes R/L  ***+/***+ Biceps brachii reflex (C5, C6) ***+/***+ Brachioradialis reflex (C6) ***+/***+ Triceps brachii reflex (C7) ***+/***+ Quadriceps reflex (L4) ***+/***+ Achilles reflex (S1)  SPINE MOTION  CERVICAL SPINE AROM *Indicates pain Flexion: *** Extension: *** Side Flexion:   R ***  L *** Rotation:  R *** L ***   PERIPHERAL JOINT MOTION (in degrees)  ACTIVE RANGE OF MOTION (AROM) *Indicates pain Date Date Date  Joint/Motion R/L R/L R/L  Shoulder     Flexion / / /  Extension / / /  Abduction  / / /  External rotation / / /  Internal rotation / / /  Elbow     Flexion  / / /  Extension  / / /  Wrist     Flexion / / /  Extension  / / /  Radial deviation / / /  Ulnar deviation / / /  Pronation / / /  Supination / / /  Hip      Flexion / / /  Extension  / / /  Abduction / / /  Adduction / / /  External rotation / / /  Internal rotation  / / /  Knee     Extension / / /  Flexoin / / /  Ankle/Foot     Dorsiflexion (knee ext) / / /  Dorsiflexion (knee flex) / / /  Plantarflexion / / /  Everison / / /  Inversion / / /  Great toe extension / / /  Great toe flexion / / /  Comments:   PASSIVE RANGE OF MOTION (PROM) *Indicates pain Date Date Date  Joint/Motion R/L R/L R/L  Shoulder     Flexion / / /  Extension / / /  Abduction  / / /  External rotation / / /  Internal rotation / / /  Elbow     Flexion  / / /  Extension  / / /  Wrist     Flexion / / /  Extension  / / /  Radial deviation / / /  Ulnar deviation / / /  Pronation / / /  Supination / / /  Hip     Flexion  / / /  Extension  / / /  Abduction / / /  Adduction / / /  External rotation / / /  Internal rotation  / / /  Knee     Extension / / /  Flexion / / /  Ankle/Foot     Dorsiflexion (knee ext) / / /  Dorsiflexion (knee flex) / / /  Plantarflexion / / /  Everison / / /  Inversion / / /  Great toe extension / / /  Great toe flexion / / /  Comments:   MUSCLE PERFORMANCE (MMT):  *Indicates pain Date Date Date  Joint/Motion R/L R/L R/L  Shoulder     Flexion / / /  Abduction (C5) / / /  External rotation / / /  Internal rotation / / /  Extension / / /  Elbow     Flexion (C6) / / /  Extension (C7) / / /  Wrist  Flexion (C7) / / /  Extension (C6) / / /  Radial deviation / / /  Ulnar deviation (C8) / / /  Pronation / / /  Supination / / /  Hand     Thumb extension (C8) / / /  Finger abduction (T1) / / /  Grip (C8) / / /  Hip     Flexion (L1, L2) / / /  Extension (knee ext) / / /  Extension (knee flex) / / /  Abduction / / /  Adduction / / /  External rotation / / /  Internal rotation  / / /  Knee     Extension (L3) / / /  Flexion (S2) / / /  Ankle/Foot     Dorsiflexion (L4) / / /  Great toe extension  (L5) / / /  Eversion (S1) / / /  Plantarflexion (S1) / / /  Inversion / / /  Pronation / / /  Great toe flexion / / /  Comments:   SPECIAL TESTS:  .Neurodynamictests .NeurodynamicUE .NeurodynamicLE .CspineInstability .CSPINESPECIALTESTS .SHOULDERSPECIALTESTCLUSTERS .HIPSPECIALTESTS .SIJSPECIALTESTS   SHOULDER SPECIAL TESTS RTC, Impingement, Anterior Instability (macrotrauma), Labral Tear: Painful arc test: R = ***, L = ***. Drop arm test: R = ***, L = ***. Hawkins-Kennedy test: R = ***, L = ***. Infraspinatus test: R = ***, L = ***. Apprehension test: R = ***, L = ***. Relocation test: R = ***, L = ***. Active compression test: R = ***, L = ***.  ACCESSORY MOTION: ***  PALPATION: ***  SUSTAINED POSITIONS TESTING:  ***  REPEATED MOTIONS TESTING: ***  FUNCTIONAL/BALANCE TESTS: Five Time Sit to Stand (5TSTS): *** seconds Functional Gait Assessment (FGA): ***/30 (see details above) Ten meter walking trial ( ): *** m/s Six Minute Walk Test ( ): *** feet Timed Up and Go (TUG): *** seconds   Dynamic Gait Index: ***/24 BERG Balance Scale: ***/56 Tinetti/POMA: ***/28 Timed Up and GO: *** seconds (average of 3 trials) Trial 1: *** Trial 2: *** Trial 3: *** Romberg test: -Narrow stance, eyes open: *** seconds -Narrow stance, eyes closed: *** seconds Sharpened Romberg test: -Tandem stance, eyes open: *** seconds -Tandem stance, eyes closed: *** seconds  Narrow stance, firm surface, eyes open: *** seconds Narrow stance, firm surface, eyes closed: *** seconds Narrow stance, compliant surface, eyes open: *** seconds Narrow stance, compliant surface, eyes closed: *** seconds Single leg stance, firm surface, eyes open: R= *** seconds, L= *** seconds Single leg stance, compliant surface, eyes open: R= *** seconds, L= *** seconds Gait speed: *** m/s Functional reach test: *** inches      TODAY'S TREATMENT:       PATIENT EDUCATION:  Education  details: *** Person educated: {Person educated:25204} Education method: {Education Method:25205} Education comprehension: {Education Comprehension:25206}  HOME EXERCISE PROGRAM: ***  ASSESSMENT:  CLINICAL IMPRESSION: Patient is a 67 y.o. female referred to outpatient physical therapy with a medical diagnosis of chronic low back pain, unspecified back pain laterality, unspecified whether sciatica present, physical deconditioning, morbid obesity, left hip pain who presents with signs and symptoms consistent with ***. Patient presents with significant *** impairments that are limiting ability to complete *** without difficulty. Patient will benefit from skilled physical therapy intervention to address current body structure impairments and activity limitations to improve function and work towards goals set in current POC in order to return to prior level of function or maximal functional improvement.     OBJECTIVE IMPAIRMENTS: {opptimpairments:25111}.   ACTIVITY LIMITATIONS: {activitylimitations:27494}  PARTICIPATION LIMITATIONS: {participationrestrictions:25113}  PERSONAL FACTORS: {Personal factors:25162} are also affecting patient's functional outcome.   REHAB POTENTIAL: {rehabpotential:25112}  CLINICAL DECISION MAKING: {clinical decision making:25114}  EVALUATION COMPLEXITY: {Evaluation complexity:25115}   GOALS: Goals reviewed with patient? {yes/no:20286}  SHORT TERM GOALS: Target date: 01/25/2023  Patient will be independent with initial home exercise program for self-management of symptoms. Baseline: {HEPbaseline4:27310} (01/11/23); Goal status: INITIAL   LONG TERM GOALS: Target date: 04/05/2023  Patient will be independent with a long-term home exercise program for self-management of symptoms.  Baseline: {HEPbaseline4:27310} (01/11/23); Goal status: INITIAL  2.  Patient will demonstrate improved FOTO to equal or greater than *** by visit #*** to demonstrate improvement  in overall condition and self-reported functional ability.  Baseline: *** (01/11/23); Goal status: INITIAL  3.  *** Baseline: *** (01/11/23); Goal status: INITIAL  4.  *** Baseline: *** (01/11/23); Goal status: INITIAL  5.  Patient will complete community, work and/or recreational activities without limitation due to current condition.  Baseline: *** (01/11/23); Goal status: INITIAL  6.  *** Baseline: *** Goal status: INITIAL   PLAN:  PT FREQUENCY: {rehab frequency:25116}  PT DURATION: {rehab duration:25117}  PLANNED INTERVENTIONS: {rehab planned interventions:25118::"Therapeutic exercises","Therapeutic activity","Neuromuscular re-education","Balance training","Gait training","Patient/Family education","Self Care","Joint mobilization"}.  PLAN FOR NEXT SESSION: ***   Cira Rue, PT, DPT 01/11/2023, 2:21 PM   [facility] [address] [place], [place] [address] P: [phone] I F: [phone]

## 2023-01-17 ENCOUNTER — Encounter: Payer: 59 | Admitting: Physical Therapy

## 2023-01-19 ENCOUNTER — Encounter: Payer: 59 | Admitting: Physical Therapy

## 2023-01-23 ENCOUNTER — Other Ambulatory Visit: Payer: Self-pay | Admitting: Physician Assistant

## 2023-01-23 DIAGNOSIS — E1159 Type 2 diabetes mellitus with other circulatory complications: Secondary | ICD-10-CM

## 2023-01-25 ENCOUNTER — Encounter: Payer: 59 | Admitting: Physical Therapy

## 2023-01-25 ENCOUNTER — Ambulatory Visit: Payer: 59 | Attending: Physician Assistant | Admitting: Physical Therapy

## 2023-01-30 ENCOUNTER — Ambulatory Visit: Payer: 59 | Admitting: Physical Therapy

## 2023-02-01 ENCOUNTER — Encounter: Payer: 59 | Admitting: Physical Therapy

## 2023-02-07 ENCOUNTER — Encounter: Payer: 59 | Admitting: Physical Therapy

## 2023-02-07 ENCOUNTER — Ambulatory Visit: Payer: 59 | Attending: Physician Assistant | Admitting: Physical Therapy

## 2023-02-07 DIAGNOSIS — M25552 Pain in left hip: Secondary | ICD-10-CM | POA: Insufficient documentation

## 2023-02-07 DIAGNOSIS — M545 Low back pain, unspecified: Secondary | ICD-10-CM | POA: Diagnosis not present

## 2023-02-07 DIAGNOSIS — R5381 Other malaise: Secondary | ICD-10-CM | POA: Insufficient documentation

## 2023-02-07 DIAGNOSIS — M5459 Other low back pain: Secondary | ICD-10-CM | POA: Diagnosis present

## 2023-02-07 DIAGNOSIS — G8929 Other chronic pain: Secondary | ICD-10-CM | POA: Diagnosis not present

## 2023-02-07 NOTE — Therapy (Signed)
OUTPATIENT PHYSICAL THERAPY THORACOLUMBAR EVALUATION   Patient Name: Kristin Mora MRN: [id number] DOB:1955/11/22, 67 y.o., female Today's Date: 02/07/2023  END OF SESSION:  PT End of Session - 02/07/23 1523     Visit Number 1    Number of Visits 20    Date for PT Re-Evaluation 04/18/23    Authorization Type [employer] Medicare    Authorization Time Period 02/07/23-04/18/23    Authorization - Visit Number 1    Authorization - Number of Visits 20    Progress Note Due on Visit 10    PT Start Time 1345    PT Stop Time 1430    PT Time Calculation (min) 45 min    Activity Tolerance Patient tolerated treatment well    Behavior During Therapy [facility] for tasks assessed/performed             Past Medical History:  Diagnosis Date   Asthma    Brain aneurysm 04/2007   Diabetes mellitus without complication (HCC)    controlled;   HLD (hyperlipidemia)    Hypertension    controlled with medication   Stroke [facility])    Vertigo    Past Surgical History:  Procedure Laterality Date   COLONOSCOPY WITH PROPOFOL N/A 01/19/2022   Procedure: COLONOSCOPY WITH PROPOFOL;  Surgeon: Wyline Mood, MD;  Location: [facility] ENDOSCOPY;  Service: Gastroenterology;  Laterality: N/A;   CRANIOTOMY  04/28/2007   KNEE SURGERY     TONSILLECTOMY     TONSILLECTOMY AND ADENOIDECTOMY     TUBAL LIGATION     Patient Active Problem List   Diagnosis Date Noted   Vitamin D deficiency 10/21/2022   Acute right-sided low back pain without sciatica 10/21/2022   Drug-induced constipation 10/21/2022   Intertrigo 11/30/2021   Urinary urgency 08/25/2021   Strain of lumbar region 08/25/2021   Fall 07/30/2020   Acute pain of right knee 07/30/2020   History of right knee surgery 07/30/2020   Screening for breast cancer 10/20/2017   OSA (obstructive sleep apnea) 07/27/2017   Axillary hidradenitis suppurativa 06/29/2016   Vertigo 09/14/2015   Chronic airway obstruction (HCC) 11/04/2014   Acute cough 11/04/2014   Avitaminosis D  11/04/2014   Adiposity 11/04/2014   Hyperlipidemia associated with type 2 diabetes mellitus (HCC) 11/04/2014   Diabetes mellitus, type 2 (HCC) 10/01/2014   Hypertension associated with diabetes (HCC) 10/01/2014    PCP: Dr. Alfredia Ferguson   REFERRING PROVIDER: Dr. Alfredia Ferguson   REFERRING DIAG: M54.50,G89.29 (ICD-10-CM) - Chronic low back pain, unspecified back pain laterality, unspecified whether sciatica present R53.81 (ICD-10-CM) - Physical deconditioning E66.01 (ICD-10-CM) - Morbid obesity (HCC) M25.552 (ICD-10-CM) - Left hip pain  Rationale for Evaluation and Treatment: Rehabilitation  THERAPY DIAG:  Other low back pain  ONSET DATE: Alfredia Ferguson PA-C  SUBJECTIVE:  SUBJECTIVE STATEMENT: See pertinent history   PERTINENT HISTORY:  Pt's pmh includes COPD and history of right sided stroke with resulting RLE paresis. Pt reports that she has sudden bouts of pain that occur without a specific pattern. She has recently lost 100 lbs and she has increased shortness of breath when exerting herself.   PAIN:  Are you having pain? Yes: NPRS scale: 7/10 Pain location: Lumbar spine that radiates to bilateral hips  Pain description: Achy  Aggravating factors: Not sure  Relieving factors: Not moving and voltaren    PRECAUTIONS: None  RED FLAGS: None   WEIGHT BEARING RESTRICTIONS: No  FALLS:  Has patient fallen in last 6 months? No  LIVING ENVIRONMENT: Lives with: lives with their daughter Lives in: House/apartment Stairs: No Has following equipment at home: Single point cane  OCCUPATION: Retired   PLOF: Independent  PATIENT GOALS: Feel less pain.   NEXT MD VISIT: Next month, September 2024   OBJECTIVE:   VITALS: BP 161/71 HR 71 SpO2 98%   DIAGNOSTIC FINDINGS:  CLINICAL DATA:   Low back pain; urinary urgency-frequency. Lower back pain radiates down left knee for 2 weeks.   EXAM: LUMBAR SPINE - COMPLETE 4+ VIEW   COMPARISON:  X-ray lumbar 04/03/2017.   FINDINGS: Normal lumbar alignment.  Negative for fracture or mass.   Mild disc degeneration in the lumbar spine. Diffuse facet arthropathy. No pars defect   Atherosclerotic calcification aorta without dilatation   IMPRESSION: Multilevel disc and facet degeneration. No acute abnormality and no interval change from the prior study     Electronically Signed   By: Marlan Palau M.D.   On: 09/02/2021 08:44    PATIENT SURVEYS:  FOTO 69/100 with target of 73   SCREENING FOR RED FLAGS: Bowel or bladder incontinence: No Spinal tumors: No Cauda equina syndrome: No Compression fracture: No Abdominal aneurysm: No  COGNITION: Overall cognitive status: Within functional limits for tasks assessed     SENSATION: WFL  MUSCLE LENGTH: Hamstrings: Right 90 deg; Left 90 deg Ely's Test: Positive Bilaterally   POSTURE: rounded shoulders  PALPATION: L1-L5 Central Spinous Process TTP   LUMBAR ROM:   AROM eval  Flexion 100%  Extension 50%*  Right lateral flexion 100%  Left lateral flexion 100%  Right rotation 100%  Left rotation 100%   (Blank rows = not tested)  LOWER EXTREMITY ROM:     Active  Right eval Left eval  Hip flexion    Hip extension    Hip abduction    Hip adduction    Hip internal rotation    Hip external rotation    Knee flexion    Knee extension    Ankle dorsiflexion    Ankle plantarflexion    Ankle inversion    Ankle eversion     (Blank rows = not tested)  LOWER EXTREMITY MMT:    MMT Right eval Left eval  Hip flexion 4 3+  Hip extension 3- 3+  Hip abduction 4- 4-  Hip adduction    Hip internal rotation    Hip external rotation    Knee flexion 4 4  Knee extension 4 4  Ankle dorsiflexion 4 4  Ankle plantarflexion    Ankle inversion    Ankle eversion      (Blank rows = not tested)  LUMBAR SPECIAL TESTS:  Straight leg raise test: Negative, FABER test: Positive, and FADIR Negative on LLE   FUNCTIONAL TESTS:  : NT   GAIT: Distance walked: 50 ft  Assistive device utilized: None Level of assistance: Complete Independence Comments: Left sided antalgic gait with decreased step length on RLE and stance time on LLE   TODAY'S TREATMENT:                                                                                                                              DATE:   02/07/23  Lower Trunk Rotations 2 x 10  Supine Bridges 1 x 10  Prone Quad Stretch 2 x 30 sec  Upper Trap Stretch 2 x 30 sec   PATIENT EDUCATION:  Education details: form and technique for correct performance of exercise  Person educated: Patient Education method: Programmer, multimedia, Demonstration, Verbal cues, and Handouts Education comprehension: verbalized understanding, returned demonstration, and verbal cues required  HOME EXERCISE PROGRAM: Access Code: [email or web address] URL: [email or web address] Date: 02/07/2023 Prepared by: Ellin Goodie  Exercises - Seated Upper Trapezius Stretch  - 1 x daily - 3 reps - 30-60 sec  hold - Prone Quadriceps Stretch with Strap  - 1 x daily - 3 reps - 30-60 sec  hold - Supine Lower Trunk Rotation  - 1 x daily - 2 sets - 10 reps - 3 sec  hold - Supine Bridge  - 3-4 x weekly - 3 sets - 10 reps  ASSESSMENT:  CLINICAL IMPRESSION: Patient is a 67 y.o. AA female who was seen today for physical therapy evaluation and treatment for subacute low back pain. Pt has a pmh that includes COPD, right sided hemorrhagic stroke, and arthritis in lumbar spine. She shows signs and symptoms that make her most appropriate for movement control group with low to moderate pain and disability and stable symptom status. She exhibits decreased hip strength and flexibility and increased shortness of breath with exertion that are limiting her ability to walk  long distances for child care tasks and to grocery shop. She will benefit from skilled PT to address these aforementioned deficits to improve pt's QOL.  OBJECTIVE IMPAIRMENTS: decreased ROM, decreased strength, hypomobility, impaired flexibility, obesity, and pain.   ACTIVITY LIMITATIONS: carrying, lifting, bending, squatting, stairs, dressing, and locomotion level  PARTICIPATION LIMITATIONS: cleaning, laundry, shopping, community activity, and yard work  PERSONAL FACTORS: Age, Fitness, Transportation, and 3+ comorbidities: COPD, h/o left sided stroke, and HTN   are also affecting patient's functional outcome.   REHAB POTENTIAL: Good  CLINICAL DECISION MAKING: Stable/uncomplicated  EVALUATION COMPLEXITY: Low   GOALS: Goals reviewed with patient? No  SHORT TERM GOALS: Target date: 02/21/2023  Pt will be independent with HEP in order to improve strength and balance in order to decrease fall risk and improve function at home and work. Baseline: NT  Goal status: INITIAL  2.  Pt will decrease 5TSTS by at least 3 seconds in order to demonstrate clinically significant improvement in LE strength Baseline: NT   Goal status: INITIAL     LONG TERM GOALS: Target date: 04/18/2023  Patient will have improved function and activity level as  evidenced by an increase in FOTO score by 4 points or more.  Baseline: 69/100 with target of 73  Goal status: INITIAL  2.  Pt will increase by at least 94m (11ft) in order to demonstrate clinically significant improvement in cardiopulmonary endurance and community ambulation Baseline: NT  Goal status: INITIAL  3.  Patient will perform five sit to stand repetitions in <=15 secs as evidence of LE strength that shows that this community dwelling older adult that is older is at a decreased risk of falling. (Buatois 2010)   Baseline: NT  Goal status: INITIAL  4.  Patient will complete >=11 sit to stand reps within 30 sec as evidence of LE endurance  that places here within age and gender matched norms and that signifies that she is at decreased risk for falling.  Baseline: NT  Goal status: INITIAL  5. Pt will increase by at least 0.13 m/s in order to demonstrate clinically significant improvement in community ambulation.  Baseline: NT   Goal status: INITIAL   PLAN:  PT FREQUENCY: 1-2x/week  PT DURATION: 10 weeks  PLANNED INTERVENTIONS: Therapeutic exercises, Therapeutic activity, Neuromuscular re-education, Balance training, Gait training, Patient/Family education, Self Care, Joint mobilization, Joint manipulation, Stair training, DME instructions, Aquatic Therapy, Dry Needling, Electrical stimulation, Spinal manipulation, Spinal mobilization, Cryotherapy, Moist heat, Traction, Manual therapy, and Re-evaluation.  PLAN FOR NEXT SESSION: , 5STS, 30 sec chair stands, 10 mWT,  progress strengthening and flexibility exercises for LEs  Ellin Goodie PT, DPT  [facility] [address] [place], [address] Phone: [phone]   Fax:  [phone]

## 2023-02-09 ENCOUNTER — Encounter: Payer: 59 | Admitting: Physical Therapy

## 2023-02-13 ENCOUNTER — Ambulatory Visit: Payer: 59 | Admitting: Physical Therapy

## 2023-02-14 ENCOUNTER — Encounter: Payer: 59 | Admitting: Physical Therapy

## 2023-02-15 ENCOUNTER — Ambulatory Visit: Payer: 59 | Admitting: Physical Therapy

## 2023-02-15 ENCOUNTER — Encounter: Payer: Self-pay | Admitting: Physical Therapy

## 2023-02-15 DIAGNOSIS — M5459 Other low back pain: Secondary | ICD-10-CM

## 2023-02-15 NOTE — Therapy (Addendum)
OUTPATIENT PHYSICAL THERAPY THORACOLUMBAR DISCHARGE  Discharge Summary: Pt has since no showed three times. Per policy patient will be removed from schedule.   Patient Name: Kristin Mora MRN: [id number] DOB:[birth year], 67 y.o., female Today's Date: 02/15/2023  END OF SESSION:  PT End of Session - 02/15/23 1344     Visit Number 2    Number of Visits 20    Date for PT Re-Evaluation 04/18/23    Authorization Type [employer] Medicare    Authorization Time Period 02/07/23-04/18/23    Authorization - Visit Number 2    Authorization - Number of Visits 20    Progress Note Due on Visit 10    PT Start Time 1345    PT Stop Time 1430    PT Time Calculation (min) 45 min    Activity Tolerance Patient tolerated treatment well    Behavior During Therapy [facility] for tasks assessed/performed             Past Medical History:  Diagnosis Date   Asthma    Brain aneurysm 04/2007   Diabetes mellitus without complication (HCC)    controlled;   HLD (hyperlipidemia)    Hypertension    controlled with medication   Stroke [facility])    Vertigo    Past Surgical History:  Procedure Laterality Date   COLONOSCOPY WITH PROPOFOL N/A 01/19/2022   Procedure: COLONOSCOPY WITH PROPOFOL;  Surgeon: Wyline Mood, MD;  Location: [facility] ENDOSCOPY;  Service: Gastroenterology;  Laterality: N/A;   CRANIOTOMY  04/28/2007   KNEE SURGERY     TONSILLECTOMY     TONSILLECTOMY AND ADENOIDECTOMY     TUBAL LIGATION     Patient Active Problem List   Diagnosis Date Noted   Vitamin D deficiency 10/21/2022   Acute right-sided low back pain without sciatica 10/21/2022   Drug-induced constipation 10/21/2022   Intertrigo 11/30/2021   Urinary urgency 08/25/2021   Strain of lumbar region 08/25/2021   Fall 07/30/2020   Acute pain of right knee 07/30/2020   History of right knee surgery 07/30/2020   Screening for breast cancer 10/20/2017   OSA (obstructive sleep apnea) 07/27/2017   Axillary hidradenitis suppurativa 06/29/2016    Vertigo 09/14/2015   Chronic airway obstruction (HCC) 11/04/2014   Acute cough 11/04/2014   Avitaminosis D 11/04/2014   Adiposity 11/04/2014   Hyperlipidemia associated with type 2 diabetes mellitus (HCC) 11/04/2014   Diabetes mellitus, type 2 (HCC) 10/01/2014   Hypertension associated with diabetes (HCC) 10/01/2014    PCP: Dr. Alfredia Ferguson   REFERRING PROVIDER: Dr. Alfredia Ferguson   REFERRING DIAG: M54.50,G89.29 (ICD-10-CM) - Chronic low back pain, unspecified back pain laterality, unspecified whether sciatica present R53.81 (ICD-10-CM) - Physical deconditioning E66.01 (ICD-10-CM) - Morbid obesity (HCC) M25.552 (ICD-10-CM) - Left hip pain  Rationale for Evaluation and Treatment: Rehabilitation  THERAPY DIAG:  Other low back pain  ONSET DATE: Alfredia Ferguson PA-C  SUBJECTIVE:  SUBJECTIVE STATEMENT: Pt reports that she experienced increased soreness and stiffness after last session.   PERTINENT HISTORY:  Pt's pmh includes COPD and history of right sided stroke with resulting RLE paresis. Pt reports that she has sudden bouts of pain that occur without a specific pattern. She has recently lost 100 lbs and she has increased shortness of breath when exerting herself.   PAIN:  Are you having pain? Yes: NPRS scale: 7/10 Pain location: Lumbar spine that radiates to bilateral hips  Pain description: Achy  Aggravating factors: Not sure  Relieving factors: Not moving and voltaren    PRECAUTIONS: None  RED FLAGS: None   WEIGHT BEARING RESTRICTIONS: No  FALLS:  Has patient fallen in last 6 months? No  LIVING ENVIRONMENT: Lives with: lives with their daughter Lives in: House/apartment Stairs: No Has following equipment at home: Single point cane  OCCUPATION: Retired   PLOF:  Independent  PATIENT GOALS: Feel less pain.   NEXT MD VISIT: Next month, September 2024   OBJECTIVE:   VITALS: BP 161/71 HR 71 SpO2 98%   DIAGNOSTIC FINDINGS:  CLINICAL DATA:  Low back pain; urinary urgency-frequency. Lower back pain radiates down left knee for 2 weeks.   EXAM: LUMBAR SPINE - COMPLETE 4+ VIEW   COMPARISON:  X-ray lumbar 04/03/2017.   FINDINGS: Normal lumbar alignment.  Negative for fracture or mass.   Mild disc degeneration in the lumbar spine. Diffuse facet arthropathy. No pars defect   Atherosclerotic calcification aorta without dilatation   IMPRESSION: Multilevel disc and facet degeneration. No acute abnormality and no interval change from the prior study     Electronically Signed   By: Marlan Palau M.D.   On: 09/02/2021 08:44    PATIENT SURVEYS:  FOTO 69/100 with target of 73   SCREENING FOR RED FLAGS: Bowel or bladder incontinence: No Spinal tumors: No Cauda equina syndrome: No Compression fracture: No Abdominal aneurysm: No  COGNITION: Overall cognitive status: Within functional limits for tasks assessed     SENSATION: WFL  MUSCLE LENGTH: Hamstrings: Right 90 deg; Left 90 deg Ely's Test: Positive Bilaterally   POSTURE: rounded shoulders  PALPATION: L1-L5 Central Spinous Process TTP   LUMBAR ROM:   AROM eval  Flexion 100%  Extension 50%*  Right lateral flexion 100%  Left lateral flexion 100%  Right rotation 100%  Left rotation 100%   (Blank rows = not tested)  LOWER EXTREMITY ROM:     Active  Right eval Left eval  Hip flexion    Hip extension    Hip abduction    Hip adduction    Hip internal rotation    Hip external rotation    Knee flexion    Knee extension    Ankle dorsiflexion    Ankle plantarflexion    Ankle inversion    Ankle eversion     (Blank rows = not tested)  LOWER EXTREMITY MMT:    MMT Right eval Left eval  Hip flexion 4 3+  Hip extension 3- 3+  Hip abduction 4- 4-  Hip adduction     Hip internal rotation    Hip external rotation    Knee flexion 4 4  Knee extension 4 4  Ankle dorsiflexion 4 4  Ankle plantarflexion    Ankle inversion    Ankle eversion     (Blank rows = not tested)  LUMBAR SPECIAL TESTS:  Straight leg raise test: Negative, FABER test: Positive, and FADIR Negative on LLE   FUNCTIONAL TESTS:  : 825 ft   GAIT: Distance walked: 50 ft  Assistive device utilized: None Level of assistance: Complete Independence Comments: Left sided antalgic gait with decreased step length on RLE and stance time on LLE   TODAY'S TREATMENT:                                                                                                                              DATE:   02/15/23: : 825 ft  -RPE 8/10  -Dyspnea Scale 8/10  5xSTS: 17 sec  30 sec chair stands: 8 reps  Sit to Stand 3 x 10  -min VC for increased speed with stand and decreased speed with sit  Standing Hip Abduction with BUE support 3 x 10  -min VC to stop external rotation of right foot   10 Meter Walk Test   Gait Speed:    1st trial 12.41 sec , 2nd trial 11.43 sec, Avg=  11.92 sec - Normal Gait speed Avg=  0.84 m/sec   -> 1 m/sec [employer] and cross street safely and normal WS <1 m/sec Need Intervention to reduce falls risk  0.12 m/sec improvement represents a statistically significant improvement in gait speed    - 0.8 - 1.3 m/sec - community ambulator - associated with increased independence in self-care  Seated HS Stretch 6 x 30 sec   02/07/23  Lower Trunk Rotations 2 x 10  Supine Bridges 1 x 10  Prone Quad Stretch 2 x 30 sec  Upper Trap Stretch 2 x 30 sec   PATIENT EDUCATION:  Education details: form and technique for correct performance of exercise  Person educated: Patient Education method: Programmer, multimedia, Demonstration, Verbal cues, and Handouts Education comprehension: verbalized understanding, returned demonstration, and verbal cues required  HOME EXERCISE  PROGRAM: Access Code: [email or web address] URL: [email or web address] Date: 02/15/2023 Prepared by: Ellin Goodie  Exercises - Supine Lower Trunk Rotation  - 1 x daily - 2 sets - 10 reps - 3 sec  hold - Seated Upper Trapezius Stretch  - 1 x daily - 3 reps - 30-60 sec  hold - Prone Quadriceps Stretch with Strap  - 1 x daily - 3 reps - 30-60 sec  hold - Sit to Stand  - 3-4 x weekly - 3 sets - 10 reps - Standing Hip Abduction with Counter Support  - 3-4 x weekly - 3 sets - 10 reps  ASSESSMENT:  CLINICAL IMPRESSION: Pt demonstrates decreased LE strength and endurance that places her at an increased risk for falls. She also has gait speed that places her at risk for falls. She shows progress towards goals with ability to complete a higher volume of exercises during today's session and sit to stands for LE strengthening. She will continue to benefit from skilled PT to address these aforementioned deficits to improve pt's QOL.   OBJECTIVE IMPAIRMENTS: decreased ROM, decreased strength, hypomobility, impaired flexibility, obesity, and pain.   ACTIVITY LIMITATIONS: carrying, lifting, bending, squatting, stairs, dressing,  and locomotion level  PARTICIPATION LIMITATIONS: cleaning, laundry, shopping, community activity, and yard work  PERSONAL FACTORS: Age, Fitness, Transportation, and 3+ comorbidities: COPD, h/o left sided stroke, and HTN   are also affecting patient's functional outcome.   REHAB POTENTIAL: Good  CLINICAL DECISION MAKING: Stable/uncomplicated  EVALUATION COMPLEXITY: Low   GOALS: Goals reviewed with patient? No  SHORT TERM GOALS: Target date: 02/21/2023  Pt will be independent with HEP in order to improve strength and balance in order to decrease fall risk and improve function at home and work. Baseline: NT 02/15/23: Performing exercises independently  Goal status: ACHIEVED   2.  Pt will decrease 5TSTS by at least 3 seconds in order to demonstrate clinically  significant improvement in LE strength Baseline: 17 sec  Goal status: NOT MET      LONG TERM GOALS: Target date: 04/18/2023  Patient will have improved function and activity level as evidenced by an increase in FOTO score by 4 points or more.  Baseline: 69/100 with target of 73  Goal status: NOT MET   2.  Pt will increase by at least 37m (122ft) in order to demonstrate clinically significant improvement in cardiopulmonary endurance and community ambulation Baseline: 825 ft  Goal status: NOT MET   3.  Patient will perform five sit to stand repetitions in <=15 secs as evidence of LE strength that shows that this community dwelling older adult that is older is at a decreased risk of falling. (Buatois 2010)   Baseline: 17 sec  Goal status: NOT MET   4.  Patient will complete >=11 sit to stand reps within 30 sec as evidence of LE endurance that places here within age and gender matched norms and that signifies that she is at decreased risk for falling.  Baseline: 8 reps  Goal status: NOT MET    5. Pt will increase by at least 0.13 m/s in order to demonstrate clinically significant improvement in community ambulation.  Baseline: 0.84 m/sec    Goal status: NOT MET    PLAN:  PT FREQUENCY: 1-2x/week  PT DURATION: 10 weeks  PLANNED INTERVENTIONS: Therapeutic exercises, Therapeutic activity, Neuromuscular re-education, Balance training, Gait training, Patient/Family education, Self Care, Joint mobilization, Joint manipulation, Stair training, DME instructions, Aquatic Therapy, Dry Needling, Electrical stimulation, Spinal manipulation, Spinal mobilization, Cryotherapy, Moist heat, Traction, Manual therapy, and Re-evaluation.  PLAN FOR NEXT SESSION:  Discharge from PT.   Ellin Goodie PT, DPT  [facility] [address] [place], [address] Phone: [phone]   Fax:  [phone]

## 2023-02-16 ENCOUNTER — Encounter: Payer: 59 | Admitting: Physical Therapy

## 2023-02-20 ENCOUNTER — Ambulatory Visit: Payer: 59 | Admitting: Physical Therapy

## 2023-02-20 ENCOUNTER — Telehealth: Payer: Self-pay | Admitting: Physical Therapy

## 2023-02-20 NOTE — Telephone Encounter (Signed)
Called pt to inquire about absence. Did not reach so left VM instructing pt to call back to reschedule.

## 2023-02-21 ENCOUNTER — Encounter: Payer: 59 | Admitting: Physical Therapy

## 2023-02-23 ENCOUNTER — Ambulatory Visit: Payer: 59 | Admitting: Physical Therapy

## 2023-02-23 ENCOUNTER — Encounter: Payer: 59 | Admitting: Physical Therapy

## 2023-02-28 ENCOUNTER — Ambulatory Visit: Payer: 59 | Admitting: Physical Therapy

## 2023-03-01 ENCOUNTER — Encounter: Payer: 59 | Admitting: Physical Therapy

## 2023-03-05 ENCOUNTER — Other Ambulatory Visit: Payer: Self-pay | Admitting: Physician Assistant

## 2023-03-05 DIAGNOSIS — I152 Hypertension secondary to endocrine disorders: Secondary | ICD-10-CM

## 2023-03-06 ENCOUNTER — Ambulatory Visit: Payer: 59 | Admitting: Physical Therapy

## 2023-03-07 ENCOUNTER — Encounter: Payer: 59 | Admitting: Physical Therapy

## 2023-03-08 ENCOUNTER — Ambulatory Visit: Payer: 59 | Attending: Physician Assistant | Admitting: Physical Therapy

## 2023-03-08 ENCOUNTER — Telehealth: Payer: Self-pay | Admitting: Physical Therapy

## 2023-03-08 NOTE — Telephone Encounter (Signed)
Called pt to inquire about absence from PT apt. Did not reach so left VM instructing pt to call back.

## 2023-03-09 ENCOUNTER — Encounter: Payer: 59 | Admitting: Physical Therapy

## 2023-03-10 ENCOUNTER — Other Ambulatory Visit: Payer: Self-pay | Admitting: Physician Assistant

## 2023-03-10 DIAGNOSIS — E1159 Type 2 diabetes mellitus with other circulatory complications: Secondary | ICD-10-CM

## 2023-03-14 ENCOUNTER — Telehealth: Payer: Self-pay | Admitting: Physical Therapy

## 2023-03-14 ENCOUNTER — Other Ambulatory Visit: Payer: Self-pay | Admitting: Physician Assistant

## 2023-03-14 ENCOUNTER — Encounter: Payer: 59 | Admitting: Physical Therapy

## 2023-03-14 ENCOUNTER — Ambulatory Visit: Payer: 59 | Admitting: Physical Therapy

## 2023-03-14 DIAGNOSIS — E119 Type 2 diabetes mellitus without complications: Secondary | ICD-10-CM

## 2023-03-14 NOTE — Telephone Encounter (Signed)
Called pt to inquire about absence from apt. Did not reach but VM left explaining that tp would be removed from schedule due to attendance policy and this being a consecutive no show.

## 2023-03-16 ENCOUNTER — Encounter: Payer: 59 | Admitting: Physical Therapy

## 2023-03-21 ENCOUNTER — Ambulatory Visit: Payer: 59 | Admitting: Physical Therapy

## 2023-03-21 ENCOUNTER — Encounter: Payer: 59 | Admitting: Physical Therapy

## 2023-03-23 ENCOUNTER — Encounter: Payer: 59 | Admitting: Physical Therapy

## 2023-03-28 ENCOUNTER — Encounter: Payer: 59 | Admitting: Physical Therapy

## 2023-03-29 ENCOUNTER — Encounter: Payer: 59 | Admitting: Physical Therapy

## 2023-03-30 ENCOUNTER — Encounter: Payer: 59 | Admitting: Physical Therapy

## 2023-04-03 ENCOUNTER — Encounter: Payer: 59 | Admitting: Physical Therapy

## 2023-04-05 ENCOUNTER — Encounter: Payer: 59 | Admitting: Physical Therapy

## 2023-04-07 ENCOUNTER — Ambulatory Visit: Payer: 59 | Admitting: Physician Assistant

## 2023-04-07 ENCOUNTER — Other Ambulatory Visit: Payer: Self-pay | Admitting: Physician Assistant

## 2023-04-11 ENCOUNTER — Encounter: Payer: 59 | Admitting: Physical Therapy

## 2023-04-13 ENCOUNTER — Encounter: Payer: 59 | Admitting: Physical Therapy

## 2023-04-18 ENCOUNTER — Encounter: Payer: 59 | Admitting: Physical Therapy

## 2023-04-20 ENCOUNTER — Encounter: Payer: 59 | Admitting: Physical Therapy

## 2023-04-24 ENCOUNTER — Other Ambulatory Visit: Payer: Self-pay | Admitting: Physician Assistant

## 2023-04-24 ENCOUNTER — Encounter: Payer: 59 | Admitting: Physical Therapy

## 2023-04-24 DIAGNOSIS — E7800 Pure hypercholesterolemia, unspecified: Secondary | ICD-10-CM

## 2023-04-26 ENCOUNTER — Encounter: Payer: 59 | Admitting: Physical Therapy

## 2023-05-05 ENCOUNTER — Ambulatory Visit: Payer: 59 | Admitting: Family Medicine

## 2023-05-05 NOTE — Progress Notes (Deleted)
      Established patient visit   Patient: Kristin Mora   DOB: 1956/04/12   67 y.o. Female  MRN: [id number] Visit Date: 05/05/2023  Today's healthcare provider: Sherlyn Hay, DO   No chief complaint on file.  Subjective    HPI .hpisecconsentabridge  Right knee pain Check shoes  ***  {History (Optional):23778}  Medications: Outpatient Medications Prior to Visit  Medication Sig   amLODipine (NORVASC) 10 MG tablet Take 1 tablet by mouth once daily   atorvastatin (LIPITOR) 80 MG tablet Take 1 tablet by mouth once daily   Blood Pressure Monitoring (BLOOD PRESSURE DIGITAL SOLN) KIT Dispense one automatic/digital blood pressure machine; check blood pressure twice daily in AM and PM   hydrocortisone 1 % lotion Apply 1 Application topically 2 (two) times daily. For itching   ketoconazole (NIZORAL) 2 % cream Apply 1 Application topically daily. On R armpit, under R breast   lactulose (CHRONULAC) 10 GM/15ML solution Take 15 mLs (10 g total) by mouth 2 (two) times daily as needed for severe constipation.   lidocaine (LIDODERM) 5 % Place 1 patch onto the skin daily. Remove & Discard patch within 12 hours or as directed by MD   losartan-hydrochlorothiazide (HYZAAR) 100-25 MG tablet Take 1 tablet by mouth daily.   metFORMIN (GLUCOPHAGE) 500 MG tablet TAKE 1 TABLET BY MOUTH TWICE DAILY WITH A MEAL   Miconazole-Zinc Oxide-Petrolat 0.25-15-81.35 % OINT Apply to affected skin twice a day until clear   oxybutynin (DITROPAN) 5 MG tablet Take 1 tablet (5 mg total) by mouth 2 (two) times daily.   OZEMPIC, 2 MG/DOSE, 8 MG/3ML SOPN INJECT 1 DOSE (2 MG) SUBCUTANEOUSLY ONCE A WEEK   terbinafine (LAMISIL) 1 % cream Apply 1 Application topically 2 (two) times daily. To affected areas of rash under breast and under arms   triamcinolone (KENALOG) 0.025 % ointment Apply 1 Application topically 2 (two) times daily.   Vitamin D, Ergocalciferol, (DRISDOL) 1.25 MG (50000 UNIT) CAPS capsule Take 1 capsule by  mouth once a week   No facility-administered medications prior to visit.    Review of Systems  ***  {Insert previous labs (optional):23779} {See past labs  Heme  Chem  Endocrine  Serology  Results Review (optional):1}   Objective    There were no vitals taken for this visit. {Insert last BP/Wt (optional):23777}{See vitals history (optional):1}   Physical Exam  .pesec  No results found for any visits on 05/05/23.  Assessment & Plan    There are no diagnoses linked to this encounter. Marland Kitchenapsecabridge  ***  No follow-ups on file.      I discussed the assessment and treatment plan with the patient  The patient was provided an opportunity to ask questions and all were answered. The patient agreed with the plan and demonstrated an understanding of the instructions.   The patient was advised to call back or seek an in-person evaluation if the symptoms worsen or if the condition fails to improve as anticipated.    Sherlyn Hay, DO  [facility] [phone] (phone) [phone] (fax)  [facility]

## 2023-05-09 ENCOUNTER — Emergency Department
Admission: EM | Admit: 2023-05-09 | Discharge: 2023-05-09 | Disposition: A | Discharge status: Home / Self Care | Payer: 59 | Attending: Student in an Organized Health Care Education/Training Program | Admitting: Student in an Organized Health Care Education/Training Program

## 2023-05-09 ENCOUNTER — Emergency Department: Payer: 59

## 2023-05-09 ENCOUNTER — Other Ambulatory Visit: Payer: Self-pay

## 2023-05-09 ENCOUNTER — Encounter: Payer: Self-pay | Admitting: Emergency Medicine

## 2023-05-09 DIAGNOSIS — R112 Nausea with vomiting, unspecified: Secondary | ICD-10-CM | POA: Insufficient documentation

## 2023-05-09 DIAGNOSIS — R519 Headache, unspecified: Secondary | ICD-10-CM | POA: Insufficient documentation

## 2023-05-09 LAB — CBC WITH DIFFERENTIAL/PLATELET
Abs Immature Granulocytes: 0.04 10*3/uL (ref 0.00–0.07)
Basophils Absolute: 0 10*3/uL (ref 0.0–0.1)
Basophils Relative: 0 %
Eosinophils Absolute: 0 10*3/uL (ref 0.0–0.5)
Eosinophils Relative: 0 %
HCT: 45.1 % (ref 36.0–46.0)
Hemoglobin: 15 g/dL (ref 12.0–15.0)
Immature Granulocytes: 0 %
Lymphocytes Relative: 26 %
Lymphs Abs: 2.9 10*3/uL (ref 0.7–4.0)
MCH: 30.3 pg (ref 26.0–34.0)
MCHC: 33.3 g/dL (ref 30.0–36.0)
MCV: 91.1 fL (ref 80.0–100.0)
Monocytes Absolute: 0.9 10*3/uL (ref 0.1–1.0)
Monocytes Relative: 8 %
Neutro Abs: 7.2 10*3/uL (ref 1.7–7.7)
Neutrophils Relative %: 66 %
Platelets: 271 10*3/uL (ref 150–400)
RBC: 4.95 MIL/uL (ref 3.87–5.11)
RDW: 13.6 % (ref 11.5–15.5)
WBC: 11.1 10*3/uL — ABNORMAL HIGH (ref 4.0–10.5)
nRBC: 0 % (ref 0.0–0.2)

## 2023-05-09 LAB — BASIC METABOLIC PANEL
Anion gap: 9 (ref 5–15)
BUN: 16 mg/dL (ref 8–23)
CO2: 26 mmol/L (ref 22–32)
Calcium: 9.3 mg/dL (ref 8.9–10.3)
Chloride: 99 mmol/L (ref 98–111)
Creatinine, Ser: 0.77 mg/dL (ref 0.44–1.00)
GFR, Estimated: >60 mL/min (ref >60)
Glucose, Bld: 99 mg/dL (ref 70–99)
Potassium: 3.5 mmol/L (ref 3.5–5.1)
Sodium: 134 mmol/L — ABNORMAL LOW (ref 135–145)

## 2023-05-09 NOTE — ED Provider Notes (Signed)
[facility] Provider Note    Event Date/Time   First MD Initiated Contact with Patient 05/09/23 0827     (approximate)   History   Headache   HPI  Kristin Mora is a 67 y.o. female with a history of cerebral aneurysm status post coiling presents to the ER for evaluation of headache nausea vomiting started last night.  States the headache felt similar to when she previously was diagnosed with aneurysm.  She denies any pain right now no numbness or tingling no fevers.  Did have some pressure in the front of her head.  No blurry vision.     Physical Exam   Triage Vital Signs: ED Triage Vitals  Encounter Vitals Group     BP 05/09/23 0819 (!) 160/121     Systolic BP Percentile --      Diastolic BP Percentile --      Pulse Rate 05/09/23 0819 71     Resp 05/09/23 0819 18     Temp 05/09/23 0819 97.6 F (36.4 C)     Temp Source 05/09/23 0819 Oral     SpO2 05/09/23 0819 98 %     Weight 05/09/23 0818 198 lb (89.8 kg)     Height 05/09/23 0818 5\' 2"  (1.575 m)     Head Circumference --      Peak Flow --      Pain Score 05/09/23 0818 3     Pain Loc --      Pain Education --      Exclude from Growth Chart --     Most recent vital signs: Vitals:   05/09/23 1000 05/09/23 1030  BP: (!) 182/83 (!) 153/63  Pulse: (!) 58 (!) 59  Resp:    Temp:    SpO2: 97% 96%     Constitutional: Alert  Eyes: Conjunctivae are normal.  Head: Atraumatic. Nose: No congestion/rhinnorhea. Mouth/Throat: Mucous membranes are moist.   Neck: Painless ROM.  Cardiovascular:   Good peripheral circulation. Respiratory: Normal respiratory effort.  No retractions.  Gastrointestinal: Soft and nontender.  Musculoskeletal:  no deformity Neurologic:  MAE spontaneously. No gross focal neurologic deficits are appreciated.  Skin:  Skin is warm, dry and intact. No rash noted. Psychiatric: Mood and affect are normal. Speech and behavior are normal.    ED Results / Procedures /  Treatments   Labs (all labs ordered are listed, but only abnormal results are displayed) Labs Reviewed  CBC WITH DIFFERENTIAL/PLATELET - Abnormal; Notable for the following components:      Result Value   WBC 11.1 (*)    All other components within normal limits  BASIC METABOLIC PANEL - Abnormal; Notable for the following components:   Sodium 134 (*)    All other components within normal limits     EKG     RADIOLOGY Please see ED Course for my review and interpretation.  I personally reviewed all radiographic images ordered to evaluate for the above acute complaints and reviewed radiology reports and findings.  These findings were personally discussed with the patient.  Please see medical record for radiology report.    PROCEDURES:  Critical Care performed:   Procedures   MEDICATIONS ORDERED IN ED: Medications - No data to display   IMPRESSION / MDM / ASSESSMENT AND PLAN / ED COURSE  I reviewed the triage vital signs and the nursing notes.  Differential diagnosis includes, but is not limited to, Patient presenting to the ER for evaluation of symptoms as described above.  Based on symptoms, risk factors and considered above differential, this presenting complaint could reflect a potentially life-threatening illness therefore the patient will be placed on continuous pulse oximetry and telemetry for monitoring.  Laboratory evaluation will be sent to evaluate for the above complaints.  Patient nontoxic-appearing no focal neurodeficits.  Given history will order CT imaging.  Patient denies any pain or discomfort right now therefore have lower suspicion for sentinel bleed or SAH but given history will order imaging.  She is declining any pain medication.   Clinical Course as of 05/09/23 1051  Tue May 09, 2023  1005 CT head on my review and interpretation does not show any evidence of SAH or IPH. [PR]  1050 Imaging reassuring without evidence of  acute abnormality.  She is not have any signs and symptoms of CVA.  Does have a history of CVA therefore I think the findings on CT are likely from remote infarct.  Given her well appearance as she is asymptomatic I do not feel that further diagnostic testing clinically indicated at this time. [PR]    Clinical Course User Index [PR] Willy Eddy, MD     FINAL CLINICAL IMPRESSION(S) / ED DIAGNOSES   Final diagnoses:  Acute nonintractable headache, unspecified headache type     Rx / DC Orders   ED Discharge Orders     None        Note:  This document was prepared using Dragon voice recognition software and may include unintentional dictation errors.    Willy Eddy, MD 05/09/23 1051

## 2023-05-09 NOTE — ED Triage Notes (Signed)
Patient to ED via POV for headache that started yesterday after having an episode of vomiting. Hx of stroke and aneurysm. Denies weakness or numbness.  Has not taken BP meds this AM.

## 2023-05-09 NOTE — Discharge Instructions (Signed)

## 2023-05-17 ENCOUNTER — Ambulatory Visit (INDEPENDENT_AMBULATORY_CARE_PROVIDER_SITE_OTHER): Payer: 59 | Admitting: Family Medicine

## 2023-05-17 ENCOUNTER — Encounter: Payer: Self-pay | Admitting: Family Medicine

## 2023-05-17 VITALS — BP 161/61 | HR 73 | Resp 16 | Ht 62.0 in | Wt 223.4 lb

## 2023-05-17 DIAGNOSIS — D689 Coagulation defect, unspecified: Secondary | ICD-10-CM

## 2023-05-17 DIAGNOSIS — F01B3 Vascular dementia, moderate, with mood disturbance: Secondary | ICD-10-CM | POA: Diagnosis not present

## 2023-05-17 DIAGNOSIS — Z23 Encounter for immunization: Secondary | ICD-10-CM

## 2023-05-17 DIAGNOSIS — Z09 Encounter for follow-up examination after completed treatment for conditions other than malignant neoplasm: Secondary | ICD-10-CM | POA: Diagnosis not present

## 2023-05-17 DIAGNOSIS — M461 Sacroiliitis, not elsewhere classified: Secondary | ICD-10-CM

## 2023-05-17 DIAGNOSIS — Z812 Family history of tobacco abuse and dependence: Secondary | ICD-10-CM

## 2023-05-17 DIAGNOSIS — J449 Chronic obstructive pulmonary disease, unspecified: Secondary | ICD-10-CM

## 2023-05-17 DIAGNOSIS — I152 Hypertension secondary to endocrine disorders: Secondary | ICD-10-CM

## 2023-05-17 DIAGNOSIS — G40909 Epilepsy, unspecified, not intractable, without status epilepticus: Secondary | ICD-10-CM

## 2023-05-17 DIAGNOSIS — E1159 Type 2 diabetes mellitus with other circulatory complications: Secondary | ICD-10-CM

## 2023-05-17 MED ORDER — DONEPEZIL HCL 5 MG PO TBDP: 5.0000 mg | ORAL_TABLET | Freq: Every day | ORAL | 11 refills | Status: DC

## 2023-05-17 MED ORDER — MEMANTINE HCL ER 7 MG PO CP24: 7.0000 mg | ORAL_CAPSULE | Freq: Every day | ORAL | 11 refills | Status: DC

## 2023-05-17 MED ORDER — METOPROLOL SUCCINATE ER 25 MG PO TB24: 25.0000 mg | ORAL_TABLET | Freq: Every day | ORAL | 0 refills | Status: DC

## 2023-05-17 NOTE — Patient Instructions (Signed)
The CDC recommends two doses of Shingrix (the new shingles vaccine) separated by 2 to 6 months for adults age 69 years and older. I recommend checking with your insurance plan regarding coverage for this vaccine.    

## 2023-05-17 NOTE — Progress Notes (Signed)
Established patient visit   Patient: Kristin Mora   DOB: 1955/09/19   67 y.o. Female  MRN: [id number] Visit Date: 05/17/2023  Today's healthcare provider: Jacky Kindle, FNP  Re Introduced to nurse practitioner role and practice setting.  All questions answered.  Discussed provider/patient relationship and expectations.  Chief Complaint  Patient presents with   Hospitalization Follow-up    ER follow-up. Patient was seen 05/09/2023 and would like to go over CT scan.   Subjective    HPI HPI     Hospitalization Follow-up    Additional comments: ER follow-up. Patient was seen 05/09/2023 and would like to go over CT scan.      Last edited by Marjie Skiff, CMA on 05/17/2023  3:02 PM.      The patient, with a history of stroke and hypertension, presents for a follow-up visit after a recent hospitalization. The hospitalization was due to high blood pressure and headaches. The patient's daughter reports that the patient had been vomiting the night before the hospitalization, which may have contributed to the headaches. The patient's blood pressure remains high despite current medications, including amlodipine, losartan, and hydrochlorothiazide. The patient's daughter expresses concern about potential dementia, noting changes in the patient's memory and behavior.  Medications: Outpatient Medications Prior to Visit  Medication Sig   amLODipine (NORVASC) 10 MG tablet Take 1 tablet by mouth once daily   atorvastatin (LIPITOR) 80 MG tablet Take 1 tablet by mouth once daily   hydrocortisone 1 % lotion Apply 1 Application topically 2 (two) times daily. For itching   losartan-hydrochlorothiazide (HYZAAR) 100-25 MG tablet Take 1 tablet by mouth daily.   metFORMIN (GLUCOPHAGE) 500 MG tablet TAKE 1 TABLET BY MOUTH TWICE DAILY WITH A MEAL   OZEMPIC, 2 MG/DOSE, 8 MG/3ML SOPN INJECT 1 DOSE (2 MG) SUBCUTANEOUSLY ONCE A WEEK   Blood Pressure Monitoring (BLOOD PRESSURE DIGITAL SOLN) KIT  Dispense one automatic/digital blood pressure machine; check blood pressure twice daily in AM and PM   ketoconazole (NIZORAL) 2 % cream Apply 1 Application topically daily. On R armpit, under R breast   lactulose (CHRONULAC) 10 GM/15ML solution Take 15 mLs (10 g total) by mouth 2 (two) times daily as needed for severe constipation.   lidocaine (LIDODERM) 5 % Place 1 patch onto the skin daily. Remove & Discard patch within 12 hours or as directed by MD   Miconazole-Zinc Oxide-Petrolat 0.25-15-81.35 % OINT Apply to affected skin twice a day until clear   oxybutynin (DITROPAN) 5 MG tablet Take 1 tablet (5 mg total) by mouth 2 (two) times daily.   terbinafine (LAMISIL) 1 % cream Apply 1 Application topically 2 (two) times daily. To affected areas of rash under breast and under arms   triamcinolone (KENALOG) 0.025 % ointment Apply 1 Application topically 2 (two) times daily.   Vitamin D, Ergocalciferol, (DRISDOL) 1.25 MG (50000 UNIT) CAPS capsule Take 1 capsule by mouth once a week   No facility-administered medications prior to visit.      Objective    BP (!) 161/61 (BP Location: Left Arm, Patient Position: Sitting, Cuff Size: Large)   Pulse 73   Resp 16   Ht 5\' 2"  (1.575 m)   Wt 223 lb 6.4 oz (101.3 kg)   SpO2 99%   BMI 40.86 kg/m   Physical Exam Vitals and nursing note reviewed.  Constitutional:      General: She is not in acute distress.    Appearance: Normal appearance.  She is obese. She is not ill-appearing, toxic-appearing or diaphoretic.  HENT:     Head: Normocephalic and atraumatic.  Cardiovascular:     Rate and Rhythm: Normal rate and regular rhythm.     Pulses: Normal pulses.     Heart sounds: Normal heart sounds. No murmur heard.    No friction rub. No gallop.  Pulmonary:     Effort: Pulmonary effort is normal. No respiratory distress.     Breath sounds: Normal breath sounds. No stridor. No wheezing, rhonchi or rales.  Chest:     Chest wall: No tenderness.  Abdominal:      General: Bowel sounds are normal.     Palpations: Abdomen is soft.  Musculoskeletal:        General: No swelling, tenderness, deformity or signs of injury. Normal range of motion.     Right lower leg: No edema.     Left lower leg: No edema.  Skin:    General: Skin is warm and dry.     Capillary Refill: Capillary refill takes less than 2 seconds.     Coloration: Skin is not jaundiced or pale.     Findings: No bruising, erythema, lesion or rash.  Neurological:     General: No focal deficit present.     Mental Status: She is alert and oriented to person, place, and time. Mental status is at baseline.     Cranial Nerves: No cranial nerve deficit.     Sensory: No sensory deficit.     Motor: No weakness.     Coordination: Coordination normal.  Psychiatric:        Mood and Affect: Mood normal.        Behavior: Behavior normal.        Thought Content: Thought content normal.        Judgment: Judgment normal.     No results found for any visits on 05/17/23.  Assessment & Plan     Problem List Items Addressed This Visit       Cardiovascular and Mediastinum   Hypertension associated with diabetes (HCC) - Primary   Relevant Medications   metoprolol succinate (TOPROL XL) 25 MG 24 hr tablet   Other Relevant Orders   Hemoglobin A1c   CBC with Differential/Platelet   Comprehensive Metabolic Panel (CMET)   TSH   Lipid panel   Urinalysis, Routine w reflex microscopic   Urine Microalbumin w/creat. ratio   B12 and Folate Panel   Vitamin D (25 hydroxy)   Other Visit Diagnoses     Moderate vascular dementia with mood disturbance (HCC)       Relevant Medications   memantine (NAMENDA XR) 7 MG CP24 24 hr capsule   donepezil (ARICEPT ODT) 5 MG disintegrating tablet   Other Relevant Orders   Ambulatory referral to Neurology   Hemoglobin A1c   CBC with Differential/Platelet   Comprehensive Metabolic Panel (CMET)   TSH   Lipid panel   Urinalysis, Routine w reflex microscopic    Urine Microalbumin w/creat. ratio   B12 and Folate Panel   Vitamin D (25 hydroxy)   Immunization due       Relevant Orders   Flu Vaccine Trivalent High Dose (Fluad) (Completed)     Hypertension Uncontrolled despite max doses of amlodipine and losartan-hydrochlorothiazide combination. Discussed options including switching to valsartan, adding hydralazine, or adding a beta blocker. Concerns about potential fatigue with beta blocker use. -Add Metoprolol 25mg  daily. -Check blood pressure at home regularly. -Follow-up in 1  month to assess blood pressure control.  Cerebrovascular Accident History of stroke in 2016. No new events noted on recent imaging. Normal brain aging observed. -Continue current management.  Possible Mild Cognitive Impairment Daughter expressed concerns about possible mild dementia. Discussed potential interventions including dietary changes and medications. -Consider further evaluation and potential interventions based on family discussion.  General Health Maintenance -Administer influenza vaccine today. -Plan to check A1C at next visit.  No follow-ups on file.     Leilani Merl, FNP, have reviewed all documentation for this visit. The documentation on 05/17/23 for the exam, diagnosis, procedures, and orders are all accurate and complete.  Jacky Kindle, FNP  [facility] [phone] (phone) [phone] (fax)  [facility]

## 2023-05-19 ENCOUNTER — Other Ambulatory Visit: Payer: Self-pay | Admitting: Family Medicine

## 2023-05-19 DIAGNOSIS — E538 Deficiency of other specified B group vitamins: Secondary | ICD-10-CM

## 2023-05-22 LAB — HEMOGLOBIN A1C
Est. average glucose Bld gHb Est-mCnc: 120 mg/dL
Hgb A1c MFr Bld: 5.8 % — ABNORMAL HIGH (ref 4.8–5.6)

## 2023-05-22 LAB — CBC WITH DIFFERENTIAL/PLATELET
Basophils Absolute: 0.1 10*3/uL (ref 0.0–0.2)
Basos: 1 %
EOS (ABSOLUTE): 0.4 10*3/uL (ref 0.0–0.4)
Eos: 4 %
Hematocrit: 44.4 % (ref 34.0–46.6)
Hemoglobin: 14.2 g/dL (ref 11.1–15.9)
Immature Grans (Abs): 0 10*3/uL (ref 0.0–0.1)
Immature Granulocytes: 0 %
Lymphocytes Absolute: 3 10*3/uL (ref 0.7–3.1)
Lymphs: 30 %
MCH: 30.1 pg (ref 26.6–33.0)
MCHC: 32 g/dL (ref 31.5–35.7)
MCV: 94 fL (ref 79–97)
Monocytes Absolute: 0.8 10*3/uL (ref 0.1–0.9)
Monocytes: 8 %
Neutrophils Absolute: 5.7 10*3/uL (ref 1.4–7.0)
Neutrophils: 57 %
Platelets: 266 10*3/uL (ref 150–450)
RBC: 4.72 x10E6/uL (ref 3.77–5.28)
RDW: 13.2 % (ref 11.7–15.4)
WBC: 10 10*3/uL (ref 3.4–10.8)

## 2023-05-22 LAB — URINALYSIS, ROUTINE W REFLEX MICROSCOPIC

## 2023-05-22 LAB — COMPREHENSIVE METABOLIC PANEL
ALT: 9 IU/L (ref 0–32)
AST: 15 [IU]/L (ref 0–40)
Albumin: 3.6 g/dL — ABNORMAL LOW (ref 3.9–4.9)
Alkaline Phosphatase: 65 IU/L (ref 44–121)
BUN/Creatinine Ratio: 19 (ref 12–28)
BUN: 15 mg/dL (ref 8–27)
Bilirubin Total: 0.7 mg/dL (ref 0.0–1.2)
CO2: 25 mmol/L (ref 20–29)
Calcium: 9.8 mg/dL (ref 8.7–10.3)
Chloride: 101 mmol/L (ref 96–106)
Creatinine, Ser: 0.79 mg/dL (ref 0.57–1.00)
Globulin, Total: 3.1 g/dL (ref 1.5–4.5)
Glucose: 83 mg/dL (ref 70–99)
Potassium: 4.4 mmol/L (ref 3.5–5.2)
Sodium: 141 mmol/L (ref 134–144)
Total Protein: 6.7 g/dL (ref 6.0–8.5)
eGFR: 81 mL/min/{1.73_m2} (ref >59)

## 2023-05-22 LAB — LIPID PANEL
Chol/HDL Ratio: 3.4 ratio (ref 0.0–4.4)
Cholesterol, Total: 121 mg/dL (ref 100–199)
HDL: 36 mg/dL — ABNORMAL LOW (ref >39)
LDL Chol Calc (NIH): 66 mg/dL (ref 0–99)
Triglycerides: 103 mg/dL (ref 0–149)
VLDL Cholesterol Cal: 19 mg/dL (ref 5–40)

## 2023-05-22 LAB — MICROALBUMIN / CREATININE URINE RATIO

## 2023-05-22 LAB — B12 AND FOLATE PANEL
Folate: 6.7 ng/mL (ref >3.0)
Vitamin B-12: <50 pg/mL — ABNORMAL LOW (ref 232–1245)

## 2023-05-22 LAB — VITAMIN D 25 HYDROXY (VIT D DEFICIENCY, FRACTURES): Vit D, 25-Hydroxy: 35.6 ng/mL (ref 30.0–100.0)

## 2023-05-22 LAB — TSH: TSH: 1.2 u[IU]/mL (ref 0.450–4.500)

## 2023-06-02 ENCOUNTER — Inpatient Hospital Stay: Payer: 59

## 2023-06-02 ENCOUNTER — Inpatient Hospital Stay: Payer: 59 | Admitting: Internal Medicine

## 2023-06-09 ENCOUNTER — Inpatient Hospital Stay: Payer: 59

## 2023-06-09 ENCOUNTER — Encounter: Payer: Self-pay | Admitting: Internal Medicine

## 2023-06-09 ENCOUNTER — Inpatient Hospital Stay: Payer: 59 | Attending: Internal Medicine | Admitting: Internal Medicine

## 2023-06-09 VITALS — BP 160/75 | HR 68 | Temp 96.0°F | Resp 16 | Wt 226.0 lb

## 2023-06-09 DIAGNOSIS — Z87891 Personal history of nicotine dependence: Secondary | ICD-10-CM | POA: Insufficient documentation

## 2023-06-09 DIAGNOSIS — I10 Essential (primary) hypertension: Secondary | ICD-10-CM | POA: Insufficient documentation

## 2023-06-09 DIAGNOSIS — Z7984 Long term (current) use of oral hypoglycemic drugs: Secondary | ICD-10-CM | POA: Diagnosis not present

## 2023-06-09 DIAGNOSIS — J45909 Unspecified asthma, uncomplicated: Secondary | ICD-10-CM | POA: Insufficient documentation

## 2023-06-09 DIAGNOSIS — Z8673 Personal history of transient ischemic attack (TIA), and cerebral infarction without residual deficits: Secondary | ICD-10-CM | POA: Diagnosis not present

## 2023-06-09 DIAGNOSIS — E538 Deficiency of other specified B group vitamins: Secondary | ICD-10-CM | POA: Diagnosis not present

## 2023-06-09 DIAGNOSIS — Z79899 Other long term (current) drug therapy: Secondary | ICD-10-CM | POA: Diagnosis not present

## 2023-06-09 DIAGNOSIS — Z801 Family history of malignant neoplasm of trachea, bronchus and lung: Secondary | ICD-10-CM | POA: Insufficient documentation

## 2023-06-09 DIAGNOSIS — E785 Hyperlipidemia, unspecified: Secondary | ICD-10-CM | POA: Insufficient documentation

## 2023-06-09 DIAGNOSIS — Z7985 Long-term (current) use of injectable non-insulin antidiabetic drugs: Secondary | ICD-10-CM | POA: Insufficient documentation

## 2023-06-09 DIAGNOSIS — E119 Type 2 diabetes mellitus without complications: Secondary | ICD-10-CM | POA: Insufficient documentation

## 2023-06-09 LAB — CBC WITH DIFFERENTIAL/PLATELET
Abs Immature Granulocytes: 0.03 10*3/uL (ref 0.00–0.07)
Basophils Absolute: 0.1 10*3/uL (ref 0.0–0.1)
Basophils Relative: 1 %
Eosinophils Absolute: 0.3 10*3/uL (ref 0.0–0.5)
Eosinophils Relative: 3 %
HCT: 48.7 % — ABNORMAL HIGH (ref 36.0–46.0)
Hemoglobin: 15.9 g/dL — ABNORMAL HIGH (ref 12.0–15.0)
Immature Granulocytes: 0 %
Lymphocytes Relative: 29 %
Lymphs Abs: 2.5 10*3/uL (ref 0.7–4.0)
MCH: 29.8 pg (ref 26.0–34.0)
MCHC: 32.6 g/dL (ref 30.0–36.0)
MCV: 91.2 fL (ref 80.0–100.0)
Monocytes Absolute: 0.7 10*3/uL (ref 0.1–1.0)
Monocytes Relative: 8 %
Neutro Abs: 5 10*3/uL (ref 1.7–7.7)
Neutrophils Relative %: 59 %
Platelets: 246 10*3/uL (ref 150–400)
RBC: 5.34 MIL/uL — ABNORMAL HIGH (ref 3.87–5.11)
RDW: 13.5 % (ref 11.5–15.5)
WBC: 8.5 10*3/uL (ref 4.0–10.5)
nRBC: 0 % (ref 0.0–0.2)

## 2023-06-09 LAB — VITAMIN B12: Vitamin B-12: 84 pg/mL — ABNORMAL LOW (ref 180–914)

## 2023-06-09 LAB — FOLATE: Folate: 9.9 ng/mL (ref >5.9)

## 2023-06-09 NOTE — Progress Notes (Signed)
[facility]  Telephone:(336) [phone] Fax:(336) [phone]  ID: Kristin Mora OB: [birth year]  MR#: [id number]  YQM#:[id number]  Patient Care Team: Jacky Kindle, FNP as PCP - General (Family Medicine) Debbe Odea, MD as PCP - Cardiology (Cardiology) Domingo Madeira, [place] (Optometry)  REFERRING PROVIDER: Merita Norton, FNP  REASON FOR REFERRAL: Vitamin B12 deficiency  HPI: Kristin Mora is a 67 y.o. female with past medical history of asthma, brain aneurysm, diabetes, hypertension, hyperlipidemia, stroke referred to hematology for management of severe vitamin B12 deficiency.  Patient was seen by primary care on 05/17/2023.  Concern for cognitive impairment was expressed by the family.  As a part of workup, vitamin B12 was checked which was less than 50.  No prior vitamin B12 level.  Folate 6.7.  Denies any neuropathy.  Denies any prior gastric surgeries or small bowel surgery.  Denies any shortness of breath, chest pain.  CBC showed WBC 10, hemoglobin 14.6 and platelet 266.  REVIEW OF SYSTEMS:   ROS  As per HPI. Otherwise, a complete review of systems is negative.  PAST MEDICAL HISTORY: Past Medical History:  Diagnosis Date   Asthma    Brain aneurysm 04/2007   Diabetes mellitus without complication (HCC)    controlled;   HLD (hyperlipidemia)    Hypertension    controlled with medication   Stroke [facility])    Vertigo     PAST SURGICAL HISTORY: Past Surgical History:  Procedure Laterality Date   COLONOSCOPY WITH PROPOFOL N/A 01/19/2022   Procedure: COLONOSCOPY WITH PROPOFOL;  Surgeon: Wyline Mood, MD;  Location: [facility] ENDOSCOPY;  Service: Gastroenterology;  Laterality: N/A;   CRANIOTOMY  04/28/2007   KNEE SURGERY     TONSILLECTOMY     TONSILLECTOMY AND ADENOIDECTOMY     TUBAL LIGATION      FAMILY HISTORY: Family History  Problem Relation Age of Onset   Heart attack Mother    Coronary artery disease Mother    Lung cancer Father    Diabetes Sister     Heart Problems Sister    Uterine cancer Sister    Lung cancer Sister    Stroke Brother    Bone cancer Brother     HEALTH MAINTENANCE: Social History   Tobacco Use   Smoking status: Former    Current packs/day: 0.00    Average packs/day: 2.0 packs/day for 12.0 years (24.0 ttl pk-yrs)    Types: Cigarettes    Start date: 11/25/1997    Quit date: 11/25/2009    Years since quitting: 13.5   Smokeless tobacco: Never  Vaping Use   Vaping status: Never Used  Substance Use Topics   Alcohol use: No    Alcohol/week: 0.0 standard drinks of alcohol   Drug use: No     Allergies  Allergen Reactions   Lisinopril Swelling   Latex Rash    Current Outpatient Medications  Medication Sig Dispense Refill   amLODipine (NORVASC) 10 MG tablet Take 1 tablet by mouth once daily 90 tablet 0   atorvastatin (LIPITOR) 80 MG tablet Take 1 tablet by mouth once daily 90 tablet 0   Blood Pressure Monitoring (BLOOD PRESSURE DIGITAL SOLN) KIT Dispense one automatic/digital blood pressure machine; check blood pressure twice daily in AM and PM 1 kit 0   ketoconazole (NIZORAL) 2 % cream Apply 1 Application topically daily. On R armpit, under R breast 30 g 2   losartan-hydrochlorothiazide (HYZAAR) 100-25 MG tablet Take 1 tablet by mouth daily. 90 tablet 1  metFORMIN (GLUCOPHAGE) 500 MG tablet TAKE 1 TABLET BY MOUTH TWICE DAILY WITH A MEAL 180 tablet 0   metoprolol succinate (TOPROL XL) 25 MG 24 hr tablet Take 1 tablet (25 mg total) by mouth daily. 30 tablet 0   Miconazole-Zinc Oxide-Petrolat 0.25-15-81.35 % OINT Apply to affected skin twice a day until clear 50 g 2   OZEMPIC, 2 MG/DOSE, 8 MG/3ML SOPN INJECT 1 DOSE (2 MG) SUBCUTANEOUSLY ONCE A WEEK 9 mL 0   terbinafine (LAMISIL) 1 % cream Apply 1 Application topically 2 (two) times daily. To affected areas of rash under breast and under arms 30 g 2   triamcinolone (KENALOG) 0.025 % ointment Apply 1 Application topically 2 (two) times daily. 30 g 0   Vitamin D,  Ergocalciferol, (DRISDOL) 1.25 MG (50000 UNIT) CAPS capsule Take 1 capsule by mouth once a week 13 capsule 0   donepezil (ARICEPT ODT) 5 MG disintegrating tablet Take 1 tablet (5 mg total) by mouth at bedtime. (Patient not taking: Reported on 06/09/2023) 30 tablet 11   hydrocortisone 1 % lotion Apply 1 Application topically 2 (two) times daily. For itching (Patient not taking: Reported on 06/09/2023) 118 mL 1   lactulose (CHRONULAC) 10 GM/15ML solution Take 15 mLs (10 g total) by mouth 2 (two) times daily as needed for severe constipation. (Patient not taking: Reported on 06/09/2023) 236 mL 0   lidocaine (LIDODERM) 5 % Place 1 patch onto the skin daily. Remove & Discard patch within 12 hours or as directed by MD (Patient not taking: Reported on 06/09/2023) 30 patch 0   memantine (NAMENDA XR) 7 MG CP24 24 hr capsule Take 1 capsule (7 mg total) by mouth daily. (Patient not taking: Reported on 06/09/2023) 30 capsule 11   oxybutynin (DITROPAN) 5 MG tablet Take 1 tablet (5 mg total) by mouth 2 (two) times daily. (Patient not taking: Reported on 06/09/2023) 60 tablet 1   No current facility-administered medications for this visit.    OBJECTIVE: Vitals:   06/09/23 1105  BP: (!) 160/75  Pulse: 68  Resp: 16  Temp: (!) 96 F (35.6 C)  SpO2: 100%     Body mass index is 41.34 kg/m.      General: Well-developed, well-nourished, no acute distress. Eyes: Pink conjunctiva, anicteric sclera. HEENT: Normocephalic, moist mucous membranes, clear oropharnyx. Lungs: Clear to auscultation bilaterally. Heart: Regular rate and rhythm. No rubs, murmurs, or gallops. Abdomen: Soft, nontender, nondistended. No organomegaly noted, normoactive bowel sounds. Musculoskeletal: No edema, cyanosis, or clubbing. Neuro: Alert, answering all questions appropriately. Cranial nerves grossly intact. Skin: No rashes or petechiae noted. Psych: Normal affect. Lymphatics: No cervical, calvicular, axillary or inguinal  LAD.   LAB RESULTS:  Lab Results  Component Value Date   NA 141 05/17/2023   K 4.4 05/17/2023   CL 101 05/17/2023   CO2 25 05/17/2023   GLUCOSE 83 05/17/2023   BUN 15 05/17/2023   CREATININE 0.79 05/17/2023   CALCIUM 9.8 05/17/2023   PROT 6.7 05/17/2023   ALBUMIN 3.6 (L) 05/17/2023   AST 15 05/17/2023   ALT 9 05/17/2023   ALKPHOS 65 05/17/2023   BILITOT 0.7 05/17/2023   GFRNONAA >60 05/09/2023   GFRAA 95 07/27/2020    Lab Results  Component Value Date   WBC 8.5 06/09/2023   NEUTROABS 5.0 06/09/2023   HGB 15.9 (H) 06/09/2023   HCT 48.7 (H) 06/09/2023   MCV 91.2 06/09/2023   PLT 246 06/09/2023    No results found for: "TIBC", "FERRITIN", "IRONPCTSAT"  STUDIES: No results found.  ASSESSMENT AND PLAN:   Kristin Mora is a 67 y.o. female with pmh of asthma, brain aneurysm, diabetes, hypertension, hyperlipidemia, stroke referred to hematology for management of severe vitamin B12 deficiency.  # Severe vitamin B12 deficiency -Potential consideration include dietary, pernicious anemia. -Levels were checked due to concern for dementia.  B12 less than 50. -Discussed about IM B12 injection 1000 mcg weekly x 4 and then monthly.  Recheck B12 in 1 month.  Can also start on vitamin B12 oral supplements 1000 mcg once daily over-the-counter. -I will check intrinsic factor antibody and antiparietal cell to rule out any autoimmune process.  Denies any neuropathy.  No anemia or leukopenia.  Orders Placed This Encounter  Procedures   CBC with Differential/Platelet   Vitamin B12   Folate   Intrinsic Factor Antibodies   Anti-parietal antibody   Vitamin B12   CBC with Differential (Cancer Center Only)   Vitamin B12    RTC in 3 months for MD visit, labs, B12 injection  Patient expressed understanding and was in agreement with this plan. She also understands that She can call clinic at any time with any questions, concerns, or complaints.   I spent a total of 45 minutes  reviewing chart data, face-to-face evaluation with the patient, counseling and coordination of care as detailed above.  Michaelyn Barter, MD   06/09/2023 12:29 PM

## 2023-06-09 NOTE — Progress Notes (Signed)
New patient referred for vitamin B12 deficiency.

## 2023-06-09 NOTE — Patient Instructions (Signed)
Vitamin b12 1000 mcg once daily. Over the counter.

## 2023-06-12 LAB — ANTI-PARIETAL ANTIBODY: Parietal Cell Antibody-IgG: 15.5 U (ref 0.0–20.0)

## 2023-06-12 LAB — INTRINSIC FACTOR ANTIBODIES: Intrinsic Factor: 1 [AU]/ml (ref 0.0–1.1)

## 2023-06-16 ENCOUNTER — Ambulatory Visit: Payer: 59 | Admitting: Family Medicine

## 2023-06-16 ENCOUNTER — Inpatient Hospital Stay: Payer: 59

## 2023-06-16 ENCOUNTER — Encounter: Payer: Self-pay | Admitting: Family Medicine

## 2023-06-16 VITALS — BP 155/79 | HR 60 | Ht 62.0 in | Wt 224.0 lb

## 2023-06-16 DIAGNOSIS — E538 Deficiency of other specified B group vitamins: Secondary | ICD-10-CM

## 2023-06-16 DIAGNOSIS — I152 Hypertension secondary to endocrine disorders: Secondary | ICD-10-CM | POA: Diagnosis not present

## 2023-06-16 DIAGNOSIS — E1159 Type 2 diabetes mellitus with other circulatory complications: Secondary | ICD-10-CM

## 2023-06-16 MED ORDER — CYANOCOBALAMIN 1000 MCG/ML IJ SOLN
1000.0000 ug | Freq: Once | INTRAMUSCULAR | Status: AC
  Administered 2023-06-16: 1000 ug via INTRAMUSCULAR
  Filled 2023-06-16: qty 1

## 2023-06-16 NOTE — Assessment & Plan Note (Signed)
Chronic, slight improvement since starting metop 25; consider change to coreg if desired Family wants to continue to trial b-12 supplementation to assist chronic fatigue and concerns for memory loss at this time Continue on norvasc 10 mg, hyzaar 100-25, toprol 25

## 2023-06-16 NOTE — Progress Notes (Signed)
Established patient visit   Patient: Kristin Mora   DOB: 1955-12-10   67 y.o. Female  MRN: [id number] Visit Date: 06/16/2023  Today's healthcare provider: Jacky Kindle, FNP  Introduced to nurse practitioner role and practice setting.  All questions answered.  Discussed provider/patient relationship and expectations.  Chief Complaint  Patient presents with   Follow-up    HTN-f/u   Subjective    HPI HPI     Follow-up    Additional comments: HTN-f/u      Last edited by Shelly Bombard, CMA on 06/16/2023  3:14 PM.      Medications: Outpatient Medications Prior to Visit  Medication Sig   amLODipine (NORVASC) 10 MG tablet Take 1 tablet by mouth once daily   atorvastatin (LIPITOR) 80 MG tablet Take 1 tablet by mouth once daily   Blood Pressure Monitoring (BLOOD PRESSURE DIGITAL SOLN) KIT Dispense one automatic/digital blood pressure machine; check blood pressure twice daily in AM and PM   hydrocortisone 1 % lotion Apply 1 Application topically 2 (two) times daily. For itching   ketoconazole (NIZORAL) 2 % cream Apply 1 Application topically daily. On R armpit, under R breast   lactulose (CHRONULAC) 10 GM/15ML solution Take 15 mLs (10 g total) by mouth 2 (two) times daily as needed for severe constipation.   lidocaine (LIDODERM) 5 % Place 1 patch onto the skin daily. Remove & Discard patch within 12 hours or as directed by MD   losartan-hydrochlorothiazide (HYZAAR) 100-25 MG tablet Take 1 tablet by mouth daily.   metFORMIN (GLUCOPHAGE) 500 MG tablet TAKE 1 TABLET BY MOUTH TWICE DAILY WITH A MEAL   metoprolol succinate (TOPROL XL) 25 MG 24 hr tablet Take 1 tablet (25 mg total) by mouth daily.   Miconazole-Zinc Oxide-Petrolat 0.25-15-81.35 % OINT Apply to affected skin twice a day until clear   OZEMPIC, 2 MG/DOSE, 8 MG/3ML SOPN INJECT 1 DOSE (2 MG) SUBCUTANEOUSLY ONCE A WEEK   terbinafine (LAMISIL) 1 % cream Apply 1 Application topically 2 (two) times daily. To affected areas  of rash under breast and under arms   triamcinolone (KENALOG) 0.025 % ointment Apply 1 Application topically 2 (two) times daily.   Vitamin D, Ergocalciferol, (DRISDOL) 1.25 MG (50000 UNIT) CAPS capsule Take 1 capsule by mouth once a week   donepezil (ARICEPT ODT) 5 MG disintegrating tablet Take 1 tablet (5 mg total) by mouth at bedtime. (Patient not taking: Reported on 06/16/2023)   memantine (NAMENDA XR) 7 MG CP24 24 hr capsule Take 1 capsule (7 mg total) by mouth daily. (Patient not taking: Reported on 06/16/2023)   oxybutynin (DITROPAN) 5 MG tablet Take 1 tablet (5 mg total) by mouth 2 (two) times daily. (Patient not taking: Reported on 06/16/2023)   No facility-administered medications prior to visit.   Last CBC Lab Results  Component Value Date   WBC 8.5 06/09/2023   HGB 15.9 (H) 06/09/2023   HCT 48.7 (H) 06/09/2023   MCV 91.2 06/09/2023   MCH 29.8 06/09/2023   RDW 13.5 06/09/2023   PLT 246 06/09/2023   Last metabolic panel Lab Results  Component Value Date   GLUCOSE 83 05/17/2023   NA 141 05/17/2023   K 4.4 05/17/2023   CL 101 05/17/2023   CO2 25 05/17/2023   BUN 15 05/17/2023   CREATININE 0.79 05/17/2023   EGFR 81 05/17/2023   CALCIUM 9.8 05/17/2023   PROT 6.7 05/17/2023   ALBUMIN 3.6 (L) 05/17/2023   LABGLOB  3.1 05/17/2023   AGRATIO 1.3 07/08/2022   BILITOT 0.7 05/17/2023   ALKPHOS 65 05/17/2023   AST 15 05/17/2023   ALT 9 05/17/2023   ANIONGAP 9 05/09/2023   Last lipids Lab Results  Component Value Date   CHOL 121 05/17/2023   HDL 36 (L) 05/17/2023   LDLCALC 66 05/17/2023   TRIG 103 05/17/2023   CHOLHDL 3.4 05/17/2023   Last hemoglobin A1c Lab Results  Component Value Date   HGBA1C 5.8 (H) 05/17/2023   Last thyroid functions Lab Results  Component Value Date   TSH 1.200 05/17/2023   Last vitamin D Lab Results  Component Value Date   VD25OH 35.6 05/17/2023   Last vitamin B12 and Folate Lab Results  Component Value Date   VITAMINB12 84 (L)  06/09/2023   FOLATE 9.9 06/09/2023     Objective    BP (!) 155/79 (BP Location: Left Arm, Patient Position: Sitting)   Pulse 60   Ht 5\' 2"  (1.575 m)   Wt 224 lb (101.6 kg)   BMI 40.97 kg/m   BP Readings from Last 3 Encounters:  06/16/23 (!) 155/79  06/09/23 (!) 160/75  05/17/23 (!) 161/61   Wt Readings from Last 3 Encounters:  06/16/23 224 lb (101.6 kg)  06/09/23 226 lb (102.5 kg)  05/17/23 223 lb 6.4 oz (101.3 kg)   SpO2 Readings from Last 3 Encounters:  06/09/23 100%  05/17/23 99%  05/09/23 96%   Physical Exam Vitals and nursing note reviewed.  Constitutional:      General: She is not in acute distress.    Appearance: Normal appearance. She is obese. She is not ill-appearing, toxic-appearing or diaphoretic.  HENT:     Head: Normocephalic and atraumatic.  Cardiovascular:     Rate and Rhythm: Normal rate and regular rhythm.     Pulses: Normal pulses.     Heart sounds: Normal heart sounds. No murmur heard.    No friction rub. No gallop.     Comments: Denies fatigue following start of metop; reports increased activity Pulmonary:     Effort: Pulmonary effort is normal. No respiratory distress.     Breath sounds: Normal breath sounds. No stridor. No wheezing, rhonchi or rales.  Chest:     Chest wall: No tenderness.  Musculoskeletal:        General: No swelling, tenderness, deformity or signs of injury. Normal range of motion.     Right lower leg: No edema.     Left lower leg: No edema.  Skin:    General: Skin is warm and dry.     Capillary Refill: Capillary refill takes less than 2 seconds.     Coloration: Skin is not jaundiced or pale.     Findings: No bruising, erythema, lesion or rash.  Neurological:     General: No focal deficit present.     Mental Status: She is alert and oriented to person, place, and time. Mental status is at baseline.     Cranial Nerves: No cranial nerve deficit.     Sensory: No sensory deficit.     Motor: Weakness present.      Coordination: Coordination normal.     Comments: Chronic use of cane for pt request  Psychiatric:        Mood and Affect: Mood normal.        Behavior: Behavior normal.        Thought Content: Thought content normal.        Judgment: Judgment normal.  No results found for any visits on 06/16/23.  Assessment & Plan     Problem List Items Addressed This Visit       Cardiovascular and Mediastinum   Hypertension associated with diabetes (HCC)   Chronic, slight improvement since starting metop 25; consider change to coreg if desired Family wants to continue to trial b-12 supplementation to assist chronic fatigue and concerns for memory loss at this time Continue on norvasc 10 mg, hyzaar 100-25, toprol 25        Other   B12 deficiency - Primary   Followed by hem; defer labs to speciality team      No follow-ups on file.     Leilani Merl, FNP, have reviewed all documentation for this visit. The documentation on 06/16/23 for the exam, diagnosis, procedures, and orders are all accurate and complete.  Jacky Kindle, FNP  [facility] [phone] (phone) [phone] (fax)  [facility]

## 2023-06-16 NOTE — Assessment & Plan Note (Signed)
Followed by hem; defer labs to speciality team

## 2023-06-17 ENCOUNTER — Other Ambulatory Visit: Payer: Self-pay | Admitting: Family Medicine

## 2023-06-17 DIAGNOSIS — E119 Type 2 diabetes mellitus without complications: Secondary | ICD-10-CM

## 2023-06-23 ENCOUNTER — Inpatient Hospital Stay: Payer: 59

## 2023-06-23 DIAGNOSIS — E538 Deficiency of other specified B group vitamins: Secondary | ICD-10-CM | POA: Diagnosis not present

## 2023-06-23 MED ORDER — CYANOCOBALAMIN 1000 MCG/ML IJ SOLN
1000.0000 ug | Freq: Once | INTRAMUSCULAR | Status: AC
  Administered 2023-06-23: 1000 ug via INTRAMUSCULAR
  Filled 2023-06-23: qty 1

## 2023-06-30 ENCOUNTER — Inpatient Hospital Stay: Payer: 59 | Attending: Internal Medicine

## 2023-06-30 DIAGNOSIS — E538 Deficiency of other specified B group vitamins: Secondary | ICD-10-CM | POA: Insufficient documentation

## 2023-06-30 DIAGNOSIS — Z79899 Other long term (current) drug therapy: Secondary | ICD-10-CM | POA: Diagnosis not present

## 2023-06-30 MED ORDER — CYANOCOBALAMIN 1000 MCG/ML IJ SOLN
1000.0000 ug | Freq: Once | INTRAMUSCULAR | Status: AC
  Administered 2023-06-30: 1000 ug via INTRAMUSCULAR
  Filled 2023-06-30: qty 1

## 2023-07-03 ENCOUNTER — Other Ambulatory Visit: Payer: Self-pay | Admitting: Family Medicine

## 2023-07-03 ENCOUNTER — Other Ambulatory Visit: Payer: Self-pay | Admitting: Physician Assistant

## 2023-07-03 DIAGNOSIS — E559 Vitamin D deficiency, unspecified: Secondary | ICD-10-CM

## 2023-07-03 NOTE — Telephone Encounter (Signed)
 Medication Refill -  Most Recent Primary Care Visit:  Provider: PAYNE, ELISE T  Department: [facility]  Visit Type: OFFICE VISIT  Date: 06/16/2023  Medication: OZEMPIC , 2 MG/DOSE, 8 MG/3ML SOPN   Has the patient contacted their pharmacy? Yes  Is this the correct pharmacy for this prescription? Yes  This is the patient's preferred pharmacy:  [facility] [address] ([address]), [place] - [address] [place]) [place] [address] Phone: [phone] Fax: [phone]  Has the prescription been filled recently? Yes  Is the patient out of the medication? Yes  Has the patient been seen for an appointment in the last year OR does the patient have an upcoming appointment? Yes  Can we respond through MyChart? No  Agent: Please be advised that Rx refills may take up to 3 business days. We ask that you follow-up with your pharmacy.

## 2023-07-05 MED ORDER — OZEMPIC (2 MG/DOSE) 8 MG/3ML ~~LOC~~ SOPN: 2.0000 mg | PEN_INJECTOR | SUBCUTANEOUS | 0 refills | Status: DC

## 2023-07-05 NOTE — Telephone Encounter (Signed)
 Requested Prescriptions  Pending Prescriptions Disp Refills   Semaglutide , 2 MG/DOSE, (OZEMPIC , 2 MG/DOSE,) 8 MG/3ML SOPN 9 mL 0    Sig: Inject 2 mg into the skin once a week for 12 doses.     Endocrinology:  Diabetes - GLP-1 Receptor Agonists - semaglutide  Failed - 07/05/2023  3:42 PM      Failed - HBA1C in normal range and within 180 days    Hemoglobin A1C  Date Value Ref Range Status  06/29/2016 6.5  Final   Hgb A1c MFr Bld  Date Value Ref Range Status  05/17/2023 5.8 (H) 4.8 - 5.6 % Final    Comment:             Prediabetes: 5.7 - 6.4          Diabetes: >6.4          Glycemic control for adults with diabetes: <7.0          Passed - Cr in normal range and within 360 days    Creatinine  Date Value Ref Range Status  10/01/2014 0.81 mg/dL Final    Comment:    9.55-8.99 NOTE: New Reference Range  09/02/14    Creatinine, Ser  Date Value Ref Range Status  05/17/2023 0.79 0.57 - 1.00 mg/dL Final         Passed - Valid encounter within last 6 months    Recent Outpatient Visits           2 weeks ago B12 deficiency   [facility] Emilio Kelly DASEN, FNP   1 month ago Hospital discharge follow-up   [facility] Emilio Kelly T, FNP   6 months ago Hypertension associated with diabetes [facility])   [place] [facility] Cyndi Shaver, PA-C   7 months ago Left hip pain   [place] Cyndi Shaver, PA-C   8 months ago Hypertension associated with diabetes [facility])    [facility] Cyndi Shaver, PA-C       Future Appointments             In 1 month Simmons-Robinson, [place], MD [facility], [facility]

## 2023-07-07 ENCOUNTER — Inpatient Hospital Stay: Payer: 59

## 2023-07-14 ENCOUNTER — Inpatient Hospital Stay: Payer: 59

## 2023-07-14 DIAGNOSIS — E538 Deficiency of other specified B group vitamins: Secondary | ICD-10-CM

## 2023-07-14 LAB — VITAMIN B12: Vitamin B-12: 276 pg/mL (ref 180–914)

## 2023-07-14 MED ORDER — CYANOCOBALAMIN 1000 MCG/ML IJ SOLN
1000.0000 ug | Freq: Once | INTRAMUSCULAR | Status: AC
  Administered 2023-07-14: 1000 ug via INTRAMUSCULAR
  Filled 2023-07-14: qty 1

## 2023-07-18 ENCOUNTER — Other Ambulatory Visit: Payer: Self-pay | Admitting: *Deleted

## 2023-07-18 ENCOUNTER — Telehealth: Payer: Self-pay | Admitting: Family Medicine

## 2023-07-18 MED ORDER — METOPROLOL SUCCINATE ER 25 MG PO TB24: 25.0000 mg | ORAL_TABLET | Freq: Every day | ORAL | 3 refills | Status: DC

## 2023-07-18 MED ORDER — METOPROLOL SUCCINATE ER 25 MG PO TB24: 25.0000 mg | ORAL_TABLET | Freq: Every day | ORAL | 0 refills | Status: DC

## 2023-07-18 NOTE — Telephone Encounter (Signed)
Sent metoprolol 25 mg-[facility]

## 2023-07-18 NOTE — Telephone Encounter (Signed)
[facility] faxed refill request for the following medications:  metoprolol succinate (TOPROL-XL) 25 MG 24 hr tablet    Please advise.

## 2023-08-18 ENCOUNTER — Ambulatory Visit: Payer: 59 | Admitting: Family Medicine

## 2023-08-18 NOTE — Progress Notes (Deleted)
 Established patient visit   Patient: Kristin Mora   DOB: [birth year]   69 y.o. Female  MRN: [id number] Visit Date: 08/18/2023  Today's healthcare provider: Ronnald Ramp, MD   No chief complaint on file.  Subjective       Discussed the use of AI scribe software for clinical note transcription with the patient, who gave verbal consent to proceed.  History of Present Illness             Past Medical History:  Diagnosis Date   Asthma    Brain aneurysm 04/2007   Diabetes mellitus without complication (HCC)    controlled;   HLD (hyperlipidemia)    Hypertension    controlled with medication   Stroke (HCC)    Vertigo     Medications: Outpatient Medications Prior to Visit  Medication Sig   amLODipine (NORVASC) 10 MG tablet Take 1 tablet by mouth once daily   atorvastatin (LIPITOR) 80 MG tablet Take 1 tablet by mouth once daily   Blood Pressure Monitoring (BLOOD PRESSURE DIGITAL SOLN) KIT Dispense one automatic/digital blood pressure machine; check blood pressure twice daily in AM and PM   donepezil (ARICEPT ODT) 5 MG disintegrating tablet Take 1 tablet (5 mg total) by mouth at bedtime. (Patient not taking: Reported on 06/16/2023)   hydrocortisone 1 % lotion Apply 1 Application topically 2 (two) times daily. For itching   ketoconazole (NIZORAL) 2 % cream Apply 1 Application topically daily. On R armpit, under R breast   lactulose (CHRONULAC) 10 GM/15ML solution Take 15 mLs (10 g total) by mouth 2 (two) times daily as needed for severe constipation.   lidocaine (LIDODERM) 5 % Place 1 patch onto the skin daily. Remove & Discard patch within 12 hours or as directed by MD   losartan-hydrochlorothiazide (HYZAAR) 100-25 MG tablet Take 1 tablet by mouth daily.   memantine (NAMENDA XR) 7 MG CP24 24 hr capsule Take 1 capsule (7 mg total) by mouth daily. (Patient not taking: Reported on 06/16/2023)   metFORMIN (GLUCOPHAGE) 500 MG tablet TAKE 1 TABLET BY MOUTH TWICE  DAILY WITH A MEAL   metoprolol succinate (TOPROL-XL) 25 MG 24 hr tablet Take 1 tablet (25 mg total) by mouth daily.   Miconazole-Zinc Oxide-Petrolat 0.25-15-81.35 % OINT Apply to affected skin twice a day until clear   oxybutynin (DITROPAN) 5 MG tablet Take 1 tablet (5 mg total) by mouth 2 (two) times daily. (Patient not taking: Reported on 06/16/2023)   Semaglutide, 2 MG/DOSE, (OZEMPIC, 2 MG/DOSE,) 8 MG/3ML SOPN Inject 2 mg into the skin once a week for 12 doses.   terbinafine (LAMISIL) 1 % cream Apply 1 Application topically 2 (two) times daily. To affected areas of rash under breast and under arms   triamcinolone (KENALOG) 0.025 % ointment Apply 1 Application topically 2 (two) times daily.   Vitamin D, Ergocalciferol, (DRISDOL) 1.25 MG (50000 UNIT) CAPS capsule Take 1 capsule by mouth once a week   No facility-administered medications prior to visit.    Review of Systems  {Insert previous labs (optional):23779} {See past labs  Heme  Chem  Endocrine  Serology  Results Review (optional):1}   Objective    There were no vitals taken for this visit. {Insert last BP/Wt (optional):23777}{See vitals history (optional):1}    Physical Exam  ***  No results found for any visits on 08/18/23.  Assessment & Plan     Problem List Items Addressed This Visit   None   Assessment  and Plan              No follow-ups on file.         Ronnald Ramp, MD  [facility] [phone] (phone) [phone] (fax)  [facility]

## 2023-09-08 ENCOUNTER — Inpatient Hospital Stay: Payer: 59

## 2023-09-08 ENCOUNTER — Ambulatory Visit

## 2023-09-08 ENCOUNTER — Inpatient Hospital Stay: Payer: 59 | Attending: Internal Medicine

## 2023-09-08 ENCOUNTER — Encounter: Payer: Self-pay | Admitting: Internal Medicine

## 2023-09-08 ENCOUNTER — Other Ambulatory Visit: Payer: Self-pay | Admitting: Family Medicine

## 2023-09-08 ENCOUNTER — Inpatient Hospital Stay (HOSPITAL_BASED_OUTPATIENT_CLINIC_OR_DEPARTMENT_OTHER): Payer: 59 | Admitting: Internal Medicine

## 2023-09-08 VITALS — BP 150/69 | HR 80 | Temp 97.3°F | Resp 14 | Wt 228.0 lb

## 2023-09-08 DIAGNOSIS — E119 Type 2 diabetes mellitus without complications: Secondary | ICD-10-CM | POA: Diagnosis not present

## 2023-09-08 DIAGNOSIS — Z7985 Long-term (current) use of injectable non-insulin antidiabetic drugs: Secondary | ICD-10-CM | POA: Insufficient documentation

## 2023-09-08 DIAGNOSIS — I10 Essential (primary) hypertension: Secondary | ICD-10-CM | POA: Diagnosis not present

## 2023-09-08 DIAGNOSIS — Z8673 Personal history of transient ischemic attack (TIA), and cerebral infarction without residual deficits: Secondary | ICD-10-CM | POA: Diagnosis not present

## 2023-09-08 DIAGNOSIS — E538 Deficiency of other specified B group vitamins: Secondary | ICD-10-CM

## 2023-09-08 DIAGNOSIS — J45909 Unspecified asthma, uncomplicated: Secondary | ICD-10-CM | POA: Insufficient documentation

## 2023-09-08 DIAGNOSIS — Z801 Family history of malignant neoplasm of trachea, bronchus and lung: Secondary | ICD-10-CM | POA: Insufficient documentation

## 2023-09-08 DIAGNOSIS — Z79899 Other long term (current) drug therapy: Secondary | ICD-10-CM | POA: Diagnosis not present

## 2023-09-08 DIAGNOSIS — Z7984 Long term (current) use of oral hypoglycemic drugs: Secondary | ICD-10-CM | POA: Insufficient documentation

## 2023-09-08 DIAGNOSIS — E785 Hyperlipidemia, unspecified: Secondary | ICD-10-CM | POA: Insufficient documentation

## 2023-09-08 DIAGNOSIS — Z87891 Personal history of nicotine dependence: Secondary | ICD-10-CM | POA: Insufficient documentation

## 2023-09-08 DIAGNOSIS — E559 Vitamin D deficiency, unspecified: Secondary | ICD-10-CM

## 2023-09-08 LAB — CBC WITH DIFFERENTIAL (CANCER CENTER ONLY)
Abs Immature Granulocytes: 0.04 10*3/uL (ref 0.00–0.07)
Basophils Absolute: 0 10*3/uL (ref 0.0–0.1)
Basophils Relative: 0 %
Eosinophils Absolute: 0.3 10*3/uL (ref 0.0–0.5)
Eosinophils Relative: 3 %
HCT: 42.6 % (ref 36.0–46.0)
Hemoglobin: 14 g/dL (ref 12.0–15.0)
Immature Granulocytes: 0 %
Lymphocytes Relative: 26 %
Lymphs Abs: 2.6 10*3/uL (ref 0.7–4.0)
MCH: 29.7 pg (ref 26.0–34.0)
MCHC: 32.9 g/dL (ref 30.0–36.0)
MCV: 90.3 fL (ref 80.0–100.0)
Monocytes Absolute: 0.7 10*3/uL (ref 0.1–1.0)
Monocytes Relative: 7 %
Neutro Abs: 6.2 10*3/uL (ref 1.7–7.7)
Neutrophils Relative %: 64 %
Platelet Count: 231 10*3/uL (ref 150–400)
RBC: 4.72 MIL/uL (ref 3.87–5.11)
RDW: 14.8 % (ref 11.5–15.5)
WBC Count: 9.9 10*3/uL (ref 4.0–10.5)
nRBC: 0 % (ref 0.0–0.2)

## 2023-09-08 LAB — VITAMIN B12: Vitamin B-12: 105 pg/mL — ABNORMAL LOW (ref 180–914)

## 2023-09-08 MED ORDER — CYANOCOBALAMIN 1000 MCG/ML IJ SOLN
1000.0000 ug | Freq: Once | INTRAMUSCULAR | Status: AC
Start: 2023-09-08 — End: 2023-09-08
  Administered 2023-09-08: 1000 ug via INTRAMUSCULAR
  Filled 2023-09-08: qty 1

## 2023-09-08 NOTE — Telephone Encounter (Signed)
 Last labs of vitamin D was 3 mo ago 05/17/2023

## 2023-09-08 NOTE — Telephone Encounter (Signed)
 Copied from CRM [id number]. Topic: Clinical - Medication Refill >> Sep 08, 2023  1:46 PM Izetta Dakin wrote: Most Recent Primary Care Visit:  Provider: Merita Norton T  Department: [facility]  Visit Type: OFFICE VISIT  Date: 06/16/2023  Medication: Vitamin D, Ergocalciferol, (DRISDOL) 1.25 MG (50000 UNIT) CAPS capsule  Has the patient contacted their pharmacy? Yes (Agent: If no, request that the patient contact the pharmacy for the refill. If patient does not wish to contact the pharmacy document the reason why and proceed with request.) (Agent: If yes, when and what did the pharmacy advise?)  Is this the correct pharmacy for this prescription? Yes If no, delete pharmacy and type the correct one.  This is the patient's preferred pharmacy:  [facility] [address] ([address]), [place] - [address] [place]) [place] [address] Phone: [phone] Fax: [phone]     Has the prescription been filled recently? No  Is the patient out of the medication? Yes  Has the patient been seen for an appointment in the last year OR does the patient have an upcoming appointment? Yes  Can we respond through MyChart? Yes  Agent: Please be advised that Rx refills may take up to 3 business days. We ask that you follow-up with your pharmacy.

## 2023-09-08 NOTE — Telephone Encounter (Signed)
 [facility] is requesting refill Vitamin D, Ergocalciferol, (DRISDOL) 1.25 MG (50000 UNIT) CAPS capsule   Please advise

## 2023-09-08 NOTE — Progress Notes (Addendum)
 [facility]  [phone] [phone]  ID: Kristin Mora OB: 1955/11/30  MR#: [id number]  NGE#:[id number]  Patient Care Team: Ronnald Ramp, MD as PCP - General (Family Medicine) Debbe Odea, MD as PCP - Cardiology (Cardiology) Domingo Madeira, [place] (Optometry) Creig Hines, MD as Consulting Physician (Oncology)   Reason for visit: Vitamin B12 deficiency  HPI: Kristin Mora is a 68 y.o. female with past medical history of asthma, brain aneurysm, diabetes, hypertension, hyperlipidemia, stroke referred to hematology for management of severe vitamin B12 deficiency.  Patient was seen by primary care on 05/17/2023.  Concern for cognitive impairment was expressed by the family.  As a part of workup, vitamin B12 was checked which was less than 50.  No prior vitamin B12 level.  Folate 6.7.  Denies any neuropathy.  Denies any prior gastric surgeries or small bowel surgery. Denies gluten insensitivity. Denies any shortness of breath, chest pain.  Interval history-  Patient was seen today as follow-up for vitamin B12 deficiency, labs. Doing well overall.  Denies any concerns.   REVIEW OF SYSTEMS:   ROS  As per HPI. Otherwise, a complete review of systems is negative.  PAST MEDICAL HISTORY: Past Medical History:  Diagnosis Date   Asthma    Brain aneurysm 04/2007   Diabetes mellitus without complication (HCC)    controlled;   HLD (hyperlipidemia)    Hypertension    controlled with medication   Stroke [facility])    Vertigo     PAST SURGICAL HISTORY: Past Surgical History:  Procedure Laterality Date   COLONOSCOPY WITH PROPOFOL N/A 01/19/2022   Procedure: COLONOSCOPY WITH PROPOFOL;  Surgeon: Wyline Mood, MD;  Location: [facility] ENDOSCOPY;  Service: Gastroenterology;  Laterality: N/A;   CRANIOTOMY  04/28/2007   KNEE SURGERY     TONSILLECTOMY     TONSILLECTOMY AND ADENOIDECTOMY     TUBAL LIGATION      FAMILY HISTORY: Family  History  Problem Relation Age of Onset   Heart attack Mother    Coronary artery disease Mother    Lung cancer Father    Diabetes Sister    Heart Problems Sister    Uterine cancer Sister    Lung cancer Sister    Stroke Brother    Bone cancer Brother     HEALTH MAINTENANCE: Social History   Tobacco Use   Smoking status: Former    Current packs/day: 0.00    Average packs/day: 2.0 packs/day for 12.0 years (24.0 ttl pk-yrs)    Types: Cigarettes    Start date: 11/25/1997    Quit date: 11/25/2009    Years since quitting: 13.7   Smokeless tobacco: Never  Vaping Use   Vaping status: Never Used  Substance Use Topics   Alcohol use: No    Alcohol/week: 0.0 standard drinks of alcohol   Drug use: No     Allergies  Allergen Reactions   Lisinopril Swelling   Latex Rash    Current Outpatient Medications  Medication Sig Dispense Refill   amLODipine (NORVASC) 10 MG tablet Take 1 tablet by mouth once daily 90 tablet 0   atorvastatin (LIPITOR) 80 MG tablet Take 1 tablet by mouth once daily 90 tablet 0   Blood Pressure Monitoring (BLOOD PRESSURE DIGITAL SOLN) KIT Dispense one automatic/digital blood pressure machine; check blood pressure twice daily in AM and PM 1 kit 0   donepezil (ARICEPT ODT) 5 MG disintegrating tablet Take 1 tablet (5 mg total) by mouth at bedtime. (Patient not  taking: Reported on 06/16/2023) 30 tablet 11   hydrocortisone 1 % lotion Apply 1 Application topically 2 (two) times daily. For itching 118 mL 1   ketoconazole (NIZORAL) 2 % cream Apply 1 Application topically daily. On R armpit, under R breast 30 g 2   lactulose (CHRONULAC) 10 GM/15ML solution Take 15 mLs (10 g total) by mouth 2 (two) times daily as needed for severe constipation. 236 mL 0   lidocaine (LIDODERM) 5 % Place 1 patch onto the skin daily. Remove & Discard patch within 12 hours or as directed by MD 30 patch 0   losartan-hydrochlorothiazide (HYZAAR) 100-25 MG tablet Take 1 tablet by mouth daily. 90  tablet 1   memantine (NAMENDA XR) 7 MG CP24 24 hr capsule Take 1 capsule (7 mg total) by mouth daily. (Patient not taking: Reported on 06/16/2023) 30 capsule 11   metFORMIN (GLUCOPHAGE) 500 MG tablet TAKE 1 TABLET BY MOUTH TWICE DAILY WITH A MEAL 180 tablet 0   metoprolol succinate (TOPROL-XL) 25 MG 24 hr tablet Take 1 tablet (25 mg total) by mouth daily. 90 tablet 3   Miconazole-Zinc Oxide-Petrolat 0.25-15-81.35 % OINT Apply to affected skin twice a day until clear 50 g 2   oxybutynin (DITROPAN) 5 MG tablet Take 1 tablet (5 mg total) by mouth 2 (two) times daily. (Patient not taking: Reported on 06/16/2023) 60 tablet 1   Semaglutide, 2 MG/DOSE, (OZEMPIC, 2 MG/DOSE,) 8 MG/3ML SOPN Inject 2 mg into the skin once a week for 12 doses. 9 mL 0   terbinafine (LAMISIL) 1 % cream Apply 1 Application topically 2 (two) times daily. To affected areas of rash under breast and under arms 30 g 2   triamcinolone (KENALOG) 0.025 % ointment Apply 1 Application topically 2 (two) times daily. 30 g 0   Vitamin D, Ergocalciferol, (DRISDOL) 1.25 MG (50000 UNIT) CAPS capsule Take 1 capsule by mouth once a week 13 capsule 0   No current facility-administered medications for this visit.    OBJECTIVE: Vitals:   09/08/23 1301  BP: (!) 150/69  Pulse: 80  Resp: 14  Temp: (!) 97.3 F (36.3 C)  SpO2: 99%      Body mass index is 41.7 kg/m.      General: Well-developed, well-nourished, no acute distress. Eyes: Pink conjunctiva, anicteric sclera. HEENT: Normocephalic, moist mucous membranes, clear oropharnyx. Lungs: Clear to auscultation bilaterally. Heart: Regular rate and rhythm. No rubs, murmurs, or gallops. Abdomen: Soft, nontender, nondistended. No organomegaly noted, normoactive bowel sounds. Musculoskeletal: No edema, cyanosis, or clubbing. Neuro: Alert, answering all questions appropriately. Cranial nerves grossly intact. Skin: No rashes or petechiae noted. Psych: Normal affect. Lymphatics: No cervical,  calvicular, axillary or inguinal LAD.   LAB RESULTS:  Lab Results  Component Value Date   NA 141 05/17/2023   K 4.4 05/17/2023   CL 101 05/17/2023   CO2 25 05/17/2023   GLUCOSE 83 05/17/2023   BUN 15 05/17/2023   CREATININE 0.79 05/17/2023   CALCIUM 9.8 05/17/2023   PROT 6.7 05/17/2023   ALBUMIN 3.6 (L) 05/17/2023   AST 15 05/17/2023   ALT 9 05/17/2023   ALKPHOS 65 05/17/2023   BILITOT 0.7 05/17/2023   GFRNONAA >60 05/09/2023   GFRAA 95 07/27/2020    Lab Results  Component Value Date   WBC 9.9 09/08/2023   NEUTROABS 6.2 09/08/2023   HGB 14.0 09/08/2023   HCT 42.6 09/08/2023   MCV 90.3 09/08/2023   PLT 231 09/08/2023    No results found  for: "TIBC", "FERRITIN", "IRONPCTSAT"   STUDIES: No results found.  ASSESSMENT AND PLAN:   Kristin Mora is a 68 y.o. female with pmh of asthma, brain aneurysm, diabetes, hypertension, hyperlipidemia, stroke referred to hematology for management of severe vitamin B12 deficiency.  # Severe vitamin B12 deficiency -Likely nutritional.  Intrinsic factor antibody and antiparietal antibodies negative.  Denies gluten sensitivity. Will check celiac panel for completion.  -Continue with B12 supplements 1000 mcg once daily -Completed IM weekly B12 x 4. Start on maintenance B12 inj 1000 mcg monthly.  - Recheck B12 in 4 months, then f/u in 8 months.    Orders Placed This Encounter  Procedures   CBC with Differential (Cancer Center Only)   Vitamin B12   RTC in 8 months for MD visit, labs, B12 injection  Patient expressed understanding and was in agreement with this plan. She also understands that She can call clinic at any time with any questions, concerns, or complaints.   I spent a total of 10 minutes reviewing chart data, face-to-face evaluation with the patient, counseling and coordination of care as detailed above.  Michaelyn Barter, MD   09/08/2023 1:49 PM

## 2023-09-08 NOTE — Patient Instructions (Signed)
 Please start your B12 supplements 1000 mcg once daily

## 2023-09-08 NOTE — Progress Notes (Signed)
 Patient is doing great, no questions or concerns for the doctor today. She is waiting on her daughter karen to get here because she doesn't know what type of medications she is taking.

## 2023-09-11 ENCOUNTER — Telehealth: Payer: Self-pay | Admitting: Family Medicine

## 2023-09-11 DIAGNOSIS — I152 Hypertension secondary to endocrine disorders: Secondary | ICD-10-CM

## 2023-09-11 DIAGNOSIS — E7800 Pure hypercholesterolemia, unspecified: Secondary | ICD-10-CM

## 2023-09-11 MED ORDER — VITAMIN D (ERGOCALCIFEROL) 1.25 MG (50000 UNIT) PO CAPS
50000.0000 [IU] | ORAL_CAPSULE | ORAL | 0 refills | Status: DC
Start: 2023-09-11 — End: 2023-11-21

## 2023-09-11 NOTE — Telephone Encounter (Signed)
 [facility] faxed refill request for the following medications:   atorvastatin (LIPITOR) 80 MG tablet     losartan-hydrochlorothiazide (HYZAAR) 100-25 MG tablet   Please advise.

## 2023-09-11 NOTE — Telephone Encounter (Signed)
 Duplicate request, last refill 09/11/23.  Requested Prescriptions  Pending Prescriptions Disp Refills   Vitamin D, Ergocalciferol, (DRISDOL) 1.25 MG (50000 UNIT) CAPS capsule 13 capsule 0    Sig: Take 1 capsule (50,000 Units total) by mouth once a week.     Endocrinology:  Vitamins - Vitamin D Supplementation 2 Failed - 09/11/2023  9:54 AM      Failed - Manual Review: Route requests for 50,000 IU strength to the provider      Passed - Ca in normal range and within 360 days    Calcium  Date Value Ref Range Status  05/17/2023 9.8 8.7 - 10.3 mg/dL Final   Calcium, Total  Date Value Ref Range Status  10/01/2014 9.5 mg/dL Final    Comment:    5.7-84.6 NOTE: New Reference Range  09/02/14          Passed - Vitamin D in normal range and within 360 days    Vit D, 25-Hydroxy  Date Value Ref Range Status  05/17/2023 35.6 30.0 - 100.0 ng/mL Final    Comment:    Vitamin D deficiency has been defined by the Institute of Medicine and an Endocrine Society practice guideline as a level of serum 25-OH vitamin D less than 20 ng/mL (1,2). The Endocrine Society went on to further define vitamin D insufficiency as a level between 21 and 29 ng/mL (2). 1. IOM (Institute of Medicine). 2010. Dietary reference    intakes for calcium and D. [place]: The    [employer]. 2. Holick MF, [place], Bischoff-Ferrari HA, et al.    Evaluation, treatment, and prevention of vitamin D    deficiency: an Endocrine Society clinical practice    guideline. JCEM. 2011 Jul; 96(7):1911-30.          Passed - Valid encounter within last 12 months    Recent Outpatient Visits           2 months ago B12 deficiency   [facility] Jacky Kindle, FNP   3 months ago Hospital discharge follow-up   [facility] Merita Norton T, FNP   9 months ago Hypertension associated with diabetes [facility])   [facility] Alfredia Ferguson,  PA-C   10 months ago Left hip pain   [facility] Alfredia Ferguson, PA-C   10 months ago Hypertension associated with diabetes [facility])   [facility] Alfredia Ferguson, PA-C       Future Appointments             In 2 days Simmons-Robinson, Tawanna Cooler, MD [facility], [facility]

## 2023-09-12 ENCOUNTER — Other Ambulatory Visit: Payer: Self-pay | Admitting: Family Medicine

## 2023-09-12 DIAGNOSIS — E7800 Pure hypercholesterolemia, unspecified: Secondary | ICD-10-CM

## 2023-09-12 DIAGNOSIS — I152 Hypertension secondary to endocrine disorders: Secondary | ICD-10-CM

## 2023-09-12 NOTE — Telephone Encounter (Signed)
 LOV 12*20*24 NOV [id number] LRF     Atorvastatin [id number]    Losartan-Hydrochlorothiazide [id number] LABS 3*14*25

## 2023-09-12 NOTE — Telephone Encounter (Signed)
 [facility] is requesting refill losartan-hydrochlorothiazide (HYZAAR) 100-25 MG tablet  & atorvastatin (LIPITOR) 80 MG tablet   Please advise

## 2023-09-13 ENCOUNTER — Ambulatory Visit: Payer: 59 | Admitting: Family Medicine

## 2023-09-13 MED ORDER — ATORVASTATIN CALCIUM 80 MG PO TABS: 80.0000 mg | ORAL_TABLET | Freq: Every day | ORAL | 0 refills | Status: DC

## 2023-09-13 MED ORDER — LOSARTAN POTASSIUM-HCTZ 100-25 MG PO TABS
1.0000 | ORAL_TABLET | Freq: Every day | ORAL | 1 refills | Status: DC
Start: 2023-09-13 — End: 2023-12-27

## 2023-09-13 MED ORDER — LOSARTAN POTASSIUM-HCTZ 100-25 MG PO TABS
1.0000 | ORAL_TABLET | Freq: Every day | ORAL | 1 refills | Status: DC
Start: 2023-09-13 — End: 2023-10-25

## 2023-10-03 NOTE — Addendum Note (Signed)
 Addended byMichaelyn Barter on: 10/03/2023 12:44 PM   Modules accepted: Orders

## 2023-10-06 ENCOUNTER — Ambulatory Visit: Payer: Self-pay

## 2023-10-06 DIAGNOSIS — L304 Erythema intertrigo: Secondary | ICD-10-CM

## 2023-10-06 NOTE — Telephone Encounter (Signed)
 Third attempt to reach pt, left message to call back.

## 2023-10-06 NOTE — Telephone Encounter (Signed)
  Copied From CRM [id number]. Reason for Triage: The patient has a rash on her arm that she has had previously that is itchy and may be infected. She is requesting to see if hydrocortisone 1 % lotion could be called in for her or if she should come in for an appointment. Her call back number is [phone].      Left message to call back about symptoms.

## 2023-10-06 NOTE — Telephone Encounter (Signed)
 2nd attempt - received vmb, lvm to return call

## 2023-10-09 ENCOUNTER — Other Ambulatory Visit: Payer: Self-pay | Admitting: Physician Assistant

## 2023-10-09 DIAGNOSIS — L304 Erythema intertrigo: Secondary | ICD-10-CM

## 2023-10-09 MED ORDER — HYDROCORTISONE 1 % EX LOTN: 1.0000 | TOPICAL_LOTION | Freq: Two times a day (BID) | CUTANEOUS | 0 refills | Status: AC

## 2023-10-09 MED ORDER — HYDROCORTISONE 1 % EX LOTN: 1.0000 | TOPICAL_LOTION | Freq: Two times a day (BID) | CUTANEOUS | 1 refills | Status: DC

## 2023-10-09 NOTE — Addendum Note (Signed)
 Addended by: Gautam Langhorst on: 10/09/2023 12:06 PM   Modules accepted: Orders

## 2023-10-09 NOTE — Addendum Note (Signed)
 Addended by: Jedidiah Demartini on: 10/09/2023 12:05 PM   Modules accepted: Orders

## 2023-10-13 ENCOUNTER — Inpatient Hospital Stay: Attending: Internal Medicine

## 2023-10-19 ENCOUNTER — Telehealth: Payer: Self-pay

## 2023-10-19 NOTE — Telephone Encounter (Signed)
 Copied from CRM [id number]. Topic: Clinical - Request for Lab/Test Order >> Oct 19, 2023  8:32 AM Baldemar Lev wrote: Reason for CRM: Pt's daughter believes that the pt's PH balance is off, requesting a pelvic exam. Wants PCP to recommend this to the patient because she "will refuse it" otherwise.   Pt's daughter is requesting a call back with follow up  Best contact: [phone]

## 2023-10-19 NOTE — Telephone Encounter (Signed)
 Pt has been scheduled for 4/30 at 4pm with Dr Ardeth Beckers

## 2023-10-19 NOTE — Telephone Encounter (Signed)
**Note De-identified  [place] Obfuscation** Please advise 

## 2023-10-19 NOTE — Telephone Encounter (Signed)
 Please schedule an appointment to discuss symptoms and we can recommend exam during visit if needed

## 2023-10-22 ENCOUNTER — Other Ambulatory Visit: Payer: Self-pay | Admitting: Family Medicine

## 2023-10-23 DIAGNOSIS — F0150 Vascular dementia without behavioral disturbance: Secondary | ICD-10-CM | POA: Insufficient documentation

## 2023-10-24 NOTE — Telephone Encounter (Signed)
 Requested Prescriptions  Pending Prescriptions Disp Refills   OZEMPIC , 2 MG/DOSE, 8 MG/3ML SOPN [Pharmacy Med Name: Ozempic  (2 MG/DOSE) 8 MG/3ML Subcutaneous Solution Pen-injector] 9 mL 0    Sig: INJECT 2MG  SUBCUTANEOUSLY ONCE A WEEK     Endocrinology:  Diabetes - GLP-1 Receptor Agonists - semaglutide  Failed - 10/24/2023 11:58 AM      Failed - HBA1C in normal range and within 180 days    Hemoglobin A1C  Date Value Ref Range Status  06/29/2016 6.5  Final   Hgb A1c MFr Bld  Date Value Ref Range Status  05/17/2023 5.8 (H) 4.8 - 5.6 % Final    Comment:             Prediabetes: 5.7 - 6.4          Diabetes: >6.4          Glycemic control for adults with diabetes: <7.0          Failed - Valid encounter within last 6 months    Recent Outpatient Visits   None            Passed - Cr in normal range and within 360 days    Creatinine  Date Value Ref Range Status  10/01/2014 0.81 mg/dL Final    Comment:    1.61-0.96 NOTE: New Reference Range  09/02/14    Creatinine, Ser  Date Value Ref Range Status  05/17/2023 0.79 0.57 - 1.00 mg/dL Final

## 2023-10-25 ENCOUNTER — Ambulatory Visit (INDEPENDENT_AMBULATORY_CARE_PROVIDER_SITE_OTHER): Admitting: Family Medicine

## 2023-10-25 ENCOUNTER — Other Ambulatory Visit (HOSPITAL_COMMUNITY)
Admission: RE | Admit: 2023-10-25 | Discharge: 2023-10-25 | Disposition: A | Discharge status: Home / Self Care | Source: Ambulatory Visit | Attending: Family Medicine | Admitting: Family Medicine

## 2023-10-25 ENCOUNTER — Encounter: Payer: Self-pay | Admitting: Family Medicine

## 2023-10-25 VITALS — BP 172/90 | HR 73 | Ht 62.0 in | Wt 228.0 lb

## 2023-10-25 DIAGNOSIS — I152 Hypertension secondary to endocrine disorders: Secondary | ICD-10-CM

## 2023-10-25 DIAGNOSIS — R350 Frequency of micturition: Secondary | ICD-10-CM

## 2023-10-25 DIAGNOSIS — E1159 Type 2 diabetes mellitus with other circulatory complications: Secondary | ICD-10-CM | POA: Diagnosis not present

## 2023-10-25 DIAGNOSIS — N898 Other specified noninflammatory disorders of vagina: Secondary | ICD-10-CM | POA: Diagnosis present

## 2023-10-25 DIAGNOSIS — M21951 Unspecified acquired deformity of right thigh: Secondary | ICD-10-CM

## 2023-10-25 DIAGNOSIS — Z7984 Long term (current) use of oral hypoglycemic drugs: Secondary | ICD-10-CM

## 2023-10-25 DIAGNOSIS — Z7985 Long-term (current) use of injectable non-insulin antidiabetic drugs: Secondary | ICD-10-CM

## 2023-10-25 LAB — POCT URINALYSIS DIPSTICK
Bilirubin, UA: NEGATIVE
Blood, UA: NEGATIVE
Glucose, UA: NEGATIVE
Ketones, UA: NEGATIVE
Nitrite, UA: NEGATIVE
Protein, UA: POSITIVE — AB
Spec Grav, UA: 1.02 (ref 1.010–1.025)
Urobilinogen, UA: 2 U/dL — AB
pH, UA: 7 (ref 5.0–8.0)

## 2023-10-25 NOTE — Progress Notes (Signed)
 ACUTE PATIENT VISIT    Patient: Kristin Mora   DOB: 04-Nov-1955   68 y.o. Female  MRN: [id number] Visit Date: 10/25/2023  Today's healthcare provider: Mimi Alt, MD   PCP: Mimi Alt, MD   Chief Complaint  Patient presents with   Vaginal Discharge    Discuss and order this has been going on for months     Subjective     HPI     Vaginal Discharge    Additional comments: Discuss and order this has been going on for months       Last edited by Bart Lieu, CMA on 10/25/2023  3:57 PM.       Discussed the use of AI scribe software for clinical note transcription with the patient, who gave verbal consent to proceed.  History of Present Illness          Discussed the use of AI scribe software for clinical note transcription with the patient, who gave verbal consent to proceed.  History of Present Illness Kristin Mora is a 68 year old female with vascular dementia who presents with concerns for vaginal odor and urinary frequency.  She has concerns about vaginal odor and urinary frequency. She recalls a history of white, chunky discharge years ago without associated itching or irritation. There is no recent sexual activity, labial pain, or abnormal discomfort. She describes an odd sensation on her right side, which she attributes to her bone structure, recalling a full body cast at age 62.  Her medical history includes vascular dementia, hypertension, diabetes, obstructive sleep apnea, and a seizure disorder. She is on Ozempic  2 mg once weekly, metformin  500 mg twice daily, metoprolol  25 mg daily, losartan  125 mg daily, atorvastatin  80 mg daily, and amlodipine  10 mg daily. Her blood pressure tends to rise during doctor visits but stabilizes after a few minutes. Her blood pressure was recorded at 172/90 after two checks during this visit.  She lives with her daughter, Kristin Mora, and is actively involved in caring for her grandchildren,  including babysitting a six-month-old baby.  In the review of symptoms, she reports no itching, irritation, recent sexual activity, labial pain, or abdominal pain. She experiences occasional cervical tenderness. Her diabetes is managed with metformin  and Ozempic , and her last A1c was 5.8. Her kidney function was normal in November.       Past Medical History:  Diagnosis Date   Asthma    Brain aneurysm 04/2007   Diabetes mellitus without complication (HCC)    controlled;   HLD (hyperlipidemia)    Hypertension    controlled with medication   Stroke (HCC)    Vertigo     Medications: Outpatient Medications Prior to Visit  Medication Sig   amLODipine  (NORVASC ) 10 MG tablet Take 1 tablet by mouth once daily   atorvastatin  (LIPITOR) 80 MG tablet Take 1 tablet (80 mg total) by mouth daily.   Blood Pressure Monitoring (BLOOD PRESSURE DIGITAL SOLN) KIT Dispense one automatic/digital blood pressure machine; check blood pressure twice daily in AM and PM   hydrocortisone  1 % lotion Apply 1 Application topically 2 (two) times daily. For itching   ketoconazole  (NIZORAL ) 2 % cream Apply 1 Application topically daily. On R armpit, under R breast   lactulose  (CHRONULAC ) 10 GM/15ML solution Take 15 mLs (10 g total) by mouth 2 (two) times daily as needed for severe constipation.   lidocaine  (LIDODERM ) 5 % Place 1 patch onto the skin daily. Remove & Discard patch  within 12 hours or as directed by MD   losartan -hydrochlorothiazide  (HYZAAR) 100-25 MG tablet Take 1 tablet by mouth daily.   metFORMIN  (GLUCOPHAGE ) 500 MG tablet TAKE 1 TABLET BY MOUTH TWICE DAILY WITH A MEAL   metoprolol  succinate (TOPROL -XL) 25 MG 24 hr tablet Take 1 tablet (25 mg total) by mouth daily.   Miconazole -Zinc  Oxide-Petrolat 0.25-15-81.35 % OINT Apply to affected skin twice a day until clear   oxybutynin  (DITROPAN ) 5 MG tablet Take 1 tablet (5 mg total) by mouth 2 (two) times daily. (Patient not taking: Reported on 06/09/2023)    OZEMPIC , 2 MG/DOSE, 8 MG/3ML SOPN INJECT 2MG  SUBCUTANEOUSLY ONCE A WEEK   terbinafine  (LAMISIL ) 1 % cream Apply 1 Application topically 2 (two) times daily. To affected areas of rash under breast and under arms   triamcinolone  (KENALOG ) 0.025 % ointment Apply 1 Application topically 2 (two) times daily.   Vitamin D , Ergocalciferol , (DRISDOL ) 1.25 MG (50000 UNIT) CAPS capsule Take 1 capsule (50,000 Units total) by mouth once a week.   [DISCONTINUED] atorvastatin  (LIPITOR) 80 MG tablet Take 1 tablet (80 mg total) by mouth daily.   [DISCONTINUED] donepezil  (ARICEPT  ODT) 5 MG disintegrating tablet Take 1 tablet (5 mg total) by mouth at bedtime. (Patient not taking: Reported on 06/16/2023)   [DISCONTINUED] losartan -hydrochlorothiazide  (HYZAAR) 100-25 MG tablet Take 1 tablet by mouth daily.   [DISCONTINUED] memantine  (NAMENDA  XR) 7 MG CP24 24 hr capsule Take 1 capsule (7 mg total) by mouth daily. (Patient not taking: Reported on 06/16/2023)   No facility-administered medications prior to visit.    Review of Systems  Last metabolic panel Lab Results  Component Value Date   GLUCOSE 83 05/17/2023   NA 141 05/17/2023   K 4.4 05/17/2023   CL 101 05/17/2023   CO2 25 05/17/2023   BUN 15 05/17/2023   CREATININE 0.79 05/17/2023   EGFR 81 05/17/2023   CALCIUM  9.8 05/17/2023   PROT 6.7 05/17/2023   ALBUMIN 3.6 (L) 05/17/2023   LABGLOB 3.1 05/17/2023   AGRATIO 1.3 07/08/2022   BILITOT 0.7 05/17/2023   ALKPHOS 65 05/17/2023   AST 15 05/17/2023   ALT 9 05/17/2023   ANIONGAP 9 05/09/2023   Last lipids Lab Results  Component Value Date   CHOL 121 05/17/2023   HDL 36 (L) 05/17/2023   LDLCALC 66 05/17/2023   TRIG 103 05/17/2023   CHOLHDL 3.4 05/17/2023   Last hemoglobin A1c Lab Results  Component Value Date   HGBA1C 5.8 (H) 05/17/2023        Objective    BP (!) 172/90   Pulse 73   Ht 5\' 2"  (1.575 m)   Wt 228 lb (103.4 kg)   SpO2 97%   BMI 41.70 kg/m  BP Readings from Last 3  Encounters:  10/25/23 (!) 172/90  09/08/23 (!) 150/69  06/16/23 (!) 155/79   Wt Readings from Last 3 Encounters:  10/25/23 228 lb (103.4 kg)  09/08/23 228 lb (103.4 kg)  06/16/23 224 lb (101.6 kg)        Physical Exam  General: Alert, no acute distress, ambulating with cane  Cardio: Normal S1 and S2, RRR, no r/m/g Pulm: CTAB, normal work of breathing ABD: soft, abdomen is not distended, there is no tenderness to palpation, normal BS, specifically no suprapubic tenderness   Pt declined GU exam  Results for orders placed or performed in visit on 10/25/23  Urine Culture   Specimen: Urine   Urine  Result Value Ref Range  Urine Culture, Routine Final report    Organism ID, Bacteria Comment   POCT Urinalysis Dipstick  Result Value Ref Range   Color, UA Yellow    Clarity, UA clear    Glucose, UA Negative Negative   Bilirubin, UA neg    Ketones, UA neg    Spec Grav, UA 1.020 1.010 - 1.025   Blood, UA neg    pH, UA 7.0 5.0 - 8.0   Protein, UA Positive (A) Negative   Urobilinogen, UA 2.0 (A) 0.2 or 1.0 E.U./dL   Nitrite, UA neg    Leukocytes, UA Trace (A) Negative   Appearance     Odor      Assessment & Plan     Problem List Items Addressed This Visit       Cardiovascular and Mediastinum   Hypertension associated with diabetes (HCC)   Other Visit Diagnoses       Vaginal odor    -  Primary   Relevant Orders   Cervicovaginal ancillary only     Urinary frequency       Relevant Orders   Cervicovaginal ancillary only   Urine Culture (Completed)   POCT Urinalysis Dipstick (Completed)     Deformity of right hip joint       Relevant Orders   DG HIPS BILAT W OR W/O PELVIS 3-4 VIEWS       Assessment & Plan Hypertension Severely elevated blood pressure at 172/90 mmHg, repeat is  White coat hypertension is a consideration. Current medications include metoprolol , losartan , and amlodipine . She is reluctant to increase medication dosage. - Continue metoprolol  25 mg  daily - Continue losartan  125 mg daily - Continue amlodipine  10 mg daily - Recheck blood pressure  Urinary symptoms Complaints of vaginal odor and urinary frequency. Urinalysis shows some white blood cells but not indicative of UTI. Differential includes trichomonas, bacterial vaginosis, or yeast infection. Reports past discharge that was white and chunky, suggestive of yeast infection. - Order urine culture - Order eftema swab to test for trichomonas, bacterial vaginosis, or yeast  Right hip discomfort Reports feeling of oddity in right hip area without pain. Full body cast at age 48. Physical exam suggests bone structure is normal. X-ray ordered to rule out abnormalities. - Order bilateral hip x-rays  Type 2 diabetes mellitus Well-controlled diabetes with last A1c at 5.8%. Current medications include metformin  and Ozempic . - Continue metformin  500 mg twice daily - Continue Ozempic  2 mg once weekly       Return in about 3 months (around 01/24/2024) for DM, HTN.         Mimi Alt, MD  [facility] [phone] (phone) [phone] (fax)  [facility]

## 2023-10-27 ENCOUNTER — Ambulatory Visit: Admitting: Family Medicine

## 2023-10-27 ENCOUNTER — Encounter: Payer: Self-pay | Admitting: Family Medicine

## 2023-10-27 LAB — URINE CULTURE

## 2023-10-30 LAB — CERVICOVAGINAL ANCILLARY ONLY
Bacterial Vaginitis (gardnerella): NEGATIVE
Candida Glabrata: NEGATIVE
Candida Vaginitis: NEGATIVE
Chlamydia: NEGATIVE
Comment: NEGATIVE
Comment: NEGATIVE
Comment: NEGATIVE
Comment: NEGATIVE
Comment: NEGATIVE
Comment: NORMAL
Neisseria Gonorrhea: NEGATIVE
Trichomonas: NEGATIVE

## 2023-11-03 ENCOUNTER — Inpatient Hospital Stay

## 2023-11-06 ENCOUNTER — Inpatient Hospital Stay: Attending: Internal Medicine

## 2023-11-17 ENCOUNTER — Inpatient Hospital Stay

## 2023-11-19 ENCOUNTER — Other Ambulatory Visit: Payer: Self-pay | Admitting: Family Medicine

## 2023-11-19 DIAGNOSIS — E559 Vitamin D deficiency, unspecified: Secondary | ICD-10-CM

## 2023-11-24 ENCOUNTER — Inpatient Hospital Stay: Attending: Internal Medicine

## 2023-11-24 ENCOUNTER — Other Ambulatory Visit: Payer: Self-pay | Admitting: Oncology

## 2023-11-24 DIAGNOSIS — Z79899 Other long term (current) drug therapy: Secondary | ICD-10-CM | POA: Insufficient documentation

## 2023-11-24 DIAGNOSIS — E538 Deficiency of other specified B group vitamins: Secondary | ICD-10-CM | POA: Diagnosis present

## 2023-11-24 MED ORDER — CYANOCOBALAMIN 1000 MCG/ML IJ SOLN
1000.0000 ug | Freq: Once | INTRAMUSCULAR | Status: AC
  Administered 2023-11-24: 1000 ug via INTRAMUSCULAR
  Filled 2023-11-24: qty 1

## 2023-12-01 ENCOUNTER — Inpatient Hospital Stay

## 2023-12-07 ENCOUNTER — Telehealth: Payer: Self-pay

## 2023-12-07 NOTE — Telephone Encounter (Signed)
 Copied from CRM [id number]. Topic: Appointments - Appointment Info/Confirmation >> Dec 07, 2023  9:28 AM Kristin Mora wrote: Patient/patient representative is calling for information regarding an appointment.  Patient confirmed appt 12/08/23 at 9:40 patient would like to know if she can get injection at this appt instead of 3:45

## 2023-12-07 NOTE — Telephone Encounter (Signed)
 Called patient back to get more information on what injection she is referring to. Voicemail not set up. Ok for E2C2 to get more information please if patient calls back.

## 2023-12-08 ENCOUNTER — Inpatient Hospital Stay

## 2023-12-08 ENCOUNTER — Ambulatory Visit: Admitting: Family Medicine

## 2023-12-11 ENCOUNTER — Inpatient Hospital Stay: Attending: Internal Medicine

## 2023-12-15 ENCOUNTER — Inpatient Hospital Stay

## 2023-12-15 ENCOUNTER — Ambulatory Visit: Payer: Self-pay

## 2023-12-15 DIAGNOSIS — I10 Essential (primary) hypertension: Secondary | ICD-10-CM | POA: Diagnosis not present

## 2023-12-15 DIAGNOSIS — H43811 Vitreous degeneration, right eye: Secondary | ICD-10-CM | POA: Diagnosis not present

## 2023-12-15 DIAGNOSIS — H35033 Hypertensive retinopathy, bilateral: Secondary | ICD-10-CM | POA: Diagnosis not present

## 2023-12-15 DIAGNOSIS — H2513 Age-related nuclear cataract, bilateral: Secondary | ICD-10-CM | POA: Diagnosis not present

## 2023-12-15 DIAGNOSIS — H3509 Other intraretinal microvascular abnormalities: Secondary | ICD-10-CM | POA: Diagnosis not present

## 2023-12-15 DIAGNOSIS — D3131 Benign neoplasm of right choroid: Secondary | ICD-10-CM | POA: Diagnosis not present

## 2023-12-15 DIAGNOSIS — E119 Type 2 diabetes mellitus without complications: Secondary | ICD-10-CM | POA: Diagnosis not present

## 2023-12-15 DIAGNOSIS — H4312 Vitreous hemorrhage, left eye: Secondary | ICD-10-CM | POA: Diagnosis not present

## 2023-12-15 DIAGNOSIS — H3562 Retinal hemorrhage, left eye: Secondary | ICD-10-CM | POA: Diagnosis not present

## 2023-12-15 NOTE — Telephone Encounter (Signed)
 FYI Only or Action Required?: FYI only for provider.  Patient was last seen in primary care on 10/25/2023 by Mimi Alt, MD. Called Nurse Triage reporting Loss of Vision in left eye - blurry with a line down the middle of vision field. Symptoms began 20-30 minutes ago. Interventions attempted: Nothing. Symptoms are: unchanged.  Triage Disposition: Go to ED or PCP/Alternative with Approval  Patient/caregiver understands and will follow disposition?: Yes                 Copied from CRM [id number]. Topic: Clinical - Red Word Triage >> Dec 15, 2023  8:28 AM Kristin Mora wrote: Red Word that prompted transfer to Nurse Triage: Patient states she woke up this morning with a vertical line going through her left eye, where she is uable to see. States can see on the outsides of the line but its blurry. Reason for Disposition  [1] Blurred vision or visual changes AND [2] present now AND [3] sudden onset or new (e.g., minutes, hours, days)  (Exception: Seeing floaters / black specks OR previously diagnosed migraine headaches with same symptoms.)  Answer Assessment - Initial Assessment Questions 1. DESCRIPTION: How has your vision changed? (e.g., complete vision loss, blurred vision, double vision, floaters, etc.)     Vision loss - blurry 2. LOCATION: One or both eyes? If one, ask: Which eye?     left 3. SEVERITY: Can you see anything? If Yes, ask: What can you see? (e.g., fine print)     Blurry with line in the middle of vision field 4. ONSET: When did this begin? Did it start suddenly or has this been gradual?     30 minutes ago 5. PATTERN: Does this come and go, or has it been constant since it started?     constant 6. PAIN: Is there any pain in your eye(s)?  (Scale 1-10; or mild, moderate, severe)   - NONE (0): No pain.   - MILD (1-3): Doesn't interfere with normal activities.   - MODERATE (4-7): Interferes with normal activities or awakens from sleep.     - SEVERE (8-10): Excruciating pain, unable to do any normal activities.     no 7. CONTACTS-GLASSES: Do you wear contacts or glasses?     glasses 8. CAUSE: What do you think is causing this visual problem?     unsure 9. OTHER SYMPTOMS: Do you have any other symptoms? (e.g., confusion, headache, arm or leg weakness, speech problems)     No other  Protocols used: Vision Loss or Change-A-AH

## 2023-12-20 NOTE — Telephone Encounter (Signed)
 Agree with ED visit.

## 2023-12-22 ENCOUNTER — Telehealth: Payer: Self-pay | Admitting: Family Medicine

## 2023-12-22 DIAGNOSIS — E119 Type 2 diabetes mellitus without complications: Secondary | ICD-10-CM

## 2023-12-22 MED ORDER — METFORMIN HCL 500 MG PO TABS: 500.0000 mg | ORAL_TABLET | Freq: Two times a day (BID) | ORAL | 1 refills | Status: DC

## 2023-12-22 NOTE — Telephone Encounter (Signed)
 LOV 10/25/23 NOV 01/17/24 LRF 06/19/23 LABS  10/25/23

## 2023-12-22 NOTE — Telephone Encounter (Signed)
[facility] faxed refill request for the following medications: ° °metFORMIN (GLUCOPHAGE) 500 MG tablet  ° °Please advise. °

## 2023-12-25 ENCOUNTER — Ambulatory Visit: Payer: Self-pay

## 2023-12-25 ENCOUNTER — Emergency Department

## 2023-12-25 ENCOUNTER — Encounter: Payer: Self-pay | Admitting: *Deleted

## 2023-12-25 ENCOUNTER — Other Ambulatory Visit: Payer: Self-pay

## 2023-12-25 ENCOUNTER — Emergency Department
Admission: EM | Admit: 2023-12-25 | Discharge: 2023-12-25 | Disposition: A | Discharge status: Home / Self Care | Attending: Emergency Medicine | Admitting: Emergency Medicine

## 2023-12-25 DIAGNOSIS — J45909 Unspecified asthma, uncomplicated: Secondary | ICD-10-CM | POA: Insufficient documentation

## 2023-12-25 DIAGNOSIS — H538 Other visual disturbances: Secondary | ICD-10-CM | POA: Insufficient documentation

## 2023-12-25 DIAGNOSIS — E119 Type 2 diabetes mellitus without complications: Secondary | ICD-10-CM | POA: Diagnosis not present

## 2023-12-25 DIAGNOSIS — I10 Essential (primary) hypertension: Secondary | ICD-10-CM | POA: Insufficient documentation

## 2023-12-25 DIAGNOSIS — R42 Dizziness and giddiness: Secondary | ICD-10-CM | POA: Diagnosis not present

## 2023-12-25 DIAGNOSIS — R9082 White matter disease, unspecified: Secondary | ICD-10-CM | POA: Diagnosis not present

## 2023-12-25 LAB — COMPREHENSIVE METABOLIC PANEL WITH GFR
ALT: 16 U/L (ref 0–44)
AST: 19 U/L (ref 15–41)
Albumin: 3.3 g/dL — ABNORMAL LOW (ref 3.5–5.0)
Alkaline Phosphatase: 51 U/L (ref 38–126)
Anion gap: 8 (ref 5–15)
BUN: 16 mg/dL (ref 8–23)
CO2: 28 mmol/L (ref 22–32)
Calcium: 9.3 mg/dL (ref 8.9–10.3)
Chloride: 103 mmol/L (ref 98–111)
Creatinine, Ser: 0.69 mg/dL (ref 0.44–1.00)
GFR, Estimated: >60 mL/min (ref >60)
Glucose, Bld: 91 mg/dL (ref 70–99)
Potassium: 4 mmol/L (ref 3.5–5.1)
Sodium: 139 mmol/L (ref 135–145)
Total Bilirubin: 1.1 mg/dL (ref 0.0–1.2)
Total Protein: 6.9 g/dL (ref 6.5–8.1)

## 2023-12-25 LAB — CBC
HCT: 44.4 % (ref 36.0–46.0)
Hemoglobin: 14.2 g/dL (ref 12.0–15.0)
MCH: 28.7 pg (ref 26.0–34.0)
MCHC: 32 g/dL (ref 30.0–36.0)
MCV: 89.9 fL (ref 80.0–100.0)
Platelets: 220 10*3/uL (ref 150–400)
RBC: 4.94 MIL/uL (ref 3.87–5.11)
RDW: 13.9 % (ref 11.5–15.5)
WBC: 9.3 10*3/uL (ref 4.0–10.5)
nRBC: 0 % (ref 0.0–0.2)

## 2023-12-25 LAB — DIFFERENTIAL
Abs Immature Granulocytes: 0.03 10*3/uL (ref 0.00–0.07)
Basophils Absolute: 0.1 10*3/uL (ref 0.0–0.1)
Basophils Relative: 1 %
Eosinophils Absolute: 0.4 10*3/uL (ref 0.0–0.5)
Eosinophils Relative: 4 %
Immature Granulocytes: 0 %
Lymphocytes Relative: 34 %
Lymphs Abs: 3.2 10*3/uL (ref 0.7–4.0)
Monocytes Absolute: 1.1 10*3/uL — ABNORMAL HIGH (ref 0.1–1.0)
Monocytes Relative: 12 %
Neutro Abs: 4.5 10*3/uL (ref 1.7–7.7)
Neutrophils Relative %: 49 %

## 2023-12-25 LAB — CBG MONITORING, ED: Glucose-Capillary: 77 mg/dL (ref 70–99)

## 2023-12-25 NOTE — ED Notes (Signed)
 Called CCMD at this time for cardiac monitoring.

## 2023-12-25 NOTE — Telephone Encounter (Signed)
 Copied from CRM [id number]. Topic: Clinical - Red Word Triage >> Dec 25, 2023  4:05 PM Zebedee SAUNDERS wrote: Red Word that prompted transfer to Nurse Triage: Pt's daughter Darice called on behalf of pt. Pt cannot see out of right eye due to blood vessel same as left eye which is completely blind.   FYI Only or Action Required?: FYI only for provider.  Patient was last seen in primary care on 10/25/2023 by Sharma Coyer, MD. Called Nurse Triage reporting Loss of Vision. Symptoms began today in the Right eye, previous vision loss in the Left eye. Interventions attempted: Nothing. Symptoms are: unchanged.  Triage Disposition: Go to ED or PCP/Alternative with Approval  Patient/caregiver understands and will follow disposition?: Yes   Reason for Disposition  [1] Blurred vision or visual changes AND [2] present now AND [3] sudden onset or new (e.g., minutes, hours, days)  (Exception: Seeing floaters / black specks OR previously diagnosed migraine headaches with same symptoms.)  Answer Assessment - Initial Assessment Questions 1. DESCRIPTION: How has your vision changed? (e.g., complete vision loss, blurred vision, double vision, floaters, etc.)     Blurred  2. LOCATION: One or both eyes? If one, ask: Which eye?     Bilateral eyes now, right is new today  3. SEVERITY: Can you see anything? If Yes, ask: What can you see? (e.g., fine print)     Blurred vision  4. ONSET: When did this begin? Did it start suddenly or has this been gradual?     Today 5. PATTERN: Does this come and go, or has it been constant since it started?     Constant  6. PAIN: Is there any pain in your eye(s)?  (Scale 1-10; or mild, moderate, severe)   - NONE (0): No pain.   - MILD (1-3): Doesn't interfere with normal activities.   - MODERATE (4-7): Interferes with normal activities or awakens from sleep.    - SEVERE (8-10): Excruciating pain, unable to do any normal activities.     Moderate to severe   7. CONTACTS-GLASSES: Do you wear contacts or glasses?     No 8. CAUSE: What do you think is causing this visual problem?     Burst blood vessel  9. OTHER SYMPTOMS: Do you have any other symptoms? (e.g., confusion, headache, arm or leg weakness, speech problems)     No  Protocols used: Vision Loss or Change-A-AH

## 2023-12-25 NOTE — ED Triage Notes (Addendum)
 Pt to triage via wheelchair.  Pt reports blurred vision in right eye with dizziness at 1645.  No headache. No slurred speech.  Pt took bp meds today.   Pt had laser done on left eye 2 weeks ago.  Pt is diabetic.  Pt alert.  Speech clear.  Hx cva.  Fsbs 77 in triage.

## 2023-12-25 NOTE — Discharge Instructions (Addendum)
 Please follow-up with your ophthalmologist tomorrow morning for recheck/reevaluation.  Return to the emergency department immediately for any further visual deficits, any weakness or numbness of any arm or leg confusion or difficulty speaking.

## 2023-12-25 NOTE — Telephone Encounter (Signed)
 Pt call and report reviewed. Agree with ED for urgent evaluation

## 2023-12-25 NOTE — ED Notes (Signed)
 No code stroke per dr viviann at this time

## 2023-12-25 NOTE — ED Provider Notes (Signed)
 [facility] Provider Note    Event Date/Time   First MD Initiated Contact with Patient 12/25/23 1904     (approximate)  History   Chief Complaint: Eye Problem and Dizziness  HPI  Kristin Mora is a 68 y.o. female with a past medical history of asthma, prior brain aneurysm status post clipping, diabetes, hypertension, hyperlipidemia, CVA, vertigo, presents to the emergency department with blurred vision and dizziness.  According to the patient she was at home watching TV when she suddenly had a sensation of blurred vision and felt somewhat dizzy.  Patient states symptoms lasted approximately 10 minutes and then resolved.  Patient has a history of decreased vision from the left eye currently undergoing treatment by ophthalmology.  Physical Exam   Triage Vital Signs: ED Triage Vitals  Encounter Vitals Group     BP 12/25/23 1715 (!) 170/74     Girls Systolic BP Percentile --      Girls Diastolic BP Percentile --      Boys Systolic BP Percentile --      Boys Diastolic BP Percentile --      Pulse Rate 12/25/23 1715 64     Resp 12/25/23 1715 18     Temp 12/25/23 1715 97.6 F (36.4 C)     Temp Source 12/25/23 1715 Oral     SpO2 12/25/23 1715 95 %     Weight 12/25/23 1716 200 lb (90.7 kg)     Height 12/25/23 1716 5' 2 (1.575 m)     Head Circumference --      Peak Flow --      Pain Score 12/25/23 1716 0     Pain Loc --      Pain Education --      Exclude from Growth Chart --     Most recent vital signs: Vitals:   12/25/23 1820 12/25/23 1830  BP:  (!) 159/75  Pulse: 60 62  Resp: 20 17  Temp:    SpO2: 96% 94%    General: Awake, no distress.  CV:  Good peripheral perfusion.  Regular rate and rhythm  Resp:  Normal effort.  Equal breath sounds bilaterally.  Abd:  No distention.  Soft, nontender.  No rebound or guarding. Other:  Equal grip strength bilaterally.  No pronator drift.  Cranial nerves appear intact.   ED Results / Procedures /  Treatments   EKG  EKG viewed and interpreted by myself shows a normal sinus rhythm at 64 bpm with a narrow QRS, normal axis, normal intervals, no concerning ST changes.  RADIOLOGY  I have reviewed interpret the CT of the images.  No bleed seen on my evaluation. Radiologist read the CT scan head is negative for acute finding.   MEDICATIONS ORDERED IN ED: Medications - No data to display   IMPRESSION / MDM / ASSESSMENT AND PLAN / ED COURSE  I reviewed the triage vital signs and the nursing notes.  Patient's presentation is most consistent with acute presentation with potential threat to life or bodily function.  Patient presents to the emergency department for blurred vision started several hours ago lasted approximately 10 minutes and then resolved.  Patient has a history of a prior aneurysm and clipping.  CT scan however is reassuring showing no acute finding, no bleed.  Patient's lab work shows a reassuring CBC, reassuring chemistry.  Given the patient's symptoms although brief I would prefer to obtain an MRI of the brain to further evaluate to rule out  any underlying small CVA.  Reassuringly patient's vision returned back to baseline after 10 minutes continues to be at baseline per patient with no deficits.  Patient denies any weakness or numbness of any arm or leg confusion or slurred speech at any point.  Unfortunately after discussing with MRI and they discussed with radiology we are unable to get an MRI due to the patient's prior aneurysmal clipping and we are unable to review any data to verify that these are MRI compatible clips.  There is no prior MRIs in our system or Care Everywhere.  Unfortunately we are unable to proceed with an MRI however given the patient's reassuring workup reassuring exam reassuring CT images and the fact that her symptoms have resolved after 10 minutes I believe the patient would be safe for discharge home to follow-up with her ophthalmologist in the morning.   Patient is agreeable this plan as well.  FINAL CLINICAL IMPRESSION(S) / ED DIAGNOSES   Blurred vision  Note:  This document was prepared using Dragon voice recognition software and may include unintentional dictation errors.   [name], MD 12/25/23 2018

## 2023-12-26 ENCOUNTER — Telehealth: Payer: Self-pay

## 2023-12-26 NOTE — Telephone Encounter (Signed)
 Patient was seen in the ER yesterday.  Appointment has been made for patient for ER f/u   Copied from CRM [id number]. Topic: Clinical - Medical Advice >> Dec 25, 2023  4:35 PM Turkey B wrote: Pt's daugter called n wanting to schedule an appt to have pt medicine adjusted because of her bp 197/126, but I let her know I can't schedule appt, until the pt is seen at the ER , per the advice of NT and then we can schedule a fu.

## 2023-12-27 ENCOUNTER — Ambulatory Visit: Admitting: Family Medicine

## 2023-12-27 ENCOUNTER — Encounter: Payer: Self-pay | Admitting: Family Medicine

## 2023-12-27 VITALS — BP 182/93 | HR 60 | Ht 62.0 in | Wt 232.6 lb

## 2023-12-27 DIAGNOSIS — E2839 Other primary ovarian failure: Secondary | ICD-10-CM | POA: Diagnosis not present

## 2023-12-27 DIAGNOSIS — G4710 Hypersomnia, unspecified: Secondary | ICD-10-CM

## 2023-12-27 DIAGNOSIS — I152 Hypertension secondary to endocrine disorders: Secondary | ICD-10-CM | POA: Diagnosis not present

## 2023-12-27 DIAGNOSIS — E1159 Type 2 diabetes mellitus with other circulatory complications: Secondary | ICD-10-CM | POA: Diagnosis not present

## 2023-12-27 DIAGNOSIS — Z1231 Encounter for screening mammogram for malignant neoplasm of breast: Secondary | ICD-10-CM

## 2023-12-27 DIAGNOSIS — Z7984 Long term (current) use of oral hypoglycemic drugs: Secondary | ICD-10-CM

## 2023-12-27 MED ORDER — VALSARTAN-HYDROCHLOROTHIAZIDE 320-25 MG PO TABS: 1.0000 | ORAL_TABLET | Freq: Every day | ORAL | 0 refills | Status: DC

## 2023-12-27 NOTE — Patient Instructions (Signed)
 Kristin Mora  Please review the attached list of medications and notify my office if there are any errors.   . Please bring all of your medications to every appointment so we can make sure that our medication list is the same as yours.

## 2023-12-27 NOTE — Progress Notes (Signed)
 Established patient visit   Patient: Kristin Mora   DOB: 1955-10-28   68 y.o. Female  MRN: [id number] Visit Date: 12/27/2023  Today's healthcare provider: Nancyann Perry, MD   Chief Complaint  Patient presents with   Follow-up    Patient was seen at [facility] on 12/25/23 due to blurred vision and dizziness. Patient was discharge home to follow-up with her ophthalmologist in the morning. Patient has an upcoming appointment. She reports still having some blurred vision in her left eye. She reports no dizzy spells since her visit.   Patient daughter reports she doesn't feel like the blood pressure medication is working due to high readings. Previous hx of sleep apnea and wondering if that can contributing to it as she no longer uses   Hypertension    Patient is taking medication as prescribed and last home reading was 150/78.  She repots some lower leg edema in her ankles.    Subjective    HPI Follow up accelerated hypertension. Taking all medications consistently. No chest pains or palpitations. Home BP similar to office reading.   BP Readings from Last 10 Encounters:  12/27/23 (!) 182/93  12/25/23 (!) 159/75  10/25/23 (!) 172/90  09/08/23 (!) 150/69  06/16/23 (!) 155/79  06/09/23 (!) 160/75  05/17/23 (!) 161/61  05/09/23 (!) 153/63  12/12/22 118/69  11/11/22 (!) 119/54   Wt Readings from Last 3 Encounters:  12/27/23 232 lb 9.6 oz (105.5 kg)  12/25/23 200 lb (90.7 kg)  10/25/23 228 lb (103.4 kg)   Also states very sleepy during the day and remotely diagnosed with sleep apnea. Never started CPAP, would like re-evaluation.    Medications: Outpatient Medications Prior to Visit  Medication Sig   amLODipine  (NORVASC ) 10 MG tablet Take 1 tablet by mouth once daily   atorvastatin  (LIPITOR) 80 MG tablet Take 1 tablet (80 mg total) by mouth daily.   Blood Pressure Monitoring (BLOOD PRESSURE DIGITAL SOLN) KIT Dispense one automatic/digital blood pressure machine; check blood  pressure twice daily in AM and PM   hydrocortisone  1 % lotion Apply 1 Application topically 2 (two) times daily. For itching   ketoconazole  (NIZORAL ) 2 % cream Apply 1 Application topically daily. On R armpit, under R breast   lactulose  (CHRONULAC ) 10 GM/15ML solution Take 15 mLs (10 g total) by mouth 2 (two) times daily as needed for severe constipation.   lidocaine  (LIDODERM ) 5 % Place 1 patch onto the skin daily. Remove & Discard patch within 12 hours or as directed by MD   metFORMIN  (GLUCOPHAGE ) 500 MG tablet Take 1 tablet (500 mg total) by mouth 2 (two) times daily with a meal.   metoprolol  succinate (TOPROL -XL) 25 MG 24 hr tablet Take 1 tablet (25 mg total) by mouth daily.   Miconazole -Zinc  Oxide-Petrolat 0.25-15-81.35 % OINT Apply to affected skin twice a day until clear   oxybutynin  (DITROPAN ) 5 MG tablet Take 1 tablet (5 mg total) by mouth 2 (two) times daily. (Patient not taking: Reported on 06/09/2023)   OZEMPIC , 2 MG/DOSE, 8 MG/3ML SOPN INJECT 2MG  SUBCUTANEOUSLY ONCE A WEEK   terbinafine  (LAMISIL ) 1 % cream Apply 1 Application topically 2 (two) times daily. To affected areas of rash under breast and under arms   triamcinolone  (KENALOG ) 0.025 % ointment Apply 1 Application topically 2 (two) times daily.   Vitamin D , Ergocalciferol , (DRISDOL ) 1.25 MG (50000 UNIT) CAPS capsule Take 1 capsule by mouth once a week    losartan -hydrochlorothiazide  (HYZAAR) 100-25 MG  tablet Take 1 tablet by mouth daily.   No facility-administered medications prior to visit.       Objective    BP (!) 182/93 (BP Location: Left Arm, Patient Position: Sitting, Cuff Size: Normal)   Pulse 60   Ht 5' 2 (1.575 m)   Wt 232 lb 9.6 oz (105.5 kg)   SpO2 100%   BMI 42.54 kg/m   Physical Exam   General: Appearance:    Obese female in no acute distress  Eyes:    PERRL, conjunctiva/corneas clear, EOM's intact       Lungs:     Clear to auscultation bilaterally, respirations unlabored  Heart:    Normal heart  rate. Normal rhythm. No murmurs, rubs, or gallops.    MS:   All extremities are intact.    Neurologic:   Awake, alert, oriented x 3. No apparent focal neurological defect.         Assessment & Plan     1. Hypertension associated with diabetes (HCC) (Primary) Discussed changing to more intense BP medication versus adding another class of medications. She prefers to change to something stronger.   Change losartan -hydrochlorothiazide  to  - valsartan-hydrochlorothiazide  (DIOVAN-HCT) 320-25 MG tablet; Take 1 tablet by mouth daily. TAKE IN PLACE OF LOSARTAN -HCTZ  Dispense: 30 tablet; Refill: 0   2. Hypersomnia Remotely diagnosed with OSA but never started CPAP, order home sleep study.   3. Estrogen deficiency DEXA  4. Encounter for screening mammogram for malignant neoplasm of breast Order placed for mammogram.     Return in about 4 weeks (around 01/24/2024) for Hypertension.         Nancyann Perry, MD  [facility] [phone] (phone) [phone] (fax)  [facility]

## 2024-01-03 ENCOUNTER — Telehealth: Payer: Self-pay

## 2024-01-03 NOTE — Telephone Encounter (Signed)
 Per LOV note from Dr. Gasper: Change losartan -hydrochlorothiazide  to  - valsartan -hydrochlorothiazide  (DIOVAN -HCT) 320-25 MG tablet; Take 1 tablet by mouth daily. TAKE IN PLACE OF LOSARTAN -HCTZ  Dispense: 30 tablet; Refill: 0   Called to advise patient of this, patient verbalized understanding.

## 2024-01-03 NOTE — Telephone Encounter (Signed)
 Copied from CRM [id number]. Topic: Clinical - Medication Question >> Jan 03, 2024 10:43 AM Turkey B wrote: Reason for CRM: pt called in wants to know is she is supposed to stop taking losartan  and take the valsartan  instead

## 2024-01-05 ENCOUNTER — Inpatient Hospital Stay: Attending: Internal Medicine

## 2024-01-05 DIAGNOSIS — E538 Deficiency of other specified B group vitamins: Secondary | ICD-10-CM | POA: Diagnosis not present

## 2024-01-05 DIAGNOSIS — Z79899 Other long term (current) drug therapy: Secondary | ICD-10-CM | POA: Diagnosis not present

## 2024-01-05 LAB — VITAMIN B12: Vitamin B-12: 263 pg/mL (ref 180–914)

## 2024-01-05 MED ORDER — CYANOCOBALAMIN 1000 MCG/ML IJ SOLN
1000.0000 ug | Freq: Once | INTRAMUSCULAR | Status: AC
  Administered 2024-01-05: 1000 ug via INTRAMUSCULAR
  Filled 2024-01-05: qty 1

## 2024-01-07 LAB — CELIAC DISEASE PANEL
Endomysial Ab, IgA: NEGATIVE
IgA: 370 mg/dL — ABNORMAL HIGH (ref 87–352)
Tissue Transglutaminase Ab, IgA: <2 U/mL (ref 0–3)

## 2024-01-12 ENCOUNTER — Other Ambulatory Visit: Payer: Self-pay | Admitting: Family Medicine

## 2024-01-12 NOTE — Telephone Encounter (Signed)
 LOV 12/27/23 NOV 01/19/24 LRF 10/24/23 LABS 12/25/23

## 2024-01-15 ENCOUNTER — Other Ambulatory Visit: Payer: Self-pay | Admitting: Family Medicine

## 2024-01-15 DIAGNOSIS — H4312 Vitreous hemorrhage, left eye: Secondary | ICD-10-CM | POA: Diagnosis not present

## 2024-01-15 DIAGNOSIS — E559 Vitamin D deficiency, unspecified: Secondary | ICD-10-CM

## 2024-01-15 DIAGNOSIS — H3509 Other intraretinal microvascular abnormalities: Secondary | ICD-10-CM | POA: Diagnosis not present

## 2024-01-15 DIAGNOSIS — H43811 Vitreous degeneration, right eye: Secondary | ICD-10-CM | POA: Diagnosis not present

## 2024-01-15 DIAGNOSIS — H3562 Retinal hemorrhage, left eye: Secondary | ICD-10-CM | POA: Diagnosis not present

## 2024-01-15 DIAGNOSIS — H2513 Age-related nuclear cataract, bilateral: Secondary | ICD-10-CM | POA: Diagnosis not present

## 2024-01-15 DIAGNOSIS — D3131 Benign neoplasm of right choroid: Secondary | ICD-10-CM | POA: Diagnosis not present

## 2024-01-15 DIAGNOSIS — H35033 Hypertensive retinopathy, bilateral: Secondary | ICD-10-CM | POA: Diagnosis not present

## 2024-01-17 ENCOUNTER — Ambulatory Visit: Admitting: Family Medicine

## 2024-01-19 ENCOUNTER — Ambulatory Visit: Admitting: Family Medicine

## 2024-01-19 ENCOUNTER — Encounter: Payer: Self-pay | Admitting: Family Medicine

## 2024-01-19 VITALS — BP 148/80 | HR 65 | Ht 62.0 in | Wt 234.0 lb

## 2024-01-19 DIAGNOSIS — Z7985 Long-term (current) use of injectable non-insulin antidiabetic drugs: Secondary | ICD-10-CM

## 2024-01-19 DIAGNOSIS — I152 Hypertension secondary to endocrine disorders: Secondary | ICD-10-CM | POA: Diagnosis not present

## 2024-01-19 DIAGNOSIS — Z7984 Long term (current) use of oral hypoglycemic drugs: Secondary | ICD-10-CM

## 2024-01-19 DIAGNOSIS — E1159 Type 2 diabetes mellitus with other circulatory complications: Secondary | ICD-10-CM | POA: Diagnosis not present

## 2024-01-19 MED ORDER — CLONIDINE HCL 0.1 MG PO TABS: 0.1000 mg | ORAL_TABLET | Freq: Every day | ORAL | 3 refills | Status: AC

## 2024-01-19 NOTE — Patient Instructions (Signed)
  VISIT SUMMARY: During your visit, we discussed your elevated blood pressure readings and your current medications. We also reviewed your recent lab results and talked about your diet and exercise habits. Additionally, we addressed your eye health, including a healing burst blood vessel and cataracts.  YOUR PLAN: -HYPERTENSION: Hypertension means high blood pressure. Your blood pressure readings have been elevated despite your current medications.   We are adding clonidine 0.1 mg once daily in the afternoon to help control your blood pressure.   Please monitor your blood pressure after taking your current medications and before taking clonidine. If your blood pressure is 150/90 or higher, take clonidine.   If it drops to 90/50 or lower, stop taking clonidine and seek medical advice.   Continue taking your current medications: amlodipine , metoprolol , valsartan , and hydrochlorothiazide .   Make sure to use proper blood pressure monitoring techniques, and seek emergency care if you feel lightheaded or dizzy with low readings. If clonidine is not tolerated, we may consider hydralazine  as an alternative.                                  Contains text generated by [employer].

## 2024-01-19 NOTE — Progress Notes (Signed)
 Established patient visit   Patient: Kristin Mora   DOB: Jul 29, 1955   68 y.o. Female  MRN: [id number] Visit Date: 01/19/2024  Today's healthcare provider: Rockie Agent, MD   Chief Complaint  Patient presents with   Hypertension    Bp has been running on the higher side    Subjective     HPI     Hypertension    Additional comments: Bp has been running on the higher side       Last edited by Thelbert Eulalio HERO, CMA on 01/19/2024  3:29 PM.       Discussed the use of AI scribe software for clinical note transcription with the patient, who gave verbal consent to proceed.  History of Present Illness Kristin Mora is a 68 year old female with chronic hypertension who presents with elevated blood pressure readings.  She has been experiencing elevated blood pressure readings, with home measurements of 154/64 mmHg and an office reading of 163/68 mmHg. She is currently on amlodipine  10 mg daily, metoprolol  25 mg daily, and a combination of valsartan  and hydrochlorothiazide  320/25 mg daily, yet her blood pressure remains elevated.  Her diet is very limited, and she describes it as 'eating like a bird now.' She has difficulty eating due to dental issues, stating 'I be trying to eat with her, but I can't.' Her daughter encourages regular blood pressure monitoring and buys her Le[employer] meals. She used to walk for exercise but is concerned about safety when walking with her grandchild.  No lightheadedness or headaches, but she describes a sensation called 'the veil.' She has a history of a burst blood vessel in her eye, which is healing, and has been informed of cataracts by her eye specialist.  Recent lab work from June 30th showed good kidney function with a GFR greater than 60 and normal potassium levels. She uses a wrist blood pressure cuff at home and has tried different cuffs to ensure accuracy.     Past Medical History:  Diagnosis Date   Asthma    Brain  aneurysm 04/2007   Diabetes mellitus without complication (HCC)    controlled;   HLD (hyperlipidemia)    Hypertension    controlled with medication   Stroke (HCC)    Vertigo     Medications: Outpatient Medications Prior to Visit  Medication Sig   amLODipine  (NORVASC ) 10 MG tablet Take 1 tablet by mouth once daily   atorvastatin  (LIPITOR) 80 MG tablet Take 1 tablet (80 mg total) by mouth daily.   Blood Pressure Monitoring (BLOOD PRESSURE DIGITAL SOLN) KIT Dispense one automatic/digital blood pressure machine; check blood pressure twice daily in AM and PM   hydrocortisone  1 % lotion Apply 1 Application topically 2 (two) times daily. For itching   ketoconazole  (NIZORAL ) 2 % cream Apply 1 Application topically daily. On R armpit, under R breast   lactulose  (CHRONULAC ) 10 GM/15ML solution Take 15 mLs (10 g total) by mouth 2 (two) times daily as needed for severe constipation.   lidocaine  (LIDODERM ) 5 % Place 1 patch onto the skin daily. Remove & Discard patch within 12 hours or as directed by MD   metFORMIN  (GLUCOPHAGE ) 500 MG tablet Take 1 tablet (500 mg total) by mouth 2 (two) times daily with a meal.   metoprolol  succinate (TOPROL -XL) 25 MG 24 hr tablet Take 1 tablet (25 mg total) by mouth daily.   Miconazole -Zinc  Oxide-Petrolat 0.25-15-81.35 % OINT Apply to affected skin twice a  day until clear   oxybutynin  (DITROPAN ) 5 MG tablet Take 1 tablet (5 mg total) by mouth 2 (two) times daily. (Patient not taking: Reported on 01/19/2024)   Semaglutide , 2 MG/DOSE, (OZEMPIC , 2 MG/DOSE,) 8 MG/3ML SOPN INJECT 2MG   SUBCUTANEOUSLY ONCE A WEEK   terbinafine  (LAMISIL ) 1 % cream Apply 1 Application topically 2 (two) times daily. To affected areas of rash under breast and under arms   triamcinolone  (KENALOG ) 0.025 % ointment Apply 1 Application topically 2 (two) times daily.   valsartan -hydrochlorothiazide  (DIOVAN -HCT) 320-25 MG tablet Take 1 tablet by mouth daily. TAKE IN PLACE OF LOSARTAN -HCTZ   Vitamin D ,  Ergocalciferol , (DRISDOL ) 1.25 MG (50000 UNIT) CAPS capsule Take 1 capsule by mouth once a week   No facility-administered medications prior to visit.    Review of Systems  Last metabolic panel Lab Results  Component Value Date   GLUCOSE 91 12/25/2023   NA 139 12/25/2023   K 4.0 12/25/2023   CL 103 12/25/2023   CO2 28 12/25/2023   BUN 16 12/25/2023   CREATININE 0.69 12/25/2023   GFRNONAA >60 12/25/2023   CALCIUM  9.3 12/25/2023   PROT 6.9 12/25/2023   ALBUMIN 3.3 (L) 12/25/2023   LABGLOB 3.1 05/17/2023   AGRATIO 1.3 07/08/2022   BILITOT 1.1 12/25/2023   ALKPHOS 51 12/25/2023   AST 19 12/25/2023   ALT 16 12/25/2023   ANIONGAP 8 12/25/2023   Last lipids Lab Results  Component Value Date   CHOL 121 05/17/2023   HDL 36 (L) 05/17/2023   LDLCALC 66 05/17/2023   TRIG 103 05/17/2023   CHOLHDL 3.4 05/17/2023   Last hemoglobin A1c Lab Results  Component Value Date   HGBA1C 5.8 (H) 05/17/2023   Last thyroid functions Lab Results  Component Value Date   TSH 1.200 05/17/2023        Objective    BP (!) 148/80 (Cuff Size: Large)   Pulse 65   Ht 5' 2 (1.575 m)   Wt 234 lb (106.1 kg)   SpO2 100%   BMI 42.80 kg/m   BP Readings from Last 3 Encounters:  01/19/24 (!) 148/80  12/27/23 (!) 182/93  12/25/23 (!) 159/75   Wt Readings from Last 3 Encounters:  01/19/24 234 lb (106.1 kg)  12/27/23 232 lb 9.6 oz (105.5 kg)  12/25/23 200 lb (90.7 kg)        Physical Exam  General: Alert, no acute distress Cardio: RRR, no r/m/g Pulm: CTAB, normal work of breathing    No results found for any visits on 01/19/24.  Assessment & Plan     Problem List Items Addressed This Visit       Cardiovascular and Mediastinum   Hypertension associated with diabetes (HCC) - Primary   Hypertension Chronic hypertension with recent elevated readings. Home readings of 154/64 and office reading of 163/68. Current medications include amlodipine , metoprolol , valsartan , and  hydrochlorothiazide . Blood pressure management is challenging despite current regimen. Consideration of adding clonidine  to control systolic blood pressure. Clonidine  is a potent medication that can effectively lower blood pressure but requires careful monitoring to avoid hypotension. - Add clonidine  0.1 mg once daily in the morning. - Instruct to monitor blood pressure after taking current medications and before taking clonidine . - If blood pressure is 150/90 or higher, take clonidine . - If blood pressure drops to 90/50 or lower, discontinue clonidine  and seek medical advice. - Continue current medications: amlodipine , metoprolol , valsartan , and hydrochlorothiazide . - Educate on proper blood pressure monitoring technique, ensuring the cuff  is at heart level and resting on a surface. - Advise to seek emergency care if feeling lightheaded or dizzy with low blood pressure readings. - Consider hydralazine  as an alternative if clonidine  is not tolerated.      Relevant Medications   cloNIDine  (CATAPRES ) 0.1 MG tablet    Assessment & Plan    General Health Maintenance Discussion of lifestyle modifications including diet and exercise. She reports dietary changes and consideration of exercise for overall health improvement. - Encourage continuation of dietary modifications, including reduced soda intake and consumption of Lean Cuisine meals. - Discuss potential benefits of regular exercise, such as walking, for cardiovascular health.     Return in about 1 month (around 02/19/2024) for HTN after med change.         Rockie Agent, MD  [facility] [phone] (phone) [phone] (fax)  [facility]

## 2024-01-21 NOTE — Assessment & Plan Note (Signed)
 Hypertension Chronic hypertension with recent elevated readings. Home readings of 154/64 and office reading of 163/68. Current medications include amlodipine , metoprolol , valsartan , and hydrochlorothiazide . Blood pressure management is challenging despite current regimen. Consideration of adding clonidine  to control systolic blood pressure. Clonidine  is a potent medication that can effectively lower blood pressure but requires careful monitoring to avoid hypotension. - Add clonidine  0.1 mg once daily in the morning. - Instruct to monitor blood pressure after taking current medications and before taking clonidine . - If blood pressure is 150/90 or higher, take clonidine . - If blood pressure drops to 90/50 or lower, discontinue clonidine  and seek medical advice. - Continue current medications: amlodipine , metoprolol , valsartan , and hydrochlorothiazide . - Educate on proper blood pressure monitoring technique, ensuring the cuff is at heart level and resting on a surface. - Advise to seek emergency care if feeling lightheaded or dizzy with low blood pressure readings. - Consider hydralazine  as an alternative if clonidine  is not tolerated.

## 2024-01-29 ENCOUNTER — Ambulatory Visit

## 2024-01-30 ENCOUNTER — Other Ambulatory Visit: Payer: Self-pay | Admitting: Family Medicine

## 2024-01-31 ENCOUNTER — Ambulatory Visit: Admitting: Family Medicine

## 2024-02-02 ENCOUNTER — Inpatient Hospital Stay

## 2024-02-05 ENCOUNTER — Inpatient Hospital Stay

## 2024-02-05 ENCOUNTER — Ambulatory Visit

## 2024-02-09 ENCOUNTER — Inpatient Hospital Stay: Attending: Internal Medicine

## 2024-02-09 DIAGNOSIS — E538 Deficiency of other specified B group vitamins: Secondary | ICD-10-CM | POA: Insufficient documentation

## 2024-02-09 DIAGNOSIS — G4730 Sleep apnea, unspecified: Secondary | ICD-10-CM | POA: Diagnosis not present

## 2024-02-09 DIAGNOSIS — Z79899 Other long term (current) drug therapy: Secondary | ICD-10-CM | POA: Insufficient documentation

## 2024-02-12 ENCOUNTER — Ambulatory Visit

## 2024-02-16 ENCOUNTER — Inpatient Hospital Stay

## 2024-02-16 DIAGNOSIS — Z79899 Other long term (current) drug therapy: Secondary | ICD-10-CM | POA: Diagnosis not present

## 2024-02-16 DIAGNOSIS — E538 Deficiency of other specified B group vitamins: Secondary | ICD-10-CM | POA: Diagnosis not present

## 2024-02-16 MED ORDER — CYANOCOBALAMIN 1000 MCG/ML IJ SOLN
1000.0000 ug | Freq: Once | INTRAMUSCULAR | Status: AC
  Administered 2024-02-16: 1000 ug via INTRAMUSCULAR
  Filled 2024-02-16: qty 1

## 2024-02-20 ENCOUNTER — Ambulatory Visit

## 2024-02-20 ENCOUNTER — Ambulatory Visit: Admitting: Family Medicine

## 2024-03-01 ENCOUNTER — Telehealth: Payer: Self-pay | Admitting: Oncology

## 2024-03-01 ENCOUNTER — Inpatient Hospital Stay

## 2024-03-01 NOTE — Telephone Encounter (Signed)
 Pt was scheduled for b12 today but just had it last on 8/22 and her injections are to be monthly. I have r/s the appt to reflect the monthly scheduling and changed the next appt as well. Pt aware.

## 2024-03-15 ENCOUNTER — Ambulatory Visit: Payer: Self-pay

## 2024-03-15 NOTE — Telephone Encounter (Signed)
 FYI Only or Action Required?: FYI only for provider.  Patient was last seen in primary care on 01/19/2024 by Sharma Coyer, MD.  Called Nurse Triage reporting Joint Swelling.  Symptoms began several days ago.  Interventions attempted: Nothing.  Symptoms are: stable.  Triage Disposition: See PCP When Office is Open (Within 3 Days)  Patient/caregiver understands and will follow disposition?: Yes Reason for Disposition  MODERATE ankle swelling (e.g., interferes with normal activities, can't move joint normally) (Exceptions: Itchy, localized swelling; swelling is chronic.)  Answer Assessment - Initial Assessment Questions Patient's daughter Kristin Mora calling on behalf of patient. States she has not had any ankle/feet swelling previously.  1. LOCATION: Which ankle is swollen? Where is the swelling?     Bilateral ankles, more so the left  2. ONSET: When did the swelling start?     Unsure, but know its bothering   3. SWELLING: How bad is the swelling? Or, How large is it? (e.g., mild, moderate, severe; size of localized swelling)      Moderate  4. PAIN: Is there any pain? If Yes, ask: How bad is it? (Scale 0-10; or none, mild, moderate, severe)     No  5. CAUSE: What do you think caused the ankle swelling?     New medications   6. OTHER SYMPTOMS: Do you have any other symptoms? (e.g., fever, chest pain, difficulty breathing, calf pain)     No  Protocols used: Ankle Swelling-A-AH  Copied from CRM [id number]. Topic: Clinical - Red Word Triage >> Mar 15, 2024  3:24 PM Kristin Mora wrote: Red Word that prompted transfer to Nurse Triage: swelling on her ankles

## 2024-03-18 ENCOUNTER — Other Ambulatory Visit: Payer: Self-pay | Admitting: Family Medicine

## 2024-03-18 ENCOUNTER — Ambulatory Visit: Admitting: Family Medicine

## 2024-03-18 DIAGNOSIS — E7800 Pure hypercholesterolemia, unspecified: Secondary | ICD-10-CM

## 2024-03-18 NOTE — Telephone Encounter (Signed)
 Patient call note and symptoms reviewed. Agree with scheduled appt. Will evaluate during OV

## 2024-03-20 ENCOUNTER — Telehealth: Payer: Self-pay | Admitting: *Deleted

## 2024-03-20 NOTE — Telephone Encounter (Signed)
 Wants to know if she is getting her next B12 and it is on 9/26 time is 10:00 for it.  Patient says thank you.

## 2024-03-22 ENCOUNTER — Inpatient Hospital Stay: Attending: Internal Medicine

## 2024-03-22 DIAGNOSIS — E538 Deficiency of other specified B group vitamins: Secondary | ICD-10-CM | POA: Diagnosis not present

## 2024-03-22 DIAGNOSIS — Z79899 Other long term (current) drug therapy: Secondary | ICD-10-CM | POA: Insufficient documentation

## 2024-03-22 MED ORDER — CYANOCOBALAMIN 1000 MCG/ML IJ SOLN
1000.0000 ug | Freq: Once | INTRAMUSCULAR | Status: AC
  Administered 2024-03-22: 1000 ug via INTRAMUSCULAR
  Filled 2024-03-22: qty 1

## 2024-03-29 ENCOUNTER — Inpatient Hospital Stay

## 2024-04-16 ENCOUNTER — Telehealth: Payer: Self-pay

## 2024-04-16 NOTE — Telephone Encounter (Signed)
 Copied from CRM #[id number]. Topic: Clinical - Medication Question >> Apr 15, 2024  9:56 AM Myrick T wrote: Reason for CRM: Patients daughter Darice called to go through all the medications patient takes. Please f/u with daughter

## 2024-04-17 ENCOUNTER — Other Ambulatory Visit: Payer: Self-pay | Admitting: Family Medicine

## 2024-04-17 DIAGNOSIS — E1159 Type 2 diabetes mellitus with other circulatory complications: Secondary | ICD-10-CM

## 2024-04-17 MED ORDER — AMLODIPINE BESYLATE 10 MG PO TABS: 10.0000 mg | ORAL_TABLET | Freq: Every day | ORAL | 1 refills | Status: DC

## 2024-04-17 NOTE — Telephone Encounter (Signed)
 Spoke with Kristin Mora went over medication list with her. Per note in July, states to continue with Amlodipine , metoprolol , valsartan -hydrochlorothiazide  and addition to clonidine . Per daughter mother has not been on Amlodipine  in for a while, Kristin Mora would like to know does [place] need to be on amlodipine  as well. If so, a rx needs to be sent in. I advised Kristin Mora we would let her know or send in rx if provider stated yes pt should be on medicine. Please advise.

## 2024-04-17 NOTE — Telephone Encounter (Signed)
 Rx sent for amlodipine

## 2024-04-18 NOTE — Telephone Encounter (Signed)
 Spoke with Darice, she will pick up rx and have patient resume taking.

## 2024-04-22 ENCOUNTER — Ambulatory Visit: Payer: Self-pay

## 2024-04-22 ENCOUNTER — Encounter: Payer: Self-pay | Admitting: Internal Medicine

## 2024-04-22 NOTE — Telephone Encounter (Signed)
 FYI Only or Action Required?: FYI only for provider.  Patient was last seen in primary care on 01/19/2024 by Sharma Coyer, MD.  Called Nurse Triage reporting Headache.  Symptoms began today.  Interventions attempted: Nothing.  Symptoms are: gradually worsening.  Triage Disposition: See Physician Within 24 Hours  Patient/caregiver understands and will follow disposition?: Yes    Copied from CRM #[id number]. Topic: Clinical - Red Word Triage >> Apr 22, 2024  5:02 PM Kristin Mora wrote: Pt has been having headache for the last two days, Daughter Kristin Mora) is on the line and is nervous as she can tell mom is in pain when she talks. It seems she struggling to manage the pain and talk at the same time. Mom also had stokes in the past and the daughter would like to have her checked out to be sure its nothing serious Reason for Disposition  [1] MODERATE headache (e.g., interferes with normal activities) AND [2] present > 24 hours AND [3] unexplained  (Exceptions: Pain medicines not tried, typical migraine, or headache part of viral illness.)    Just took pain medication therefore has not waited to determine if any relief  Answer Assessment - Initial Assessment Questions 1. LOCATION: Where does it hurt?      headache 2. ONSET: When did the headache start? (e.g., minutes, hours, days)      today 3. PATTERN: Does the pain come and go, or has it been constant since it started?     constant 4. SEVERITY: How bad is the pain? and What does it keep you from doing?  (e.g., Scale 1-10; mild, moderate, or severe)     7/10 5. RECURRENT SYMPTOM: Have you ever had headaches before? If Yes, ask: When was the last time? and What happened that time?      no 6. CAUSE: What do you think is causing the headache?     unknown 7. MIGRAINE: Have you been diagnosed with migraine headaches? If Yes, ask: Is this headache similar?      na 8. HEAD INJURY: Has there been any recent  injury to your head?      no 9. OTHER SYMPTOMS: Do you have any other symptoms? (e.g., fever, stiff neck, eye pain, sore throat, cold symptoms)     no 10. PREGNANCY: Is there any chance you are pregnant? When was your last menstrual period?       Na  History stroke  Protocols used: Headache-A-AH

## 2024-04-23 ENCOUNTER — Ambulatory Visit: Admitting: Family Medicine

## 2024-04-23 ENCOUNTER — Encounter: Payer: Self-pay | Admitting: Family Medicine

## 2024-04-23 VITALS — BP 150/70 | HR 64 | Ht 62.0 in | Wt 235.9 lb

## 2024-04-23 DIAGNOSIS — E559 Vitamin D deficiency, unspecified: Secondary | ICD-10-CM | POA: Diagnosis not present

## 2024-04-23 DIAGNOSIS — G44209 Tension-type headache, unspecified, not intractable: Secondary | ICD-10-CM

## 2024-04-23 DIAGNOSIS — E1159 Type 2 diabetes mellitus with other circulatory complications: Secondary | ICD-10-CM

## 2024-04-23 DIAGNOSIS — Z23 Encounter for immunization: Secondary | ICD-10-CM

## 2024-04-23 DIAGNOSIS — E785 Hyperlipidemia, unspecified: Secondary | ICD-10-CM | POA: Diagnosis not present

## 2024-04-23 DIAGNOSIS — Z7984 Long term (current) use of oral hypoglycemic drugs: Secondary | ICD-10-CM

## 2024-04-23 DIAGNOSIS — E1169 Type 2 diabetes mellitus with other specified complication: Secondary | ICD-10-CM | POA: Diagnosis not present

## 2024-04-23 DIAGNOSIS — E538 Deficiency of other specified B group vitamins: Secondary | ICD-10-CM

## 2024-04-23 DIAGNOSIS — I152 Hypertension secondary to endocrine disorders: Secondary | ICD-10-CM | POA: Diagnosis not present

## 2024-04-23 DIAGNOSIS — G40909 Epilepsy, unspecified, not intractable, without status epilepticus: Secondary | ICD-10-CM

## 2024-04-23 DIAGNOSIS — E119 Type 2 diabetes mellitus without complications: Secondary | ICD-10-CM

## 2024-04-23 DIAGNOSIS — F0150 Vascular dementia without behavioral disturbance: Secondary | ICD-10-CM

## 2024-04-23 MED ORDER — AMLODIPINE BESYLATE 10 MG PO TABS
5.0000 mg | ORAL_TABLET | Freq: Every day | ORAL | Status: DC
Start: 2024-04-23 — End: 2024-04-24

## 2024-04-23 NOTE — Assessment & Plan Note (Signed)
 Chronic condition  Well controlled last year  Type 2 diabetes mellitus with last A1c of 5.8% eleven months ago. Current medication regimen includes metformin  and semaglutide . A1c needs updating. - Perform point of care hemoglobin A1c test - Continue metformin  500 mg twice daily - Continue semaglutide  2 mg weekly

## 2024-04-23 NOTE — Assessment & Plan Note (Signed)
 Chronic  No current medications prescribed for this  Seizure disorder with no recent seizure activity reported. No neurological deficits noted on exam. - Refer to neurology for evaluation of seizure disorder

## 2024-04-23 NOTE — Assessment & Plan Note (Signed)
 Chronic condition  Vascular dementia contributing to forgetfulness and medication adherence issues. High vascular risk due to history of stroke, hypertension, and diabetes. - Refer to neurology for evaluation of vascular dementia and memory concerns - Ensure medication adherence by organizing medications

## 2024-04-23 NOTE — Assessment & Plan Note (Signed)
 Vitamin D  deficiency Chronic vitamin D  deficiency. Currently managed with vitamin D  supplementation. - Continue vitamin D  50,000 units weekly

## 2024-04-23 NOTE — Progress Notes (Signed)
 ACUTE VISIT   Patient: Kristin Mora   DOB: 1955/10/13   68 y.o. Female  MRN: [id number]   PCP: Sharma Coyer, MD  Chief Complaint  Patient presents with   Headache    Patient reports headache resolved this morning, started yesterday    Subjective    HPI HPI     Headache    Additional comments: Patient reports headache resolved this morning, started yesterday       Last edited by Cherry Chiquita HERO, CMA on 04/23/2024  8:17 AM.       Discussed the use of AI scribe software for clinical note transcription with the patient, who gave verbal consent to proceed.  History of Present Illness Kristin Mora is a 68 year old female with hypertension who presents with an acute headache.  She experienced an acute headache that began yesterday and has since resolved. The headache was described as 'a little bad' and was managed with two Tylenol  tablets. No seizure activity, confusion, weakness, vision changes, or falls were associated with the headache. She did report sensitivity to light during the episode.  She has a history of hypertension, and her home blood pressure readings have been in the 140s and 150s. Her home blood pressure readings have been in the 140s and 150s. Her antihypertensive medications have included amlodipine , metoprolol , clonidine , and valsartan -hydrochlorothiazide . There is concern about medication adherence due to missed doses in the past.  Her past medical history includes diabetes, with the last A1c recorded at 5.8% eleven months ago. She is on metformin  500 mg twice daily and semaglutide  2 mg weekly.  She has a history of vascular dementia and seizure disorder. Her daughter notes increased forgetfulness, impacting medication adherence. She also has a history of a retinal hemorrhage in her left eye, which occurred two months ago, and she has since regained some vision.  She is on vitamin D  50,000 units weekly for vitamin D  deficiency and has a  history of hyperlipidemia, managed with Lipitor 8 mg daily.     Medications: Outpatient Medications Prior to Visit  Medication Sig   atorvastatin  (LIPITOR) 80 MG tablet Take 1 tablet by mouth once daily   Blood Pressure Monitoring (BLOOD PRESSURE DIGITAL SOLN) KIT Dispense one automatic/digital blood pressure machine; check blood pressure twice daily in AM and PM   cloNIDine  (CATAPRES ) 0.1 MG tablet Take 1 tablet (0.1 mg total) by mouth daily.   hydrocortisone  1 % lotion Apply 1 Application topically 2 (two) times daily. For itching   ketoconazole  (NIZORAL ) 2 % cream Apply 1 Application topically daily. On R armpit, under R breast   lactulose  (CHRONULAC ) 10 GM/15ML solution Take 15 mLs (10 g total) by mouth 2 (two) times daily as needed for severe constipation.   lidocaine  (LIDODERM ) 5 % Place 1 patch onto the skin daily. Remove & Discard patch within 12 hours or as directed by MD   metFORMIN  (GLUCOPHAGE ) 500 MG tablet Take 1 tablet (500 mg total) by mouth 2 (two) times daily with a meal.   metoprolol  succinate (TOPROL -XL) 25 MG 24 hr tablet Take 1 tablet (25 mg total) by mouth daily.   oxybutynin  (DITROPAN ) 5 MG tablet Take 1 tablet (5 mg total) by mouth 2 (two) times daily.   Semaglutide , 2 MG/DOSE, (OZEMPIC , 2 MG/DOSE,) 8 MG/3ML SOPN INJECT 2MG   SUBCUTANEOUSLY ONCE A WEEK   terbinafine  (LAMISIL ) 1 % cream Apply 1 Application topically 2 (two) times daily. To affected areas of rash under  breast and under arms   triamcinolone  (KENALOG ) 0.025 % ointment Apply 1 Application topically 2 (two) times daily.   valsartan -hydrochlorothiazide  (DIOVAN -HCT) 320-25 MG tablet TAKE 1 TABLET BY MOUTH ONCE DAILY. TAKE IN PLACE OF LOSARTAN -HCTZ   Vitamin D , Ergocalciferol , (DRISDOL ) 1.25 MG (50000 UNIT) CAPS capsule Take 1 capsule by mouth once a week   [DISCONTINUED] amLODipine  (NORVASC ) 10 MG tablet Take 1 tablet (10 mg total) by mouth daily.   [DISCONTINUED] Miconazole -Zinc  Oxide-Petrolat 0.25-15-81.35 %  OINT Apply to affected skin twice a day until clear   No facility-administered medications prior to visit.    Last metabolic panel Lab Results  Component Value Date   GLUCOSE 91 12/25/2023   NA 139 12/25/2023   K 4.0 12/25/2023   CL 103 12/25/2023   CO2 28 12/25/2023   BUN 16 12/25/2023   CREATININE 0.69 12/25/2023   GFRNONAA >60 12/25/2023   CALCIUM  9.3 12/25/2023   PROT 6.9 12/25/2023   ALBUMIN 3.3 (L) 12/25/2023   LABGLOB 3.1 05/17/2023   AGRATIO 1.3 07/08/2022   BILITOT 1.1 12/25/2023   ALKPHOS 51 12/25/2023   AST 19 12/25/2023   ALT 16 12/25/2023   ANIONGAP 8 12/25/2023   Last lipids Lab Results  Component Value Date   CHOL 121 05/17/2023   HDL 36 (L) 05/17/2023   LDLCALC 66 05/17/2023   TRIG 103 05/17/2023   CHOLHDL 3.4 05/17/2023   Last hemoglobin A1c Lab Results  Component Value Date   HGBA1C 5.8 (H) 05/17/2023        Objective    BP (!) 150/70 (Cuff Size: Large)   Pulse 64   Ht 5' 2 (1.575 m)   Wt 235 lb 14.4 oz (107 kg)   SpO2 97%   BMI 43.15 kg/m   BP Readings from Last 3 Encounters:  04/23/24 (!) 150/70  01/19/24 (!) 148/80  12/27/23 (!) 182/93   Wt Readings from Last 3 Encounters:  04/23/24 235 lb 14.4 oz (107 kg)  01/19/24 234 lb (106.1 kg)  12/27/23 232 lb 9.6 oz (105.5 kg)      Physical Exam   Physical Exam VITALS: BP- 150/70 MEASUREMENTS: BMI- 43.0. NEUROLOGICAL: Cranial nerves grossly intact. Alert and oriented. Extraocular movements intact. No facial asymmetry. Normal range of motion and strength in bilateral upper extremities.   No results found for any visits on 04/23/24.  Assessment & Plan      Assessment & Plan  Problem List Items Addressed This Visit     Avitaminosis D   Vitamin D  deficiency Chronic vitamin D  deficiency. Currently managed with vitamin D  supplementation. - Continue vitamin D  50,000 units weekly      B12 deficiency   Chronic condition  Follow up with hematology as scheduled  Next b12  injection scheduled for 04/26/24      Diabetes mellitus, type 2 (HCC)   Chronic condition  Well controlled last year  Type 2 diabetes mellitus with last A1c of 5.8% eleven months ago. Current medication regimen includes metformin  and semaglutide . A1c needs updating. - Perform point of care hemoglobin A1c test - Continue metformin  500 mg twice daily - Continue semaglutide  2 mg weekly      Hyperlipidemia associated with type 2 diabetes mellitus (HCC)   Chronic hyperlipidemia associated with diabetes. Currently managed with Lipitor. - Continue Lipitor 8 mg daily      Relevant Medications   amLODipine  (NORVASC ) 10 MG tablet   Hypertension associated with diabetes (HCC) - Primary   Chronic hypertension with current blood  pressure reading of 180/77 mmHg in the office, typically 140s to 150s at home. Medication adherence concerns due to forgetfulness, possibly related to vascular dementia. High vascular risk due to history of stroke, hypertension, and diabetes. BP improved on recheck but still above goal range of less than 150/90 - Continue current antihypertensive regimen - Reduce amlodipine  to 5 mg daily due to potential swelling at 10 mg dose - Monitor blood pressure regularly - Ensure medication adherence by organizing medications - Refer to neurology for evaluation of memory concerns and medication adherence      Relevant Medications   amLODipine  (NORVASC ) 10 MG tablet   Seizure disorder (HCC)   Chronic  No current medications prescribed for this  Seizure disorder with no recent seizure activity reported. No neurological deficits noted on exam. - Refer to neurology for evaluation of seizure disorder      Relevant Orders   Ambulatory referral to Neurology   Vascular dementia, uncomplicated (HCC)   Chronic condition  Vascular dementia contributing to forgetfulness and medication adherence issues. High vascular risk due to history of stroke, hypertension, and diabetes. - Refer  to neurology for evaluation of vascular dementia and memory concerns - Ensure medication adherence by organizing medications      Relevant Medications   amLODipine  (NORVASC ) 10 MG tablet   Other Relevant Orders   Ambulatory referral to Neurology   Other Visit Diagnoses       Acute non intractable tension-type headache       Relevant Medications   amLODipine  (NORVASC ) 10 MG tablet     Need for immunization against influenza       Relevant Orders   Flu vaccine HIGH DOSE PF(Fluzone Trivalent)        Stroke History of stroke with high vascular risk due to hypertension, diabetes, and vascular dementia. - Refer to neurology for evaluation of stroke history and vascular risk  Chronic vision changes secondary to retinal hemorrhage Chronic vision changes due to retinal hemorrhage.  Acute resolved headache Acute headache that started yesterday and has since resolved. No associated seizure activity, confusion, weakness, or vision changes. No recent falls. Sensitive to light during the headache. No acute neurological deficits noted on exam. - Instruct to take Tylenol  as needed for headache, up to 4 grams per day  Health maint  Influenza vaccine was administered today  Patient tolerated well    Return in about 3 months (around 07/24/2024) for DM, HTN,vasc dementia .        Rockie Agent, MD  [facility] [phone] (phone) [phone] (fax)  [facility]

## 2024-04-23 NOTE — Assessment & Plan Note (Addendum)
 Chronic condition  Follow up with hematology as scheduled  Next b12 injection scheduled for 04/26/24

## 2024-04-23 NOTE — Assessment & Plan Note (Signed)
 Chronic hyperlipidemia associated with diabetes. Currently managed with Lipitor. - Continue Lipitor 8 mg daily

## 2024-04-23 NOTE — Assessment & Plan Note (Signed)
 Chronic hypertension with current blood pressure reading of 180/77 mmHg in the office, typically 140s to 150s at home. Medication adherence concerns due to forgetfulness, possibly related to vascular dementia. High vascular risk due to history of stroke, hypertension, and diabetes. BP improved on recheck but still above goal range of less than 150/90 - Continue current antihypertensive regimen - Reduce amlodipine  to 5 mg daily due to potential swelling at 10 mg dose - Monitor blood pressure regularly - Ensure medication adherence by organizing medications - Refer to neurology for evaluation of memory concerns and medication adherence

## 2024-04-23 NOTE — Patient Instructions (Signed)
 To keep you healthy, please keep in mind the following health maintenance items that you are due for:   Health Maintenance Due  Topic Date Due   DEXA SCAN  Never done   Mammogram  01/22/2023   FOOT EXAM  04/02/2023   HEMOGLOBIN A1C  11/14/2023   Medicare Annual Wellness (AWV)  01/09/2024   Influenza Vaccine  01/26/2024   Diabetic kidney evaluation - Urine ACR  05/16/2024     Best Wishes,   Dr. Lang

## 2024-04-24 ENCOUNTER — Other Ambulatory Visit: Payer: Self-pay

## 2024-04-24 DIAGNOSIS — I152 Hypertension secondary to endocrine disorders: Secondary | ICD-10-CM

## 2024-04-24 MED ORDER — AMLODIPINE BESYLATE 5 MG PO TABS: 5.0000 mg | ORAL_TABLET | Freq: Every day | ORAL | 1 refills | Status: AC

## 2024-04-26 ENCOUNTER — Inpatient Hospital Stay

## 2024-05-02 ENCOUNTER — Telehealth: Payer: Self-pay

## 2024-05-02 NOTE — Progress Notes (Signed)
 Pharmacy Quality Measure Review  This patient is appearing on a report for being at risk of failing the adherence measure for cholesterol (statin) medications this calendar year.   Medication: atorvastatin  Last fill date: 12/21/23 for 90 day supply  Spoke with pharmacy to confirm last fill date. Patient reported daughter filled this medication this week, however, explained to patient it has not been filled per pharmacy since June. Patient reports having some medication in stock. Discussed adherence methods over the phone. No action at this time.   Akshara Blumenthal E. Marsh, PharmD, CPP Clinical Pharmacist [facility] [phone]

## 2024-05-03 ENCOUNTER — Inpatient Hospital Stay: Attending: Internal Medicine

## 2024-05-03 VITALS — BP 162/95 | HR 73 | Temp 97.4°F | Resp 16

## 2024-05-03 DIAGNOSIS — E538 Deficiency of other specified B group vitamins: Secondary | ICD-10-CM | POA: Insufficient documentation

## 2024-05-03 DIAGNOSIS — Z79899 Other long term (current) drug therapy: Secondary | ICD-10-CM | POA: Insufficient documentation

## 2024-05-03 MED ORDER — CYANOCOBALAMIN 1000 MCG/ML IJ SOLN
1000.0000 ug | Freq: Once | INTRAMUSCULAR | Status: AC
  Administered 2024-05-03: 1000 ug via INTRAMUSCULAR
  Filled 2024-05-03: qty 1

## 2024-05-03 NOTE — Patient Instructions (Signed)
 Vitamin B12 Injection What is this medication? Vitamin B12 (VAHY tuh min B12) prevents and treats low vitamin B12 levels in your body. It is used in people who do not get enough vitamin B12 from their diet or when their digestive tract does not absorb enough. Vitamin B12 plays an important role in maintaining the health of your nervous system and red blood cells. This medicine may be used for other purposes; ask your health care provider or pharmacist if you have questions. COMMON BRAND NAME(S): B-12 Compliance Kit, B-12 Injection Kit, Cyomin, Dodex , LA-12, Nutri-Twelve, Physicians EZ Use B-12, Primabalt, Vitamin Deficiency Injectable System - B12 What should I tell my care team before I take this medication? They need to know if you have any of these conditions: Kidney disease Leber's disease Megaloblastic anemia An unusual or allergic reaction to cyanocobalamin , cobalt, other medications, foods, dyes, or preservatives Pregnant or trying to get pregnant Breast-feeding How should I use this medication? This medication is injected into a muscle or deeply under the skin. It is usually given in a clinic or care team's office. However, your care team may teach you how to inject yourself. Follow all instructions. Talk to your care team about the use of this medication in children. Special care may be needed. Overdosage: If you think you have taken too much of this medicine contact a poison control center or emergency room at once. NOTE: This medicine is only for you. Do not share this medicine with others. What if I miss a dose? If you are given your dose at a clinic or care team's office, call to reschedule your appointment. If you give your own injections, and you miss a dose, take it as soon as you can. If it is almost time for your next dose, take only that dose. Do not take double or extra doses. What may interact with this medication? Alcohol Colchicine This list may not describe all possible  interactions. Give your health care provider a list of all the medicines, herbs, non-prescription drugs, or dietary supplements you use. Also tell them if you smoke, drink alcohol, or use illegal drugs. Some items may interact with your medicine. What should I watch for while using this medication? Visit your care team regularly. You may need blood work done while you are taking this medication. You may need to follow a special diet. Talk to your care team. Limit your alcohol intake and avoid smoking to get the best benefit. What side effects may I notice from receiving this medication? Side effects that you should report to your care team as soon as possible: Allergic reactions--skin rash, itching, hives, swelling of the face, lips, tongue, or throat Swelling of the ankles, hands, or feet Trouble breathing Side effects that usually do not require medical attention (report to your care team if they continue or are bothersome): Diarrhea This list may not describe all possible side effects. Call your doctor for medical advice about side effects. You may report side effects to FDA at 1-800-FDA-1088. Where should I keep my medication? Keep out of the reach of children. Store at room temperature between 15 and 30 degrees C (59 and 85 degrees F). Protect from light. Throw away any unused medication after the expiration date. NOTE: This sheet is a summary. It may not cover all possible information. If you have questions about this medicine, talk to your doctor, pharmacist, or health care provider.  2024 Elsevier/Gold Standard (2021-02-23 00:00:00)

## 2024-05-04 ENCOUNTER — Other Ambulatory Visit: Payer: Self-pay | Admitting: Family Medicine

## 2024-05-04 DIAGNOSIS — E559 Vitamin D deficiency, unspecified: Secondary | ICD-10-CM

## 2024-05-06 NOTE — Telephone Encounter (Signed)
 Requested medication (s) are due for refill today: yes  Requested medication (s) are on the active medication list: yes  Last refill:  01/17/24  Future visit scheduled: no  Notes to clinic:   Manual Review: Route requests for 50,000 IU strength to the provider      Requested Prescriptions  Pending Prescriptions Disp Refills   Vitamin D , Ergocalciferol , (DRISDOL ) 1.25 MG (50000 UNIT) CAPS capsule [Pharmacy Med Name: Vitamin D  (Ergocalciferol ) 1.25 MG (50000 UT) Oral Capsule] 12 capsule 0    Sig: Take 1 capsule by mouth once a week     Endocrinology:  Vitamins - Vitamin D  Supplementation 2 Failed - 05/06/2024  4:22 PM      Failed - Manual Review: Route requests for 50,000 IU strength to the provider      Passed - Ca in normal range and within 360 days    Calcium   Date Value Ref Range Status  12/25/2023 9.3 8.9 - 10.3 mg/dL Final   Calcium , Total  Date Value Ref Range Status  10/01/2014 9.5 mg/dL Final    Comment:    1.0-89.6 NOTE: New Reference Range  09/02/14          Passed - Vitamin D  in normal range and within 360 days    Vit D, 25-Hydroxy  Date Value Ref Range Status  05/17/2023 35.6 30.0 - 100.0 ng/mL Final    Comment:    Vitamin D  deficiency has been defined by the Institute of Medicine and an Endocrine Society practice guideline as a level of serum 25-OH vitamin D  less than 20 ng/mL (1,2). The Endocrine Society went on to further define vitamin D  insufficiency as a level between 21 and 29 ng/mL (2). 1. IOM (Institute of Medicine). 2010. Dietary reference    intakes for calcium  and D. [place]: The    [employer]. 2. Holick MF, Binkley [place], Bischoff-Ferrari HA, et al.    Evaluation, treatment, and prevention of vitamin D     deficiency: an Endocrine Society clinical practice    guideline. JCEM. 2011 Jul; 96(7):1911-30.          Passed - Valid encounter within last 12 months    Recent Outpatient Visits           1 week ago Hypertension  associated with diabetes [facility])   [place] [facility] Simmons-Robinson, [place], MD   3 months ago Hypertension associated with diabetes [facility])   [place] [facility] Simmons-Robinson, [place], MD   4 months ago Hypertension associated with diabetes [facility])   [place] [facility] Gasper Nancyann BRAVO, MD   6 months ago Vaginal odor   [place] [facility] [place], [place], MD

## 2024-05-08 NOTE — Progress Notes (Addendum)
 Kristin Mora                                          MRN: [id number]   05/08/2024   The [facility] Quality Team Specialist reviewed this patient medical record for the purposes of chart review for care gap closure. The following were reviewed: chart review for care gap closure-kidney health evaluation for diabetes:eGFR  and uACR.  06/03/2024- chart reviewed again   [facility] Quality Team

## 2024-05-10 ENCOUNTER — Inpatient Hospital Stay

## 2024-05-10 ENCOUNTER — Inpatient Hospital Stay: Admitting: Oncology

## 2024-05-15 ENCOUNTER — Other Ambulatory Visit: Payer: Self-pay | Admitting: Family Medicine

## 2024-05-15 DIAGNOSIS — E559 Vitamin D deficiency, unspecified: Secondary | ICD-10-CM

## 2024-05-16 NOTE — Telephone Encounter (Signed)
 LOV- 04/23/2024 NOV- None LRF- 01/17/2024 Outpatient Medication Detail   Disp Refills Start End   Vitamin D , Ergocalciferol , (DRISDOL ) 1.25 MG (50000 UNIT) CAPS capsule 12 capsule 0 01/17/2024 --   Sig - Route: Take 1 capsule by mouth once a week - Oral   Sent to pharmacy as: Vitamin D , Ergocalciferol , (DRISDOL ) 1.25 MG (50000 UNIT) Cap capsule   E-Prescribing Status: Receipt confirmed by pharmacy (01/17/2024  1:00 PM EDT)

## 2024-05-28 ENCOUNTER — Ambulatory Visit

## 2024-05-28 DIAGNOSIS — N3941 Urge incontinence: Secondary | ICD-10-CM

## 2024-05-28 DIAGNOSIS — Z0000 Encounter for general adult medical examination without abnormal findings: Secondary | ICD-10-CM

## 2024-05-28 NOTE — Patient Instructions (Addendum)
 Kristin Mora,  Thank you for taking the time for your Medicare Wellness Visit. I appreciate your continued commitment to your health goals. Please review the care plan we discussed, and feel free to reach out if I can assist you further.  Please note that Annual Wellness Visits do not include a physical exam. Some assessments may be limited, especially if the visit was conducted virtually. If needed, we may recommend an in-person follow-up with your provider.  Ongoing Care Seeing your primary care provider every 3 to 6 months helps us  monitor your health and provide consistent, personalized care.   Referrals If a referral was made during today's visit and you haven't received any updates within two weeks, please contact the referred provider directly to check on the status.  YOU HAVE REFERRALS FOR MAMMOGRAM & BONE DENSITY SCAN; PLEASE CALL [phone] TO SET UP YOUR APPOINTMENTS  Recommended Screenings:  Health Maintenance  Topic Date Due   Osteoporosis screening with Bone Density Scan  Never done   Complete foot exam   04/02/2023   Hemoglobin A1C  11/14/2023   Breast Cancer Screening  01/22/2024   Yearly kidney health urinalysis for diabetes  05/16/2024   Yearly kidney function blood test for diabetes  12/24/2024   Eye exam for diabetics  01/14/2025   Colon Cancer Screening  01/19/2025   Medicare Annual Wellness Visit  05/28/2025   DTaP/Tdap/Td vaccine (3 - Td or Tdap) 02/23/2033   Pneumococcal Vaccine for age over 42  Completed   Flu Shot  Completed   Hepatitis C Screening  Completed   Zoster (Shingles) Vaccine  Completed   Meningitis B Vaccine  Aged Out   Screening for Lung Cancer  Discontinued   Hepatitis B Vaccine  Discontinued   COVID-19 Vaccine  Discontinued    Vision: Annual vision screenings are recommended for early detection of glaucoma, cataracts, and diabetic retinopathy. These exams can also reveal signs of chronic conditions such as diabetes and high blood  pressure.  Dental: Annual dental screenings help detect early signs of oral cancer, gum disease, and other conditions linked to overall health, including heart disease and diabetes.  Please see the attached documents for additional preventive care recommendations.   NEXT AWV 06/03/25 @ 1:10 PM IN PERSON

## 2024-05-28 NOTE — Progress Notes (Signed)
 No chief complaint on file.  I connected with  Kristin Mora on 05/28/24 by a audio enabled telemedicine application and verified that I am speaking with the correct person using two identifiers.  Patient Location: Home  Provider Location: Home Office  Persons Participating in Visit: Patient.  I discussed the limitations of evaluation and management by telemedicine. The patient expressed understanding and agreed to proceed.   Vital Signs: Because this visit was a virtual/telehealth visit, some criteria may be missing or patient reported. Any vitals not documented were not able to be obtained and vitals that have been documented are patient reported.       Subjective:   Kristin Mora is a 68 y.o. female who presents for a Medicare Annual Wellness Visit.  Fall Screening Falls in the past year?: 0 Number of falls in past year: 0 Was there an injury with Fall?: 0 Fall Risk Category Calculator: 0 Patient Fall Risk Level: High fall risk  Advance Directives (For Healthcare) Does Patient Have a Medical Advance Directive?: No    Allergies (verified) Lisinopril and Latex   Current Medications (verified) Outpatient Encounter Medications as of 05/28/2024  Medication Sig   amLODipine  (NORVASC ) 5 MG tablet Take 1 tablet (5 mg total) by mouth daily.   atorvastatin  (LIPITOR) 80 MG tablet Take 1 tablet by mouth once daily   Blood Pressure Monitoring (BLOOD PRESSURE DIGITAL SOLN) KIT Dispense one automatic/digital blood pressure machine; check blood pressure twice daily in AM and PM   cloNIDine  (CATAPRES ) 0.1 MG tablet Take 1 tablet (0.1 mg total) by mouth daily.   hydrocortisone  1 % lotion Apply 1 Application topically 2 (two) times daily. For itching   ketoconazole  (NIZORAL ) 2 % cream Apply 1 Application topically daily. On R armpit, under R breast   lactulose  (CHRONULAC ) 10 GM/15ML solution Take 15 mLs (10 g total) by mouth 2 (two) times daily as needed for severe constipation.    lidocaine  (LIDODERM ) 5 % Place 1 patch onto the skin daily. Remove & Discard patch within 12 hours or as directed by MD   metFORMIN  (GLUCOPHAGE ) 500 MG tablet Take 1 tablet (500 mg total) by mouth 2 (two) times daily with a meal.   metoprolol  succinate (TOPROL -XL) 25 MG 24 hr tablet Take 1 tablet (25 mg total) by mouth daily.   oxybutynin  (DITROPAN ) 5 MG tablet Take 1 tablet (5 mg total) by mouth 2 (two) times daily.   Semaglutide , 2 MG/DOSE, (OZEMPIC , 2 MG/DOSE,) 8 MG/3ML SOPN INJECT 2MG   SUBCUTANEOUSLY ONCE A WEEK   terbinafine  (LAMISIL ) 1 % cream Apply 1 Application topically 2 (two) times daily. To affected areas of rash under breast and under arms   triamcinolone  (KENALOG ) 0.025 % ointment Apply 1 Application topically 2 (two) times daily.   valsartan -hydrochlorothiazide  (DIOVAN -HCT) 320-25 MG tablet TAKE 1 TABLET BY MOUTH ONCE DAILY. TAKE IN PLACE OF LOSARTAN -HCTZ   Vitamin D , Ergocalciferol , (DRISDOL ) 1.25 MG (50000 UNIT) CAPS capsule Take 1 capsule by mouth once a week   No facility-administered encounter medications on file as of 05/28/2024.    History: Past Medical History:  Diagnosis Date   Asthma    Brain aneurysm 04/2007   Diabetes mellitus without complication (HCC)    controlled;   HLD (hyperlipidemia)    Hypertension    controlled with medication   Stroke [facility])    Vertigo    Past Surgical History:  Procedure Laterality Date   COLONOSCOPY WITH PROPOFOL  N/A 01/19/2022   Procedure: COLONOSCOPY WITH PROPOFOL ;  Surgeon: Therisa Bi, MD;  Location[facility] ENDOSCOPY;  Service: Gastroenterology;  Laterality: N/A;   CRANIOTOMY  04/28/2007   KNEE SURGERY     TONSILLECTOMY     TONSILLECTOMY AND ADENOIDECTOMY     TUBAL LIGATION     Family History  Problem Relation Age of Onset   Heart attack Mother    Coronary artery disease Mother    Lung cancer Father    Diabetes Sister    Heart Problems Sister    Uterine cancer Sister    Lung cancer Sister    Stroke Brother    Bone  cancer Brother    Social History   Occupational History   Occupation: disabled  Tobacco Use   Smoking status: Former    Current packs/day: 0.00    Average packs/day: 2.0 packs/day for 12.0 years (24.0 ttl pk-yrs)    Types: Cigarettes    Start date: 11/25/1997    Quit date: 11/25/2009    Years since quitting: 14.5   Smokeless tobacco: Never  Vaping Use   Vaping status: Never Used  Substance and Sexual Activity   Alcohol use: No    Alcohol/week: 0.0 standard drinks of alcohol   Drug use: No   Sexual activity: Not Currently   Tobacco Counseling Counseling given: Not Answered  SDOH Screenings   Food Insecurity: No Food Insecurity (06/16/2023)  Housing: Low Risk  (06/16/2023)  Transportation Needs: No Transportation Needs (06/16/2023)  Utilities: Not At Risk (06/09/2023)  Alcohol Screen: Low Risk  (01/09/2023)  Depression (PHQ2-9): Low Risk  (06/09/2023)  Financial Resource Strain: Low Risk  (06/16/2023)  Physical Activity: Inactive (06/16/2023)  Social Connections: Moderately Integrated (06/16/2023)  Stress: No Stress Concern Present (06/16/2023)  Tobacco Use: Medium Risk (04/23/2024)  Health Literacy: Adequate Health Literacy (01/09/2023)   See flowsheets for full screening details  Depression Screen PHQ 2 & 9 Depression Scale- Over the past 2 weeks, how often have you been bothered by any of the following problems? Little interest or pleasure in doing things: 0 Feeling down, depressed, or hopeless (PHQ Adolescent also includes...irritable): 0 PHQ-2 Total Score: 0     Goals Addressed   None          Objective:    There were no vitals filed for this visit. There is no height or weight on file to calculate BMI.  Hearing/Vision screen No results found. Immunizations and Health Maintenance Health Maintenance  Topic Date Due   Bone Density Scan  Never done   Mammogram  01/22/2023   FOOT EXAM  04/02/2023   HEMOGLOBIN A1C  11/14/2023   Medicare Annual Wellness  (AWV)  01/09/2024   Diabetic kidney evaluation - Urine ACR  05/16/2024   Diabetic kidney evaluation - eGFR measurement  12/24/2024   OPHTHALMOLOGY EXAM  01/14/2025   Colonoscopy  01/19/2025   DTaP/Tdap/Td (3 - Td or Tdap) 02/23/2033   Pneumococcal Vaccine: 50+ Years  Completed   Influenza Vaccine  Completed   Hepatitis C Screening  Completed   Zoster Vaccines- Shingrix  Completed   Meningococcal B Vaccine  Aged Out   Lung Cancer Screening  Discontinued   Hepatitis B Vaccines 19-59 Average Risk  Discontinued   COVID-19 Vaccine  Discontinued        Assessment/Plan:  This is a routine wellness examination for Kristin Mora.  Patient Care Team: Sharma Coyer, MD as PCP - General (Family Medicine) Darliss Rogue, MD as PCP - Cardiology (Cardiology) Laurice Francis NOVAK, [place] (Optometry) Melanee Annah BROCKS, MD as Consulting  Physician (Oncology)  I have personally reviewed and noted the following in the patient's chart:   Medical and social history Use of alcohol, tobacco or illicit drugs  Current medications and supplements including opioid prescriptions. Functional ability and status Nutritional status Physical activity Advanced directives List of other physicians Hospitalizations, surgeries, and ER visits in previous 12 months Vitals Screenings to include cognitive, depression, and falls Referrals and appointments  No orders of the defined types were placed in this encounter.  In addition, I have reviewed and discussed with patient certain preventive protocols, quality metrics, and best practice recommendations. A written personalized care plan for preventive services as well as general preventive health recommendations were provided to patient.   Jhonnie GORMAN Das, LPN   [id number]   No follow-ups on file.  After Visit Summary: (MyChart) Due to this being a telephonic visit, the after visit summary with patients personalized plan was offered to patient via MyChart   Nurse  Notes: UTD ON SHOTS, NO COVIDS WANTED; MAMMOGRAM ALREADY ORDERED; UTD ON COLONOSCOPY

## 2024-06-05 ENCOUNTER — Ambulatory Visit (INDEPENDENT_AMBULATORY_CARE_PROVIDER_SITE_OTHER): Admitting: Family Medicine

## 2024-06-05 ENCOUNTER — Encounter: Payer: Self-pay | Admitting: Family Medicine

## 2024-06-05 VITALS — BP 125/75 | HR 69 | Ht 62.0 in | Wt 239.2 lb

## 2024-06-05 DIAGNOSIS — R21 Rash and other nonspecific skin eruption: Secondary | ICD-10-CM

## 2024-06-05 DIAGNOSIS — E1169 Type 2 diabetes mellitus with other specified complication: Secondary | ICD-10-CM

## 2024-06-05 DIAGNOSIS — E785 Hyperlipidemia, unspecified: Secondary | ICD-10-CM

## 2024-06-05 DIAGNOSIS — E1159 Type 2 diabetes mellitus with other circulatory complications: Secondary | ICD-10-CM | POA: Diagnosis not present

## 2024-06-05 DIAGNOSIS — I152 Hypertension secondary to endocrine disorders: Secondary | ICD-10-CM

## 2024-06-05 DIAGNOSIS — E119 Type 2 diabetes mellitus without complications: Secondary | ICD-10-CM

## 2024-06-05 DIAGNOSIS — B372 Candidiasis of skin and nail: Secondary | ICD-10-CM | POA: Diagnosis not present

## 2024-06-05 DIAGNOSIS — Z7984 Long term (current) use of oral hypoglycemic drugs: Secondary | ICD-10-CM | POA: Diagnosis not present

## 2024-06-05 MED ORDER — TRIAMCINOLONE ACETONIDE 0.025 % EX OINT: 1.0000 | TOPICAL_OINTMENT | Freq: Two times a day (BID) | CUTANEOUS | 0 refills | Status: DC

## 2024-06-05 MED ORDER — FLUCONAZOLE 150 MG PO TABS: 150.0000 mg | ORAL_TABLET | ORAL | 0 refills | Status: AC

## 2024-06-05 NOTE — Patient Instructions (Addendum)
 Please print AVS for patient   You can visit the lab without an appointment Mon-Fri between 8A-11:30A or 1P-4:30P. Please come back for labs before the end of the year if you can.     VISIT SUMMARY: You came in today because of a rash in both of your underarm areas that has been bothering you for about a week. We also reviewed your diabetes and hypertension management.  YOUR PLAN: CANDIDAL INTERTRIGO: You have a fungal rash in both of your underarm areas that is itchy and red. -Use the prescribed steroid cream(Triamcinolone  0.1%)  to reduce itching and inflammation. -Take Diflucan  150 mg by mouth once a week for four weeks.  TYPE 2 DIABETES MELLITUS: Your diabetes is well-controlled with a previous A1c of 5.8%. -Get an A1c test. -Get a urine albumin test. -Attend your scheduled diabetes foot exam.  HYPERTENSION ASSOCIATED WITH DIABETES: Your blood pressure was initially high but improved during the visit. -Continue monitoring your blood pressure regularly.  GENERAL HEALTH MAINTENANCE: Routine health maintenance was discussed. -Schedule a bone density screening. -Follow up with a podiatrist for foot care.     To keep you healthy, please keep in mind the following health maintenance items that you are due for:   Health Maintenance Due  Topic Date Due   Bone Density Scan  Never done   FOOT EXAM  04/02/2023   HEMOGLOBIN A1C  11/14/2023   Mammogram  01/22/2024   Diabetic kidney evaluation - Urine ACR  05/16/2024     Best Wishes,   Dr. Lang

## 2024-06-05 NOTE — Progress Notes (Signed)
 ACUTE VISIT   Patient: Kristin Mora   DOB: [birth year]   68 y.o. Female  MRN: [id number]   PCP: Sharma Coyer, MD  Chief Complaint  Patient presents with   Acute Visit   Rash    Bilateral rash under her armpits. Reports previous hx of rash and was provided a cream to treat it. She reports she has been using some athletic foot creme for treatment   Subjective    HPI HPI     Rash    Additional comments: Bilateral rash under her armpits. Reports previous hx of rash and was provided a cream to treat it. She reports she has been using some athletic foot creme for treatment      Last edited by Lilian Fitzpatrick, CMA on 06/05/2024  4:04 PM.       Discussed the use of AI scribe software for clinical note transcription with the patient, who gave verbal consent to proceed.  History of Present Illness Kristin Mora is a 68 year old female with diabetes and chronic hypertension who presents with a rash in both axillary regions.  She has had a rash in both axillary regions for about a week. The rash is itchy, red, and inflamed, but not bleeding. She has experienced a similar rash in the past, which resolved with treatment. She has been using ketoconazole  2% cream and Lamisil  1% cream for treatment.  She has diabetes and chronic hypertension. Her A1c was last checked a year ago and was 5.8.  She does not use deodorant when the rash is present, but typically uses Secret brand. She has used a wash to clean the area and has avoided wearing a bra to reduce irritation, opting to use gauze pads instead.     Medications: Outpatient Medications Prior to Visit  Medication Sig   amLODipine  (NORVASC ) 5 MG tablet Take 1 tablet (5 mg total) by mouth daily.   atorvastatin  (LIPITOR) 80 MG tablet Take 1 tablet by mouth once daily   Blood Pressure Monitoring (BLOOD PRESSURE DIGITAL SOLN) KIT Dispense one automatic/digital blood pressure machine; check blood pressure twice daily  in AM and PM   cloNIDine  (CATAPRES ) 0.1 MG tablet Take 1 tablet (0.1 mg total) by mouth daily.   hydrocortisone  1 % lotion Apply 1 Application topically 2 (two) times daily. For itching   ketoconazole  (NIZORAL ) 2 % cream Apply 1 Application topically daily. On R armpit, under R breast   lactulose  (CHRONULAC ) 10 GM/15ML solution Take 15 mLs (10 g total) by mouth 2 (two) times daily as needed for severe constipation.   lidocaine  (LIDODERM ) 5 % Place 1 patch onto the skin daily. Remove & Discard patch within 12 hours or as directed by MD   metFORMIN  (GLUCOPHAGE ) 500 MG tablet Take 1 tablet (500 mg total) by mouth 2 (two) times daily with a meal.   metoprolol  succinate (TOPROL -XL) 25 MG 24 hr tablet Take 1 tablet (25 mg total) by mouth daily.   oxybutynin  (DITROPAN ) 5 MG tablet Take 1 tablet (5 mg total) by mouth 2 (two) times daily.   Semaglutide , 2 MG/DOSE, (OZEMPIC , 2 MG/DOSE,) 8 MG/3ML SOPN INJECT 2MG   SUBCUTANEOUSLY ONCE A WEEK   terbinafine  (LAMISIL ) 1 % cream Apply 1 Application topically 2 (two) times daily. To affected areas of rash under breast and under arms   valsartan -hydrochlorothiazide  (DIOVAN -HCT) 320-25 MG tablet TAKE 1 TABLET BY MOUTH ONCE DAILY. TAKE IN PLACE OF LOSARTAN -HCTZ   Vitamin D , Ergocalciferol , (DRISDOL ) 1.25  MG (50000 UNIT) CAPS capsule Take 1 capsule by mouth once a week   [DISCONTINUED] triamcinolone  (KENALOG ) 0.025 % ointment Apply 1 Application topically 2 (two) times daily.   No facility-administered medications prior to visit.    Last metabolic panel Lab Results  Component Value Date   GLUCOSE 91 12/25/2023   NA 139 12/25/2023   K 4.0 12/25/2023   CL 103 12/25/2023   CO2 28 12/25/2023   BUN 16 12/25/2023   CREATININE 0.69 12/25/2023   GFRNONAA >60 12/25/2023   CALCIUM  9.3 12/25/2023   PROT 6.9 12/25/2023   ALBUMIN 3.3 (L) 12/25/2023   LABGLOB 3.1 05/17/2023   AGRATIO 1.3 07/08/2022   BILITOT 1.1 12/25/2023   ALKPHOS 51 12/25/2023   AST 19 12/25/2023    ALT 16 12/25/2023   ANIONGAP 8 12/25/2023   Last lipids Lab Results  Component Value Date   CHOL 121 05/17/2023   HDL 36 (L) 05/17/2023   LDLCALC 66 05/17/2023   TRIG 103 05/17/2023   CHOLHDL 3.4 05/17/2023   Last hemoglobin A1c Lab Results  Component Value Date   HGBA1C 5.8 (H) 05/17/2023   Last thyroid functions Lab Results  Component Value Date   TSH 1.200 05/17/2023   Last vitamin D  Lab Results  Component Value Date   VD25OH 35.6 05/17/2023   Last vitamin B12 and Folate Lab Results  Component Value Date   VITAMINB12 263 01/05/2024   FOLATE 9.9 06/09/2023        Objective    BP 125/75 Comment: home reading  Pulse 69   Ht 5' 2 (1.575 m)   Wt 239 lb 3.2 oz (108.5 kg)   SpO2 94%   BMI 43.75 kg/m  BP Readings from Last 3 Encounters:  06/05/24 125/75  05/03/24 (!) 162/95  04/23/24 (!) 150/70   Wt Readings from Last 3 Encounters:  06/05/24 239 lb 3.2 oz (108.5 kg)  04/23/24 235 lb 14.4 oz (107 kg)  01/19/24 234 lb (106.1 kg)      Physical Exam   Physical Exam VITALS: BP- 160/70 EXTREMITIES: Extremities normal. NEUROLOGICAL: Sensation intact in feet. SKIN: Inflamed skin in axillary regions with satellite lesions.   No results found for any visits on 06/05/24.  Assessment & Plan     Problem List Items Addressed This Visit     Diabetes mellitus, type 2 (HCC)   Type 2 diabetes mellitus Chronic  Well-controlled with previous A1c of 5.8%. - Ordered A1c test. - Ordered urine albumin test. - completed diabetes foot exam. -continue ozempic  2mg  weekly and metformin  500mg  BID  - on ARB and statin       Relevant Orders   Hemoglobin A1c   Microalbumin / creatinine urine ratio   Hyperlipidemia associated with type 2 diabetes mellitus (HCC)   Chronic hyperlipidemia associated with diabetes. Currently managed with Lipitor. - Continue Lipitor 8 mg daily      Relevant Orders   Lipid panel   Hypertension associated with diabetes (HCC)    Hypertension associated with diabetes Chronic  Blood pressure initially elevated at 160/70 mmHg, improved to 125/75 mmHg during visit. Hypertension associated with diabetes. - continue metoprolol  25mg  daily  -continue clonidine  0.1mg  daily - continue valsartan -hydrochlorothiazide  320-25mg  daily  -continue amlodipine  5mg  daily       Relevant Orders   CMP14+EGFR   Other Visit Diagnoses       Candidiasis, intertrigo    -  Primary   Relevant Medications   fluconazole  (DIFLUCAN ) 150 MG tablet  triamcinolone  (KENALOG ) 0.025 % ointment     Rash           Assessment & Plan Candidal intertrigo Chronic rash in both axillary regions with itching and redness, present for one week. Previous treatment with topical antifungals. Current presentation suggests fungal intertrigo with satellite lesions. No bleeding reported. - Prescribed steroid cream to reduce itching and inflammation. - Prescribed Diflucan  150 mg orally once a week for four weeks.   General Health Maintenance Routine health maintenance discussed, including bone density screening and diabetes foot care. - Schedule bone density screening. - Encouraged follow-up with podiatrist for foot care.      Return in about 3 months (around 09/03/2024) for Chronic F/U, DM, HTN, Cholesterol.        Rockie Agent, MD  [facility] [phone] (phone) [phone] (fax)  [facility]

## 2024-06-07 ENCOUNTER — Other Ambulatory Visit: Payer: Self-pay

## 2024-06-07 DIAGNOSIS — E538 Deficiency of other specified B group vitamins: Secondary | ICD-10-CM

## 2024-06-08 NOTE — Assessment & Plan Note (Signed)
 Hypertension associated with diabetes Chronic  Blood pressure initially elevated at 160/70 mmHg, improved to 125/75 mmHg during visit. Hypertension associated with diabetes. - continue metoprolol  25mg  daily  -continue clonidine  0.1mg  daily - continue valsartan -hydrochlorothiazide  320-25mg  daily  -continue amlodipine  5mg  daily

## 2024-06-08 NOTE — Assessment & Plan Note (Signed)
 Chronic hyperlipidemia associated with diabetes. Currently managed with Lipitor. - Continue Lipitor 8 mg daily

## 2024-06-08 NOTE — Assessment & Plan Note (Signed)
 Type 2 diabetes mellitus Chronic  Well-controlled with previous A1c of 5.8%. - Ordered A1c test. - Ordered urine albumin test. - completed diabetes foot exam. -continue ozempic  2mg  weekly and metformin  500mg  BID  - on ARB and statin

## 2024-06-10 ENCOUNTER — Inpatient Hospital Stay: Attending: Internal Medicine

## 2024-06-10 ENCOUNTER — Inpatient Hospital Stay

## 2024-06-10 ENCOUNTER — Inpatient Hospital Stay: Admitting: Oncology

## 2024-06-10 ENCOUNTER — Encounter: Payer: Self-pay | Admitting: Oncology

## 2024-07-12 ENCOUNTER — Inpatient Hospital Stay

## 2024-07-12 ENCOUNTER — Inpatient Hospital Stay: Admitting: Nurse Practitioner

## 2024-07-12 ENCOUNTER — Telehealth: Payer: Self-pay | Admitting: Nurse Practitioner

## 2024-07-12 ENCOUNTER — Inpatient Hospital Stay: Attending: Internal Medicine

## 2024-07-12 ENCOUNTER — Telehealth: Payer: Self-pay | Admitting: Family Medicine

## 2024-07-12 NOTE — Telephone Encounter (Signed)
 Pt called to r/s appt from today - pt requested assistance in re-doing mychart - walked pt through steps, deactivated acct, and sent pt new code to set up her mychart - gave pt my number to call back if she is not successful in setting it up - [employer]

## 2024-07-12 NOTE — Telephone Encounter (Signed)
 Copied from CRM [id number]. Topic: General - Call Back - No Documentation >> Jul 12, 2024 12:23 PM Rea C wrote: Reason for CRM: Patient is asking if the dementia medication on her file can be removed from her chart. She said she never took the medicine but it being on her chart is preventing her from getting life insurance. Patient said she doesn't remember the name of the prescription for dementia. She never took it. Patient will appreciate a callback.   [phone] (M)

## 2024-07-16 ENCOUNTER — Inpatient Hospital Stay

## 2024-07-16 ENCOUNTER — Inpatient Hospital Stay: Admitting: Nurse Practitioner

## 2024-07-17 ENCOUNTER — Other Ambulatory Visit: Payer: Self-pay | Admitting: Family Medicine

## 2024-07-17 DIAGNOSIS — E119 Type 2 diabetes mellitus without complications: Secondary | ICD-10-CM

## 2024-07-17 NOTE — Telephone Encounter (Signed)
 Of the medications on her current list, she is not prescribed anything for dementia.

## 2024-07-18 NOTE — Telephone Encounter (Signed)
 Will complete requested letter once pt is able to confirm which medication is in question

## 2024-07-19 NOTE — Telephone Encounter (Signed)
 Left Detailed voicemail per DPR  E2C2- If call is returned please advise per provider

## 2024-07-19 NOTE — Progress Notes (Signed)
 Kristin Mora                                          MRN: [id number]   07/19/2024   The [facility] Quality Team Specialist reviewed this patient medical record for the purposes of chart review for care gap closure. The following were reviewed: chart review for care gap closure-glycemic status assessment.    [facility] Quality Team

## 2024-07-23 ENCOUNTER — Telehealth: Payer: Self-pay | Admitting: Oncology

## 2024-07-23 NOTE — Telephone Encounter (Signed)
 Patient called to reschedule appointment for 1/30- a she requested 2/6. Appointment rescheduled as requested.

## 2024-07-25 ENCOUNTER — Telehealth: Payer: Self-pay | Admitting: Family Medicine

## 2024-07-25 MED ORDER — METOPROLOL SUCCINATE ER 25 MG PO TB24: 25.0000 mg | ORAL_TABLET | Freq: Every day | ORAL | 1 refills | Status: AC

## 2024-07-25 NOTE — Telephone Encounter (Signed)
[facility] faxed refill request for the following medications:  metoprolol succinate (TOPROL-XL) 25 MG 24 hr tablet    Please advise.

## 2024-07-26 ENCOUNTER — Inpatient Hospital Stay

## 2024-07-26 ENCOUNTER — Inpatient Hospital Stay: Admitting: Nurse Practitioner

## 2024-08-02 ENCOUNTER — Inpatient Hospital Stay

## 2024-08-02 ENCOUNTER — Encounter: Payer: Self-pay | Admitting: Nurse Practitioner

## 2024-08-02 ENCOUNTER — Inpatient Hospital Stay: Admitting: Nurse Practitioner

## 2024-08-02 ENCOUNTER — Emergency Department
Admission: EM | Admit: 2024-08-02 | Discharge: 2024-08-02 | Discharge status: Against Medical Advice | Source: Home / Self Care

## 2024-08-02 ENCOUNTER — Inpatient Hospital Stay: Attending: Internal Medicine

## 2024-08-02 VITALS — BP 206/104 | HR 74 | Resp 18 | Ht 62.0 in | Wt 245.0 lb

## 2024-08-02 DIAGNOSIS — I160 Hypertensive urgency: Secondary | ICD-10-CM

## 2024-08-02 DIAGNOSIS — E538 Deficiency of other specified B group vitamins: Secondary | ICD-10-CM

## 2024-08-02 LAB — CBC WITH DIFFERENTIAL (CANCER CENTER ONLY)
Abs Immature Granulocytes: 0.05 10*3/uL (ref 0.00–0.07)
Basophils Absolute: 0.1 10*3/uL (ref 0.0–0.1)
Basophils Relative: 1 %
Eosinophils Absolute: 0.3 10*3/uL (ref 0.0–0.5)
Eosinophils Relative: 3 %
HCT: 46 % (ref 36.0–46.0)
Hemoglobin: 14.9 g/dL (ref 12.0–15.0)
Immature Granulocytes: 1 %
Lymphocytes Relative: 27 %
Lymphs Abs: 2.7 10*3/uL (ref 0.7–4.0)
MCH: 29.1 pg (ref 26.0–34.0)
MCHC: 32.4 g/dL (ref 30.0–36.0)
MCV: 89.8 fL (ref 80.0–100.0)
Monocytes Absolute: 0.7 10*3/uL (ref 0.1–1.0)
Monocytes Relative: 7 %
Neutro Abs: 6.4 10*3/uL (ref 1.7–7.7)
Neutrophils Relative %: 61 %
Platelet Count: 241 10*3/uL (ref 150–400)
RBC: 5.12 MIL/uL — ABNORMAL HIGH (ref 3.87–5.11)
RDW: 15.5 % (ref 11.5–15.5)
WBC Count: 10.2 10*3/uL (ref 4.0–10.5)
nRBC: 0 % (ref 0.0–0.2)

## 2024-08-02 LAB — VITAMIN B12: Vitamin B-12: 644 pg/mL (ref 180–914)

## 2024-08-02 LAB — FOLATE: Folate: 6.9 ng/mL

## 2024-08-02 MED ORDER — CYANOCOBALAMIN 1000 MCG/ML IJ SOLN
1000.0000 ug | Freq: Once | INTRAMUSCULAR | Status: AC
  Administered 2024-08-02: 1000 ug via INTRAMUSCULAR
  Filled 2024-08-02: qty 1

## 2024-08-02 NOTE — Addendum Note (Signed)
 Addended by: BETHENE ALVIN RAMAN on: 08/02/2024 03:24 PM   Modules accepted: Orders

## 2024-08-02 NOTE — Progress Notes (Signed)
 " [facility]  Telephone:(336) [phone] Fax:(336) [phone]  ID: Kristin Mora Kitty OB: 1955-10-25  MR#: [id number]  RDW#:[id number]  Patient Care Team: Sharma Coyer, MD as PCP - General (Family Medicine) Darliss Rogue, MD as PCP - Cardiology (Cardiology) Laurice Francis NOV[place], [place] (Optometry) Melanee Annah BROCKS, MD as Consulting Physician (Oncology)  Reason for visit: Vitamin B12 deficiency  HPI: Kristin Mora is a 69 y.o. female with past medical history of asthma, brain aneurysm, diabetes, hypertension, hyperlipidemia, stroke referred to hematology for management of severe vitamin B12 deficiency.  Patient was seen by primary care on 05/17/2023.  Concern for cognitive impairment was expressed by the family.  As a part of workup, vitamin B12 was checked which was less than 50.  No prior vitamin B12 level.  Folate 6.7.  Denies any neuropathy.  Denies any prior gastric surgeries or small bowel surgery. Denies gluten insensitivity. Denies any shortness of breath, chest pain.  Interval history- Kristin Mora is a 69 y.o. female with above history of b12 deficiency, previously managed by Dr Clista, who returns to clinic for follow up. She reports mixed compliance with medications. Says her daughter makes pill boxes for her and only takes medications once a day. Last was yesterday at 6 pm. She denies chest pain, shortness of breath, or headache. She has not been receiving her b12 injections recently d/t weather. Last in November 2025.   REVIEW OF SYSTEMS:   Review of Systems  Constitutional:  Negative for chills, fever, malaise/fatigue and weight loss.  HENT:  Negative for hearing loss, nosebleeds, sore throat and tinnitus.   Eyes:  Negative for blurred vision and double vision.  Respiratory:  Negative for cough, hemoptysis, shortness of breath and wheezing.   Cardiovascular:  Negative for chest pain, palpitations and leg swelling.  Gastrointestinal:  Negative for  abdominal pain, blood in stool, constipation, diarrhea, melena, nausea and vomiting.  Genitourinary:  Negative for dysuria and urgency.  Musculoskeletal:  Negative for back pain, falls, joint pain and myalgias.  Skin:  Negative for itching and rash.  Neurological:  Negative for dizziness, tingling, sensory change, loss of consciousness, weakness and headaches.  Endo/Heme/Allergies:  Negative for environmental allergies. Does not bruise/bleed easily.  Psychiatric/Behavioral:  Negative for depression. The patient is not nervous/anxious and does not have insomnia.   As per HPI. Otherwise, a complete review of systems is negative.  PAST MEDICAL HISTORY: Past Medical History:  Diagnosis Date   Asthma    Brain aneurysm 04/2007   Diabetes mellitus without complication (HCC)    controlled;   HLD (hyperlipidemia)    Hypertension    controlled with medication   Stroke [facility])    Vertigo     PAST SURGICAL HISTORY: Past Surgical History:  Procedure Laterality Date   COLONOSCOPY WITH PROPOFOL  N/A 01/19/2022   Procedure: COLONOSCOPY WITH PROPOFOL ;  Surgeon: Therisa Bi, MD;  Location: [facility] ENDOSCOPY;  Service: Gastroenterology;  Laterality: N/A;   CRANIOTOMY  04/28/2007   KNEE SURGERY     TONSILLECTOMY     TONSILLECTOMY AND ADENOIDECTOMY     TUBAL LIGATION      FAMILY HISTORY: Family History  Problem Relation Age of Onset   Heart attack Mother    Coronary artery disease Mother    Lung cancer Father    Diabetes Sister    Heart Problems Sister    Uterine cancer Sister    Lung cancer Sister    Stroke Brother    Bone cancer Brother  HEALTH MAINTENANCE: Social History   Tobacco Use   Smoking status: Former    Current packs/day: 0.00    Average packs/day: 2.0 packs/day for 12.0 years (24.0 ttl pk-yrs)    Types: Cigarettes    Start date: 11/25/1997    Quit date: 11/25/2009    Years since quitting: 14.6   Smokeless tobacco: Never  Vaping Use   Vaping status: Never Used  Substance  Use Topics   Alcohol use: No    Alcohol/week: 0.0 standard drinks of alcohol   Drug use: No    Allergies  Allergen Reactions   Lisinopril Swelling   Latex Rash    Current Outpatient Medications  Medication Sig Dispense Refill   amLODipine  (NORVASC ) 5 MG tablet Take 1 tablet (5 mg total) by mouth daily. 90 tablet 1   atorvastatin  (LIPITOR) 80 MG tablet Take 1 tablet by mouth once daily 90 tablet 0   Blood Pressure Monitoring (BLOOD PRESSURE DIGITAL SOLN) KIT Dispense one automatic/digital blood pressure machine; check blood pressure twice daily in AM and PM 1 kit 0   cloNIDine  (CATAPRES ) 0.1 MG tablet Take 1 tablet (0.1 mg total) by mouth daily. 60 tablet 3   hydrocortisone  1 % lotion Apply 1 Application topically 2 (two) times daily. For itching 118 mL 0   ketoconazole  (NIZORAL ) 2 % cream Apply 1 Application topically daily. On R armpit, under R breast 30 g 2   lactulose  (CHRONULAC ) 10 GM/15ML solution Take 15 mLs (10 g total) by mouth 2 (two) times daily as needed for severe constipation. 236 mL 0   lidocaine  (LIDODERM ) 5 % Place 1 patch onto the skin daily. Remove & Discard patch within 12 hours or as directed by MD 30 patch 0   metFORMIN  (GLUCOPHAGE ) 500 MG tablet TAKE 1 TABLET BY MOUTH TWICE DAILY WITH A MEAL 180 tablet 0   metoprolol  succinate (TOPROL -XL) 25 MG 24 hr tablet Take 1 tablet (25 mg total) by mouth daily. 90 tablet 1   Semaglutide , 2 MG/DOSE, (OZEMPIC , 2 MG/DOSE,) 8 MG/3ML SOPN INJECT 2MG   SUBCUTANEOUSLY ONCE A WEEK 9 mL 2   terbinafine  (LAMISIL ) 1 % cream Apply 1 Application topically 2 (two) times daily. To affected areas of rash under breast and under arms 30 g 2   valsartan -hydrochlorothiazide  (DIOVAN -HCT) 320-25 MG tablet TAKE 1 TABLET BY MOUTH ONCE DAILY. TAKE IN PLACE OF LOSARTAN -HCTZ 90 tablet 3   Vitamin D , Ergocalciferol , (DRISDOL ) 1.25 MG (50000 UNIT) CAPS capsule Take 1 capsule by mouth once a week 12 capsule 0   No current facility-administered medications  for this visit.    OBJECTIVE: Vitals:   08/02/24 1405 08/02/24 1415  BP: (!) 197/89 (!) 206/104  Pulse: 74   Resp: 18    Body mass index is 44.81 kg/m.      Physical Exam Vitals reviewed.  Constitutional:      Appearance: She is obese. She is not ill-appearing.  HENT:     Head: Normocephalic.  Cardiovascular:     Rate and Rhythm: Normal rate and regular rhythm.  Pulmonary:     Effort: Pulmonary effort is normal. No respiratory distress.  Abdominal:     Tenderness: There is no abdominal tenderness. There is no guarding.  Musculoskeletal:        General: No swelling or deformity.  Skin:    General: Skin is warm and dry.  Neurological:     Mental Status: She is alert and oriented to person, place, and time.  Psychiatric:  Mood and Affect: Mood normal.        Behavior: Behavior normal.     LAB RESULTS: Lab Results  Component Value Date   WBC 10.2 08/02/2024   NEUTROABS 6.4 08/02/2024   HGB 14.9 08/02/2024   HCT 46.0 08/02/2024   MCV 89.8 08/02/2024   PLT 241 08/02/2024   Lab Results  Component Value Date   VITAMINB12 263 01/05/2024   No results found for: TIBC, FERRITIN, IRONPCTSAT  STUDIES: No results found.  ASSESSMENT AND PLAN:   Kristin Mora is a 69 y.o. female with pmh of asthma, brain aneurysm, diabetes, hypertension, hyperlipidemia, stroke referred to hematology for management of severe vitamin B12 deficiency.  # Severe vitamin B12 deficiency - Likely nutritional.  Intrinsic factor antibody and antiparietal antibodies negative.  Denies gluten sensitivity. Celiac panel July 2025 was negative.  - She has continued oral b12 supplements 1000 mcg daily. And weekly IM B12 x 4, now on monthly injections of 1000 mcg. Last B12 on 05/03/24  - Today's level pending. Plan to continue monthly injections  # Uncontrolled severe hypertension - BP 227/89 on recheck. Mixed compliance with medications due to history of vascular dementia. Per patient,  last took medications yesterday and not due till tonight. Asymptomatic. However, high vascular risk in setting of hx of stroke and diabetes. Discussed with Dr Melanee who recommends evaluation at ER. Patient and her daughter Darice are in agreement. Called ER.   Disposition:  B12 injection today then monthly 6 mo- lab only (cbc, b12) 12 mo- lab (cbc, b12), Dr Melanee, +/- b12- la  No orders of the defined types were placed in this encounter.  Patient expressed understanding and was in agreement with this plan. She also understands that She can call clinic at any time with any questions, concerns, or complaints.   I spent a total of 30 minutes reviewing chart data, face-to-face evaluation with the patient, counseling and coordination of care as detailed above.  Tinnie KANDICE Dawn, NP    08/02/2024      "

## 2024-08-30 ENCOUNTER — Inpatient Hospital Stay

## 2024-09-05 ENCOUNTER — Ambulatory Visit: Admitting: Family Medicine

## 2024-09-30 ENCOUNTER — Inpatient Hospital Stay

## 2024-10-30 ENCOUNTER — Inpatient Hospital Stay

## 2024-11-29 ENCOUNTER — Inpatient Hospital Stay

## 2025-01-03 ENCOUNTER — Inpatient Hospital Stay

## 2025-01-31 ENCOUNTER — Inpatient Hospital Stay

## 2025-06-03 ENCOUNTER — Ambulatory Visit

## 2025-08-08 ENCOUNTER — Inpatient Hospital Stay

## 2025-08-08 ENCOUNTER — Inpatient Hospital Stay: Admitting: Oncology
